# Patient Record
Sex: Male | Born: 1935 | Race: White | Hispanic: No | Smoking: Former smoker
Health system: Southern US, Community
[De-identification: ages and names within clinical notes are randomized; demographics above are authoritative.]

## PROBLEM LIST (undated history)

## (undated) DIAGNOSIS — I4891 Unspecified atrial fibrillation: Secondary | ICD-10-CM

## (undated) DIAGNOSIS — K219 Gastro-esophageal reflux disease without esophagitis: Secondary | ICD-10-CM

## (undated) DIAGNOSIS — D099 Carcinoma in situ, unspecified: Secondary | ICD-10-CM

## (undated) DIAGNOSIS — E669 Obesity, unspecified: Secondary | ICD-10-CM

## (undated) DIAGNOSIS — I10 Essential (primary) hypertension: Secondary | ICD-10-CM

## (undated) DIAGNOSIS — N189 Chronic kidney disease, unspecified: Secondary | ICD-10-CM

## (undated) DIAGNOSIS — M1990 Unspecified osteoarthritis, unspecified site: Secondary | ICD-10-CM

## (undated) DIAGNOSIS — K5790 Diverticulosis of intestine, part unspecified, without perforation or abscess without bleeding: Secondary | ICD-10-CM

## (undated) DIAGNOSIS — I519 Heart disease, unspecified: Secondary | ICD-10-CM

## (undated) DIAGNOSIS — N529 Male erectile dysfunction, unspecified: Secondary | ICD-10-CM

## (undated) DIAGNOSIS — I2510 Atherosclerotic heart disease of native coronary artery without angina pectoris: Secondary | ICD-10-CM

## (undated) HISTORY — DX: Gastro-esophageal reflux disease without esophagitis: K21.9

## (undated) HISTORY — DX: Obesity, unspecified: E66.9

## (undated) HISTORY — DX: Carcinoma in situ, unspecified: D09.9

## (undated) HISTORY — DX: Chronic kidney disease, unspecified: N18.9

## (undated) HISTORY — DX: Male erectile dysfunction, unspecified: N52.9

## (undated) HISTORY — DX: Unspecified atrial fibrillation: I48.91

## (undated) HISTORY — PX: TONSILLECTOMY: SUR1361

## (undated) HISTORY — DX: Atherosclerotic heart disease of native coronary artery without angina pectoris: I25.10

## (undated) HISTORY — DX: Diverticulosis of intestine, part unspecified, without perforation or abscess without bleeding: K57.90

## (undated) HISTORY — DX: Heart disease, unspecified: I51.9

## (undated) HISTORY — DX: Essential (primary) hypertension: I10

---

## 1998-02-19 ENCOUNTER — Ambulatory Visit (HOSPITAL_COMMUNITY): Admission: RE | Admit: 1998-02-19 | Discharge: 1998-02-19 | Payer: Self-pay | Admitting: Gastroenterology

## 2000-07-11 ENCOUNTER — Inpatient Hospital Stay (HOSPITAL_COMMUNITY): Admission: EM | Admit: 2000-07-11 | Discharge: 2000-07-15 | Payer: Self-pay | Admitting: Emergency Medicine

## 2000-07-11 ENCOUNTER — Encounter: Payer: Self-pay | Admitting: Emergency Medicine

## 2000-07-18 ENCOUNTER — Ambulatory Visit (HOSPITAL_COMMUNITY): Admission: RE | Admit: 2000-07-18 | Discharge: 2000-07-18 | Payer: Self-pay | Admitting: Cardiology

## 2000-07-18 ENCOUNTER — Encounter: Payer: Self-pay | Admitting: Cardiology

## 2000-08-07 ENCOUNTER — Ambulatory Visit (HOSPITAL_COMMUNITY): Admission: RE | Admit: 2000-08-07 | Discharge: 2000-08-07 | Payer: Self-pay | Admitting: Cardiology

## 2000-08-07 ENCOUNTER — Encounter: Payer: Self-pay | Admitting: Cardiology

## 2000-10-26 ENCOUNTER — Ambulatory Visit (HOSPITAL_COMMUNITY): Admission: RE | Admit: 2000-10-26 | Discharge: 2000-10-26 | Payer: Self-pay | Admitting: Internal Medicine

## 2002-01-17 ENCOUNTER — Encounter: Admission: RE | Admit: 2002-01-17 | Discharge: 2002-04-17 | Payer: Self-pay | Admitting: Family Medicine

## 2002-12-17 ENCOUNTER — Ambulatory Visit (HOSPITAL_COMMUNITY): Admission: RE | Admit: 2002-12-17 | Discharge: 2002-12-17 | Payer: Self-pay | Admitting: Internal Medicine

## 2003-01-05 ENCOUNTER — Inpatient Hospital Stay (HOSPITAL_COMMUNITY): Admission: EM | Admit: 2003-01-05 | Discharge: 2003-01-06 | Payer: Self-pay | Admitting: Emergency Medicine

## 2003-01-05 ENCOUNTER — Encounter: Payer: Self-pay | Admitting: Emergency Medicine

## 2003-01-09 ENCOUNTER — Ambulatory Visit (HOSPITAL_COMMUNITY): Admission: RE | Admit: 2003-01-09 | Discharge: 2003-01-10 | Payer: Self-pay | Admitting: Internal Medicine

## 2003-01-09 HISTORY — PX: PACEMAKER INSERTION: SHX728

## 2003-01-10 ENCOUNTER — Encounter: Payer: Self-pay | Admitting: Internal Medicine

## 2004-08-06 ENCOUNTER — Ambulatory Visit: Payer: Self-pay

## 2004-11-03 ENCOUNTER — Ambulatory Visit: Payer: Self-pay | Admitting: Internal Medicine

## 2005-01-18 ENCOUNTER — Ambulatory Visit: Payer: Self-pay | Admitting: Internal Medicine

## 2005-04-25 ENCOUNTER — Ambulatory Visit: Payer: Self-pay | Admitting: Internal Medicine

## 2005-07-22 ENCOUNTER — Ambulatory Visit: Payer: Self-pay | Admitting: Internal Medicine

## 2005-11-09 ENCOUNTER — Ambulatory Visit: Payer: Self-pay | Admitting: Internal Medicine

## 2006-01-24 ENCOUNTER — Ambulatory Visit: Payer: Self-pay | Admitting: Internal Medicine

## 2006-02-22 ENCOUNTER — Ambulatory Visit: Payer: Self-pay | Admitting: Internal Medicine

## 2006-03-31 ENCOUNTER — Ambulatory Visit: Payer: Self-pay | Admitting: Internal Medicine

## 2006-05-19 ENCOUNTER — Ambulatory Visit: Payer: Self-pay | Admitting: Internal Medicine

## 2006-05-25 ENCOUNTER — Ambulatory Visit: Payer: Self-pay | Admitting: Family Medicine

## 2006-06-19 ENCOUNTER — Ambulatory Visit: Payer: Self-pay | Admitting: Internal Medicine

## 2006-06-29 ENCOUNTER — Ambulatory Visit: Payer: Self-pay | Admitting: Family Medicine

## 2006-08-14 ENCOUNTER — Ambulatory Visit: Payer: Self-pay | Admitting: Internal Medicine

## 2006-08-29 ENCOUNTER — Ambulatory Visit: Payer: Self-pay | Admitting: Family Medicine

## 2006-09-11 ENCOUNTER — Ambulatory Visit: Payer: Self-pay | Admitting: Internal Medicine

## 2006-10-09 ENCOUNTER — Ambulatory Visit: Payer: Self-pay | Admitting: Internal Medicine

## 2006-11-06 ENCOUNTER — Ambulatory Visit: Payer: Self-pay | Admitting: Internal Medicine

## 2006-12-05 ENCOUNTER — Ambulatory Visit: Payer: Self-pay | Admitting: Internal Medicine

## 2007-01-01 ENCOUNTER — Ambulatory Visit: Payer: Self-pay | Admitting: Internal Medicine

## 2007-01-29 ENCOUNTER — Ambulatory Visit: Payer: Self-pay | Admitting: Internal Medicine

## 2007-02-26 ENCOUNTER — Ambulatory Visit: Payer: Self-pay | Admitting: Internal Medicine

## 2007-03-26 ENCOUNTER — Ambulatory Visit: Payer: Self-pay | Admitting: Internal Medicine

## 2007-04-26 ENCOUNTER — Ambulatory Visit: Payer: Self-pay | Admitting: Internal Medicine

## 2007-05-21 ENCOUNTER — Ambulatory Visit: Payer: Self-pay | Admitting: Internal Medicine

## 2007-06-18 ENCOUNTER — Ambulatory Visit: Payer: Self-pay | Admitting: Internal Medicine

## 2007-07-18 ENCOUNTER — Ambulatory Visit: Payer: Self-pay | Admitting: Internal Medicine

## 2007-08-08 ENCOUNTER — Ambulatory Visit: Payer: Self-pay | Admitting: Family Medicine

## 2007-08-13 ENCOUNTER — Ambulatory Visit: Payer: Self-pay | Admitting: Internal Medicine

## 2007-09-09 ENCOUNTER — Ambulatory Visit: Payer: Self-pay | Admitting: Internal Medicine

## 2007-10-08 ENCOUNTER — Ambulatory Visit: Payer: Self-pay | Admitting: Internal Medicine

## 2007-11-06 ENCOUNTER — Ambulatory Visit: Payer: Self-pay | Admitting: Internal Medicine

## 2008-01-07 ENCOUNTER — Ambulatory Visit: Payer: Self-pay | Admitting: Internal Medicine

## 2008-04-07 ENCOUNTER — Ambulatory Visit: Payer: Self-pay | Admitting: Internal Medicine

## 2008-06-11 ENCOUNTER — Ambulatory Visit: Payer: Self-pay | Admitting: Family Medicine

## 2008-07-08 ENCOUNTER — Ambulatory Visit: Payer: Self-pay | Admitting: Internal Medicine

## 2008-09-10 ENCOUNTER — Ambulatory Visit: Payer: Self-pay | Admitting: Family Medicine

## 2008-09-22 ENCOUNTER — Ambulatory Visit: Payer: Self-pay | Admitting: Family Medicine

## 2008-09-30 ENCOUNTER — Ambulatory Visit: Payer: Self-pay | Admitting: Family Medicine

## 2008-10-21 ENCOUNTER — Encounter: Payer: Self-pay | Admitting: Internal Medicine

## 2008-11-04 ENCOUNTER — Ambulatory Visit: Payer: Self-pay | Admitting: Internal Medicine

## 2008-12-22 ENCOUNTER — Ambulatory Visit: Payer: Self-pay | Admitting: Family Medicine

## 2008-12-24 ENCOUNTER — Ambulatory Visit: Payer: Self-pay | Admitting: Family Medicine

## 2008-12-26 DIAGNOSIS — R55 Syncope and collapse: Secondary | ICD-10-CM

## 2008-12-26 DIAGNOSIS — I498 Other specified cardiac arrhythmias: Secondary | ICD-10-CM | POA: Insufficient documentation

## 2008-12-26 DIAGNOSIS — Z950 Presence of cardiac pacemaker: Secondary | ICD-10-CM

## 2009-02-02 ENCOUNTER — Ambulatory Visit: Payer: Self-pay | Admitting: Family Medicine

## 2009-02-03 ENCOUNTER — Ambulatory Visit: Payer: Self-pay | Admitting: Internal Medicine

## 2009-04-07 ENCOUNTER — Ambulatory Visit: Payer: Self-pay | Admitting: Family Medicine

## 2009-04-13 ENCOUNTER — Encounter: Payer: Self-pay | Admitting: Internal Medicine

## 2009-04-13 ENCOUNTER — Ambulatory Visit: Payer: Self-pay

## 2009-06-02 ENCOUNTER — Ambulatory Visit: Payer: Self-pay | Admitting: Internal Medicine

## 2009-06-30 ENCOUNTER — Ambulatory Visit: Payer: Self-pay | Admitting: Internal Medicine

## 2009-07-28 ENCOUNTER — Ambulatory Visit: Payer: Self-pay | Admitting: Internal Medicine

## 2009-09-01 ENCOUNTER — Ambulatory Visit: Payer: Self-pay | Admitting: Internal Medicine

## 2009-09-29 ENCOUNTER — Ambulatory Visit: Payer: Self-pay | Admitting: Internal Medicine

## 2009-10-19 ENCOUNTER — Ambulatory Visit: Payer: Self-pay | Admitting: Internal Medicine

## 2009-11-02 ENCOUNTER — Encounter: Payer: Self-pay | Admitting: Internal Medicine

## 2009-11-05 ENCOUNTER — Telehealth (INDEPENDENT_AMBULATORY_CARE_PROVIDER_SITE_OTHER): Payer: Self-pay | Admitting: *Deleted

## 2009-11-09 ENCOUNTER — Telehealth: Payer: Self-pay | Admitting: Internal Medicine

## 2009-11-27 ENCOUNTER — Ambulatory Visit: Payer: Self-pay | Admitting: Internal Medicine

## 2009-11-30 LAB — CONVERTED CEMR LAB
BUN: 19 mg/dL (ref 6–23)
Basophils Absolute: 0 10*3/uL (ref 0.0–0.1)
Basophils Relative: 0.8 % (ref 0.0–3.0)
CO2: 30 meq/L (ref 19–32)
Calcium: 8.9 mg/dL (ref 8.4–10.5)
Chloride: 107 meq/L (ref 96–112)
Creatinine, Ser: 1.6 mg/dL — ABNORMAL HIGH (ref 0.4–1.5)
Eosinophils Absolute: 0.6 10*3/uL (ref 0.0–0.7)
Eosinophils Relative: 11.8 % — ABNORMAL HIGH (ref 0.0–5.0)
GFR calc non Af Amer: 44.79 mL/min (ref >60)
Glucose, Bld: 90 mg/dL (ref 70–99)
HCT: 43.2 % (ref 39.0–52.0)
Hemoglobin: 14.2 g/dL (ref 13.0–17.0)
INR: 2.7 — ABNORMAL HIGH (ref 0.8–1.0)
Lymphocytes Relative: 25.5 % (ref 12.0–46.0)
Lymphs Abs: 1.3 10*3/uL (ref 0.7–4.0)
MCHC: 32.8 g/dL (ref 30.0–36.0)
MCV: 97 fL (ref 78.0–100.0)
Monocytes Absolute: 0.5 10*3/uL (ref 0.1–1.0)
Monocytes Relative: 9.8 % (ref 3.0–12.0)
Neutro Abs: 2.7 10*3/uL (ref 1.4–7.7)
Neutrophils Relative %: 52.1 % (ref 43.0–77.0)
Platelets: 134 10*3/uL — ABNORMAL LOW (ref 150.0–400.0)
Potassium: 4 meq/L (ref 3.5–5.1)
Prothrombin Time: 27.3 s — ABNORMAL HIGH (ref 9.1–11.7)
RBC: 4.45 M/uL (ref 4.22–5.81)
RDW: 13.3 % (ref 11.5–14.6)
Sodium: 143 meq/L (ref 135–145)
WBC: 5.1 10*3/uL (ref 4.5–10.5)
aPTT: 36.8 s — ABNORMAL HIGH (ref 21.7–28.8)

## 2009-12-04 ENCOUNTER — Ambulatory Visit: Payer: Self-pay | Admitting: Internal Medicine

## 2009-12-04 ENCOUNTER — Ambulatory Visit (HOSPITAL_COMMUNITY): Admission: RE | Admit: 2009-12-04 | Discharge: 2009-12-04 | Payer: Self-pay | Admitting: Internal Medicine

## 2009-12-07 ENCOUNTER — Encounter: Payer: Self-pay | Admitting: Internal Medicine

## 2009-12-23 ENCOUNTER — Encounter: Payer: Self-pay | Admitting: Internal Medicine

## 2009-12-23 ENCOUNTER — Ambulatory Visit: Payer: Self-pay

## 2010-03-16 ENCOUNTER — Ambulatory Visit: Payer: Self-pay | Admitting: Internal Medicine

## 2010-05-21 ENCOUNTER — Ambulatory Visit: Payer: Self-pay | Admitting: Family Medicine

## 2010-06-01 ENCOUNTER — Ambulatory Visit: Payer: Self-pay | Admitting: Family Medicine

## 2010-07-09 ENCOUNTER — Encounter (INDEPENDENT_AMBULATORY_CARE_PROVIDER_SITE_OTHER): Payer: Self-pay | Admitting: *Deleted

## 2010-10-12 NOTE — Miscellaneous (Signed)
Summary: dx code correction  Clinical Lists Changes  Problems: Changed problem from PACEMAKER (ICD-V45.Marland Kitchen01) to PACEMAKER, PERMANENT (ICD-V45.01)  changed the incorrect dx code to correct dx code Genella Mech  July 09, 2010 10:57 AM

## 2010-10-12 NOTE — Procedures (Signed)
Summary: eph./ gd   Current Medications (verified): 1)  Diltiazem Hcl Er Beads 120 Mg Xr24h-Cap (Diltiazem Hcl Er Beads) .... Take One Capsule By Mouth Twice A Day 2)  Benazepril-Hydrochlorothiazide 20-12.5 Mg Tabs (Benazepril-Hydrochlorothiazide) .... Take 1 Tablet By Mouth Once A Day 3)  Warfarin Sodium 5 Mg Tabs (Warfarin Sodium) .... Use As Directed By Anticoagulation Clinic 4)  Allopurinol 300 Mg Tabs (Allopurinol) .... Take 1 Tablet By Mouth Once A Day As Needed Gout 5)  Doxazosin Mesylate 4 Mg Tabs (Doxazosin Mesylate) .... Take 1 Tablet By Mouth Once A Day 6)  Travatan 0.004 % Soln (Travoprost) .Marland Kitchen.. 1 Drop in Each Eye At Night 7)  Alphagan P 0.15 % Soln (Brimonidine Tartrate) .Marland Kitchen.. 1 Drop in Left Eye Twice Daily 8)  Simvastatin 20 Mg Tabs (Simvastatin) .... Take One Tablet By Mouth Daily At Bedtime 9)  Multivitamins   Tabs (Multiple Vitamin) .... Take 1 Tablet By Mouth Once A Day 10)  Vitamin D 2000 Unit Tabs (Cholecalciferol) .Marland Kitchen.. 1 Tab Once Daily  Allergies (verified): No Known Drug Allergies   PPM Specifications Following MD:  Lewayne Bunting, MD     Referring MD:  LITTLE PPM Vendor:  St Jude     PPM Model Number:  [id number]     PPM Serial Number:  [id number] PPM DOI:  12/04/2009     PPM Implanting MD:  Lewayne Bunting, MD  Lead 1    Location: RV     DOI: 01/09/2003     Model #: 1488TC     Serial #: [id number]     Status: active  Magnet Response Rate:  BOL98.6 ERI  86.3  Indications:  Syncope: A-fib  Explantation Comments:  12/04/09 St. Jude [id number] explanted.  PPM Follow Up Remote Check?  No Battery Voltage:  3.04 V     Battery Est. Longevity:  13 years     Pacer Dependent:  No     Right Ventricle  Amplitude: 12 mV, Impedance: 540 ohms, Threshold: 1.0 V at 0.4 msec  Episodes Coumadin:  Yes Ventricular Pacing:  51%  Parameters Mode:  VVI     Lower Rate Limit:  50     Next Cardiology Appt Due:  03/12/2010 Tech Comments:  Auto capture programmed on.  Steri strips removed.   No  redness or edema noted.  ROV 3months with Dr. Ladona Ridgel. Altha Harm, LPN  December 23, 2009 10:19 AM  MD Comments:  Agree with above.

## 2010-10-12 NOTE — Progress Notes (Signed)
Summary: req to speak to nurse  Phone Note Call from Patient Call back at [phone]   Caller: Patient Reason for Call: Talk to Nurse Summary of Call: request to speak to nurse Initial call taken by: Migdalia Dk,  November 09, 2009 9:52 AM  Follow-up for Phone Call        requested a copy of EKG to be faxed to Dr Clarene Duke.  Will have it faxed today for his apt 11/23/09 Dennis Bast, RN, BSN  November 09, 2009 10:05 AM

## 2010-10-12 NOTE — Assessment & Plan Note (Signed)
Summary: pc2   Visit Type:  Follow-up Primary Provider:  Sharlot Gowda, md   History of Present Illness: [name] returns today for followup.  He is very pleasant male with chronic atrial fibrillation, chronic Coumadin therapy, and syncope who was found to have bradycardia and with permanent pacemaker insertion back in 2004.  He has had no recurrent syncope since his pacemaker was placed.  The patient denies chest pain or shortness of breath and overall has been stable.  He is s/p PPM generator change.   Current Medications (verified): 1)  Diltiazem Hcl Er Beads 120 Mg Xr24h-Cap (Diltiazem Hcl Er Beads) .... Take One Capsule By Mouth Twice A Day 2)  Benazepril-Hydrochlorothiazide 20-12.5 Mg Tabs (Benazepril-Hydrochlorothiazide) .... Take 1 Tablet By Mouth Once A Day 3)  Warfarin Sodium 5 Mg Tabs (Warfarin Sodium) .... Use As Directed By Anticoagulation Clinic 4)  Allopurinol 300 Mg Tabs (Allopurinol) .... Take 1 Tablet By Mouth Once A Day As Needed Gout 5)  Doxazosin Mesylate 4 Mg Tabs (Doxazosin Mesylate) .... Take 1 Tablet By Mouth Once A Day 6)  Travatan 0.004 % Soln (Travoprost) .Marland Kitchen.. 1 Drop in Each Eye At Night 7)  Alphagan P 0.15 % Soln (Brimonidine Tartrate) .Marland Kitchen.. 1 Drop in Left Eye Twice Daily 8)  Simvastatin 20 Mg Tabs (Simvastatin) .... Take One Tablet By Mouth Daily At Bedtime 9)  Multivitamins   Tabs (Multiple Vitamin) .... Take 1 Tablet By Mouth Once A Day 10)  Vitamin D 2000 Unit Tabs (Cholecalciferol) .Marland Kitchen.. 1 Tab Once Daily  Allergies (verified): No Known Drug Allergies  Past History:  Past Medical History: Last updated: 12/26/2008 SYNCOPE (ICD-780.2) BRADYCARDIA (ICD-427.89) ATRIAL FIBRILLATION (ICD-427.31) PACEMAKER (ICD-V45.Marland Kitchen01)    Past Surgical History: Last updated: 12/26/2008 pacemaker -- 01/09/03 -- Doylene Canning. Ladona Ridgel, M.D  Review of Systems  The patient denies chest pain, syncope, dyspnea on exertion, and peripheral edema.    Vital Signs:  Patient  profile:   77 year old male Height:      65 inches Weight:      211 pounds BMI:     35.24 Pulse rate:   60 / minute BP sitting:   130 / 82  Vitals Entered By: Laurance Flatten CMA (March 16, 2010 9:00 AM)  Physical Exam  General:  Well developed, well nourished, in no acute distress.  HEENT: normal Neck: supple. No JVD. Carotids 2+ bilaterally no bruits Cor: IRIRR no rubs, gallops or murmur Lungs: CTA. Well healed PPM incision. Ab: soft, nontender. nondistended. No HSM. Good bowel sounds Ext: warm. no cyanosis, clubbing or edema Neuro: alert and oriented. Grossly nonfocal. affect pleasant    PPM Specifications Following MD:  Lewayne Bunting, MD     Referring MD:  LITTLE PPM Vendor:  St Jude     PPM Model Number:  [id number]     PPM Serial Number:  [id number] PPM DOI:  12/04/2009     PPM Implanting MD:  Lewayne Bunting, MD  Lead 1    Location: RV     DOI: 01/09/2003     Model #: 1488TC     Serial #: [id number]     Status: active  Magnet Response Rate:  BOL98.6 ERI  86.3  Indications:  Syncope: A-fib  Explantation Comments:  12/04/09 St. Jude [id number]/[id number] explanted.  PPM Follow Up Remote Check?  No Battery Voltage:  3.01 V     Battery Est. Longevity:  12.4 years     Pacer Dependent:  No     Right  Ventricle  Amplitude: 12 mV, Impedance: 510 ohms, Threshold: 1.0 V at 0.4 msec  Episodes Coumadin:  Yes Ventricular Pacing:  48%  Parameters Mode:  VVI     Lower Rate Limit:  50     Tech Comments:  Auto-capture off..  RV reprogrammed 2.0@0 .4.  Checked by industry.  ROV 3/12 with Dr. Ladona Ridgel. Altha Harm, LPN  March 16, 346 9:22 AM  MD Comments:  Normal device function.  Impression & Recommendations:  Problem # 1:  ATRIAL FIBRILLATION (ICD-427.31) His ventricular rate is well controlled.  Continue coumadin. His updated medication list for this problem includes:    Warfarin Sodium 5 Mg Tabs (Warfarin sodium) ..... Use as directed by anticoagulation clinic  Problem # 2:  PACEMAKER  (ICD-V45.Marland Kitchen01) Normal device function.  Will recheck in several months.

## 2010-10-12 NOTE — Letter (Signed)
Summary: Implantable Device Instructions  [facility], Main Office  [address]   [place], [place] [address]   Phone: [phone]  Fax: [phone]      Implantable Device Instructions  You are scheduled for:   __ Generator Change  on 12/04/09 with Dr.Nalla Purdy.  1.  Please arrive at the Short Stay Center at [facility] at 5:30am on the day of your procedure.  2.  Do not eat or drink after midnight the night before your procedure.  3.  Complete lab work on 11/27/09.  The lab at [facility] is open from 8:30 AM to 1:30 PM and from 2:30 PM to 5:00 PM.  You do not have to be fasting.  4.    Take your last dose of Coumadin on-will call if you need to stop.  5.  Plan for an overnight stay.  Bring your insurance cards and a list of your medications.  6.  Wash your chest and neck with antibacterial soap (any brand) the evening before and the morning of your procedure.  Rinse well.   *If you have ANY questions after you get home, please call the office [phone].  Anselm Pancoast  *Every attempt is made to prevent procedures from being rescheduled.  Due to the nauture of Electrophysiology, rescheduling can happen.  The physician is always aware and directs the staff when this occurs.

## 2010-10-12 NOTE — Cardiovascular Report (Signed)
Summary: Office Visit   Office Visit   Imported By: Roderic Ovens 12/28/2009 13:04:26  _____________________________________________________________________  External Attachment:    Type:   Image     Comment:   External Document

## 2010-10-12 NOTE — Cardiovascular Report (Signed)
Summary: TTM   TTM   Imported By: Roderic Ovens 11/09/2009 13:34:56  _____________________________________________________________________  External Attachment:    Type:   Image     Comment:   External Document

## 2010-10-12 NOTE — Miscellaneous (Signed)
Summary: Device change out  Clinical Lists Changes  Observations: Added new observation of PPM DOI: 12/04/2009 (12/07/2009 12:17) Added new observation of PPM SERL#: [id number]  (12/07/2009 12:17) Added new observation of PPM MODL#: [id number]  (12/07/2009 78:29) Added new observation of PPMEXPLCOMM: 12/04/09 St. Jude 5160/[id number] explanted.  (12/07/2009 12:17)      PPM Specifications Following MD:  Lewayne Bunting, MD     Referring MD:  LITTLE PPM Vendor:  St Jude     PPM Model Number:  [id number]     PPM Serial Number:  [id number] PPM DOI:  12/04/2009     PPM Implanting MD:  Lewayne Bunting, MD  Lead 1    Location: RV     DOI: 01/09/2003     Model #: 1488TC     Serial #: [id number]     Status: active  Magnet Response Rate:  BOL98.6 ERI  86.3  Indications:  Syncope: A-fib  Explantation Comments:  12/04/09 St. Jude [id number]/[id number] explanted.  PPM Follow Up Pacer Dependent:  No      Episodes Coumadin:  Yes  Parameters Mode:  VVI     Lower Rate Limit:  50

## 2010-10-12 NOTE — Cardiovascular Report (Signed)
Summary: Pre Op Orders   Pre Op Orders   Imported By: Roderic Ovens 11/04/2009 15:35:47  _____________________________________________________________________  External Attachment:    Type:   Image     Comment:   External Document

## 2010-10-12 NOTE — Cardiovascular Report (Signed)
Summary: TTM   TTM   Imported By: Roderic Ovens 10/07/2009 10:40:56  _____________________________________________________________________  External Attachment:    Type:   Image     Comment:   External Document

## 2010-10-12 NOTE — Progress Notes (Signed)
  Faxed LOV,12 lead over to [facility] w/ Dr.Little's office @ [phone] [facility]  November 05, 2009 1:36 PM

## 2010-10-12 NOTE — Assessment & Plan Note (Signed)
Summary: 1 YR F/U   Visit Type:  1 yr f/u   History of Present Illness: [name] returns today for followup.  He is very pleasant male with chronic atrial fibrillation, chronic Coumadin therapy, and syncope who was found to have bradycardia and with permanent pacemaker insertion back in 2004.  He has had no recurrent syncope since his pacemaker was placed.  The patient denies chest pain or shortness of breath and overall has been stable.    Current Medications (verified): 1)  Diltiazem Hcl Er Beads 120 Mg Xr24h-Cap (Diltiazem Hcl Er Beads) .... Take One Capsule By Mouth Twice A Day 2)  Benazepril-Hydrochlorothiazide 20-12.5 Mg Tabs (Benazepril-Hydrochlorothiazide) .... Take 1 Tablet By Mouth Once A Day 3)  Warfarin Sodium 5 Mg Tabs (Warfarin Sodium) .... Use As Directed By Anticoagulation Clinic 4)  Allopurinol 300 Mg Tabs (Allopurinol) .... Take 1 Tablet By Mouth Once A Day As Needed Gout 5)  Doxazosin Mesylate 4 Mg Tabs (Doxazosin Mesylate) .... Take 1 Tablet By Mouth Once A Day 6)  Travatan 0.004 % Soln (Travoprost) .Marland Kitchen.. 1 Drop in Each Eye At Night 7)  Alphagan P 0.15 % Soln (Brimonidine Tartrate) .Marland Kitchen.. 1 Drop in Left Eye Twice Daily 8)  Simvastatin 20 Mg Tabs (Simvastatin) .... Take One Tablet By Mouth Daily At Bedtime 9)  Multivitamins   Tabs (Multiple Vitamin) .... Take 1 Tablet By Mouth Once A Day 10)  Vitamin D 2000 Unit Tabs (Cholecalciferol) .Marland Kitchen.. 1 Tab Once Daily  Allergies (verified): No Known Drug Allergies  Past History:  Past Medical History: Last updated: 12/26/2008 SYNCOPE (ICD-780.2) BRADYCARDIA (ICD-427.89) ATRIAL FIBRILLATION (ICD-427.31) PACEMAKER (ICD-V45.Marland Kitchen01)    Past Surgical History: Last updated: 12/26/2008 pacemaker -- 01/09/03 -- Doylene Canning. Ladona Ridgel, M.D  Review of Systems  The patient denies chest pain, syncope, dyspnea on exertion, and peripheral edema.    Vital Signs:  Patient profile:   77 year old male Height:      65 inches Weight:      214  pounds BMI:     35.74 Pulse rate:   57 / minute Pulse rhythm:   regular BP sitting:   141 / 81  (left arm) Cuff size:   large  Vitals Entered By: Danielle Rankin, CMA (October 19, 2009 3:12 PM)  Physical Exam  General:  Well developed, well nourished, in no acute distress.  HEENT: normal Neck: supple. No JVD. Carotids 2+ bilaterally no bruits Cor: IRIRR no rubs, gallops or murmur Lungs: CTA. Well healed PPM incision. Ab: soft, nontender. nondistended. No HSM. Good bowel sounds Ext: warm. no cyanosis, clubbing or edema Neuro: alert and oriented. Grossly nonfocal. affect pleasant    EKG  Procedure date:  10/19/2009  Findings:      Atrial fibrillation with a controlled ventricular response rate of: 51.Ventricle is paced.    PPM Specifications Following MD:  Mark Bunting, MD     Referring MD:  LITTLE PPM Vendor:  St Jude     PPM Model Number:  [id number]     PPM Serial Number:  [id number] PPM DOI:  01/09/2003     PPM Implanting MD:  Mark Bunting, MD  Lead 1    Location: RV     DOI: 01/09/2003     Model #: 1488TC     Serial #: [id number]     Status: active  Magnet Response Rate:  BOL98.6 ERI  86.3  Indications:  Syncope: A-fib  Explantation Comments:  TTM's with [employer]  PPM Follow Up Remote  Check?  No Battery Voltage:  2.5 V     Battery Est. Longevity:  ERI     Pacer Dependent:  No     Right Ventricle  Amplitude: 14.8 mV, Impedance: 331 ohms, Threshold: 1.0 V at 0.4 msec  Episodes Coumadin:  Yes Ventricular Pacing:  18%  Parameters Mode:  VVI     Lower Rate Limit:  50     Tech Comments:  Device @ ERI  10/19/2009.  He will be set up for change out. Altha Harm, LPN  October 19, 2009 3:36 PM  MD Comments:  Agree with above.  Impression & Recommendations:  Problem # 1:  PACEMAKER (ICD-V45.Marland Kitchen01) His device has reached ERI.  Will schedule for PPM gen change.  Problem # 2:  ATRIAL FIBRILLATION (ICD-427.31) His rate has been well controlled.  Continue current meds. His updated  medication list for this problem includes:    Warfarin Sodium 5 Mg Tabs (Warfarin sodium) ..... Use as directed by anticoagulation clinic

## 2010-11-09 ENCOUNTER — Encounter (INDEPENDENT_AMBULATORY_CARE_PROVIDER_SITE_OTHER): Payer: Self-pay | Admitting: *Deleted

## 2010-11-18 NOTE — Letter (Signed)
Summary: Appointment - Reschedule  [employer], Main Office  [address]   [place], [place] [address]   Phone: [phone]  Fax: [phone]     November 09, 2010 MRN: [id number]   [name] [address] [place], [place]  [address]   Dear Mr. Eklund,   Due to a change in our office schedule, your appointment on  11-17-10  at  3:00p             must be changed.  It is very important that we reach you to reschedule this appointment. We look forward to participating in your health care needs. Please contact us at the number listed above at your earliest convenience to reschedule this appointment.     Sincerely,  Glass blower/designer

## 2010-11-24 ENCOUNTER — Encounter: Payer: Self-pay | Admitting: Internal Medicine

## 2010-12-01 ENCOUNTER — Ambulatory Visit (INDEPENDENT_AMBULATORY_CARE_PROVIDER_SITE_OTHER): Payer: Self-pay | Admitting: *Deleted

## 2010-12-01 DIAGNOSIS — I4891 Unspecified atrial fibrillation: Secondary | ICD-10-CM

## 2010-12-03 LAB — PROTIME-INR: Prothrombin Time: 26.9 seconds — ABNORMAL HIGH (ref 11.6–15.2)

## 2011-01-25 NOTE — Assessment & Plan Note (Signed)
[facility]                         ELECTROPHYSIOLOGY OFFICE NOTE   HALIL, RENTZ                       MRN:          [id number]  DATE:11/04/2008                            DOB:          Sep 25, 1930    HISTORY OF PRESENT ILLNESS:  Mr. Choquette returns today for followup.  He  is very pleasant male with chronic atrial fibrillation, chronic Coumadin  therapy, and syncope who was found to have bradycardia and with  permanent pacemaker insertion back in 2004.  He has had no recurrent  syncope since his pacemaker was placed.  The patient denies chest pain  or shortness of breath and overall has been stable.   CURRENT MEDICATIONS:  Include  1. Coumadin as directed.  2. Potassium supplements as directed.  3. Travatan eye drops.  4. Diltiazem 120 twice a day.  5. Doxazosin 4 mg.  6. Vytorin 10/20 daily.  7. Multivitamin.   PHYSICAL EXAMINATION:  GENERAL:  He is a pleasant, well-appearing man in  no distress.  VITAL SIGNS:  Blood pressure today was 127/81, the pulse was 58 and  regular, and respirations were 18.  NECK:  Revealed no jugular venous distention.  LUNGS:  Clear bilaterally to auscultation.  No wheezes, rales, or  rhonchi are present.  CARDIOVASCULAR:  Irregular rhythm with normal S1 and S2.  ABDOMEN:  Soft and nontender.  EXTREMITIES:  Demonstrated no edema.   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  R-  waves were 13.  The impedance 348 and the threshold 0.625 at 0.4.  Battery voltage was 2.7 volts.  His estimated longevity on his device  was between 1 and 2 years.  He was 18% V-pacing.   IMPRESSION:  1. Symptomatic bradycardia.  2. Chronic atrial fibrillation.  3. Status post pacemaker insertion.   DISCUSSION:  Mr. Jupin is stable.  Pacemaker is working normally.  He  does have some dyspnea, but I think this is appropriate for his age and  underlying medical problems, as it  occurs typically with  exertion.  He is scheduled to see Dr. Clarene Duke who  is his primary cardiologist.  I will see him back in 1-year unless his  symptoms change.     Doylene Canning. Ladona Ridgel, MD  Electronically Signed    GWT/MedQ  DD: 11/04/2008  DT: 11/05/2008  Job #: [id number]   cc:   Thereasa Solo. Little, M.D.

## 2011-01-25 NOTE — Assessment & Plan Note (Signed)
[facility]                         ELECTROPHYSIOLOGY OFFICE NOTE   [name], [name]                       MRN:          [id number]  DATE:11/06/2007                            DOB:          1931/05/06    The patient returns today for follow-up.  He is a very pleasant 77-year-  old man with chronic atrial fibrillation and syncope who is status post  pacemaker insertion secondary to bradycardia.  He returns today for  follow-up.  He has been well.  He notes that his blood pressure has been  under pretty good control.  He can walk on level ground without  difficulty.  He does get a little short of breath when he walks up a  grade or gets into a big hurry.  Otherwise no specific complaints.   MEDICATIONS:  1. Coumadin as directed.  2. Cardizem 120 mg a day.  3. Potassium.  4. Benazepril.  5. Vytorin.   PHYSICAL EXAMINATION:  GENERAL:  He is a pleasant, well-appearing man in  no distress.  VITAL SIGNS:  Blood pressure 150/90, pulse 86 and irregular,  respirations 18, and weight was 212 pounds.  NECK:  No jugular venous distention.  LUNGS:  Clear bilaterally to auscultation.  No wheezes, rales, or  rhonchi were present.  CARDIOVASCULAR:  Irregularly irregular rhythm with normal S1 and S2.  EXTREMITIES:  No edema.   Interrogation of his pacemaker demonstrates an Integrity [phone] with R  waves of 12, impedance of 436, threshold 0.625 at 0.5.  The battery  voltage was 2.73 volts.  Estimated longevity was 2-1/2 years.  He was  17% V-paced.   IMPRESSION:  1. Symptomatic bradycardia.  2. Atrial fibrillation.  3. Status post pacemaker insertion.   DISCUSSION:  Overall the patient is stable.  His pacemaker is working  normally and we will see him back in one year for pacemaker follow-up.     Doylene Canning. Ladona Ridgel, MD  Electronically Signed   GWT/MedQ  DD: 11/06/2007  DT: 11/06/2007  Job #: [id number]   cc:   Thereasa Solo. Little, M.D.

## 2011-01-28 NOTE — Discharge Summary (Signed)
[name]. [facility]  Patient:    [name], [name]                       MRN: [id number] Adm. Date:  [id number] Disc. Date: [id number] Attending:  Loreli Dollar CC:         Reuben Likes, M.D.   Discharge Summary  ADMITTING DIAGNOSIS:  Syncope.  DISCHARGE DIAGNOSES 1. Syncope. 2. Atrial fibrillation. 3. Nonsustained ventricular tachycardia. 4. Hypertension. 5. Abrasion, face and hip. 6. Hypokalemia.  COMPLICATIONS:  None.  DISCHARGE STATUS:  Stable.  HISTORY OF PRESENT ILLNESS:  [name] is a 77 year old male who presented to the emergency room after having had a syncopal episode at home.  At that time, he had lacerated his hip and face and had rather significant discoloration of his chin and anterior neck.  He states he stood up, took a few steps and had loss of consciousness.  Apparently, this had happened once before about six months ago, after the setting of having had dental work done.  He denied any chest pain, shortness of breath, PND or orthopnea.  PAST HISTORY:  His past history included hypertension for 20 years, no prior cardiac problems, glaucoma bilaterally, tonsillectomy.  OUTPATIENT MEDICATIONS 1. Lotrel 5/20 mg. 2. Cardura 4 mg. 3. Hydrochlorothiazide 25 mg. 4. Enteric-coated aspirin. 5. Eye drops.  FAMILY HISTORY:  Positive for stroke and hypertension in his mother.  Father died at age 58 of an MI.  SOCIAL HISTORY:  Patient is married.  Occasional alcohol.  No tobacco. Appropriate low-salt, slight-increase-in-fat, minimal-cholesterol diet. Exercises infrequently but is adequate.  PHYSICAL EXAMINATION:  Pulse 85 and irregular, respirations 18, temperature 97, 163/97, 97% on room air.  Soft tissue swelling of his lower lip with discoloration.  All teeth were intact.  LUNGS:  Clear.  CARDIAC:  Systolic murmur 1/6.  Irregular rhythm.  No orthostatic changes.  ABDOMEN:  Soft, nontender.  EXTREMITIES:  No peripheral  edema.  NECK:  Negative carotid bruits.  HOSPITAL COURSE:  Patient was initially admitted by Dr. Aram Candela. Tysinger to my service.  He was started on Cardizem, Coumadin and IV heparin.  His hypertension was well-controlled.  In the emergency room, he had been given a total of Cardizem 45 mg IV, but was not started on a Cardizem drip; the heparin was also held secondary to the trauma of his lip, hip and knee.  His cardiac enzymes were negative for myocardial infarction.  The initial concern is that his atrial fibrillation was associated with his syncope; however, he developed an episode of nonsustained ventricular tachycardia of about ______ beats in duration, completely asymptomatic with this.  At this same setting, his potassium was slightly low at 3.1.  His hypokalemia was replaced and he had no further ventricular tachycardia.  A 2-D echo showed normal left ventricular systolic function and a normal left atrial size.  He was ambulating in the hall, monitored without any recurrent significant arrhythmias other than atrial fibrillation with controlled ventricular response.  He was started on Coumadin, with plans for a therapeutic INR to result in his ability to be discharged.  By July 15, 2000, his INR was 2.5, his renal functions were normal, his potassium was in the normal range and he had had no more episodes of nonsustained ventricular tachycardia.  At this point, the working assumption was still that his syncope was related to atrial fibrillation.  During in his hospitalization, he was placed on Cardizem for  control of his ventricular response and on low-dose beta blockers.  With the addition of the beta blockers, he developed a slow ventricular response and the beta blockers were discontinued.  Because he had normal left ventricular systolic function, I did not feel that he needed an EP study.  SPECIAL INSTRUCTIONS:  The plans were for him to have an outpatient  Cardiolite study at [facility] two days after admission.  He is instructed not to drive a vehicle for six months.  He is not to take any aspirin while taking Coumadin.  DIET:  He is to stay on a low-salt, low-fat diet.  DISCHARGE MEDICATIONS 1. Coumadin 5 mg once a day. 2. Cardizem CD 120 mg one b.i.d. 3. Hydrochlorothiazide 25 mg a day. 4. K-Dur 20 mEq once a day. 5. Cardura 4 mg at bedtime. 6. Lotensin 20 mg a day.  PLAN:  If he had a negative cardiac evaluation for ischemia, the plans were for the patient to undergo elective cardioversion, once he had been anticoagulated for at least three to four weeks.  We had no sure record as to how long he had been in atrial fibrillation and he was unaware of the arrhythmias, even while he was in atrial fibrillation in the hospital. DD:  09/11/00 TD:  09/11/00 Job: [id number]

## 2011-01-28 NOTE — H&P (Signed)
[name], [name]                          ACCOUNT NO.:  [id number]   MEDICAL RECORD NO.:  [id number]                   PATIENT TYPE:  INP   LOCATION:  2020                                 FACILITY:  [facility]   PHYSICIAN:  Nanetta Batty, M.D.                DATE OF BIRTH:  November 02, 1930   DATE OF ADMISSION:  DATE OF DISCHARGE:                                HISTORY & PHYSICAL   CHIEF COMPLAINT:  Syncope at church.   HISTORY OF PRESENT ILLNESS:  The patient is a 77 year old male with a  history of syncope in the past. He has chronic atrial fibrillation and  hypertension. He has no history of coronary disease. He had a negative  Cardiolite study and essentially normal left ventricular function by echo  after he was evaluated for syncope in October 2001.   The patient just had a loop recorder removed this month by Dr. Lewayne Bunting.  He unfortunately had no syncope while the recorder was in place. It is  reported that he had periods of decreased heart rate into the 20s and 30s,  but he was asymptomatic and these were transient. Apparently these happened  in the early morning hours when the patient was  asleep.   Today at church he suddenly became diaphoretic. He got  up to go outside and  then sat down on the steps and the laid down. He had no loss of  consciousness. When EMS arrived his heart rate was 72, blood pressure 70/40.  He had no chest pain, no palpitations prior to  this event.   PAST MEDICAL HISTORY:  1. Hypertension.  2. Chronic atrial fibrillation.  3. Status post tonsillectomy in the past.   CURRENT MEDICATIONS:  1. Coumadin 5 mg a day except none on Sundays.  2. Potassium 10 mEq a day.  3. Cardizem 120 b.i.d.  4. Cardura 5 mg q.h.s.  5. Lotensin 20 mg a day.   ALLERGIES:  No known drug allergies.   SOCIAL HISTORY:  He is married. He is a nonsmoker. He has 2 children and 2  grandchildren.   FAMILY HISTORY:  Remarkable for coronary disease. His father died at  72 of  an MI. His mother has a history of hypertension and a prior stroke.   REVIEW OF SYSTEMS:  There is no  history of GI bleeding or peptic ulcer  disease. He denies any melena. He has not prostate trouble or renal disease.  He has not had chest pain or unusual dyspnea. He has had no recent fever or  chills or illness.   PHYSICAL EXAMINATION:  VITAL SIGNS:  Blood pressure 104/73, pulse 72,  respirations 12.  GENERAL:  He is a well developed, well nourished male in no acute distress.  HEENT:  Normocephalic. He does wear glasses.  NECK:  Without bruits or JVD.  CHEST:  Clear to auscultation and percussion. His recorder site  is without  drainage or hematoma. Steri-Strips are  in place.  CARDIAC:  Regular rate and rhythm, no murmurs, rubs, gallops, normal S1, S2.  ABDOMEN:  Nontender, no hepatosplenomegaly.  EXTREMITIES:  No edema. Pulses are 2+/4 without tremor or bruits.  NEUROLOGIC:  Grossly intact. He is awake, alert, oriented. He moves all  moves all extremities without obvious deficit.   LABORATORY DATA:  His EKG shows atrial fibrillation with controlled  ventricular response. He does have a Q-wave in V2. Labs are pending.   IMPRESSION:  1. Syncope and collapse.  2. Chronic atrial fibrillation with documented slow rates in the past.  3. Status post loop recorder explant December 17, 2002.  4. Treated hypertension.  5. Coumadin therapy.  6. Past history of negative Cardiolite study and normal left ventricular     function in October 2001.   PLAN:  Will admit to telemetry. Will hold his Coumadin and check labs. Will  discuss with Dr. Clarene Duke tomorrow. The patient may require permanent  pacemaker.     [name], P.A.                      Nanetta Batty, M.D.    [name]  D:  01/05/2003  T:  01/06/2003  Job:  [id number]   cc:   Thereasa Solo. Little, M.D.  [address]  [place] [address]  Fax: [phone]

## 2011-01-28 NOTE — Op Note (Signed)
[name], [name]                          ACCOUNT NO.:  [id number]   MEDICAL RECORD NO.:  [id number]                   PATIENT TYPE:  OIB   LOCATION:  4727                                 FACILITY:  [facility]   PHYSICIAN:  Doylene Canning. Ladona Ridgel, M.D.               DATE OF BIRTH:  29-Oct-1930   DATE OF PROCEDURE:  01/09/2003  DATE OF DISCHARGE:                                 OPERATIVE REPORT   PROCEDURE:  Insertion of a single chamber pacemaker.   INDICATIONS FOR PROCEDURE:  Recurrent syncope, chronic atrial fibrillation,  with documented heart rates in the 20's and low 30's.   INTRODUCTION:  The patient is a 77 year old man with a history of chronic  atrial fibrillation.  He has a history of recurrent syncopal episodes and  for this reason, underwent insertion of an implantable loop recorder  approximately two years ago.  While he had his loop recorder for nearly two  years, the patient had no recurrent syncopal episodes.  He did have  documented heart rates in the 20's and low 30's at times, but he denied  symptoms associated with this.  Approximately one month ago, the patient was  admitted for implantable loop recorder removal without difficulty and  approximately one week ago while in church, he had a recurrent syncopal  spell.  Because of his prior documented bradycardia (severe) and his history  of recurrent syncope in a setting of chronic atrial fibrillation, normal LV  function, and nonobstructive coronary artery disease, he is now referred for  permanent pacemaker insertion.   DESCRIPTION OF PROCEDURE:  After informed consent was obtained, the patient  was taken to the diagnostic EP lab in the fasted state.  After the usual  preparation and draping, intravenous fentanyl and midazolam were given for  sedation. A total of 30 mL of lidocaine was infiltrated into the left  infraclavicular region. A 5 cm incision was carried out over this region and  electrocautery utilized to  dissect down to the subpectoralis fascia.  10 mL  of contrast demonstrated a patent left subclavian vein when injected through  the left upper extremity.  The vein was subsequently punctured and the St.  Jude model 1488 52 cm active fixation pacing lead, serial number [id number] was  placed in the right ventricle.  Mapping was carried out in the right  ventricle and the R waves there measured 18 millivolts.  When the lead was  actively fixed, the patient's threshold was 0.5 volts at 0.5 milliseconds  with a pacing impedence of 770 ohms.  10 volt pacing demonstrated no  evidence of diaphragmatic stimulation.  With the lead in satisfactory  condition, it was secured to the subpectoralis fascia with a figure-of-eight  silk suture.  The sew-in sleeve was also secured with silk suture.  Electrocautery was utilized to make a subcutaneous pocket.  Kanamycin  irrigation was utilized to irrigate the pocket.  The St. Jude Integrity  AVXSR single chamber pacemaker, serial number [id number] model 5160 was  connected to the pacing lead and placed in the subcutaneous pocket.  The  generator was secured with a silk suture.  Kanamycin irrigation was utilized  to irrigate the incision and the incision was then closed with a layer of 2-  0 Vicryl followed by a layer of 3-0 Vicryl followed by a layer of 4-0  Vicryl. Benzoin was painted on the skin, Steri-Strips were applied, and a  pressure dressing was placed, and the patient returned to his room in  satisfactory condition.   COMPLICATIONS:  There were no immediate procedure complications.    RESULTS:  This demonstrates successful implantation of a St. Jude single  chamber pacemaker in a patient with chronic atrial fibrillation and  documented symptomatic bradycardia.                                               Doylene Canning. Ladona Ridgel, M.D.    GWT/MEDQ  D:  01/09/2003  T:  01/09/2003  Job:  [id number]   cc:   Thereasa Solo. Little, M.D.  [address]   [place] [address]  Fax: [phone]   Sharlot Gowda, M.D.  [address]  [place], [place] [address]  Fax: [phone]   Kathrine Cords, R.N. [facility]

## 2011-01-28 NOTE — Op Note (Signed)
   NAMECZAR, YSAGUIRRE                          ACCOUNT NO.:  [id number]   MEDICAL RECORD NO.:  [id number]                   PATIENT TYPE:  OIB   LOCATION:  2852                                 FACILITY:  [facility]   PHYSICIAN:  Doylene Canning. Ladona Ridgel, M.D. [facility]           DATE OF BIRTH:  [birth year]   DATE OF PROCEDURE:  12/17/2002  DATE OF DISCHARGE:                                 OPERATIVE REPORT   PROCEDURE PERFORMED:  Removal of an implantable loop recorder.   INDICATIONS FOR PROCEDURE:  The implantable loop recorder is at end of life.   INTRODUCTION:  The patient is a 77 year old man with a history of  unexplained syncope and chronic atrial fibrillation and chronic Coumadin  therapy.  He underwent insertion of an implantable loop recorder just over  two years ago secondary to unexplained syncope.  He has had documented  bradycardia with heart rates typically in the 40s and 50s although he has  had documented bradycardia with heart rates in the lower 30s and low 20s  transiently but with no sustained episodes and no symptoms most importantly.  Because his device is now at end of life, he is brought in for device  removal.   DESCRIPTION OF PROCEDURE:  After informed consent was obtained, the patient  was taken to the diagnostic cardiac catheterization lab in a fasted state.  After the usual preparation and draping, intravenous fentanyl and Midazolam  was given for sedation.  A total of 20 ml of lidocaine was infiltrated over  the old insertion site and a 3 cm incision was carried out over this region  and electrocautery utilized to dissect down to the previously inserted loop  recorder.  It was removed with gentle traction.  Kanamycin irrigation was  utilized to irrigate the incision and the incision was closed with a layer  of 2-0 Vicryl followed by a layer of 3-0 Vicryl followed by a layer of 4-0  Vicryl.  Benzoin was painted on the skin.  Steri-Strips were applied.  A  pressure dressing  was placed and the patient returned to his room in  satisfactory condition.   COMPLICATIONS:  There were no immediate procedure complications.   RESULTS:  This demonstrated successful removal of a Medtronic implantable  loop recorder which was found to be at end of life, without any immediate  procedural complications.                                               Doylene Canning. Ladona Ridgel, M.D. [facility]    GWT/MEDQ  D:  12/17/2002  T:  12/17/2002  Job:  [id number]   cc:   Sharlot Gowda, M.D.  [address]  [place], [place] [address]  Fax: [phone]   Kathrine Cords, R.N. [facility]

## 2011-01-28 NOTE — Discharge Summary (Signed)
[name], [name]                          ACCOUNT NO.:  [id number]   MEDICAL RECORD NO.:  [id number]                   PATIENT TYPE:  INP   LOCATION:  2020                                 FACILITY:  [facility]   PHYSICIAN:  Thereasa Solo. Little, M.D.              DATE OF BIRTH:  05/25/1931   DATE OF ADMISSION:  01/05/2003  DATE OF DISCHARGE:  01/06/2003                                 DISCHARGE SUMMARY   ADMISSION DIAGNOSES:  1. Syncope.  2. Hypertension.  3. Chronic atrial fibrillation.  4. Status post loop recorder.   DISCHARGE DIAGNOSES:  1. Near syncope.  2. Hypotension probable cause of #1.  3. Chronic atrial fibrillation.  4. History of hypertension.   PROCEDURE:  None.   CONDITION ON DISCHARGE:  Stable and improved.   HISTORY OF PRESENT ILLNESS:  This 77 year old male has history of syncope  back in 2001.  Also history of chronic atrial fibrillation and hypertension.  He had a normal Cardiolite study and normal LV function by 2-D echo after  that syncopal episode in 2001.  He had a loop recorder placed and actually  wore the loop recorder for 18 to 21 months.  This was placed again after his  episode of syncope.  It was just removed a week prior to this admission.  Only episodes noted were some periods of bradycardia where the heart rate  flopped into the 20s and 30s, however, the patient was asleep and was  completely asymptomatic.  No other episodes.   On the morning of admission he was at church.  He suddenly became  diaphoretic.  He denied any discomfort or shortness of breath.  He thought  he would go outside to get some air.  Upon getting up, he became very  lightheaded, sat down and then lay down.  He had no loss of consciousness.  EMS was contacted.  Upon their arrival, his blood pressure was 70/40 with a  heart rate of 72.  The patient had no other symptomatology prior to the  event.  He was transported to the emergency room for further evaluation.   PHYSICAL EXAMINATION ON ADMISSION:  GENERAL APPEARANCE:  He was conscious  and alert.  He is in no acute distress with no complaints.  VITAL SIGNS:  Blood pressure 104/73, heart rate 72, respiratory rate 12, he  was afebrile.  O2 saturations were 100% on room air.  SKIN:  Warm and dry.  LUNGS:  Clear.  CARDIOVASCULAR:  Irregular rhythm.  There was no murmur or rub.  ABDOMEN:  Benign with normal bowel sounds.  EXTREMITIES:  No edema.  Pulses were +2 bilaterally.  He had no vascular  bruit.  NEUROLOGY:  He had no neurological deficits.   EKG showed atrial fibrillation with a heart rate 73.   Chest x-ray showed no active disease.   ADMISSION LABORATORY DATA:  Normal CBC, PT/INR, CMP, and urinalysis.  Cardiac enzymes were negative.   HOSPITAL COURSE:  The patient was admitted to be monitored on telemetry for  arrhythmias and vital signs as well as cycle cardiac enzymes to rule out  ischemia.   The next day, he had no complaints.  Repeat labs were all normal.  Cardiac  enzymes were negative x3.  He had no further complaints.  Dr. Ladona Ridgel was  asked to consult secondary to removal of loop recorder 10 days previously.  There was a question as to whether he may need a pacemaker with episodes of  bradycardia, however, again he was asleep and completely asymptomatic.   Dr. Ladona Ridgel saw the patient on January 06, 2003.  He had no further  recommendations other than hold his Cardura which could possibly have caused  him to be orthostatic.  He will, however, be having a VVIR pacemaker  placement as an outpatient with Dr. Ladona Ridgel.   The patient was discharged home on January 06, 2003, without further incident.   DISCHARGE MEDICATIONS:  1. Cardizem 120 mg b.i.d.  2. Lotensin 20 mg daily.  3. The patient was instructed not to take his Coumadin or his Cardura.   DISCHARGE INSTRUCTIONS:  He is not to drive until he sees Dr. Ladona Ridgel.  He is  not to undergo any heavy lifting or vigorous activity.    DIET:  He is to maintain a low sodium diet.   FOLLOW UP:  He is to return to short stay section C on January 09, 2003, at 9  a.m. for permanent placement with Dr. Ladona Ridgel.  He will follow up with Dr.  Clarene Duke a few weeks following the procedure.     Adrian Saran, N.P.                        Thereasa Solo. Little, M.D.    HB/MEDQ  D:  01/29/2003  T:  01/29/2003  Job:  [id number]   cc:   Doylene Canning. Ladona Ridgel, M.D.

## 2011-01-28 NOTE — Procedures (Signed)
[place]. [facility]  Patient:    [name], [name]                       MRN: [id number] Proc. Date: 10/26/00 Adm. Date:  [id number] Attending:  Lewayne Bunting CC:         Thereasa Solo. Little, M.D.   Procedure Report  PROCEDURE:  Implantation of a Medtronic implantable looper monitor.  INDICATION:  Recurrent syncope.  INTRODUCTION:  The patient is a very pleasant 77 year old man with a history of atrial fibrillation and syncope, who was referred for evaluation.  In addition, the patient has normal left ventricular systolic function and a history of nonsustained VT.  He is now referred for implantable monitoring secondary to unexplained syncope.  DESCRIPTION OF PROCEDURE:  After informed consent was obtained, the patient was taken to the diagnostic EP lab in a fasting state.  After the usual preparation and draping, approximately 25 cc of lidocaine was infiltrated into the left subpectoral region approximately 2.5-3 cm below the clavicle and left of his midsternal line.  A 3 cm incision was carried out over this region. Electrocautery utilized to dissect down to the subpectoralis fascia.  Kelly clamps were then used to bluntly dissect soft tissue away from the fascia. The wound was irrigated with kanamycin.  The Medtronic implantable looper monitor was placed into the created pocket and secured with silk suture. Irrigation was again delivered to the pocket, and the pocket was closed with a layer of 2-0 Vicryl and 4-0 Vicryl.  It should be noted that the device was secured to the fascia with silk suture.  At this point, benzoin was painted on the skin and Steri-Strips were applied and a pressure dressing placed, and the patient returned to his room in good condition.  COMPLICATIONS:  None.  RESULTS:  This demonstrates successful implantation of a Medtronic implantable looper monitor in a patient with normal left ventricular function and coronary artery  disease, status post coronary artery bypass graft, with atrial fibrillation. DD:  10/26/00 TD:  10/26/00 Job: [id number]

## 2011-01-28 NOTE — Discharge Summary (Signed)
   [name], [name]                          ACCOUNT NO.:  [id number]   MEDICAL RECORD NO.:  [id number]                   PATIENT TYPE:  OIB   LOCATION:  4727                                 FACILITY:  [facility]   PHYSICIAN:  Carolanne Grumbling, M.D.                 DATE OF BIRTH:  1931/08/14   DATE OF ADMISSION:  01/09/2003  DATE OF DISCHARGE:  01/10/2003                                 DISCHARGE SUMMARY   PRIMARY DIAGNOSES:  1. Syncope.  2. Bradycardia.   HISTORY OF PRESENT ILLNESS:  This is a 77 year old gentleman with a past  medical history of unexplained syncope, status post internal loop recorder  with no recurrent syncope documented.  The patient developed bradycardia  with heart rate in the 40s and was now admitted for placement of permanent  pacemaker after a repeat syncopal episode.   HOSPITAL COURSE:  The patient was admitted, underwent placement of a St.  Jude pacemaker, single chamber.  He tolerated the procedure well.  He had no  immediate postoperative complications.  He remained in A-fib with occasional  ventricular pacing.  He tolerated the procedure well, had no immediate  postoperative complications and was discharged to home the following day in  stable condition.   DISCHARGE PLAN:  1. The patient was to resume his home medications, which include Cardizem CD     120 b.i.d., Coumadin 5 daily except Sunday none, Lotensin 20 daily, K-Dur     10  daily and Tylenol 1-2 tablets every 4-6 hours as needed.  2. He was given activity and wound care pacemaker discharge sheets.  3. Low-fat, low-salt, low-cholesterol diet.  4. The patient was to have a PT/INR done on Monday.  5.     He was to be seen at [facility] on Jan 27, 2003 at 9:30 a.m. and     Dr. Ladona Ridgel on April 17, 2003 at 11:45.  6. He was instructed he was not able to drive for the next 3 months until     after seen by Dr. Ladona Ridgel.     Chinita Pester, C.R.N.P. [facility]                 Carolanne Grumbling, M.D.    DS/MEDQ  D:  01/10/2003  T:  01/10/2003  Job:  [id number]   cc:   Thereasa Solo. Little, M.D.  [address]  [place] [address]  Fax: [phone]   Carolanne Grumbling, M.D.  Fax: [phone]   [facility]

## 2011-04-15 ENCOUNTER — Encounter: Payer: Self-pay | Admitting: Family Medicine

## 2011-05-31 ENCOUNTER — Encounter: Payer: Medicare Other | Admitting: Internal Medicine

## 2011-07-12 ENCOUNTER — Encounter: Payer: Self-pay | Admitting: Internal Medicine

## 2011-07-12 ENCOUNTER — Ambulatory Visit (INDEPENDENT_AMBULATORY_CARE_PROVIDER_SITE_OTHER): Payer: Medicare Other | Admitting: Internal Medicine

## 2011-07-12 DIAGNOSIS — R55 Syncope and collapse: Secondary | ICD-10-CM

## 2011-07-12 DIAGNOSIS — I4891 Unspecified atrial fibrillation: Secondary | ICD-10-CM

## 2011-07-12 DIAGNOSIS — Z950 Presence of cardiac pacemaker: Secondary | ICD-10-CM

## 2011-07-12 DIAGNOSIS — I498 Other specified cardiac arrhythmias: Secondary | ICD-10-CM

## 2011-07-12 LAB — PACEMAKER DEVICE OBSERVATION
BATTERY VOLTAGE: 2.993 V
DEVICE MODEL PM: 2313421
RV LEAD IMPEDENCE PM: 487.5 Ohm

## 2011-07-12 NOTE — Assessment & Plan Note (Signed)
He has been asymptomatic. We'll continue a period of watchful waiting.

## 2011-07-12 NOTE — Progress Notes (Signed)
HPI [name] returns today for followup. He is a 77 year old man with a history of symptomatic atrial fibrillation, unexplained syncope, status post permanent pacemaker insertion. The patient has done well since his pacemaker was placed many years ago. He has had no recurrent syncope. He denies chest pain, shortness of breath, palpitations, or peripheral edema. No Known Allergies   Current Outpatient Prescriptions  Medication Sig Dispense Refill  . benazepril-hydrochlorthiazide (LOTENSIN HCT) 20-12.5 MG per tablet Take 1 tablet by mouth daily.        Marland Kitchen diltiazem (CARDIZEM CD) 120 MG 24 hr capsule Take 120 mg by mouth 2 (two) times daily.        Marland Kitchen doxazosin (CARDURA) 4 MG tablet Take 4 mg by mouth at bedtime.        Marland Kitchen latanoprost (XALATAN) 0.005 % ophthalmic solution Place 1 drop into both eyes at bedtime.        . Multiple Vitamin (MULTI VITAMIN MENS PO) Take by mouth.        . potassium chloride SA (K-DUR,KLOR-CON) 20 MEQ tablet Take 20 mEq by mouth daily. 1/2 po daily       . simvastatin (ZOCOR) 20 MG tablet Take 20 mg by mouth at bedtime.        . Vitamins A & D (VITAMIN A & D) 5000-400 UNITS CAPS Take by mouth.        . warfarin (COUMADIN) 5 MG tablet Take 5 mg by mouth daily. As directed          Past Medical History  Diagnosis Date  . Hypertension   . GERD (gastroesophageal reflux disease)   . Diverticulosis   . Gout   . Obesity   . ED (erectile dysfunction)   . Chronic kidney disease     RENAL INSUFFICIENCY  . Glaucoma   . Heart disorder     ROS:   All systems reviewed and negative except as noted in the HPI.   Past Surgical History  Procedure Date  . Pacemaker insertion 01/09/03     Family History  Problem Relation Age of Onset  . Heart disease Mother   . Hypertension Mother   . Kidney disease Mother   . Stroke Mother   . Heart disease Father      History   Social History  . Marital Status: Married    Spouse Name: N/A    Number of Children: 2  .  Years of Education: N/A   Occupational History  . Not on file.   Social History Main Topics  . Smoking status: Never Smoker   . Smokeless tobacco: Not on file  . Alcohol Use: Not on file  . Drug Use: Not on file  . Sexually Active: Not on file   Other Topics Concern  . Not on file   Social History Narrative  . No narrative on file     BP 120/70  Pulse 66  Ht 5\' 6"  (1.676 m)  Wt 204 lb 1.9 oz (92.588 kg)  BMI 32.95 kg/m2  Physical Exam:  Well appearing NAD HEENT: Unremarkable Neck:  No JVD, no thyromegally Lymphatics:  No adenopathy Back:  No CVA tenderness Lungs:  Clear no wheezes, rales, or rhonchi. Well-healed pacemaker incision. HEART:  Iregular rate rhythm, no murmurs, no rubs, no clicks Abd:  soft, positive bowel sounds, no organomegally, no rebound, no guarding Ext:  2 plus pulses, no edema, no cyanosis, no clubbing Skin:  No rashes no nodules Neuro:  CN II through XII  intact, motor grossly intact  DEVICE  Normal device function.  See PaceArt for details.   Assess/Plan:

## 2011-07-12 NOTE — Assessment & Plan Note (Signed)
He is asymptomatic. He'll continue his current medical therapy.

## 2011-07-12 NOTE — Patient Instructions (Signed)
Your physician wants you to follow-up in: 6 months with device clinic and 12 months with Dr Taylor You will receive a reminder letter in the mail two months in advance. If you don't receive a letter, please call our office to schedule the follow-up appointment.  

## 2011-07-12 NOTE — Assessment & Plan Note (Signed)
His device is working normally. We'll plan to recheck in several months. 

## 2011-07-26 ENCOUNTER — Encounter: Payer: Self-pay | Admitting: Family Medicine

## 2011-07-26 ENCOUNTER — Ambulatory Visit (INDEPENDENT_AMBULATORY_CARE_PROVIDER_SITE_OTHER): Payer: Medicare Other | Admitting: Family Medicine

## 2011-07-26 VITALS — BP 142/90 | HR 62 | Ht 64.25 in | Wt 205.0 lb

## 2011-07-26 DIAGNOSIS — E669 Obesity, unspecified: Secondary | ICD-10-CM

## 2011-07-26 DIAGNOSIS — Z23 Encounter for immunization: Secondary | ICD-10-CM

## 2011-07-26 DIAGNOSIS — M109 Gout, unspecified: Secondary | ICD-10-CM

## 2011-07-26 DIAGNOSIS — Z950 Presence of cardiac pacemaker: Secondary | ICD-10-CM

## 2011-07-26 DIAGNOSIS — I4891 Unspecified atrial fibrillation: Secondary | ICD-10-CM

## 2011-07-26 DIAGNOSIS — H409 Unspecified glaucoma: Secondary | ICD-10-CM

## 2011-07-26 DIAGNOSIS — M25562 Pain in left knee: Secondary | ICD-10-CM

## 2011-07-26 DIAGNOSIS — M25569 Pain in unspecified knee: Secondary | ICD-10-CM

## 2011-07-26 DIAGNOSIS — L989 Disorder of the skin and subcutaneous tissue, unspecified: Secondary | ICD-10-CM

## 2011-07-26 DIAGNOSIS — E785 Hyperlipidemia, unspecified: Secondary | ICD-10-CM

## 2011-07-26 DIAGNOSIS — I10 Essential (primary) hypertension: Secondary | ICD-10-CM

## 2011-07-26 DIAGNOSIS — H269 Unspecified cataract: Secondary | ICD-10-CM | POA: Insufficient documentation

## 2011-07-26 DIAGNOSIS — N289 Disorder of kidney and ureter, unspecified: Secondary | ICD-10-CM

## 2011-07-26 LAB — CBC WITH DIFFERENTIAL/PLATELET
Basophils Absolute: 0.1 10*3/uL (ref 0.0–0.1)
Basophils Relative: 1 % (ref 0–1)
Hemoglobin: 14.9 g/dL (ref 13.0–17.0)
MCHC: 32.6 g/dL (ref 30.0–36.0)
Neutro Abs: 4.5 10*3/uL (ref 1.7–7.7)
Neutrophils Relative %: 63 % (ref 43–77)
Platelets: 158 10*3/uL (ref 150–400)
RDW: 14.5 % (ref 11.5–15.5)

## 2011-07-26 LAB — LIPID PANEL
Cholesterol: 120 mg/dL (ref 0–200)
LDL Cholesterol: 66 mg/dL (ref 0–99)
Total CHOL/HDL Ratio: 3.3 Ratio
Triglycerides: 88 mg/dL (ref <150)
VLDL: 18 mg/dL (ref 0–40)

## 2011-07-26 LAB — COMPREHENSIVE METABOLIC PANEL
ALT: 17 U/L (ref 0–53)
Alkaline Phosphatase: 78 U/L (ref 39–117)
CO2: 30 mEq/L (ref 19–32)
Creat: 1.63 mg/dL — ABNORMAL HIGH (ref 0.50–1.35)
Glucose, Bld: 82 mg/dL (ref 70–99)
Total Bilirubin: 0.8 mg/dL (ref 0.3–1.2)

## 2011-07-26 NOTE — Progress Notes (Signed)
Subjective:    Patient ID: [name], male    DOB: 1930-12-24, 77 y.o.   MRN: [id number]  HPI He is here for an interval evaluation. He does complain of a several month history of left knee pain to the medial aspect. This is bothering him when he goes upstairs. He complains of limited flexion. He also says that the knee will pop. It is giving way but has never locked. He also has a lesion present on his left hand on the dorsal surface of the would like evaluated. He continues to be followed by cardiology and is on chronic Coumadin. He has a history of gout but is not on allopurinol and has had no gout attacks. He does see his ophthalmologist and has a history of cataracts as well as glaucoma and was told to take a multivitamin to help with his site. He is retired and does work part-time at [employer]. His marriage of several decades is still quite strong. He does not smoke and rarely drinks. He does stay active but is not involved in an exercise program .   Review of Systems He did except as above    Objective:   Physical Exam BP 142/90  Pulse 62  Ht 5' 4.25" (1.632 m)  Wt 205 lb (92.987 kg)  BMI 34.91 kg/m2  General Appearance:    Alert, cooperative, no distress, appears stated age  Head:    Normocephalic, without obvious abnormality, atraumatic  Eyes:    PERRL, conjunctiva/corneas clear, EOM's intact, fundi    benign  Ears:    Normal TM's and external ear canals  Nose:   Nares normal, mucosa normal, no drainage or sinus   tenderness  Throat:   Lips, mucosa, and tongue normal; teeth and gums normal  Neck:   Supple, no lymphadenopathy;  thyroid:  no   enlargement/tenderness/nodules; no carotid   bruit or JVD  Back:    Spine nontender, no curvature, ROM normal, no CVA     tenderness  Lungs:     Clear to auscultation bilaterally without wheezes, rales or     ronchi; respirations unlabored  Chest Wall:    No tenderness or deformity   Heart:    Regular rate and rhythm, S1 and S2  normal, no murmur, rub   or gallop  Breast Exam:    No chest wall tenderness, masses or gynecomastia  Abdomen:     Soft, non-tender, nondistended, normoactive bowel sounds,    no masses, no hepatosplenomegaly  Genitalia:   deferred   Rectal:   deferred   Extremities:   No clubbing, cyanosis or edema  Pulses:   2+ and symmetric all extremities  Skin:   Skin color, texture, turgor normal, no rashes or lesions.an erythematous slightly dry lesion of approximately 1/2 cm is noted on the dorsum of the hand mid position.   Lymph nodes:   Cervical, supraclavicular, and axillary nodes normal  Neurologic:   CNII-XII intact, normal strength, sensation and gait; reflexes 2+ and symmetric throughout          Psych:   Normal mood, affect, hygiene and grooming.           Assessment & Plan:   1. Left knee pain    2. Hand lesion    3. Atrial fibrillation  CBC with Differential, Comprehensive metabolic panel, Lipid panel  4. PACEMAKER, PERMANENT    5. Hypertension  CBC with Differential, Comprehensive metabolic panel, Lipid panel  6. Hyperlipidemia LDL goal <70  Lipid panel  7. Gout    8. Glaucoma    9. Cataracts, bilateral    10. Obesity (BMI 30-39.9)    11. Renal insufficiency     I discussed evaluation and treatment of his knee discussing MRI versus CT and possible referral. An MRI would be difficult if not impossible due to his pacer. He would like to hold off any further evaluation of this. He will return for biopsy of the hand lesion. He is to continue on all his other medications and call if any troubles. Flu shot given.

## 2011-08-15 ENCOUNTER — Telehealth: Payer: Self-pay | Admitting: Internal Medicine

## 2011-08-15 NOTE — Telephone Encounter (Signed)
Sent in referral to [facility] to Hydrographic surveyor per [employer]

## 2011-08-15 NOTE — Telephone Encounter (Signed)
Send him to a Hydrographic surveyor

## 2011-09-16 ENCOUNTER — Telehealth: Payer: Self-pay

## 2011-09-16 NOTE — Telephone Encounter (Signed)
Did you see this? 

## 2011-09-16 NOTE — Telephone Encounter (Signed)
Pt called and wanted to know why he was sent to [facility] 40.00 and 2 hrs later they are sending him to [facility] told him that I would ask you and call him back please advise

## 2011-12-14 ENCOUNTER — Ambulatory Visit (INDEPENDENT_AMBULATORY_CARE_PROVIDER_SITE_OTHER): Payer: Medicare Other | Admitting: *Deleted

## 2011-12-14 ENCOUNTER — Encounter: Payer: Self-pay | Admitting: Internal Medicine

## 2011-12-14 DIAGNOSIS — R55 Syncope and collapse: Secondary | ICD-10-CM

## 2011-12-14 DIAGNOSIS — Z950 Presence of cardiac pacemaker: Secondary | ICD-10-CM

## 2011-12-14 DIAGNOSIS — I4891 Unspecified atrial fibrillation: Secondary | ICD-10-CM

## 2011-12-14 LAB — PACEMAKER DEVICE OBSERVATION
BRDY-0002RV: 60 {beats}/min
RV LEAD AMPLITUDE: 6 mv
RV LEAD IMPEDENCE PM: 525 Ohm

## 2011-12-14 NOTE — Progress Notes (Signed)
Pacer check in clinic  

## 2012-02-28 ENCOUNTER — Encounter: Payer: Self-pay | Admitting: Family Medicine

## 2012-02-28 ENCOUNTER — Ambulatory Visit (INDEPENDENT_AMBULATORY_CARE_PROVIDER_SITE_OTHER): Payer: Medicare Other | Admitting: Family Medicine

## 2012-02-28 ENCOUNTER — Encounter: Payer: Medicare Other | Admitting: Family Medicine

## 2012-02-28 VITALS — BP 124/76 | HR 64 | Wt 213.0 lb

## 2012-02-28 DIAGNOSIS — M109 Gout, unspecified: Secondary | ICD-10-CM

## 2012-02-28 DIAGNOSIS — I4891 Unspecified atrial fibrillation: Secondary | ICD-10-CM

## 2012-02-28 MED ORDER — INDOMETHACIN 50 MG PO CAPS: 50.0000 mg | ORAL_CAPSULE | Freq: Two times a day (BID) | ORAL | Status: AC

## 2012-02-28 NOTE — Progress Notes (Signed)
  Subjective:    Patient ID: [name], male    DOB: [birth year], 78 y.o.   MRN: [id number]  HPI He is here for evaluation of difficulty with pain and swelling in the left great toe. He has a previous history of gout and at one point was on allopurinol however he stopped that several years ago. He also has an underlying history of atrial fibrillation and presently is on Coumadin.   Review of Systems     Objective:   Physical Exam Alert and in no distress. The left great toe is swollen and tender to palpation as well as with motion. X-ray does show a lesion but it does not go into the joint space.      Assessment & Plan:   1. Gouty arthritis of toe  DG Foot 2 Views Left, indomethacin (INDOCIN) 50 MG capsule  2. Atrial fibrillation     I discussed treatment of the gouty arthritis with allopurinol versus episodic care. He is not interested in going on a chronic medication would like to treat with Indocin. Also discussed use of XARELTO for his atrial fibrillation. She will discuss this with his cardiologist at the next visit.

## 2012-06-25 ENCOUNTER — Other Ambulatory Visit: Payer: Medicare Other

## 2012-06-25 DIAGNOSIS — Z23 Encounter for immunization: Secondary | ICD-10-CM

## 2012-06-25 MED ORDER — INFLUENZA VIRUS VACC SPLIT PF IM SUSP: 0.5000 mL | Freq: Once | INTRAMUSCULAR | Status: AC

## 2012-07-11 ENCOUNTER — Encounter: Payer: Self-pay | Admitting: *Deleted

## 2012-07-18 ENCOUNTER — Encounter: Payer: Self-pay | Admitting: Internal Medicine

## 2012-07-18 ENCOUNTER — Ambulatory Visit (INDEPENDENT_AMBULATORY_CARE_PROVIDER_SITE_OTHER): Payer: Medicare Other | Admitting: Internal Medicine

## 2012-07-18 VITALS — BP 118/76 | HR 65 | Ht 65.0 in | Wt 211.0 lb

## 2012-07-18 DIAGNOSIS — I10 Essential (primary) hypertension: Secondary | ICD-10-CM

## 2012-07-18 DIAGNOSIS — Z950 Presence of cardiac pacemaker: Secondary | ICD-10-CM

## 2012-07-18 DIAGNOSIS — I4891 Unspecified atrial fibrillation: Secondary | ICD-10-CM

## 2012-07-18 LAB — PACEMAKER DEVICE OBSERVATION
BRDY-0004RV: 110 {beats}/min
BRDY-0005RV: 50 {beats}/min
DEVICE MODEL PM: 2313421
VENTRICULAR PACING PM: 52

## 2012-07-18 MED ORDER — CARVEDILOL 3.125 MG PO TABS: 3.1250 mg | ORAL_TABLET | Freq: Two times a day (BID) | ORAL | Status: DC

## 2012-07-18 NOTE — Assessment & Plan Note (Signed)
His blood pressure is well controlled. He will continue his current medical therapy, try and lose weight, and maintain a low-sodium diet.

## 2012-07-18 NOTE — Assessment & Plan Note (Signed)
His ventricular rate is well controlled. He will continue his current medical therapy for now although we may have to discontinue diltiazem and exchanged for something different.

## 2012-07-18 NOTE — Addendum Note (Signed)
Addended by: Dennis Bast F on: 07/18/2012 02:25 PM   Modules accepted: Orders

## 2012-07-18 NOTE — Progress Notes (Signed)
HPI Mr. Arca returns today for followup. He is a 78 year old man with hypertension, atrial fibrillation, syncope, and bradycardia, status post permanent pacemaker insertion. In the interim, he has done well. He denies chest pain or shortness of breath. No peripheral edema. All complaint today is regarding the cost of one of his medications which has changed tiers. No Known Allergies   Current Outpatient Prescriptions  Medication Sig Dispense Refill  . benazepril-hydrochlorthiazide (LOTENSIN HCT) 20-12.5 MG per tablet Take 1 tablet by mouth daily.        Marland Kitchen diltiazem (CARDIZEM CD) 120 MG 24 hr capsule Take 120 mg by mouth 2 (two) times daily.        Marland Kitchen doxazosin (CARDURA) 4 MG tablet Take 4 mg by mouth at bedtime.        Marland Kitchen latanoprost (XALATAN) 0.005 % ophthalmic solution Place 1 drop into both eyes at bedtime.        . Multiple Vitamin (MULTI VITAMIN MENS PO) Take by mouth.        . potassium chloride SA (K-DUR,KLOR-CON) 20 MEQ tablet Take 20 mEq by mouth daily. 1/2 po daily       . simvastatin (ZOCOR) 20 MG tablet Take 20 mg by mouth at bedtime.        . Vitamins A & D (VITAMIN A & D) 5000-400 UNITS CAPS Take by mouth.        . warfarin (COUMADIN) 5 MG tablet Take 5 mg by mouth daily. As directed        No current facility-administered medications for this visit.   Facility-Administered Medications Ordered in Other Visits  Medication Dose Route Frequency Provider Last Rate Last Dose  . influenza  inactive virus vaccine (FLUZONE/FLUARIX) injection 0.5 mL  0.5 mL Intramuscular Once Joselyn Arrow, MD         Past Medical History  Diagnosis Date  . Hypertension   . GERD (gastroesophageal reflux disease)   . Diverticulosis   . Gout   . Obesity   . ED (erectile dysfunction)   . Chronic kidney disease     RENAL INSUFFICIENCY  . Glaucoma   . Heart disorder     ROS:   All systems reviewed and negative except as noted in the HPI.   Past Surgical History  Procedure Date  . Pacemaker  insertion 01/09/03     Family History  Problem Relation Age of Onset  . Heart disease Mother   . Hypertension Mother   . Kidney disease Mother   . Stroke Mother   . Heart disease Father      History   Social History  . Marital Status: Married    Spouse Name: N/A    Number of Children: 2  . Years of Education: N/A   Occupational History  . Not on file.   Social History Main Topics  . Smoking status: Never Smoker   . Smokeless tobacco: Not on file  . Alcohol Use: Not on file  . Drug Use: Not on file  . Sexually Active: Not on file   Other Topics Concern  . Not on file   Social History Narrative  . No narrative on file     BP 118/76  Pulse 65  Ht 5\' 5"  (1.651 m)  Wt 211 lb (95.709 kg)  BMI 35.11 kg/m2  Physical Exam:  Well appearing 78 year old man, NAD HEENT: Unremarkable Neck:  No JVD, no thyromegally Lungs:  Clear with no wheezes, rales, or rhonchi. HEART:  Regular rate  rhythm, no murmurs, no rubs, no clicks Abd:  soft, positive bowel sounds, no organomegally, no rebound, no guarding Ext:  2 plus pulses, no edema, no cyanosis, no clubbing Skin:  No rashes no nodules Neuro:  CN II through XII intact, motor grossly intact  DEVICE  Normal device function.  See PaceArt for details.   Assess/Plan:

## 2012-07-18 NOTE — Assessment & Plan Note (Signed)
His pacemaker is working normally. We'll plan to recheck in several months. 

## 2012-07-18 NOTE — Patient Instructions (Addendum)
Your physician wants you to follow-up in: 12 months with Dr Court Joy will receive a reminder letter in the mail two months in advance. If you don't receive a letter, please call our office to schedule the follow-up appointment.  Your physician has recommended you make the following change in your medication:  1) Stop Diltiazem 2) Start Carvedilol 3.125mg  bid

## 2012-12-29 ENCOUNTER — Encounter: Payer: Self-pay | Admitting: Pharmacist Clinician (PhC)/ Clinical Pharmacy Specialist

## 2012-12-29 DIAGNOSIS — I4891 Unspecified atrial fibrillation: Secondary | ICD-10-CM

## 2012-12-29 DIAGNOSIS — Z7901 Long term (current) use of anticoagulants: Secondary | ICD-10-CM | POA: Insufficient documentation

## 2013-01-10 ENCOUNTER — Encounter: Payer: Self-pay | Admitting: Family Medicine

## 2013-01-10 ENCOUNTER — Ambulatory Visit (INDEPENDENT_AMBULATORY_CARE_PROVIDER_SITE_OTHER): Payer: Medicare Other | Admitting: Family Medicine

## 2013-01-10 VITALS — BP 118/80 | HR 70 | Wt 207.0 lb

## 2013-01-10 DIAGNOSIS — R269 Unspecified abnormalities of gait and mobility: Secondary | ICD-10-CM

## 2013-01-10 DIAGNOSIS — Z950 Presence of cardiac pacemaker: Secondary | ICD-10-CM

## 2013-01-10 DIAGNOSIS — I4891 Unspecified atrial fibrillation: Secondary | ICD-10-CM

## 2013-01-10 DIAGNOSIS — Z79899 Other long term (current) drug therapy: Secondary | ICD-10-CM

## 2013-01-10 LAB — CBC WITH DIFFERENTIAL/PLATELET
Basophils Absolute: 0 10*3/uL (ref 0.0–0.1)
Basophils Relative: 1 % (ref 0–1)
Eosinophils Absolute: 0.5 10*3/uL (ref 0.0–0.7)
Hemoglobin: 15.2 g/dL (ref 13.0–17.0)
MCH: 30.6 pg (ref 26.0–34.0)
MCHC: 33.6 g/dL (ref 30.0–36.0)
Monocytes Relative: 10 % (ref 3–12)
Neutro Abs: 3 10*3/uL (ref 1.7–7.7)
Neutrophils Relative %: 56 % (ref 43–77)
RDW: 14.3 % (ref 11.5–15.5)

## 2013-01-10 LAB — COMPREHENSIVE METABOLIC PANEL
AST: 26 U/L (ref 0–37)
Alkaline Phosphatase: 59 U/L (ref 39–117)
BUN: 17 mg/dL (ref 6–23)
Glucose, Bld: 71 mg/dL (ref 70–99)
Sodium: 139 mEq/L (ref 135–145)
Total Bilirubin: 0.7 mg/dL (ref 0.3–1.2)
Total Protein: 6.8 g/dL (ref 6.0–8.3)

## 2013-01-10 NOTE — Progress Notes (Signed)
  Subjective:    Patient ID: [name], male    DOB: [birth year], 79 y.o.   MRN: [id number]  HPI He has had 3 episodes of tripping and falling mainly relating to inability to use his right leg. He also notes that he cannot flex and externally rotate his foot to put his socks on. No history of back pain or injury to his back. He notes it when he walks he sometimes feels unsteady. No headache, blurred vision, double vision, weakness, numbness or tingling. He does have a cardiac pacemaker and a history of atrial fibrillation. He gets routine followup with his cardiologist.   Review of Systems     Objective:   Physical Exam Alert and in no distress. Cardiac exam shows a slightly irregular rhythm.EOMI. Other cranial nerves grossly intact. Cerebellar testing showed slight unsteadiness but no lateralization. Normal finger to nose. Motor, sensory and DTRs of his lower extremities is normal. Good hip motion. No clonus       Assessment & Plan:  Gait disturbance - Plan: CBC with Differential, Comprehensive metabolic panel, CT Head Wo Contrast, CANCELED: CT Head W Wo Contrast  Atrial fibrillation - Plan: CT Head Wo Contrast, CANCELED: CT Head W Wo Contrast  PACEMAKER-[employer]  Encounter for long-term (current) use of other medications - Plan: CBC with Differential, Comprehensive metabolic panel at this time he has no true lateralizing symptoms causing all this but the fact that it is his right leg that he seems to have the most trouble with coarse further evaluation.

## 2013-01-14 ENCOUNTER — Ambulatory Visit
Admission: RE | Admit: 2013-01-14 | Discharge: 2013-01-14 | Disposition: A | Discharge status: Home / Self Care | Payer: Medicare Other | Source: Ambulatory Visit | Attending: Family Medicine | Admitting: Family Medicine

## 2013-01-14 DIAGNOSIS — R269 Unspecified abnormalities of gait and mobility: Secondary | ICD-10-CM

## 2013-01-14 DIAGNOSIS — I4891 Unspecified atrial fibrillation: Secondary | ICD-10-CM

## 2013-01-15 ENCOUNTER — Other Ambulatory Visit: Payer: Self-pay

## 2013-01-15 DIAGNOSIS — R930 Abnormal findings on diagnostic imaging of skull and head, not elsewhere classified: Secondary | ICD-10-CM

## 2013-01-23 ENCOUNTER — Ambulatory Visit (INDEPENDENT_AMBULATORY_CARE_PROVIDER_SITE_OTHER): Payer: Medicare Other | Admitting: *Deleted

## 2013-01-23 DIAGNOSIS — I4891 Unspecified atrial fibrillation: Secondary | ICD-10-CM

## 2013-01-23 LAB — PACEMAKER DEVICE OBSERVATION
BATTERY VOLTAGE: 2.993 V
DEVICE MODEL PM: 2313421
RV LEAD AMPLITUDE: 12 mv
RV LEAD IMPEDENCE PM: 512.5 Ohm

## 2013-01-23 NOTE — Progress Notes (Signed)
PPM check in clinic 

## 2013-01-24 ENCOUNTER — Telehealth: Payer: Self-pay | Admitting: Cardiovascular Disease

## 2013-01-24 MED ORDER — SIMVASTATIN 20 MG PO TABS: 20.0000 mg | ORAL_TABLET | Freq: Every day | ORAL | Status: DC

## 2013-01-24 NOTE — Telephone Encounter (Signed)
Need refill on his Simvastatin 20mg #30

## 2013-01-24 NOTE — Addendum Note (Signed)
Addended byGaynelle Cage. on: 01/24/2013 12:48 PM   Modules accepted: Orders

## 2013-01-24 NOTE — Telephone Encounter (Signed)
Medication sent over via e-scribe.

## 2013-02-05 ENCOUNTER — Encounter: Payer: Self-pay | Admitting: Neurology

## 2013-02-05 ENCOUNTER — Ambulatory Visit (INDEPENDENT_AMBULATORY_CARE_PROVIDER_SITE_OTHER): Payer: Medicare Other | Admitting: Neurology

## 2013-02-05 VITALS — BP 146/86 | HR 73 | Ht 65.5 in | Wt 210.0 lb

## 2013-02-05 DIAGNOSIS — R269 Unspecified abnormalities of gait and mobility: Secondary | ICD-10-CM

## 2013-02-05 NOTE — Progress Notes (Signed)
History of present illness:   [name] is 79 years old right-handed male, referred by his primary care physician Dr. Sharlot Gowda for evaluation of mild gait difficulty  He had past medical history of obesity, hypertension, hyperlipidemia, atrial fibrillation, on chronic Coumadin treatment, he had 2 fall episodes since March 2014,  The first episode was in March 2014, he was going up steps, without warning signs, he fell to his left side, it seems to him that he did not pick up his left foot high enough, he denied chest pain, heart palpitations  Second episode was in in early May 2014, he got up from seated position, again without warning signs, he fell to his left side,  He complains of mild left hip pain, shooting pain to his left lateral thigh especially when he first got up taking the first few steps, getting better of he walks for a while, he denies low back pain, no bilateral lower extremity numbness, or weakness, he denies incontinence.  He denies language difficulty, bilateral arm motor or sensory deficit, no visual difficulty    CAT scan of the brain showed diffuse cortical atrophy. Chronic ischemic white matter disease. Old lacunar infarction in the anterior limb of left  internal capsule. No acute intracranial abnormality seen  He still works part-time job for [employer], sometimes walk 2 miles each night, but does not exercise regularly  Review of Systems  Out of a complete 14 system review, the patient complains of only the following symptoms, and all other reviewed systems are negative.   Constitutional:   N/A Cardiovascular:  N/A Ear/Nose/Throat:  N/A Skin:moles and Eyes: N/A Respiratory: N/A Gastroitestinal: N/A    Hematology/Lymphatic:  N/A Endocrine:  N/A Musculoskeletal:N/A Allergy/Immunology: N/A Neurological: N/A Psychiatric:    Decreased energy  PHYSICAL EXAMINATOINS:  Generalized: In no acute distress  Neck: Supple, no carotid bruits   Cardiac: mild  irregular rate rhythm  Pulmonary: Clear to auscultation bilaterally  Musculoskeletal: No deformity  Neurological examination  Mentation: Alert oriented to time, place, history taking, and causual conversation, tired looking, obese  Cranial nerve II-XII: Pupils were equal round reactive to light extraocular movements were full, visual field were full on confrontational test. facial sensation and strength were normal. hearing was intact to finger rubbing bilaterally. Uvula tongue midline.  head turning and shoulder shrug and were normal and symmetric.Tongue protrusion into cheek strength was normal.  Motor: mild decreased left arm strength, fixation on rapid rotating movmenet Sensory: Intact to fine touch, pinprick, preserved vibratory sensation, and proprioception at toes.  Coordination: Normal finger to nose, heel-to-shin bilaterally there was no truncal ataxia  Gait: Rising up from seated position without assistance, normal stance, without trunk ataxia, moderate stride, dragging left leg occasionally, good arm swing, smooth turning, able to perform tiptoe, and heel walking without difficulty.   Romberg signs: Negative  Deep tendon reflexes: Brachioradialis 2/2, biceps 2/2, triceps 2/2, patellar 2/2, Achilles 2/2, plantar responses were flexor bilaterally.  Assessment and plan: 79 years old right-handed male, with past medical history of hypertension, hyperlipidemia, atrial fibrillation, on chronic Coumadin treatment, presenting with 2 falling episodes since March 2014, on examination, he has atrial fibrillation, slight left arm weakness, dragging his left leg occasionally due to left hip pain while ambulating,CT head without contrast showed small vessel disease, atrophy, no acute lesions  1. differentiation diagnosis including age-related, deconditioning, obesity, left hip pathology,  2 continue current medications, I have suggested morrhuate exercise,  3, return to clinic  in 2 months,  if his symptoms getting worse, may consider further evaluation

## 2013-02-13 ENCOUNTER — Ambulatory Visit (INDEPENDENT_AMBULATORY_CARE_PROVIDER_SITE_OTHER): Payer: Medicare Other | Admitting: Pharmacist Clinician (PhC)/ Clinical Pharmacy Specialist

## 2013-02-13 DIAGNOSIS — Z7901 Long term (current) use of anticoagulants: Secondary | ICD-10-CM

## 2013-02-13 DIAGNOSIS — I4891 Unspecified atrial fibrillation: Secondary | ICD-10-CM

## 2013-02-26 ENCOUNTER — Telehealth: Payer: Self-pay

## 2013-02-26 ENCOUNTER — Ambulatory Visit
Admission: RE | Admit: 2013-02-26 | Discharge: 2013-02-26 | Disposition: A | Discharge status: Home / Self Care | Payer: Medicare Other | Source: Ambulatory Visit | Attending: Family Medicine | Admitting: Family Medicine

## 2013-02-26 ENCOUNTER — Encounter: Payer: Self-pay | Admitting: Internal Medicine

## 2013-02-26 ENCOUNTER — Other Ambulatory Visit: Payer: Self-pay

## 2013-02-26 ENCOUNTER — Ambulatory Visit (INDEPENDENT_AMBULATORY_CARE_PROVIDER_SITE_OTHER): Payer: Medicare Other | Admitting: Family Medicine

## 2013-02-26 ENCOUNTER — Encounter: Payer: Self-pay | Admitting: Family Medicine

## 2013-02-26 VITALS — BP 124/86 | HR 78 | Wt 206.0 lb

## 2013-02-26 DIAGNOSIS — I10 Essential (primary) hypertension: Secondary | ICD-10-CM

## 2013-02-26 DIAGNOSIS — M25561 Pain in right knee: Secondary | ICD-10-CM

## 2013-02-26 DIAGNOSIS — M25569 Pain in unspecified knee: Secondary | ICD-10-CM

## 2013-02-26 MED ORDER — BENAZEPRIL-HYDROCHLOROTHIAZIDE 20-12.5 MG PO TABS: 1.0000 | ORAL_TABLET | Freq: Every day | ORAL | Status: DC

## 2013-02-26 MED ORDER — POTASSIUM CHLORIDE CRYS ER 20 MEQ PO TBCR: 20.0000 meq | EXTENDED_RELEASE_TABLET | Freq: Every day | ORAL | Status: DC

## 2013-02-26 NOTE — Progress Notes (Signed)
  Subjective:    Patient ID: [name], male    DOB: 29-Nov-1930, 79 y.o.   MRN: [id number]  HPI He is here for recheck. He now is complaining of bilateral hip pain especially when he gets up. This pain does tend to go away but he also is had difficulty with right knee pain and points to the medial aspect of the knee. He will have pain and feels that his knee will give way which makes him fall. No popping, locking or grinding. In the last several weeks he's also noted medial knee pain with walking. He also needs several of his medications renewed.   Review of Systems     Objective:   Physical Exam Good motion of the hip without pain. Exam of the right knee shows no effusion. He does have tenderness over the medial joint line with McMurray's testing causing discomfort. Anterior drawer negative. Medial collateral ligament intact       Assessment & Plan:  Hypertension  Right knee pain - Plan: DG Knee 1-2 Views Right result came back showing seen degenerative changes. I will set him up for a CT arthrogram since he does have a pacemaker. I will need to get aggressive with this since the pain is causing him to fall and on trying to avoid hip fracture from falling.

## 2013-02-26 NOTE — Telephone Encounter (Signed)
error 

## 2013-03-01 ENCOUNTER — Telehealth: Payer: Self-pay | Admitting: Family Medicine

## 2013-03-01 NOTE — Telephone Encounter (Signed)
Called [facility] for CT Arthrogram.  She states can pt be off Coumadin for 4 days and he will need a PTT & PTINR morning of CT.  Please advise.

## 2013-03-02 NOTE — Telephone Encounter (Signed)
Go ahead and arrange this 

## 2013-03-04 ENCOUNTER — Telehealth: Payer: Self-pay

## 2013-03-04 NOTE — Telephone Encounter (Signed)
Waiting on dr. Ladona Ridgel to advise

## 2013-03-04 NOTE — Telephone Encounter (Signed)
I HAVE GOTTEN PRIOR AUTH FOR THE CT ARTHROGRAM [phone] I HAVE ENTERED IT IN TO THE WORKQUEUE CALLED AND TRIED TO GET AN APPOINTMENT BUT WAS TOLD I NEED TO GET CARDIOLOGY WHO WRITES PT RX FOR COUMADIN TO SEND A LETTER TO [facility] TO SAY IT IS OK FOR PATIENT TO STOP COUMADIN 4 DAYS PRIOR TO THE CT ARTHROGRAM ALSO TO HAVE PT/INR DRAWN THE MORNING OF  I FAXED A REQUEST TO DR.TAYLOR PER PATIENT REQUEST

## 2013-03-05 ENCOUNTER — Telehealth: Payer: Self-pay | Admitting: Internal Medicine

## 2013-03-07 ENCOUNTER — Telehealth: Payer: Self-pay

## 2013-03-07 ENCOUNTER — Other Ambulatory Visit: Payer: Self-pay

## 2013-03-07 DIAGNOSIS — Z79899 Other long term (current) drug therapy: Secondary | ICD-10-CM

## 2013-03-07 NOTE — Telephone Encounter (Signed)
PT INFORMED OF PTT PT/INR July 2 STOP COUM. 4 DAYS PRIOR June 28

## 2013-03-11 ENCOUNTER — Other Ambulatory Visit: Payer: Self-pay

## 2013-03-11 ENCOUNTER — Telehealth: Payer: Self-pay | Admitting: Internal Medicine

## 2013-03-11 DIAGNOSIS — R944 Abnormal results of kidney function studies: Secondary | ICD-10-CM

## 2013-03-11 NOTE — Telephone Encounter (Signed)
Pt was here asking a question? Pt states he is going for his cat scan on Wednesday for fluid in his knee and they are suppose to put dye in him. Pt states the last time he went for a cat scan to look at the nerve that runs from the brain down the body he was not able to get dye in him due to kidney problems and he wants to know if he will still be able to get the dye in him

## 2013-03-11 NOTE — Telephone Encounter (Signed)
Have him come back for a bmet

## 2013-03-12 ENCOUNTER — Other Ambulatory Visit: Payer: Medicare Other

## 2013-03-12 DIAGNOSIS — R944 Abnormal results of kidney function studies: Secondary | ICD-10-CM

## 2013-03-12 LAB — BASIC METABOLIC PANEL
BUN: 20 mg/dL (ref 6–23)
Calcium: 9.3 mg/dL (ref 8.4–10.5)
Chloride: 106 mEq/L (ref 96–112)
Creat: 1.53 mg/dL — ABNORMAL HIGH (ref 0.50–1.35)

## 2013-03-13 ENCOUNTER — Other Ambulatory Visit: Payer: Self-pay

## 2013-03-13 ENCOUNTER — Ambulatory Visit: Payer: Medicare Other | Admitting: Pharmacist Clinician (PhC)/ Clinical Pharmacy Specialist

## 2013-03-13 ENCOUNTER — Ambulatory Visit
Admission: RE | Admit: 2013-03-13 | Discharge: 2013-03-13 | Disposition: A | Discharge status: Home / Self Care | Payer: Medicare Other | Source: Ambulatory Visit | Attending: Family Medicine | Admitting: Family Medicine

## 2013-03-13 ENCOUNTER — Telehealth: Payer: Self-pay | Admitting: Family Medicine

## 2013-03-13 DIAGNOSIS — Z79899 Other long term (current) drug therapy: Secondary | ICD-10-CM

## 2013-03-13 LAB — APTT: aPTT: 36 seconds (ref 24–37)

## 2013-03-13 LAB — PROTIME-INR
INR: 1.5 — ABNORMAL HIGH (ref <1.50)
Prothrombin Time: 17.8 seconds — ABNORMAL HIGH (ref 11.6–15.2)

## 2013-03-13 MED ORDER — IOHEXOL 180 MG/ML  SOLN
40.0000 mL | Freq: Once | INTRAMUSCULAR | Status: AC | PRN
  Administered 2013-03-13: 40 mL via INTRA_ARTICULAR

## 2013-03-13 NOTE — Progress Notes (Signed)
Quick Note:  Sent referral to [facility] ______

## 2013-03-13 NOTE — Telephone Encounter (Signed)
HE WANTS YOU TO KNOW ABOUT THIS AND POSSIBLY REFER HIM TO HEARING SPECIALIST BUT AGAIN DOESN'T WANT DAD TO KNOW HE SAID SOMETHING

## 2013-03-13 NOTE — Progress Notes (Signed)
Quick Note:  I let him know about the results. He would like to see Dr.Olin ______

## 2013-03-18 ENCOUNTER — Telehealth: Payer: Self-pay | Admitting: Cardiovascular Disease

## 2013-03-18 NOTE — Telephone Encounter (Signed)
Pt restarted warfarin last [facility] July 2, needs to reschedule next INR.    Set for Friday July 11.

## 2013-03-18 NOTE — Telephone Encounter (Signed)
[name] is wanting to know when will he need to come in for his next coumadin visit

## 2013-03-22 ENCOUNTER — Ambulatory Visit (INDEPENDENT_AMBULATORY_CARE_PROVIDER_SITE_OTHER): Payer: Medicare Other | Admitting: Pharmacist Clinician (PhC)/ Clinical Pharmacy Specialist

## 2013-03-22 VITALS — BP 122/80 | HR 72

## 2013-03-22 DIAGNOSIS — I4891 Unspecified atrial fibrillation: Secondary | ICD-10-CM

## 2013-03-22 DIAGNOSIS — Z7901 Long term (current) use of anticoagulants: Secondary | ICD-10-CM

## 2013-03-22 LAB — POCT INR: INR: 1.9

## 2013-04-10 ENCOUNTER — Ambulatory Visit: Payer: Medicare Other | Admitting: Neurology

## 2013-04-18 ENCOUNTER — Ambulatory Visit: Payer: Medicare Other | Admitting: Pharmacist Clinician (PhC)/ Clinical Pharmacy Specialist

## 2013-04-29 ENCOUNTER — Ambulatory Visit: Payer: Medicare Other | Admitting: Pharmacist Clinician (PhC)/ Clinical Pharmacy Specialist

## 2013-04-29 ENCOUNTER — Ambulatory Visit (INDEPENDENT_AMBULATORY_CARE_PROVIDER_SITE_OTHER): Payer: Medicare Other | Admitting: Pharmacist Clinician (PhC)/ Clinical Pharmacy Specialist

## 2013-04-29 VITALS — BP 146/80 | HR 68

## 2013-04-29 DIAGNOSIS — I4891 Unspecified atrial fibrillation: Secondary | ICD-10-CM

## 2013-04-29 DIAGNOSIS — Z7901 Long term (current) use of anticoagulants: Secondary | ICD-10-CM

## 2013-05-20 ENCOUNTER — Telehealth: Payer: Self-pay | Admitting: Internal Medicine

## 2013-05-20 MED ORDER — SIMVASTATIN 20 MG PO TABS: 20.0000 mg | ORAL_TABLET | Freq: Every day | ORAL | Status: DC

## 2013-05-20 NOTE — Telephone Encounter (Signed)
SENT MED IN 

## 2013-05-20 NOTE — Telephone Encounter (Signed)
Pt is switching cardiologist and doesn't go to new cardiologist until mid November so he needs a refill from [place] for simvastatin 20mg  to [facility]

## 2013-05-27 ENCOUNTER — Ambulatory Visit (INDEPENDENT_AMBULATORY_CARE_PROVIDER_SITE_OTHER): Payer: Medicare Other | Admitting: Pharmacist Clinician (PhC)/ Clinical Pharmacy Specialist

## 2013-05-27 VITALS — BP 132/76 | HR 56

## 2013-05-27 DIAGNOSIS — I4891 Unspecified atrial fibrillation: Secondary | ICD-10-CM

## 2013-05-27 DIAGNOSIS — Z7901 Long term (current) use of anticoagulants: Secondary | ICD-10-CM

## 2013-06-10 ENCOUNTER — Ambulatory Visit (INDEPENDENT_AMBULATORY_CARE_PROVIDER_SITE_OTHER): Payer: Medicare Other | Admitting: Pharmacist Clinician (PhC)/ Clinical Pharmacy Specialist

## 2013-06-10 VITALS — BP 150/84 | HR 68

## 2013-06-10 DIAGNOSIS — Z7901 Long term (current) use of anticoagulants: Secondary | ICD-10-CM

## 2013-06-10 DIAGNOSIS — I4891 Unspecified atrial fibrillation: Secondary | ICD-10-CM

## 2013-06-18 ENCOUNTER — Encounter: Payer: Self-pay | Admitting: Family Medicine

## 2013-06-18 ENCOUNTER — Ambulatory Visit (INDEPENDENT_AMBULATORY_CARE_PROVIDER_SITE_OTHER): Payer: Medicare Other | Admitting: Family Medicine

## 2013-06-18 VITALS — BP 120/80 | HR 75 | Wt 205.0 lb

## 2013-06-18 DIAGNOSIS — L570 Actinic keratosis: Secondary | ICD-10-CM

## 2013-06-18 DIAGNOSIS — M79609 Pain in unspecified limb: Secondary | ICD-10-CM

## 2013-06-18 DIAGNOSIS — Z23 Encounter for immunization: Secondary | ICD-10-CM

## 2013-06-18 DIAGNOSIS — M79671 Pain in right foot: Secondary | ICD-10-CM

## 2013-06-18 NOTE — Progress Notes (Signed)
  Subjective:    Patient ID: [name], male    DOB: 03/28/1931, 79 y.o.   MRN: [id number]  HPI He is here for evaluation of his scalp. He occasionally has difficulty with lesions there and would like this evaluated. He also is had some difficulty with foot pain and swelling but not specifically in the joint. He has not had any redness tenderness or swelling in any particular joint. He is concerned about gout.   Review of Systems     Objective:   Physical Exam Alert and in no distress. Exam of the scalp does show pigmentary changes with several slightly scaly pinkish lesions. Exam of his right foot shows no swelling, tenderness to palpation or erythema.       Assessment & Plan:  Need for prophylactic vaccination and inoculation against influenza - Plan: Flu vaccine HIGH DOSE PF (Fluzone Tri High dose)  Actinic keratosis of scalp  Foot pain, right  discussed the treatment of actinic keratoses with him and since he is really having no difficulty, watchful waiting is appropriate recommend conservative care for foot pain. I indicated that it did not appear to be gout. Also discussed wearing proper fitting shoes. He was also given a flu shot with risks and benefits discussed

## 2013-07-08 ENCOUNTER — Ambulatory Visit (INDEPENDENT_AMBULATORY_CARE_PROVIDER_SITE_OTHER): Payer: Medicare Other | Admitting: Pharmacist Clinician (PhC)/ Clinical Pharmacy Specialist

## 2013-07-08 VITALS — BP 140/80 | HR 68

## 2013-07-08 DIAGNOSIS — I4891 Unspecified atrial fibrillation: Secondary | ICD-10-CM

## 2013-07-08 DIAGNOSIS — Z7901 Long term (current) use of anticoagulants: Secondary | ICD-10-CM

## 2013-07-16 ENCOUNTER — Encounter: Payer: Self-pay | Admitting: Internal Medicine

## 2013-07-16 ENCOUNTER — Ambulatory Visit (INDEPENDENT_AMBULATORY_CARE_PROVIDER_SITE_OTHER): Payer: Medicare Other | Admitting: Internal Medicine

## 2013-07-16 VITALS — BP 117/70 | HR 72 | Ht 65.0 in | Wt 205.4 lb

## 2013-07-16 DIAGNOSIS — E785 Hyperlipidemia, unspecified: Secondary | ICD-10-CM

## 2013-07-16 DIAGNOSIS — Z950 Presence of cardiac pacemaker: Secondary | ICD-10-CM

## 2013-07-16 DIAGNOSIS — I10 Essential (primary) hypertension: Secondary | ICD-10-CM

## 2013-07-16 DIAGNOSIS — I4891 Unspecified atrial fibrillation: Secondary | ICD-10-CM

## 2013-07-16 LAB — PACEMAKER DEVICE OBSERVATION
RV LEAD AMPLITUDE: 8.7 mv
RV LEAD IMPEDENCE PM: 440 Ohm
VENTRICULAR PACING PM: 21

## 2013-07-16 NOTE — Progress Notes (Signed)
HPI [name] returns today for followup. He is a 79 year old man with hypertension, atrial fibrillation, syncope, and bradycardia, status post permanent pacemaker insertion. In the interim, he has done well. He denies chest pain or shortness of breath. No peripheral edema. He has done well in the interim except for problems with arthritis. He denies syncope. He is interested in reducing some of his medications.  No Known Allergies   Current Outpatient Prescriptions  Medication Sig Dispense Refill  . benazepril-hydrochlorthiazide (LOTENSIN HCT) 20-12.5 MG per tablet Take 1 tablet by mouth daily.  30 tablet  5  . carvedilol (COREG) 3.125 MG tablet Take 1 tablet (3.125 mg total) by mouth 2 (two) times daily.  180 tablet  3  . doxazosin (CARDURA) 4 MG tablet Take 4 mg by mouth at bedtime.        Marland Kitchen latanoprost (XALATAN) 0.005 % ophthalmic solution Place 1 drop into both eyes at bedtime.        . Multiple Vitamin (MULTI VITAMIN MENS PO) Take by mouth.        . potassium chloride SA (K-DUR,KLOR-CON) 20 MEQ tablet Take 10 mEq by mouth daily.      . simvastatin (ZOCOR) 20 MG tablet Take 1 tablet (20 mg total) by mouth at bedtime.  30 tablet  3  . Vitamins A & D (VITAMIN A & D) 5000-400 UNITS CAPS Take 1 capsule by mouth daily.       Marland Kitchen warfarin (COUMADIN) 5 MG tablet Take 5 mg by mouth daily. As directed       No current facility-administered medications for this visit.   Facility-Administered Medications Ordered in Other Visits  Medication Dose Route Frequency Provider Last Rate Last Dose  . influenza  inactive virus vaccine (FLUZONE/FLUARIX) injection 0.5 mL  0.5 mL Intramuscular Once Joselyn Arrow, MD         Past Medical History  Diagnosis Date  . Hypertension   . GERD (gastroesophageal reflux disease)   . Diverticulosis   . Gout   . Obesity   . ED (erectile dysfunction)   . Chronic kidney disease     RENAL INSUFFICIENCY  . Glaucoma   . Heart disorder     ROS:   All systems reviewed  and negative except as noted in the HPI.   Past Surgical History  Procedure Laterality Date  . Pacemaker insertion  01/09/03     Family History  Problem Relation Age of Onset  . Heart disease Mother   . Hypertension Mother   . Kidney disease Mother   . Stroke Mother   . Heart disease Father      History   Social History  . Marital Status: Married    Spouse Name: N/A    Number of Children: 2  . Years of Education: S-College   Occupational History  .      Works part time   Social History Main Topics  . Smoking status: Former Smoker    Quit date: 09/13/1979  . Smokeless tobacco: Not on file  . Alcohol Use: Yes     Comment: Consumes 2-3 glasses of wine per week  . Drug Use: No  . Sexual Activity: Not on file   Other Topics Concern  . Not on file   Social History Narrative  . No narrative on file     BP 117/70  Pulse 72  Ht 5\' 5"  (1.651 m)  Wt 205 lb 6.4 oz (93.169 kg)  BMI 34.18 kg/m2  Physical  Exam:  Well appearing 79 year old man, NAD HEENT: Unremarkable Neck:  No JVD, no thyromegally Lungs:  Clear with no wheezes, rales, or rhonchi. HEART:  Regular rate rhythm, no murmurs, no rubs, no clicks Abd:  soft, positive bowel sounds, no organomegally, no rebound, no guarding Ext:  2 plus pulses, no edema, no cyanosis, no clubbing Skin:  No rashes no nodules Neuro:  CN II through XII intact, motor grossly intact  DEVICE  Normal device function.  See PaceArt for details.   Assess/Plan:

## 2013-07-16 NOTE — Assessment & Plan Note (Signed)
He is interested in stopping his simvastatin. We will have him stop it for a month, and recheck his fasting lipids. He is instructed to maintain a low-fat diet.

## 2013-07-16 NOTE — Assessment & Plan Note (Signed)
His blood pressure is well controlled. He'll continue his current antihypertensive medications and maintain a low-sodium diet.

## 2013-07-16 NOTE — Assessment & Plan Note (Signed)
His St. Jude dual-chamber pacemaker is stable. His pacing threshold is up a bit and his device is been reprogrammed today.

## 2013-07-16 NOTE — Patient Instructions (Addendum)
Remote monitoring is used to monitor your pacemaker from home. This monitoring reduces the number of office visits required to check your device to one time per year. It allows [place] to keep an eye on the functioning of your device to ensure it is working properly. You are scheduled for a device check from home on 10-17-2013. You may send your transmission at any time that day. If you have a wireless device, the transmission will be sent automatically. After your physician reviews your transmission, you will receive a postcard with your next transmission date.  Your physician recommends that you schedule a follow-up appointment in: 1 year   Your physician has recommended you make the following change in your medication:  1) Stop Simvastatin   Your physician recommends that you return for lab work in: 1 month

## 2013-07-22 ENCOUNTER — Encounter: Payer: Self-pay | Admitting: Internal Medicine

## 2013-07-23 ENCOUNTER — Other Ambulatory Visit: Payer: Self-pay | Admitting: *Deleted

## 2013-07-23 ENCOUNTER — Other Ambulatory Visit: Payer: Self-pay | Admitting: Internal Medicine

## 2013-07-23 ENCOUNTER — Ambulatory Visit (INDEPENDENT_AMBULATORY_CARE_PROVIDER_SITE_OTHER): Payer: Medicare Other | Admitting: Pharmacist Clinician (PhC)/ Clinical Pharmacy Specialist

## 2013-07-23 VITALS — BP 120/66 | HR 64

## 2013-07-23 DIAGNOSIS — Z7901 Long term (current) use of anticoagulants: Secondary | ICD-10-CM

## 2013-07-23 DIAGNOSIS — I4891 Unspecified atrial fibrillation: Secondary | ICD-10-CM

## 2013-07-23 LAB — POCT INR: INR: 2.5

## 2013-07-23 MED ORDER — DOXAZOSIN MESYLATE 4 MG PO TABS: 4.0000 mg | ORAL_TABLET | Freq: Every day | ORAL | Status: DC

## 2013-08-14 ENCOUNTER — Other Ambulatory Visit: Payer: Medicare Other

## 2013-08-14 ENCOUNTER — Other Ambulatory Visit (INDEPENDENT_AMBULATORY_CARE_PROVIDER_SITE_OTHER): Payer: Medicare Other

## 2013-08-14 DIAGNOSIS — I4891 Unspecified atrial fibrillation: Secondary | ICD-10-CM

## 2013-08-14 DIAGNOSIS — E785 Hyperlipidemia, unspecified: Secondary | ICD-10-CM

## 2013-08-14 LAB — LIPID PANEL
Cholesterol: 184 mg/dL (ref 0–200)
HDL: 30 mg/dL — ABNORMAL LOW (ref >39.00)
Total CHOL/HDL Ratio: 6
VLDL: 19.4 mg/dL (ref 0.0–40.0)

## 2013-08-15 ENCOUNTER — Other Ambulatory Visit: Payer: Medicare Other

## 2013-08-20 ENCOUNTER — Ambulatory Visit (INDEPENDENT_AMBULATORY_CARE_PROVIDER_SITE_OTHER): Payer: Medicare Other | Admitting: Pharmacist Clinician (PhC)/ Clinical Pharmacy Specialist

## 2013-08-20 VITALS — BP 130/72 | HR 80

## 2013-08-20 DIAGNOSIS — Z7901 Long term (current) use of anticoagulants: Secondary | ICD-10-CM

## 2013-08-20 DIAGNOSIS — I4891 Unspecified atrial fibrillation: Secondary | ICD-10-CM

## 2013-08-20 LAB — POCT INR: INR: 2.7

## 2013-08-26 ENCOUNTER — Other Ambulatory Visit: Payer: Self-pay | Admitting: *Deleted

## 2013-08-26 ENCOUNTER — Telehealth: Payer: Self-pay | Admitting: Pharmacist Clinician (PhC)/ Clinical Pharmacy Specialist

## 2013-08-26 ENCOUNTER — Other Ambulatory Visit: Payer: Self-pay

## 2013-08-26 MED ORDER — WARFARIN SODIUM 5 MG PO TABS: ORAL_TABLET | ORAL | Status: DC

## 2013-08-26 MED ORDER — BENAZEPRIL-HYDROCHLOROTHIAZIDE 20-12.5 MG PO TABS: 1.0000 | ORAL_TABLET | Freq: Every day | ORAL | Status: DC

## 2013-08-26 MED ORDER — POTASSIUM CHLORIDE CRYS ER 20 MEQ PO TBCR: 10.0000 meq | EXTENDED_RELEASE_TABLET | Freq: Every day | ORAL | Status: DC

## 2013-08-26 NOTE — Telephone Encounter (Signed)
Sent in klor-con and b/p med the simvastatin looks like it was discontinued

## 2013-08-26 NOTE — Telephone Encounter (Signed)
rx refill on warfarin

## 2013-08-27 ENCOUNTER — Other Ambulatory Visit: Payer: Self-pay | Admitting: *Deleted

## 2013-08-27 ENCOUNTER — Other Ambulatory Visit: Payer: Self-pay

## 2013-08-27 ENCOUNTER — Telehealth: Payer: Self-pay | Admitting: Internal Medicine

## 2013-08-27 NOTE — Telephone Encounter (Signed)
PT INFORMED HEART DR. DISCONTINUED THE MED AND HE SAID I WAS RIGHT SO NOT TO FILL IT

## 2013-08-27 NOTE — Telephone Encounter (Signed)
Refill request for simvastatin 20mg  #90 to [facility]

## 2013-08-28 ENCOUNTER — Other Ambulatory Visit: Payer: Self-pay | Admitting: *Deleted

## 2013-08-28 MED ORDER — DOXAZOSIN MESYLATE 4 MG PO TABS: 4.0000 mg | ORAL_TABLET | Freq: Every day | ORAL | Status: DC

## 2013-09-17 ENCOUNTER — Ambulatory Visit (INDEPENDENT_AMBULATORY_CARE_PROVIDER_SITE_OTHER): Payer: Medicare HMO | Admitting: Pharmacist Clinician (PhC)/ Clinical Pharmacy Specialist

## 2013-09-17 VITALS — BP 128/66 | HR 68

## 2013-09-17 DIAGNOSIS — I4891 Unspecified atrial fibrillation: Secondary | ICD-10-CM

## 2013-09-17 DIAGNOSIS — Z7901 Long term (current) use of anticoagulants: Secondary | ICD-10-CM

## 2013-09-17 LAB — POCT INR: INR: 2.3

## 2013-10-15 ENCOUNTER — Ambulatory Visit (INDEPENDENT_AMBULATORY_CARE_PROVIDER_SITE_OTHER): Payer: Medicare HMO | Admitting: Pharmacist Clinician (PhC)/ Clinical Pharmacy Specialist

## 2013-10-15 VITALS — BP 148/94 | HR 84

## 2013-10-15 DIAGNOSIS — I4891 Unspecified atrial fibrillation: Secondary | ICD-10-CM

## 2013-10-15 DIAGNOSIS — Z7901 Long term (current) use of anticoagulants: Secondary | ICD-10-CM

## 2013-10-15 LAB — POCT INR: INR: 2.1

## 2013-10-17 ENCOUNTER — Ambulatory Visit (INDEPENDENT_AMBULATORY_CARE_PROVIDER_SITE_OTHER): Payer: Medicare HMO | Admitting: *Deleted

## 2013-10-17 DIAGNOSIS — I4891 Unspecified atrial fibrillation: Secondary | ICD-10-CM

## 2013-10-22 LAB — MDC_IDC_ENUM_SESS_TYPE_REMOTE
Implantable Pulse Generator Model: 1110
Implantable Pulse Generator Serial Number: 2313421
MDC IDC MSMT LEADCHNL RV IMPEDANCE VALUE: 430 Ohm
MDC IDC MSMT LEADCHNL RV SENSING INTR AMPL: 4.1 mV
MDC IDC STAT BRADY RV PERCENT PACED: 17 %

## 2013-11-05 ENCOUNTER — Encounter: Payer: Self-pay | Admitting: *Deleted

## 2013-11-13 ENCOUNTER — Encounter: Payer: Self-pay | Admitting: Internal Medicine

## 2013-11-18 ENCOUNTER — Other Ambulatory Visit: Payer: Self-pay | Admitting: Cardiovascular Disease

## 2013-12-06 ENCOUNTER — Ambulatory Visit (INDEPENDENT_AMBULATORY_CARE_PROVIDER_SITE_OTHER): Payer: Medicare HMO | Admitting: Pharmacist Clinician (PhC)/ Clinical Pharmacy Specialist

## 2013-12-06 VITALS — BP 126/76 | HR 64

## 2013-12-06 DIAGNOSIS — Z7901 Long term (current) use of anticoagulants: Secondary | ICD-10-CM

## 2013-12-06 DIAGNOSIS — I4891 Unspecified atrial fibrillation: Secondary | ICD-10-CM

## 2013-12-06 LAB — POCT INR: INR: 2.9

## 2014-01-02 NOTE — Telephone Encounter (Signed)
Closed encounter °

## 2014-01-16 ENCOUNTER — Ambulatory Visit (INDEPENDENT_AMBULATORY_CARE_PROVIDER_SITE_OTHER): Payer: Medicare HMO | Admitting: *Deleted

## 2014-01-16 ENCOUNTER — Telehealth: Payer: Self-pay | Admitting: Cardiology

## 2014-01-16 DIAGNOSIS — I4891 Unspecified atrial fibrillation: Secondary | ICD-10-CM

## 2014-01-16 DIAGNOSIS — R55 Syncope and collapse: Secondary | ICD-10-CM

## 2014-01-16 NOTE — Telephone Encounter (Signed)
I called pt to remind pt to complete home monitoring check of his device. Pt states that he had already sent it three times but he believes that something is wrong with his monitor. I informed pt that someone would call him back to help him with this issue. Pt verbalized that he would be gone for the rest of the evening that he had to work in the morning to call him back tomorrow afternoon.

## 2014-01-17 ENCOUNTER — Encounter: Payer: Self-pay | Admitting: Internal Medicine

## 2014-01-17 ENCOUNTER — Ambulatory Visit: Payer: Medicare HMO | Admitting: Pharmacist Clinician (PhC)/ Clinical Pharmacy Specialist

## 2014-01-17 ENCOUNTER — Telehealth: Payer: Self-pay | Admitting: Internal Medicine

## 2014-01-17 NOTE — Telephone Encounter (Signed)
I attempted to troubleshoot w/ pt while communicating on his cell phone, transmission unsuccessful. Gave pt tech svcs #.

## 2014-01-17 NOTE — Telephone Encounter (Signed)
Notes on other 01/16/14 entry

## 2014-01-17 NOTE — Telephone Encounter (Signed)
New message ° ° ° ° ° °Returning a nurses call °

## 2014-01-20 ENCOUNTER — Ambulatory Visit (INDEPENDENT_AMBULATORY_CARE_PROVIDER_SITE_OTHER): Payer: Medicare HMO | Admitting: Pharmacist Clinician (PhC)/ Clinical Pharmacy Specialist

## 2014-01-20 VITALS — BP 148/90 | HR 64

## 2014-01-20 DIAGNOSIS — Z7901 Long term (current) use of anticoagulants: Secondary | ICD-10-CM

## 2014-01-20 DIAGNOSIS — I4891 Unspecified atrial fibrillation: Secondary | ICD-10-CM

## 2014-01-20 LAB — POCT INR: INR: 2.6

## 2014-01-30 LAB — MDC_IDC_ENUM_SESS_TYPE_REMOTE
Battery Remaining Longevity: 76 mo
Date Time Interrogation Session: 20150508193858
Implantable Pulse Generator Model: 1110
Lead Channel Impedance Value: 440 Ohm
Lead Channel Pacing Threshold Amplitude: 2.25 V
Lead Channel Pacing Threshold Pulse Width: 0.8 ms
Lead Channel Sensing Intrinsic Amplitude: 4.6 mV
MDC IDC MSMT BATTERY VOLTAGE: 2.96 V
MDC IDC PG SERIAL: 2313421
MDC IDC SET LEADCHNL RV PACING AMPLITUDE: 4 V
MDC IDC SET LEADCHNL RV PACING PULSEWIDTH: 0.8 ms
MDC IDC SET LEADCHNL RV SENSING SENSITIVITY: 2 mV
MDC IDC STAT BRADY RV PERCENT PACED: 18 %

## 2014-01-31 NOTE — Progress Notes (Signed)
Remote pacemaker transmission.   

## 2014-02-07 ENCOUNTER — Encounter: Payer: Self-pay | Admitting: Cardiology

## 2014-02-23 ENCOUNTER — Other Ambulatory Visit: Payer: Self-pay | Admitting: Family Medicine

## 2014-02-24 NOTE — Telephone Encounter (Signed)
Is this ok to refill? Last of 03/13/13

## 2014-03-02 ENCOUNTER — Other Ambulatory Visit: Payer: Self-pay | Admitting: Family Medicine

## 2014-03-03 ENCOUNTER — Ambulatory Visit (INDEPENDENT_AMBULATORY_CARE_PROVIDER_SITE_OTHER): Payer: Medicare HMO | Admitting: Pharmacist Clinician (PhC)/ Clinical Pharmacy Specialist

## 2014-03-03 VITALS — BP 152/90 | HR 68

## 2014-03-03 DIAGNOSIS — I4891 Unspecified atrial fibrillation: Secondary | ICD-10-CM

## 2014-03-03 DIAGNOSIS — Z7901 Long term (current) use of anticoagulants: Secondary | ICD-10-CM

## 2014-03-03 LAB — POCT INR: INR: 2.2

## 2014-04-14 ENCOUNTER — Ambulatory Visit (INDEPENDENT_AMBULATORY_CARE_PROVIDER_SITE_OTHER): Payer: Medicare HMO | Admitting: Pharmacist Clinician (PhC)/ Clinical Pharmacy Specialist

## 2014-04-14 ENCOUNTER — Other Ambulatory Visit: Payer: Self-pay | Admitting: Internal Medicine

## 2014-04-14 VITALS — BP 150/82 | HR 60

## 2014-04-14 DIAGNOSIS — Z7901 Long term (current) use of anticoagulants: Secondary | ICD-10-CM

## 2014-04-14 DIAGNOSIS — I4891 Unspecified atrial fibrillation: Secondary | ICD-10-CM

## 2014-04-14 LAB — POCT INR: INR: 2

## 2014-04-21 ENCOUNTER — Encounter: Payer: Self-pay | Admitting: Family Medicine

## 2014-04-21 ENCOUNTER — Ambulatory Visit
Admission: RE | Admit: 2014-04-21 | Discharge: 2014-04-21 | Disposition: A | Discharge status: Home / Self Care | Payer: Medicare HMO | Source: Ambulatory Visit | Attending: Family Medicine | Admitting: Family Medicine

## 2014-04-21 ENCOUNTER — Ambulatory Visit (INDEPENDENT_AMBULATORY_CARE_PROVIDER_SITE_OTHER): Payer: Medicare HMO | Admitting: *Deleted

## 2014-04-21 ENCOUNTER — Telehealth: Payer: Self-pay | Admitting: Cardiology

## 2014-04-21 ENCOUNTER — Ambulatory Visit (INDEPENDENT_AMBULATORY_CARE_PROVIDER_SITE_OTHER): Payer: Medicare HMO | Admitting: Family Medicine

## 2014-04-21 VITALS — BP 138/80 | HR 60 | Wt 205.0 lb

## 2014-04-21 DIAGNOSIS — I4891 Unspecified atrial fibrillation: Secondary | ICD-10-CM

## 2014-04-21 DIAGNOSIS — M25579 Pain in unspecified ankle and joints of unspecified foot: Secondary | ICD-10-CM

## 2014-04-21 DIAGNOSIS — I10 Essential (primary) hypertension: Secondary | ICD-10-CM

## 2014-04-21 DIAGNOSIS — Z7901 Long term (current) use of anticoagulants: Secondary | ICD-10-CM

## 2014-04-21 DIAGNOSIS — M25572 Pain in left ankle and joints of left foot: Secondary | ICD-10-CM

## 2014-04-21 DIAGNOSIS — R55 Syncope and collapse: Secondary | ICD-10-CM

## 2014-04-21 DIAGNOSIS — E785 Hyperlipidemia, unspecified: Secondary | ICD-10-CM

## 2014-04-21 LAB — CBC WITH DIFFERENTIAL/PLATELET
Basophils Absolute: 0.1 10*3/uL (ref 0.0–0.1)
Basophils Relative: 1 % (ref 0–1)
EOS PCT: 6 % — AB (ref 0–5)
Eosinophils Absolute: 0.4 10*3/uL (ref 0.0–0.7)
HEMATOCRIT: 41.7 % (ref 39.0–52.0)
HEMOGLOBIN: 13.8 g/dL (ref 13.0–17.0)
LYMPHS ABS: 1 10*3/uL (ref 0.7–4.0)
Lymphocytes Relative: 16 % (ref 12–46)
MCH: 30.6 pg (ref 26.0–34.0)
MCHC: 33.1 g/dL (ref 30.0–36.0)
MCV: 92.5 fL (ref 78.0–100.0)
MONO ABS: 0.9 10*3/uL (ref 0.1–1.0)
MONOS PCT: 14 % — AB (ref 3–12)
Neutro Abs: 3.9 10*3/uL (ref 1.7–7.7)
Neutrophils Relative %: 63 % (ref 43–77)
Platelets: 162 10*3/uL (ref 150–400)
RBC: 4.51 MIL/uL (ref 4.22–5.81)
RDW: 14.4 % (ref 11.5–15.5)
WBC: 6.2 10*3/uL (ref 4.0–10.5)

## 2014-04-21 MED ORDER — SIMVASTATIN 20 MG PO TABS: 20.0000 mg | ORAL_TABLET | Freq: Every day | ORAL | Status: DC

## 2014-04-21 NOTE — Patient Instructions (Signed)
Take 2 Aleve twice a day for the next several week. After several days if you really don't see any improvement then switch to 4 Advil 3 times per day Start back on the simvastatin

## 2014-04-21 NOTE — Progress Notes (Signed)
Remote pacemaker transmission.   

## 2014-04-21 NOTE — Telephone Encounter (Signed)
Spoke with pt and reminded pt of remote transmission that is due today. Pt verbalized understanding.   

## 2014-04-21 NOTE — Progress Notes (Signed)
   Subjective:    Patient ID: [name], male    DOB: March 05, 1931, 80 y.o.   MRN: [id number]  HPI He is here for evaluation of left ankle pain and swelling. The symptoms started approximately one week ago and he noted redness, warmth and tenderness to palpation. He did try 2 Aleve with minimal relief. He states that he has had a previous history of gout in the left great toe. He also is had difficulty recently with mid epigastric distress especially when he drinks coffee. He states that he had stopped his simvastatin and did have blood work done but did not hear back from his cardiologist's concerning this. He continues on Coumadin for treatment of his A. fib. He is doing quite well on this and is comfortable staying on that particular regimen.  Review of Systems     Objective:   Physical Exam Alert and in no distress. Left ankle does show swelling however it is not tender, red or warm. Good motion of the ankle. No swelling or pain on palpation of the first MTP. X-ray shows no major changes.       Assessment & Plan:  Hyperlipidemia LDL goal <70 - Plan: Lipid panel, simvastatin (ZOCOR) 20 MG tablet  Left ankle pain - Plan: Uric Acid, DG Ankle 2 Views Left  Long term (current) use of anticoagulants  Essential hypertension - Plan: CBC with Differential, Comprehensive metabolic panel  routine blood screening ordered. Will also start him back on his simvastatin. recommend 2 Aleve twice per day and if no benefit then switch to ibuprofen. If he has further difficulty, further intervention might be needed.

## 2014-04-22 LAB — LIPID PANEL
Cholesterol: 143 mg/dL (ref 0–200)
HDL: 33 mg/dL — AB (ref >39)
LDL Cholesterol: 95 mg/dL (ref 0–99)
Total CHOL/HDL Ratio: 4.3 Ratio
Triglycerides: 75 mg/dL (ref <150)
VLDL: 15 mg/dL (ref 0–40)

## 2014-04-22 LAB — COMPREHENSIVE METABOLIC PANEL
ALBUMIN: 3.8 g/dL (ref 3.5–5.2)
ALT: 12 U/L (ref 0–53)
AST: 17 U/L (ref 0–37)
Alkaline Phosphatase: 62 U/L (ref 39–117)
BUN: 19 mg/dL (ref 6–23)
CALCIUM: 8.8 mg/dL (ref 8.4–10.5)
CHLORIDE: 104 meq/L (ref 96–112)
CO2: 28 meq/L (ref 19–32)
CREATININE: 1.32 mg/dL (ref 0.50–1.35)
Glucose, Bld: 89 mg/dL (ref 70–99)
Potassium: 4.4 mEq/L (ref 3.5–5.3)
Sodium: 140 mEq/L (ref 135–145)
Total Bilirubin: 0.8 mg/dL (ref 0.2–1.2)
Total Protein: 6.2 g/dL (ref 6.0–8.3)

## 2014-04-22 LAB — URIC ACID: Uric Acid, Serum: 7.5 mg/dL (ref 4.0–7.8)

## 2014-05-02 LAB — MDC_IDC_ENUM_SESS_TYPE_REMOTE
Battery Remaining Longevity: 92 mo
Battery Remaining Percentage: 95.5 %
Battery Voltage: 2.99 V
Date Time Interrogation Session: 20150810181622
Implantable Pulse Generator Serial Number: 2313421
Lead Channel Pacing Threshold Amplitude: 2.25 V
Lead Channel Setting Pacing Amplitude: 4 V
Lead Channel Setting Pacing Pulse Width: 0.8 ms
Lead Channel Setting Sensing Sensitivity: 2 mV
MDC IDC MSMT LEADCHNL RV IMPEDANCE VALUE: 440 Ohm
MDC IDC MSMT LEADCHNL RV PACING THRESHOLD PULSEWIDTH: 0.8 ms
MDC IDC MSMT LEADCHNL RV SENSING INTR AMPL: 3.7 mV
MDC IDC STAT BRADY RV PERCENT PACED: 19 %

## 2014-05-13 ENCOUNTER — Encounter: Payer: Self-pay | Admitting: Cardiology

## 2014-05-19 ENCOUNTER — Encounter: Payer: Self-pay | Admitting: Internal Medicine

## 2014-05-26 ENCOUNTER — Ambulatory Visit (INDEPENDENT_AMBULATORY_CARE_PROVIDER_SITE_OTHER): Payer: Medicare HMO | Admitting: Pharmacist Clinician (PhC)/ Clinical Pharmacy Specialist

## 2014-05-26 VITALS — BP 144/84 | HR 60

## 2014-05-26 DIAGNOSIS — I4891 Unspecified atrial fibrillation: Secondary | ICD-10-CM

## 2014-05-26 DIAGNOSIS — Z7901 Long term (current) use of anticoagulants: Secondary | ICD-10-CM

## 2014-05-26 LAB — POCT INR: INR: 3.4

## 2014-06-01 ENCOUNTER — Other Ambulatory Visit: Payer: Self-pay | Admitting: Family Medicine

## 2014-06-06 ENCOUNTER — Other Ambulatory Visit (INDEPENDENT_AMBULATORY_CARE_PROVIDER_SITE_OTHER): Payer: Medicare HMO

## 2014-06-06 DIAGNOSIS — Z23 Encounter for immunization: Secondary | ICD-10-CM

## 2014-06-11 ENCOUNTER — Telehealth: Payer: Self-pay | Admitting: Internal Medicine

## 2014-06-11 ENCOUNTER — Other Ambulatory Visit: Payer: Self-pay

## 2014-06-11 MED ORDER — POTASSIUM CHLORIDE CRYS ER 20 MEQ PO TBCR: EXTENDED_RELEASE_TABLET | ORAL | Status: DC

## 2014-06-11 NOTE — Telephone Encounter (Signed)
Refill request for Klor-Con M20 #90 to [facility]

## 2014-06-11 NOTE — Telephone Encounter (Signed)
DONE

## 2014-06-16 ENCOUNTER — Other Ambulatory Visit: Payer: Self-pay | Admitting: Pharmacist Clinician (PhC)/ Clinical Pharmacy Specialist

## 2014-06-16 MED ORDER — WARFARIN SODIUM 5 MG PO TABS: ORAL_TABLET | ORAL | Status: DC

## 2014-06-23 ENCOUNTER — Ambulatory Visit (INDEPENDENT_AMBULATORY_CARE_PROVIDER_SITE_OTHER): Payer: Medicare HMO | Admitting: Pharmacist Clinician (PhC)/ Clinical Pharmacy Specialist

## 2014-06-23 DIAGNOSIS — Z7901 Long term (current) use of anticoagulants: Secondary | ICD-10-CM

## 2014-06-23 DIAGNOSIS — I4891 Unspecified atrial fibrillation: Secondary | ICD-10-CM

## 2014-06-23 LAB — POCT INR: INR: 2.1

## 2014-07-16 ENCOUNTER — Other Ambulatory Visit: Payer: Self-pay | Admitting: Internal Medicine

## 2014-07-29 ENCOUNTER — Encounter: Payer: Self-pay | Admitting: Internal Medicine

## 2014-07-29 ENCOUNTER — Ambulatory Visit (INDEPENDENT_AMBULATORY_CARE_PROVIDER_SITE_OTHER): Payer: Medicare HMO | Admitting: Internal Medicine

## 2014-07-29 VITALS — BP 146/80 | HR 66 | Ht 65.0 in | Wt 207.2 lb

## 2014-07-29 DIAGNOSIS — I10 Essential (primary) hypertension: Secondary | ICD-10-CM

## 2014-07-29 DIAGNOSIS — Z950 Presence of cardiac pacemaker: Secondary | ICD-10-CM

## 2014-07-29 DIAGNOSIS — I4891 Unspecified atrial fibrillation: Secondary | ICD-10-CM

## 2014-07-29 DIAGNOSIS — R55 Syncope and collapse: Secondary | ICD-10-CM

## 2014-07-29 LAB — MDC_IDC_ENUM_SESS_TYPE_INCLINIC
Battery Remaining Longevity: 123.6 mo
Battery Voltage: 2.98 V
Date Time Interrogation Session: 20151117132330
Lead Channel Pacing Threshold Amplitude: 2.25 V
Lead Channel Pacing Threshold Pulse Width: 0.8 ms
Lead Channel Sensing Intrinsic Amplitude: 5.2 mV
Lead Channel Setting Pacing Pulse Width: 0.8 ms
MDC IDC MSMT LEADCHNL RV IMPEDANCE VALUE: 462.5 Ohm
MDC IDC PG SERIAL: 2313421
MDC IDC SET LEADCHNL RV PACING AMPLITUDE: 4 V
MDC IDC SET LEADCHNL RV SENSING SENSITIVITY: 2 mV
MDC IDC STAT BRADY RV PERCENT PACED: 19 %

## 2014-07-29 MED ORDER — DOXAZOSIN MESYLATE 4 MG PO TABS: 4.0000 mg | ORAL_TABLET | Freq: Every day | ORAL | Status: DC

## 2014-07-29 NOTE — Progress Notes (Signed)
HPI Mr. Slatten returns today for followup. He is a 80 year old man with hypertension, atrial fibrillation, syncope, and bradycardia, status post permanent pacemaker insertion. In the interim, he has done well except for some arthritis. He denies chest pain or shortness of breath. No peripheral edema.  He denies syncope.   No Known Allergies   Current Outpatient Prescriptions  Medication Sig Dispense Refill  . benazepril-hydrochlorthiazide (LOTENSIN HCT) 20-12.5 MG per tablet TAKE 1 TABLET BY MOUTH DAILY. 90 tablet 0  . carvedilol (COREG) 3.125 MG tablet TAKE 1 TABLET (3.125 MG TOTAL) BY MOUTH 2 (TWO) TIMES DAILY. 180 tablet 0  . doxazosin (CARDURA) 4 MG tablet Take 1 tablet (4 mg total) by mouth at bedtime. 30 tablet 5  . latanoprost (XALATAN) 0.005 % ophthalmic solution Place 1 drop into both eyes at bedtime.      . Multiple Vitamin (MULTI VITAMIN MENS PO) Take by mouth.      . Multiple Vitamins-Minerals (PRESERVISION AREDS 2 PO) Take 2 capsules by mouth daily. I-CAPS    . potassium chloride SA (K-DUR,KLOR-CON) 20 MEQ tablet Take 0.5 tablets (10 mEq total) by mouth daily. 90 tablet 1  . potassium chloride SA (KLOR-CON M20) 20 MEQ tablet TAKE 1 TABLET EVERY DAY 30 tablet 2  . simvastatin (ZOCOR) 20 MG tablet Take 1 tablet (20 mg total) by mouth at bedtime. 90 tablet 3  . Vitamins A & D (VITAMIN A & D) 5000-400 UNITS CAPS Take 1 capsule by mouth daily.     Marland Kitchen warfarin (COUMADIN) 5 MG tablet TAKE 1 TABLET EVERY DAY OR USE AS DIRECTED 90 tablet 1   No current facility-administered medications for this visit.   Facility-Administered Medications Ordered in Other Visits  Medication Dose Route Frequency Provider Last Rate Last Dose  . influenza  inactive virus vaccine (FLUZONE/FLUARIX) injection 0.5 mL  0.5 mL Intramuscular Once Rita Ohara, MD         Past Medical History  Diagnosis Date  . Hypertension   . GERD (gastroesophageal reflux disease)   . Diverticulosis   . Gout   . Obesity   . ED  (erectile dysfunction)   . Chronic kidney disease     RENAL INSUFFICIENCY  . Glaucoma   . Heart disorder     ROS:   All systems reviewed and negative except as noted in the HPI.   Past Surgical History  Procedure Laterality Date  . Pacemaker insertion  01/09/03     Family History  Problem Relation Age of Onset  . Heart disease Mother   . Hypertension Mother   . Kidney disease Mother   . Stroke Mother   . Heart disease Father      History   Social History  . Marital Status: Married    Spouse Name: N/A    Number of Children: 2  . Years of Education: S-College   Occupational History  .      Works part time   Social History Main Topics  . Smoking status: Former Smoker    Quit date: 09/13/1979  . Smokeless tobacco: Not on file  . Alcohol Use: Yes     Comment: Consumes 2-3 glasses of wine per week  . Drug Use: No  . Sexual Activity: Not on file   Other Topics Concern  . Not on file   Social History Narrative     BP 146/80 mmHg  Pulse 66  Ht 5\' 5"  (1.651 m)  Wt 207 lb 3.2 oz (93.985 kg)  BMI 34.48 kg/m2  SpO2 98%  Physical Exam:  Well appearing 80 year old man, NAD HEENT: Unremarkable Neck:  No JVD, no thyromegally Lungs:  Clear with no wheezes, rales, or rhonchi. HEART:  IRegular rate rhythm, no murmurs, no rubs, no clicks Abd:  soft, positive bowel sounds, no organomegally, no rebound, no guarding Ext:  2 plus pulses, no edema, no cyanosis, no clubbing Skin:  No rashes no nodules Neuro:  CN II through XII intact, motor grossly intact  DEVICE  Normal device function.  See PaceArt for details.   Assess/Plan:

## 2014-07-29 NOTE — Assessment & Plan Note (Signed)
His blood pressure today is slightly elevated. He is on multiple medications. If his pressure remains high, we would consider up titration of his beta blocker.

## 2014-07-29 NOTE — Patient Instructions (Signed)
Your physician wants you to follow-up in: 12 months with Dr. Knox Saliva will receive a reminder letter in the mail two months in advance. If you don't receive a letter, please call our office to schedule the follow-up appointment.  Remote monitoring is used to monitor your Pacemaker or ICD from home. This monitoring reduces the number of office visits required to check your device to one time per year. It allows [place] to keep an eye on the functioning of your device to ensure it is working properly. You are scheduled for a device check from home on 10/29/14. You may send your transmission at any time that day. If you have a wireless device, the transmission will be sent automatically. After your physician reviews your transmission, you will receive a postcard with your next transmission date.

## 2014-07-29 NOTE — Assessment & Plan Note (Signed)
His St. Jude single chamber pacemaker is working normally. We'll plan to recheck in several months.

## 2014-07-29 NOTE — Assessment & Plan Note (Signed)
His ventricular rate is well controlled. He will continue his current medical therapy.

## 2014-08-04 ENCOUNTER — Ambulatory Visit (INDEPENDENT_AMBULATORY_CARE_PROVIDER_SITE_OTHER): Payer: Medicare HMO | Admitting: Pharmacist Clinician (PhC)/ Clinical Pharmacy Specialist

## 2014-08-04 DIAGNOSIS — Z7901 Long term (current) use of anticoagulants: Secondary | ICD-10-CM

## 2014-08-04 DIAGNOSIS — I4891 Unspecified atrial fibrillation: Secondary | ICD-10-CM

## 2014-08-04 LAB — POCT INR: INR: 2.2

## 2014-09-04 ENCOUNTER — Other Ambulatory Visit: Payer: Self-pay | Admitting: Family Medicine

## 2014-09-15 ENCOUNTER — Ambulatory Visit (INDEPENDENT_AMBULATORY_CARE_PROVIDER_SITE_OTHER): Payer: Medicare HMO | Admitting: Pharmacist Clinician (PhC)/ Clinical Pharmacy Specialist

## 2014-09-15 DIAGNOSIS — Z7901 Long term (current) use of anticoagulants: Secondary | ICD-10-CM

## 2014-09-15 DIAGNOSIS — I4891 Unspecified atrial fibrillation: Secondary | ICD-10-CM

## 2014-09-15 LAB — POCT INR: INR: 2.9

## 2014-09-16 ENCOUNTER — Encounter: Payer: Self-pay | Admitting: Family Medicine

## 2014-09-16 ENCOUNTER — Ambulatory Visit (INDEPENDENT_AMBULATORY_CARE_PROVIDER_SITE_OTHER): Payer: Medicare Other | Admitting: Family Medicine

## 2014-09-16 VITALS — BP 116/74 | HR 70 | Temp 98.4°F | Wt 201.0 lb

## 2014-09-16 DIAGNOSIS — J040 Acute laryngitis: Secondary | ICD-10-CM

## 2014-09-16 DIAGNOSIS — J209 Acute bronchitis, unspecified: Secondary | ICD-10-CM

## 2014-09-16 DIAGNOSIS — I4891 Unspecified atrial fibrillation: Secondary | ICD-10-CM

## 2014-09-16 MED ORDER — AMOXICILLIN 875 MG PO TABS: 875.0000 mg | ORAL_TABLET | Freq: Two times a day (BID) | ORAL | Status: DC

## 2014-09-16 NOTE — Progress Notes (Signed)
   Subjective:    Patient ID: [name], male    DOB: [birth year], 81 y.o.   MRN: [id number]  HPI He complains of a ten-day history this started with PND followed by cough that has become productive, hoarse voice, malaise and fatigue. No fever, chills, earache or sore throat.   Review of Systems     Objective:   Physical Exam alert and in no distress. Tympanic membranes and canals are normal. Throat is clear. Tonsils are normal. Neck is supple without adenopathy or thyromegaly. Cardiac exam shows an irregular  rhythm without murmurs or gallops. Lungs are clear to auscultation.        Assessment & Plan:  Acute laryngitis - Plan: amoxicillin (AMOXIL) 875 MG tablet  Acute bronchitis, unspecified organism - Plan: amoxicillin (AMOXIL) 875 MG tablet  Atrial fibrillation, unspecified

## 2014-09-22 ENCOUNTER — Other Ambulatory Visit: Payer: Self-pay

## 2014-09-22 MED ORDER — DOXAZOSIN MESYLATE 4 MG PO TABS: 4.0000 mg | ORAL_TABLET | Freq: Every day | ORAL | Status: DC

## 2014-10-08 ENCOUNTER — Other Ambulatory Visit: Payer: Self-pay | Admitting: *Deleted

## 2014-10-08 ENCOUNTER — Other Ambulatory Visit: Payer: Self-pay

## 2014-10-08 ENCOUNTER — Telehealth: Payer: Self-pay | Admitting: Family Medicine

## 2014-10-08 DIAGNOSIS — E785 Hyperlipidemia, unspecified: Secondary | ICD-10-CM

## 2014-10-08 MED ORDER — POTASSIUM CHLORIDE CRYS ER 20 MEQ PO TBCR: 10.0000 meq | EXTENDED_RELEASE_TABLET | Freq: Every day | ORAL | Status: DC

## 2014-10-08 MED ORDER — SIMVASTATIN 20 MG PO TABS: 20.0000 mg | ORAL_TABLET | Freq: Every day | ORAL | Status: DC

## 2014-10-08 MED ORDER — CARVEDILOL 3.125 MG PO TABS: ORAL_TABLET | ORAL | Status: DC

## 2014-10-08 NOTE — Telephone Encounter (Signed)
Pt called and stated he has a NEW mail order pharmacy. He needs mediations refilled for 90 days. NEW PHARMACY IS [facility]. Please send 90 days of Simvastatin and Klor-com. Pt can be reached at [phone].

## 2014-10-08 NOTE — Telephone Encounter (Signed)
done

## 2014-10-27 ENCOUNTER — Ambulatory Visit: Payer: PRIVATE HEALTH INSURANCE | Admitting: Pharmacist Clinician (PhC)/ Clinical Pharmacy Specialist

## 2014-10-28 ENCOUNTER — Telehealth: Payer: Self-pay | Admitting: Family Medicine

## 2014-10-28 NOTE — Telephone Encounter (Signed)
Rosalee called and stated that [name] is still having issues. She is requesting a recommendation for something OTC for allergies. She also stated that he can't take anything with a decongestant in it. Please call Rosalee at [phone].

## 2014-10-28 NOTE — Telephone Encounter (Signed)
Have him try the same as her. Claritin, Allegra, Zyrtec

## 2014-10-29 ENCOUNTER — Ambulatory Visit (INDEPENDENT_AMBULATORY_CARE_PROVIDER_SITE_OTHER): Payer: Medicare Other | Admitting: *Deleted

## 2014-10-29 DIAGNOSIS — R55 Syncope and collapse: Secondary | ICD-10-CM

## 2014-10-29 DIAGNOSIS — I4891 Unspecified atrial fibrillation: Secondary | ICD-10-CM

## 2014-10-29 LAB — MDC_IDC_ENUM_SESS_TYPE_REMOTE
Battery Remaining Longevity: 85 mo
Battery Voltage: 2.98 V
Date Time Interrogation Session: 20160217103441
Lead Channel Impedance Value: 440 Ohm
Lead Channel Pacing Threshold Amplitude: 2.25 V
Lead Channel Pacing Threshold Pulse Width: 0.8 ms
Lead Channel Setting Pacing Pulse Width: 0.8 ms
MDC IDC MSMT BATTERY REMAINING PERCENTAGE: 91 %
MDC IDC MSMT LEADCHNL RV SENSING INTR AMPL: 6 mV
MDC IDC PG SERIAL: 2313421
MDC IDC SET LEADCHNL RV PACING AMPLITUDE: 4 V
MDC IDC SET LEADCHNL RV SENSING SENSITIVITY: 2 mV
MDC IDC STAT BRADY RV PERCENT PACED: 20 %

## 2014-10-29 NOTE — Progress Notes (Signed)
Remote pacemaker transmission.   

## 2014-10-31 ENCOUNTER — Ambulatory Visit: Payer: PRIVATE HEALTH INSURANCE | Admitting: Pharmacist Clinician (PhC)/ Clinical Pharmacy Specialist

## 2014-11-03 ENCOUNTER — Ambulatory Visit (INDEPENDENT_AMBULATORY_CARE_PROVIDER_SITE_OTHER): Payer: Medicare Other | Admitting: Pharmacist Clinician (PhC)/ Clinical Pharmacy Specialist

## 2014-11-03 DIAGNOSIS — I4891 Unspecified atrial fibrillation: Secondary | ICD-10-CM

## 2014-11-03 DIAGNOSIS — Z7901 Long term (current) use of anticoagulants: Secondary | ICD-10-CM

## 2014-11-03 LAB — POCT INR: INR: 2.8

## 2014-11-07 ENCOUNTER — Encounter: Payer: Self-pay | Admitting: *Deleted

## 2014-11-18 ENCOUNTER — Encounter: Payer: Self-pay | Admitting: Internal Medicine

## 2014-11-18 DIAGNOSIS — H2513 Age-related nuclear cataract, bilateral: Secondary | ICD-10-CM | POA: Diagnosis not present

## 2014-11-18 DIAGNOSIS — H4011X1 Primary open-angle glaucoma, mild stage: Secondary | ICD-10-CM | POA: Diagnosis not present

## 2014-11-18 DIAGNOSIS — H5203 Hypermetropia, bilateral: Secondary | ICD-10-CM | POA: Diagnosis not present

## 2014-11-24 ENCOUNTER — Telehealth: Payer: Self-pay

## 2014-11-24 ENCOUNTER — Other Ambulatory Visit: Payer: Self-pay

## 2014-11-24 DIAGNOSIS — J040 Acute laryngitis: Secondary | ICD-10-CM

## 2014-11-24 DIAGNOSIS — J209 Acute bronchitis, unspecified: Secondary | ICD-10-CM

## 2014-11-24 MED ORDER — BENAZEPRIL-HYDROCHLOROTHIAZIDE 20-12.5 MG PO TABS: 1.0000 | ORAL_TABLET | Freq: Every day | ORAL | Status: DC

## 2014-11-24 MED ORDER — WARFARIN SODIUM 5 MG PO TABS: ORAL_TABLET | ORAL | Status: DC

## 2014-11-24 NOTE — Telephone Encounter (Signed)
Patient called and asked for refill on benazepril 20/12.5 is this okay he hasnt been in awhile to you for med check but has been seeing cardiology regular please advise

## 2014-11-24 NOTE — Telephone Encounter (Signed)
rx 90 d supply sent to [facility]

## 2014-12-01 ENCOUNTER — Ambulatory Visit
Admission: RE | Admit: 2014-12-01 | Discharge: 2014-12-01 | Disposition: A | Discharge status: Home / Self Care | Payer: Medicare Other | Source: Ambulatory Visit | Attending: Family Medicine | Admitting: Family Medicine

## 2014-12-01 ENCOUNTER — Encounter: Payer: Self-pay | Admitting: Family Medicine

## 2014-12-01 ENCOUNTER — Ambulatory Visit (INDEPENDENT_AMBULATORY_CARE_PROVIDER_SITE_OTHER): Payer: Medicare Other | Admitting: Family Medicine

## 2014-12-01 VITALS — BP 118/80 | HR 69 | Wt 211.0 lb

## 2014-12-01 DIAGNOSIS — M1990 Unspecified osteoarthritis, unspecified site: Secondary | ICD-10-CM | POA: Diagnosis not present

## 2014-12-01 DIAGNOSIS — I739 Peripheral vascular disease, unspecified: Secondary | ICD-10-CM | POA: Diagnosis not present

## 2014-12-01 DIAGNOSIS — M1712 Unilateral primary osteoarthritis, left knee: Secondary | ICD-10-CM | POA: Diagnosis not present

## 2014-12-01 DIAGNOSIS — M25562 Pain in left knee: Secondary | ICD-10-CM | POA: Diagnosis not present

## 2014-12-01 NOTE — Progress Notes (Signed)
   Subjective:    Patient ID: [name], male    DOB: [birth year], 81 y.o.   MRN: [id number]  HPI He is here for consult concerning left knee pain. He admits to having less strength in both of his knees. When he stands for long periods of time he does have pain. He has had no swelling, locking, popping or grinding. He has a previous history of right knee pain with arthritis and steroid injection which did help. He is interested in having this done for the left knee.  Review of Systems     Objective:   Physical Exam Exam of the left knee shows no effusion or point tenderness over the joints. Negative anterior drawer and McMurray's testing. X-ray does show degenerative changes.      Assessment & Plan:  Left knee pain - Plan: DG Knee Complete 4 Views Left  Arthritis The knee was prepped with Betadine laterally and 40 mg of Kenalog and 2 mL of Xylocaine was injected into the articular space without difficulty. He did get relief of his symptoms.

## 2014-12-04 ENCOUNTER — Other Ambulatory Visit: Payer: Self-pay | Admitting: Family Medicine

## 2014-12-08 ENCOUNTER — Encounter: Payer: Self-pay | Admitting: Family Medicine

## 2014-12-08 ENCOUNTER — Ambulatory Visit (INDEPENDENT_AMBULATORY_CARE_PROVIDER_SITE_OTHER): Payer: Medicare Other | Admitting: Family Medicine

## 2014-12-08 VITALS — BP 128/70 | HR 70 | Wt 211.0 lb

## 2014-12-08 DIAGNOSIS — M129 Arthropathy, unspecified: Secondary | ICD-10-CM

## 2014-12-08 DIAGNOSIS — M1711 Unilateral primary osteoarthritis, right knee: Secondary | ICD-10-CM

## 2014-12-08 MED ORDER — TRIAMCINOLONE ACETONIDE 40 MG/ML IJ SUSP
40.0000 mg | Freq: Once | INTRAMUSCULAR | Status: AC
  Administered 2014-12-08: 40 mg via INTRAMUSCULAR

## 2014-12-08 MED ORDER — LIDOCAINE HCL 2 % IJ SOLN
3.0000 mL | Freq: Once | INTRAMUSCULAR | Status: AC
  Administered 2014-12-08: 60 mg via INTRADERMAL

## 2014-12-08 NOTE — Progress Notes (Signed)
   Subjective:    Patient ID: [name], male    DOB: April 19, 1931, 81 y.o.   MRN: [id number]  HPI He is here for consult concerning continued difficulty with right knee pain. He has a history of arthritis and has had previous injections into the knee for treatment of arthritis. Apparently the injections did help with his pain. He would like another injection. He does stand a lot in his part-time job.   Review of Systems     Objective:   Physical Exam Alert and in no distress. Review his record indicates previous x-rays showing arthritis. No swelling noted. Minimal lipping is noted medially. No crepitus, negative anterior drawer and McMurray's testing.       Assessment & Plan:  Arthritis of right knee - Plan: triamcinolone acetonide (KENALOG-40) injection 40 mg, lidocaine (XYLOCAINE) 2 % (with pres) injection 60 mg the lateral aspect of the left knee was prepped with Betadine. The joint line was marked with the next. The joint was injected with 40 mg of Kenalog and 3 mL of Xylocaine without difficulty. He did obtain relatively quick relief of his symptoms.

## 2014-12-15 ENCOUNTER — Ambulatory Visit (INDEPENDENT_AMBULATORY_CARE_PROVIDER_SITE_OTHER): Payer: Medicare Other | Admitting: Pharmacist Clinician (PhC)/ Clinical Pharmacy Specialist

## 2014-12-15 DIAGNOSIS — I4891 Unspecified atrial fibrillation: Secondary | ICD-10-CM

## 2014-12-15 DIAGNOSIS — Z7901 Long term (current) use of anticoagulants: Secondary | ICD-10-CM

## 2014-12-15 LAB — POCT INR: INR: 3.1

## 2015-01-26 ENCOUNTER — Ambulatory Visit: Payer: Medicare Other | Admitting: Pharmacist Clinician (PhC)/ Clinical Pharmacy Specialist

## 2015-01-28 ENCOUNTER — Ambulatory Visit (INDEPENDENT_AMBULATORY_CARE_PROVIDER_SITE_OTHER): Payer: Medicare Other | Admitting: *Deleted

## 2015-01-28 ENCOUNTER — Ambulatory Visit (INDEPENDENT_AMBULATORY_CARE_PROVIDER_SITE_OTHER): Payer: Medicare Other | Admitting: Pharmacist Clinician (PhC)/ Clinical Pharmacy Specialist

## 2015-01-28 DIAGNOSIS — Z7901 Long term (current) use of anticoagulants: Secondary | ICD-10-CM

## 2015-01-28 DIAGNOSIS — I4891 Unspecified atrial fibrillation: Secondary | ICD-10-CM | POA: Diagnosis not present

## 2015-01-28 LAB — POCT INR: INR: 4.7

## 2015-01-28 NOTE — Progress Notes (Signed)
Remote pacemaker transmission.   

## 2015-02-09 LAB — CUP PACEART REMOTE DEVICE CHECK
Battery Remaining Longevity: 85 mo
Battery Remaining Percentage: 91 %
Brady Statistic RV Percent Paced: 17 %
Date Time Interrogation Session: 20160518122949
Lead Channel Impedance Value: 430 Ohm
Lead Channel Pacing Threshold Pulse Width: 0.8 ms
Lead Channel Setting Pacing Pulse Width: 0.8 ms
Lead Channel Setting Sensing Sensitivity: 2 mV
MDC IDC MSMT BATTERY VOLTAGE: 2.98 V
MDC IDC MSMT LEADCHNL RV PACING THRESHOLD AMPLITUDE: 2.25 V
MDC IDC MSMT LEADCHNL RV SENSING INTR AMPL: 4.1 mV
MDC IDC SET LEADCHNL RV PACING AMPLITUDE: 4 V
Pulse Gen Model: 1110
Pulse Gen Serial Number: 2313421

## 2015-02-11 ENCOUNTER — Ambulatory Visit (INDEPENDENT_AMBULATORY_CARE_PROVIDER_SITE_OTHER): Payer: Medicare Other | Admitting: Pharmacist Clinician (PhC)/ Clinical Pharmacy Specialist

## 2015-02-11 DIAGNOSIS — I4891 Unspecified atrial fibrillation: Secondary | ICD-10-CM

## 2015-02-11 DIAGNOSIS — Z7901 Long term (current) use of anticoagulants: Secondary | ICD-10-CM

## 2015-02-11 LAB — POCT INR: INR: 2.4

## 2015-02-20 ENCOUNTER — Encounter: Payer: Self-pay | Admitting: Cardiology

## 2015-03-03 ENCOUNTER — Encounter: Payer: Self-pay | Admitting: Internal Medicine

## 2015-03-04 ENCOUNTER — Ambulatory Visit (INDEPENDENT_AMBULATORY_CARE_PROVIDER_SITE_OTHER): Payer: Medicare Other | Admitting: Pharmacist

## 2015-03-04 DIAGNOSIS — I4891 Unspecified atrial fibrillation: Secondary | ICD-10-CM | POA: Diagnosis not present

## 2015-03-04 DIAGNOSIS — Z7901 Long term (current) use of anticoagulants: Secondary | ICD-10-CM | POA: Diagnosis not present

## 2015-03-04 LAB — POCT INR: INR: 3.1

## 2015-03-23 ENCOUNTER — Ambulatory Visit (INDEPENDENT_AMBULATORY_CARE_PROVIDER_SITE_OTHER): Payer: Medicare Other | Admitting: Pharmacist Clinician (PhC)/ Clinical Pharmacy Specialist

## 2015-03-23 DIAGNOSIS — Z7901 Long term (current) use of anticoagulants: Secondary | ICD-10-CM

## 2015-03-23 DIAGNOSIS — I4891 Unspecified atrial fibrillation: Secondary | ICD-10-CM

## 2015-03-23 LAB — POCT INR: INR: 2.3

## 2015-03-26 ENCOUNTER — Encounter: Payer: Self-pay | Admitting: *Deleted

## 2015-04-24 ENCOUNTER — Encounter: Payer: Self-pay | Admitting: Cardiovascular Disease

## 2015-05-04 ENCOUNTER — Telehealth: Payer: Self-pay | Admitting: Cardiology

## 2015-05-04 ENCOUNTER — Ambulatory Visit (INDEPENDENT_AMBULATORY_CARE_PROVIDER_SITE_OTHER): Payer: Medicare Other | Admitting: Pharmacist Clinician (PhC)/ Clinical Pharmacy Specialist

## 2015-05-04 ENCOUNTER — Telehealth: Payer: Self-pay | Admitting: Internal Medicine

## 2015-05-04 ENCOUNTER — Encounter: Payer: Medicare Other | Admitting: *Deleted

## 2015-05-04 DIAGNOSIS — I4891 Unspecified atrial fibrillation: Secondary | ICD-10-CM | POA: Diagnosis not present

## 2015-05-04 DIAGNOSIS — Z7901 Long term (current) use of anticoagulants: Secondary | ICD-10-CM | POA: Diagnosis not present

## 2015-05-04 LAB — POCT INR: INR: 2.5

## 2015-05-04 NOTE — Telephone Encounter (Signed)
Attempted to help pt trouble shoot monitor but pt had already reset monitor x3. Instructed pt to call tech services. Pt agreed w/ this plan.

## 2015-05-04 NOTE — Telephone Encounter (Signed)
Spoke with pt and reminded pt of remote transmission that is due today. Pt verbalized understanding.   

## 2015-05-04 NOTE — Telephone Encounter (Signed)
New message     Need help sending a remote transmission

## 2015-05-04 NOTE — Telephone Encounter (Signed)
LMOVM for pt to return call 

## 2015-05-05 ENCOUNTER — Telehealth: Payer: Self-pay | Admitting: Cardiology

## 2015-05-05 ENCOUNTER — Encounter: Payer: Self-pay | Admitting: Cardiology

## 2015-05-05 NOTE — Telephone Encounter (Signed)
Pt called and stated that he will be getting a new home monitor from [employer] b/c his current monitor is not functioning. Pt will send remote transmission when he receives his new monitor. Pt ok with this plan.

## 2015-05-11 ENCOUNTER — Other Ambulatory Visit: Payer: Self-pay | Admitting: *Deleted

## 2015-05-11 ENCOUNTER — Telehealth: Payer: Self-pay | Admitting: *Deleted

## 2015-05-11 MED ORDER — DOXAZOSIN MESYLATE 4 MG PO TABS: 4.0000 mg | ORAL_TABLET | Freq: Every day | ORAL | Status: DC

## 2015-05-11 MED ORDER — CARVEDILOL 3.125 MG PO TABS: ORAL_TABLET | ORAL | Status: DC

## 2015-05-11 NOTE — Telephone Encounter (Signed)
Pt calling to verify remote received. Pt states he did get "stars." At time of phone call, remote had not been received. Pt aware we will contact him if we did not receive transmission.

## 2015-05-19 ENCOUNTER — Other Ambulatory Visit (INDEPENDENT_AMBULATORY_CARE_PROVIDER_SITE_OTHER): Payer: Medicare Other

## 2015-05-19 DIAGNOSIS — Z23 Encounter for immunization: Secondary | ICD-10-CM

## 2015-05-20 DIAGNOSIS — H4011X1 Primary open-angle glaucoma, mild stage: Secondary | ICD-10-CM | POA: Diagnosis not present

## 2015-05-20 DIAGNOSIS — H25013 Cortical age-related cataract, bilateral: Secondary | ICD-10-CM | POA: Diagnosis not present

## 2015-05-25 ENCOUNTER — Other Ambulatory Visit: Payer: Self-pay | Admitting: Internal Medicine

## 2015-05-25 ENCOUNTER — Other Ambulatory Visit: Payer: Self-pay | Admitting: Cardiovascular Disease

## 2015-06-15 ENCOUNTER — Ambulatory Visit (INDEPENDENT_AMBULATORY_CARE_PROVIDER_SITE_OTHER): Payer: Medicare Other | Admitting: Pharmacist Clinician (PhC)/ Clinical Pharmacy Specialist

## 2015-06-15 DIAGNOSIS — Z7901 Long term (current) use of anticoagulants: Secondary | ICD-10-CM

## 2015-06-15 DIAGNOSIS — I4891 Unspecified atrial fibrillation: Secondary | ICD-10-CM | POA: Diagnosis not present

## 2015-06-15 LAB — POCT INR: INR: 2.4

## 2015-06-16 ENCOUNTER — Encounter: Payer: Self-pay | Admitting: Family Medicine

## 2015-06-16 ENCOUNTER — Ambulatory Visit (INDEPENDENT_AMBULATORY_CARE_PROVIDER_SITE_OTHER): Payer: Medicare Other | Admitting: Family Medicine

## 2015-06-16 VITALS — BP 120/90 | HR 68 | Ht 65.5 in | Wt 203.0 lb

## 2015-06-16 DIAGNOSIS — Z950 Presence of cardiac pacemaker: Secondary | ICD-10-CM

## 2015-06-16 DIAGNOSIS — R269 Unspecified abnormalities of gait and mobility: Secondary | ICD-10-CM | POA: Diagnosis not present

## 2015-06-16 DIAGNOSIS — Z0000 Encounter for general adult medical examination without abnormal findings: Secondary | ICD-10-CM | POA: Diagnosis not present

## 2015-06-16 DIAGNOSIS — H409 Unspecified glaucoma: Secondary | ICD-10-CM | POA: Diagnosis not present

## 2015-06-16 DIAGNOSIS — Z7901 Long term (current) use of anticoagulants: Secondary | ICD-10-CM | POA: Diagnosis not present

## 2015-06-16 DIAGNOSIS — H269 Unspecified cataract: Secondary | ICD-10-CM | POA: Diagnosis not present

## 2015-06-16 DIAGNOSIS — E785 Hyperlipidemia, unspecified: Secondary | ICD-10-CM

## 2015-06-16 DIAGNOSIS — M129 Arthropathy, unspecified: Secondary | ICD-10-CM | POA: Diagnosis not present

## 2015-06-16 DIAGNOSIS — M1990 Unspecified osteoarthritis, unspecified site: Secondary | ICD-10-CM | POA: Diagnosis not present

## 2015-06-16 DIAGNOSIS — R5383 Other fatigue: Secondary | ICD-10-CM

## 2015-06-16 DIAGNOSIS — I10 Essential (primary) hypertension: Secondary | ICD-10-CM | POA: Diagnosis not present

## 2015-06-16 DIAGNOSIS — Z23 Encounter for immunization: Secondary | ICD-10-CM

## 2015-06-16 LAB — CBC WITH DIFFERENTIAL/PLATELET
BASOS PCT: 1 % (ref 0–1)
Basophils Absolute: 0.1 10*3/uL (ref 0.0–0.1)
EOS PCT: 8 % — AB (ref 0–5)
Eosinophils Absolute: 0.4 10*3/uL (ref 0.0–0.7)
HEMATOCRIT: 45.1 % (ref 39.0–52.0)
HEMOGLOBIN: 14.8 g/dL (ref 13.0–17.0)
Lymphocytes Relative: 24 % (ref 12–46)
Lymphs Abs: 1.3 10*3/uL (ref 0.7–4.0)
MCH: 30.3 pg (ref 26.0–34.0)
MCHC: 32.8 g/dL (ref 30.0–36.0)
MCV: 92.2 fL (ref 78.0–100.0)
MONO ABS: 0.5 10*3/uL (ref 0.1–1.0)
MONOS PCT: 10 % (ref 3–12)
MPV: 9.6 fL (ref 8.6–12.4)
NEUTROS ABS: 3.1 10*3/uL (ref 1.7–7.7)
Neutrophils Relative %: 57 % (ref 43–77)
Platelets: 129 10*3/uL — ABNORMAL LOW (ref 150–400)
RBC: 4.89 MIL/uL (ref 4.22–5.81)
RDW: 14.4 % (ref 11.5–15.5)
WBC: 5.4 10*3/uL (ref 4.0–10.5)

## 2015-06-16 LAB — LIPID PANEL
CHOL/HDL RATIO: 3.7 ratio (ref <=5.0)
Cholesterol: 114 mg/dL — ABNORMAL LOW (ref 125–200)
HDL: 31 mg/dL — AB (ref >=40)
LDL CALC: 64 mg/dL (ref <130)
TRIGLYCERIDES: 97 mg/dL (ref <150)
VLDL: 19 mg/dL (ref <30)

## 2015-06-16 LAB — COMPREHENSIVE METABOLIC PANEL
ALBUMIN: 3.9 g/dL (ref 3.6–5.1)
ALT: 14 U/L (ref 9–46)
AST: 20 U/L (ref 10–35)
Alkaline Phosphatase: 60 U/L (ref 40–115)
BUN: 22 mg/dL (ref 7–25)
CALCIUM: 9.6 mg/dL (ref 8.6–10.3)
CHLORIDE: 104 mmol/L (ref 98–110)
CO2: 28 mmol/L (ref 20–31)
Creat: 1.51 mg/dL — ABNORMAL HIGH (ref 0.70–1.11)
GLUCOSE: 79 mg/dL (ref 65–99)
Potassium: 4.3 mmol/L (ref 3.5–5.3)
Sodium: 139 mmol/L (ref 135–146)
Total Bilirubin: 1.1 mg/dL (ref 0.2–1.2)
Total Protein: 6.5 g/dL (ref 6.1–8.1)

## 2015-06-16 NOTE — Progress Notes (Signed)
   Subjective:    Patient ID: [name], male    DOB: 07-05-1931, 81 y.o.   MRN: [id number]  HPI He is here for complete examination. His main complaint today is feeling fatigued. He does have difficulty with arthritis which interferes with his physical activity. He does complain of some instability while walking He also has underlying atrial fibrillation and does have an pacer in. He is followed regularly by cardiology. He does have bilateral cataracts but at this time is not interested in surgery. He also has a history of glaucoma. He continues on medications listed in the chart. These were reviewed. Also has a previous history of shingles for 5 years ago. He continues on chronic anticoagulation and is doing well on his Coumadin. He is retired but does keep quite busy. He has not had any difficulty with chest pain, shortness of breath, abdominal pain.   Review of Systems  All other systems reviewed and are negative.      Objective:   Physical Exam Alert and in no distress. Tympanic membranes and canals are normal. Pharyngeal area is normal. Neck is supple without adenopathy or thyromegaly. Cardiac exam shows a regular sinus rhythm without murmurs or gallops. Lungs are clear to auscultation. Abdominal exam shows no masses or tenderness.        Assessment & Plan:  Routine general medical examination at a health care facility - Plan: CBC with Differential/Platelet, Comprehensive metabolic panel  Need for prophylactic vaccination against Streptococcus pneumoniae (pneumococcus) - Plan: CANCELED: Pneumococcal conjugate vaccine 13-valent  PACEMAKER-St.Jude  Cataracts, bilateral  Glaucoma  Hyperlipidemia LDL goal <70 - Plan: Lipid panel  Essential hypertension - Plan: CBC with Differential/Platelet, Comprehensive metabolic panel  Long term (current) use of anticoagulants  Abnormality of gait  Arthritis - Plan: CBC with Differential/Platelet, Comprehensive metabolic  panel  Other fatigue - Plan: CBC with Differential/Platelet, Comprehensive metabolic panel Strongly encouraged him to get more physically active. Also discussed weight loss as a way to help with his arthritis symptoms which are mainly in his knees. He'll continue on his present medication regimen. He will continue to be followed by cardiology.

## 2015-06-18 ENCOUNTER — Other Ambulatory Visit: Payer: Self-pay

## 2015-06-25 ENCOUNTER — Other Ambulatory Visit (INDEPENDENT_AMBULATORY_CARE_PROVIDER_SITE_OTHER): Payer: Medicare Other

## 2015-06-25 DIAGNOSIS — Z23 Encounter for immunization: Secondary | ICD-10-CM

## 2015-07-06 ENCOUNTER — Ambulatory Visit: Payer: Medicare Other | Admitting: Family Medicine

## 2015-07-06 ENCOUNTER — Other Ambulatory Visit: Payer: Self-pay | Admitting: Internal Medicine

## 2015-07-08 ENCOUNTER — Encounter: Payer: Self-pay | Admitting: Family Medicine

## 2015-07-08 ENCOUNTER — Ambulatory Visit (INDEPENDENT_AMBULATORY_CARE_PROVIDER_SITE_OTHER): Payer: Medicare Other | Admitting: Family Medicine

## 2015-07-08 VITALS — BP 150/90 | HR 61 | Wt 206.0 lb

## 2015-07-08 DIAGNOSIS — M1990 Unspecified osteoarthritis, unspecified site: Secondary | ICD-10-CM

## 2015-07-08 MED ORDER — TRIAMCINOLONE ACETONIDE 40 MG/ML IJ SUSP
40.0000 mg | Freq: Once | INTRAMUSCULAR | Status: AC
  Administered 2015-07-08: 40 mg via INTRAMUSCULAR

## 2015-07-08 MED ORDER — LIDOCAINE HCL (PF) 2 % IJ SOLN
3.0000 mL | Freq: Once | INTRAMUSCULAR | Status: AC
  Administered 2015-07-08: 3 mL

## 2015-07-08 NOTE — Progress Notes (Signed)
   Subjective:    Patient ID: [name], male    DOB: 01-03-1931, 81 y.o.   MRN: [id number]  HPI He is here for evaluation of bilateral knee pain. He does have evidence of degenerative arthritic changes. He did have a right knee injection in March of this year which did help tremendously with the pain. He now is having right greater than left knee pain. He would like injections.   Review of Systems     Objective:   Physical Exam Alert and in no distress otherwise not examined       Assessment & Plan:  Arthritis  knees were prepped laterally with Betadine. The joint line was identified and 40 mg of Kenalog and 3 mL of Xylocaine was injected into the joint spaces. It was slightly difficult to inject but he did get relief bilaterally. On injection into the left knee there was some local bleeding which was adequately handled with only minimal hematoma present on the skin. Return here if further difficulty. Discussed continued use of steroids however if it gives him 7+ months of relief this is pretty good.

## 2015-07-08 NOTE — Addendum Note (Signed)
Addended by: Minette Headland A on: 07/08/2015 02:42 PM   Modules accepted: Orders

## 2015-07-14 ENCOUNTER — Encounter: Payer: Medicare Other | Admitting: Family Medicine

## 2015-07-21 ENCOUNTER — Other Ambulatory Visit: Payer: Self-pay | Admitting: Family Medicine

## 2015-07-27 ENCOUNTER — Ambulatory Visit (INDEPENDENT_AMBULATORY_CARE_PROVIDER_SITE_OTHER): Payer: Medicare Other | Admitting: Pharmacist Clinician (PhC)/ Clinical Pharmacy Specialist

## 2015-07-27 DIAGNOSIS — Z7901 Long term (current) use of anticoagulants: Secondary | ICD-10-CM

## 2015-07-27 DIAGNOSIS — I4891 Unspecified atrial fibrillation: Secondary | ICD-10-CM

## 2015-07-27 LAB — POCT INR: INR: 2.6

## 2015-07-28 ENCOUNTER — Encounter: Payer: Self-pay | Admitting: *Deleted

## 2015-07-31 ENCOUNTER — Ambulatory Visit (INDEPENDENT_AMBULATORY_CARE_PROVIDER_SITE_OTHER): Payer: Medicare Other | Admitting: Internal Medicine

## 2015-07-31 ENCOUNTER — Encounter: Payer: Self-pay | Admitting: Internal Medicine

## 2015-07-31 VITALS — BP 138/88 | HR 57 | Ht 66.0 in | Wt 202.2 lb

## 2015-07-31 DIAGNOSIS — I482 Chronic atrial fibrillation, unspecified: Secondary | ICD-10-CM

## 2015-07-31 DIAGNOSIS — Z950 Presence of cardiac pacemaker: Secondary | ICD-10-CM | POA: Diagnosis not present

## 2015-07-31 DIAGNOSIS — I10 Essential (primary) hypertension: Secondary | ICD-10-CM | POA: Diagnosis not present

## 2015-07-31 LAB — CUP PACEART INCLINIC DEVICE CHECK
Battery Remaining Longevity: 121.2
Battery Voltage: 2.98 V
Brady Statistic RV Percent Paced: 21 %
Implantable Lead Implant Date: 20040429
Implantable Lead Location: 753860
Lead Channel Setting Pacing Amplitude: 4 V
Lead Channel Setting Pacing Pulse Width: 0.8 ms
MDC IDC MSMT LEADCHNL RV IMPEDANCE VALUE: 475 Ohm
MDC IDC MSMT LEADCHNL RV PACING THRESHOLD AMPLITUDE: 2 V
MDC IDC MSMT LEADCHNL RV PACING THRESHOLD PULSEWIDTH: 0.8 ms
MDC IDC MSMT LEADCHNL RV SENSING INTR AMPL: 7.9 mV
MDC IDC PG SERIAL: 2313421
MDC IDC SESS DTM: 20161118123503
MDC IDC SET LEADCHNL RV SENSING SENSITIVITY: 2 mV
Pulse Gen Model: 1110

## 2015-07-31 NOTE — Progress Notes (Signed)
HPI [name] returns today for followup. He is a 81 year old man with hypertension, atrial fibrillation, syncope, and bradycardia, status post permanent pacemaker insertion. In the interim, he has done well except for some arthritis. He denies chest pain. He has dyspnea with exertion.  No peripheral edema.  He denies syncope.   No Known Allergies   Current Outpatient Prescriptions  Medication Sig Dispense Refill  . benazepril-hydrochlorthiazide (LOTENSIN HCT) 20-12.5 MG per tablet Take 1 tablet by mouth daily. 90 tablet 3  . carvedilol (COREG) 3.125 MG tablet Take 1 tablet by mouth two  times daily 180 tablet 0  . doxazosin (CARDURA) 4 MG tablet Take 1 tablet by mouth at  bedtime 90 tablet 0  . latanoprost (XALATAN) 0.005 % ophthalmic solution Place 1 drop into both eyes at bedtime.      . Multiple Vitamin (MULTI VITAMIN MENS PO) Take 1 capsule by mouth daily.     . Multiple Vitamins-Minerals (PRESERVISION AREDS 2 PO) Take 2 capsules by mouth daily. I-CAPS    . potassium chloride SA (K-DUR,KLOR-CON) 20 MEQ tablet Take 0.5 tablets (10 mEq total) by mouth daily. 90 tablet 3  . simvastatin (ZOCOR) 20 MG tablet Take 1 tablet by mouth at  bedtime 90 tablet 0  . Vitamins A & D (VITAMIN A & D) 5000-400 UNITS CAPS Take 1 capsule by mouth daily.     Marland Kitchen warfarin (COUMADIN) 5 MG tablet TAKE 1 TABLET BY MOUTH  EVERY DAY OR AS DIRECTED 90 tablet 1   No current facility-administered medications for this visit.   Facility-Administered Medications Ordered in Other Visits  Medication Dose Route Frequency Provider Last Rate Last Dose  . influenza  inactive virus vaccine (FLUZONE/FLUARIX) injection 0.5 mL  0.5 mL Intramuscular Once Rita Ohara, MD         Past Medical History  Diagnosis Date  . Hypertension   . GERD (gastroesophageal reflux disease)   . Diverticulosis   . Gout   . Obesity   . ED (erectile dysfunction)   . Chronic kidney disease     RENAL INSUFFICIENCY  . Glaucoma   . Heart disorder    . CAD (coronary artery disease)     GXT, neg bruce protocol GXT  . A-fib ([place])     ROS:   All systems reviewed and negative except as noted in the HPI.   Past Surgical History  Procedure Laterality Date  . Pacemaker insertion  01/09/03     Family History  Problem Relation Age of Onset  . Heart disease Mother   . Hypertension Mother   . Kidney disease Mother   . Stroke Mother   . Heart disease Father      Social History   Social History  . Marital Status: Married    Spouse Name: N/A  . Number of Children: 2  . Years of Education: S-College   Occupational History  .      Works part time   Social History Main Topics  . Smoking status: Former Smoker    Quit date: 09/13/1979  . Smokeless tobacco: Not on file  . Alcohol Use: Yes     Comment: Consumes 2-3 glasses of wine per week  . Drug Use: No  . Sexual Activity: Not on file   Other Topics Concern  . Not on file   Social History Narrative     BP 138/88 mmHg  Pulse 57  Ht 5\' 6"  (1.676 m)  Wt 202 lb 3.2 oz (91.717 kg)  BMI 32.65 kg/m2  Physical Exam:  Well appearing 81 year old man, NAD HEENT: Unremarkable Neck:  7 cm JVD, no thyromegally Lungs:  Clear with no wheezes, rales, or rhonchi. HEART:  IRegular rate rhythm, no murmurs, no rubs, no clicks Abd:  soft, positive bowel sounds, no organomegally, no rebound, no guarding Ext:  2 plus pulses, no edema, no cyanosis, no clubbing Skin:  No rashes no nodules Neuro:  CN II through XII intact, motor grossly intact  DEVICE  Normal device function.  See PaceArt for details.   Assess/Plan:

## 2015-07-31 NOTE — Patient Instructions (Signed)
Medication Instructions:  Your physician recommends that you continue on your current medications as directed. Please refer to the Current Medication list given to you today.   Labwork: None ordered   Testing/Procedures: None ordered   Follow-Up: Your physician wants you to follow-up in: 12 months with Dr Knox Saliva will receive a reminder letter in the mail two months in advance. If you don't receive a letter, please call our office to schedule the follow-up appointment.  Remote monitoring is used to monitor your Pacemaker  from home. This monitoring reduces the number of office visits required to check your device to one time per year. It allows [place] to keep an eye on the functioning of your device to ensure it is working properly. You are scheduled for a device check from home on 11/02/15. You may send your transmission at any time that day. If you have a wireless device, the transmission will be sent automatically. After your physician reviews your transmission, you will receive a postcard with your next transmission date.     Any Other Special Instructions Will Be Listed Below (If Applicable).     If you need a refill on your cardiac medications before your next appointment, please call your pharmacy.

## 2015-07-31 NOTE — Assessment & Plan Note (Signed)
His blood pressure remains slightly elevated. He is strongly encouraged to lose weight.

## 2015-07-31 NOTE — Assessment & Plan Note (Signed)
His ventricular rate is well controlled. Will follow.  

## 2015-07-31 NOTE — Assessment & Plan Note (Signed)
His St.Jude VVI PM is working normally. Will recheck in several months. 

## 2015-08-26 ENCOUNTER — Other Ambulatory Visit: Payer: Self-pay | Admitting: Family Medicine

## 2015-08-26 ENCOUNTER — Other Ambulatory Visit: Payer: Self-pay | Admitting: *Deleted

## 2015-08-26 MED ORDER — DOXAZOSIN MESYLATE 4 MG PO TABS: ORAL_TABLET | ORAL | Status: DC

## 2015-09-09 ENCOUNTER — Ambulatory Visit: Payer: Medicare Other | Admitting: Pharmacist Clinician (PhC)/ Clinical Pharmacy Specialist

## 2015-09-10 ENCOUNTER — Ambulatory Visit (INDEPENDENT_AMBULATORY_CARE_PROVIDER_SITE_OTHER): Payer: Medicare Other | Admitting: Pharmacist Clinician (PhC)/ Clinical Pharmacy Specialist

## 2015-09-10 DIAGNOSIS — Z7901 Long term (current) use of anticoagulants: Secondary | ICD-10-CM

## 2015-09-10 DIAGNOSIS — I482 Chronic atrial fibrillation, unspecified: Secondary | ICD-10-CM

## 2015-09-10 DIAGNOSIS — I4891 Unspecified atrial fibrillation: Secondary | ICD-10-CM

## 2015-09-10 LAB — POCT INR: INR: 1.9

## 2015-09-16 ENCOUNTER — Ambulatory Visit (INDEPENDENT_AMBULATORY_CARE_PROVIDER_SITE_OTHER): Payer: Medicare Other | Admitting: Family Medicine

## 2015-09-16 VITALS — BP 130/86 | Wt 204.0 lb

## 2015-09-16 DIAGNOSIS — M1990 Unspecified osteoarthritis, unspecified site: Secondary | ICD-10-CM | POA: Diagnosis not present

## 2015-09-16 NOTE — Progress Notes (Signed)
   Subjective:    Patient ID: [name]., male    DOB: 1931/06/16, 82 y.o.   MRN: [id number]  HPI He is here for consult concerning continued difficulty with bilateral knee pain. He has had injections into his knee the most recent one was late October. The knee is now giving him trouble again. He has had x-rays on both knees which does show medial compartment damage.   Review of Systems     Objective:   Physical Exam Alert and in no distress otherwise not examined       Assessment & Plan:  Arthritis  the x-rays were reviewed with him. Since he is having medial joint symptoms, I have recommended that he see his orthopedic surgeon talk to him about having partial knee replacements done and to discuss the possibility of giving them both done at the same time.

## 2015-09-24 DIAGNOSIS — M1711 Unilateral primary osteoarthritis, right knee: Secondary | ICD-10-CM | POA: Diagnosis not present

## 2015-09-24 DIAGNOSIS — M1712 Unilateral primary osteoarthritis, left knee: Secondary | ICD-10-CM | POA: Diagnosis not present

## 2015-09-24 DIAGNOSIS — M170 Bilateral primary osteoarthritis of knee: Secondary | ICD-10-CM | POA: Diagnosis not present

## 2015-09-28 ENCOUNTER — Telehealth: Payer: Self-pay

## 2015-09-28 ENCOUNTER — Other Ambulatory Visit: Payer: Self-pay

## 2015-09-28 MED ORDER — SIMVASTATIN 20 MG PO TABS: ORAL_TABLET | ORAL | Status: DC

## 2015-09-28 MED ORDER — CARVEDILOL 3.125 MG PO TABS: ORAL_TABLET | ORAL | Status: DC

## 2015-09-28 NOTE — Telephone Encounter (Signed)
Evans Lance, MD at 07/31/2015 10:09 AM  carvedilol (COREG) 3.125 MG tabletTake 1 tablet by mouth two times daily Patient Instructions     Medication Instructions:  Your physician recommends that you continue on your current medications as directed. Please refer to the Current Medication list given to you today.

## 2015-09-28 NOTE — Telephone Encounter (Signed)
[facility] sent a request for Simvastatin 20mg  #90

## 2015-10-21 ENCOUNTER — Ambulatory Visit (INDEPENDENT_AMBULATORY_CARE_PROVIDER_SITE_OTHER): Payer: Medicare Other | Admitting: Pharmacist Clinician (PhC)/ Clinical Pharmacy Specialist

## 2015-10-21 DIAGNOSIS — I482 Chronic atrial fibrillation, unspecified: Secondary | ICD-10-CM

## 2015-10-21 DIAGNOSIS — Z7901 Long term (current) use of anticoagulants: Secondary | ICD-10-CM | POA: Diagnosis not present

## 2015-10-21 DIAGNOSIS — I4891 Unspecified atrial fibrillation: Secondary | ICD-10-CM | POA: Diagnosis not present

## 2015-10-21 LAB — POCT INR: INR: 2.4

## 2015-10-26 DIAGNOSIS — M1712 Unilateral primary osteoarthritis, left knee: Secondary | ICD-10-CM | POA: Diagnosis not present

## 2015-10-26 DIAGNOSIS — M1711 Unilateral primary osteoarthritis, right knee: Secondary | ICD-10-CM | POA: Diagnosis not present

## 2015-10-26 DIAGNOSIS — M170 Bilateral primary osteoarthritis of knee: Secondary | ICD-10-CM | POA: Diagnosis not present

## 2015-11-02 ENCOUNTER — Telehealth: Payer: Self-pay | Admitting: Cardiology

## 2015-11-02 ENCOUNTER — Encounter: Payer: Medicare Other | Admitting: *Deleted

## 2015-11-02 DIAGNOSIS — M1712 Unilateral primary osteoarthritis, left knee: Secondary | ICD-10-CM | POA: Diagnosis not present

## 2015-11-02 DIAGNOSIS — M1711 Unilateral primary osteoarthritis, right knee: Secondary | ICD-10-CM | POA: Diagnosis not present

## 2015-11-02 DIAGNOSIS — M170 Bilateral primary osteoarthritis of knee: Secondary | ICD-10-CM | POA: Diagnosis not present

## 2015-11-02 NOTE — Telephone Encounter (Signed)
Spoke with pt and reminded pt of remote transmission that is due today. Pt verbalized understanding.   

## 2015-11-04 ENCOUNTER — Encounter: Payer: Self-pay | Admitting: Cardiology

## 2015-11-09 DIAGNOSIS — M1712 Unilateral primary osteoarthritis, left knee: Secondary | ICD-10-CM | POA: Diagnosis not present

## 2015-11-09 DIAGNOSIS — M170 Bilateral primary osteoarthritis of knee: Secondary | ICD-10-CM | POA: Diagnosis not present

## 2015-11-09 DIAGNOSIS — M1711 Unilateral primary osteoarthritis, right knee: Secondary | ICD-10-CM | POA: Diagnosis not present

## 2015-11-12 ENCOUNTER — Telehealth: Payer: Self-pay | Admitting: Internal Medicine

## 2015-11-12 ENCOUNTER — Ambulatory Visit (INDEPENDENT_AMBULATORY_CARE_PROVIDER_SITE_OTHER): Payer: Medicare Other | Admitting: *Deleted

## 2015-11-12 DIAGNOSIS — I4891 Unspecified atrial fibrillation: Secondary | ICD-10-CM | POA: Diagnosis not present

## 2015-11-12 DIAGNOSIS — Z950 Presence of cardiac pacemaker: Secondary | ICD-10-CM | POA: Diagnosis not present

## 2015-11-12 NOTE — Telephone Encounter (Signed)
Pt said he need to talk to somebody in the device clinic.

## 2015-11-12 NOTE — Telephone Encounter (Signed)
Spoke w/ pt and instructed him how to send manual transmission. Pt verbalized understanding.  

## 2015-11-13 ENCOUNTER — Other Ambulatory Visit: Payer: Self-pay | Admitting: Family Medicine

## 2015-11-13 NOTE — Progress Notes (Signed)
Remote pacemaker transmission.   

## 2015-11-17 DIAGNOSIS — H401131 Primary open-angle glaucoma, bilateral, mild stage: Secondary | ICD-10-CM | POA: Diagnosis not present

## 2015-11-21 LAB — CUP PACEART REMOTE DEVICE CHECK
Battery Voltage: 2.98 V
Implantable Lead Implant Date: 20040429
Implantable Lead Location: 753860
Lead Channel Pacing Threshold Pulse Width: 0.8 ms
Lead Channel Sensing Intrinsic Amplitude: 4.5 mV
Lead Channel Setting Pacing Pulse Width: 0.8 ms
MDC IDC MSMT BATTERY REMAINING LONGEVITY: 99 mo
MDC IDC MSMT BATTERY REMAINING PERCENTAGE: 95.5 %
MDC IDC MSMT LEADCHNL RV IMPEDANCE VALUE: 440 Ohm
MDC IDC MSMT LEADCHNL RV PACING THRESHOLD AMPLITUDE: 2 V
MDC IDC SESS DTM: 20170302152056
MDC IDC SET LEADCHNL RV PACING AMPLITUDE: 4 V
MDC IDC SET LEADCHNL RV SENSING SENSITIVITY: 2 mV
MDC IDC STAT BRADY RV PERCENT PACED: 14 %
Pulse Gen Serial Number: 2313421

## 2015-11-21 NOTE — Progress Notes (Signed)
Normal remote reviewed.  Next Merlin 02/11/16 

## 2015-11-23 ENCOUNTER — Other Ambulatory Visit: Payer: Self-pay | Admitting: Cardiovascular Disease

## 2015-11-25 ENCOUNTER — Encounter: Payer: Self-pay | Admitting: Cardiology

## 2015-12-01 ENCOUNTER — Ambulatory Visit (INDEPENDENT_AMBULATORY_CARE_PROVIDER_SITE_OTHER): Payer: Medicare Other | Admitting: Pharmacist Clinician (PhC)/ Clinical Pharmacy Specialist

## 2015-12-01 DIAGNOSIS — I4891 Unspecified atrial fibrillation: Secondary | ICD-10-CM | POA: Diagnosis not present

## 2015-12-01 DIAGNOSIS — I482 Chronic atrial fibrillation, unspecified: Secondary | ICD-10-CM

## 2015-12-01 DIAGNOSIS — Z7901 Long term (current) use of anticoagulants: Secondary | ICD-10-CM | POA: Diagnosis not present

## 2015-12-01 LAB — POCT INR: INR: 1.9

## 2015-12-23 DIAGNOSIS — M1711 Unilateral primary osteoarthritis, right knee: Secondary | ICD-10-CM | POA: Diagnosis not present

## 2015-12-23 DIAGNOSIS — M25561 Pain in right knee: Secondary | ICD-10-CM | POA: Diagnosis not present

## 2015-12-23 DIAGNOSIS — M1712 Unilateral primary osteoarthritis, left knee: Secondary | ICD-10-CM | POA: Diagnosis not present

## 2015-12-23 DIAGNOSIS — M170 Bilateral primary osteoarthritis of knee: Secondary | ICD-10-CM | POA: Diagnosis not present

## 2016-01-13 ENCOUNTER — Ambulatory Visit (INDEPENDENT_AMBULATORY_CARE_PROVIDER_SITE_OTHER): Payer: Medicare Other | Admitting: Pharmacist

## 2016-01-13 DIAGNOSIS — Z7901 Long term (current) use of anticoagulants: Secondary | ICD-10-CM | POA: Diagnosis not present

## 2016-01-13 DIAGNOSIS — I482 Chronic atrial fibrillation, unspecified: Secondary | ICD-10-CM

## 2016-01-13 DIAGNOSIS — I4891 Unspecified atrial fibrillation: Secondary | ICD-10-CM | POA: Diagnosis not present

## 2016-01-13 LAB — POCT INR: INR: 2.8

## 2016-02-11 ENCOUNTER — Ambulatory Visit (INDEPENDENT_AMBULATORY_CARE_PROVIDER_SITE_OTHER): Payer: Medicare Other | Admitting: *Deleted

## 2016-02-11 DIAGNOSIS — I4891 Unspecified atrial fibrillation: Secondary | ICD-10-CM | POA: Diagnosis not present

## 2016-02-11 DIAGNOSIS — Z950 Presence of cardiac pacemaker: Secondary | ICD-10-CM

## 2016-02-11 NOTE — Progress Notes (Signed)
Remote pacemaker transmission.   

## 2016-02-24 ENCOUNTER — Ambulatory Visit (INDEPENDENT_AMBULATORY_CARE_PROVIDER_SITE_OTHER): Payer: Medicare Other | Admitting: Pharmacist

## 2016-02-24 DIAGNOSIS — Z7901 Long term (current) use of anticoagulants: Secondary | ICD-10-CM | POA: Diagnosis not present

## 2016-02-24 DIAGNOSIS — I4891 Unspecified atrial fibrillation: Secondary | ICD-10-CM | POA: Diagnosis not present

## 2016-02-24 LAB — POCT INR: INR: 2.9

## 2016-02-26 LAB — CUP PACEART REMOTE DEVICE CHECK
Battery Voltage: 2.96 V
Brady Statistic RV Percent Paced: 14 %
Implantable Lead Implant Date: 20040429
Implantable Lead Location: 753860
Lead Channel Pacing Threshold Amplitude: 2 V
Lead Channel Setting Pacing Pulse Width: 0.8 ms
MDC IDC MSMT BATTERY REMAINING LONGEVITY: 94 mo
MDC IDC MSMT BATTERY REMAINING PERCENTAGE: 95.5 %
MDC IDC MSMT LEADCHNL RV IMPEDANCE VALUE: 460 Ohm
MDC IDC MSMT LEADCHNL RV PACING THRESHOLD PULSEWIDTH: 0.8 ms
MDC IDC MSMT LEADCHNL RV SENSING INTR AMPL: 5.4 mV
MDC IDC SESS DTM: 20170601145825
MDC IDC SET LEADCHNL RV PACING AMPLITUDE: 4 V
MDC IDC SET LEADCHNL RV SENSING SENSITIVITY: 2 mV
Pulse Gen Model: 1110
Pulse Gen Serial Number: 2313421

## 2016-03-02 ENCOUNTER — Encounter: Payer: Self-pay | Admitting: Cardiology

## 2016-04-06 ENCOUNTER — Ambulatory Visit (INDEPENDENT_AMBULATORY_CARE_PROVIDER_SITE_OTHER): Payer: Medicare Other | Admitting: Pharmacist Clinician (PhC)/ Clinical Pharmacy Specialist

## 2016-04-06 DIAGNOSIS — Z7901 Long term (current) use of anticoagulants: Secondary | ICD-10-CM | POA: Diagnosis not present

## 2016-04-06 DIAGNOSIS — I4891 Unspecified atrial fibrillation: Secondary | ICD-10-CM | POA: Diagnosis not present

## 2016-04-06 LAB — POCT INR: INR: 4.4

## 2016-04-19 ENCOUNTER — Ambulatory Visit (INDEPENDENT_AMBULATORY_CARE_PROVIDER_SITE_OTHER): Payer: Medicare Other | Admitting: Pharmacist

## 2016-04-19 DIAGNOSIS — I4891 Unspecified atrial fibrillation: Secondary | ICD-10-CM

## 2016-04-19 DIAGNOSIS — Z7901 Long term (current) use of anticoagulants: Secondary | ICD-10-CM | POA: Diagnosis not present

## 2016-04-19 LAB — POCT INR: INR: 2.7

## 2016-04-29 ENCOUNTER — Other Ambulatory Visit: Payer: Self-pay | Admitting: Internal Medicine

## 2016-04-29 ENCOUNTER — Other Ambulatory Visit: Payer: Self-pay | Admitting: Family Medicine

## 2016-04-29 NOTE — Telephone Encounter (Signed)
Is this okay to refill? 

## 2016-05-12 ENCOUNTER — Ambulatory Visit (INDEPENDENT_AMBULATORY_CARE_PROVIDER_SITE_OTHER): Payer: Medicare Other | Admitting: *Deleted

## 2016-05-12 DIAGNOSIS — Z950 Presence of cardiac pacemaker: Secondary | ICD-10-CM | POA: Diagnosis not present

## 2016-05-12 DIAGNOSIS — I482 Chronic atrial fibrillation, unspecified: Secondary | ICD-10-CM

## 2016-05-12 NOTE — Progress Notes (Signed)
Remote pacemaker transmission.   

## 2016-05-13 ENCOUNTER — Encounter: Payer: Self-pay | Admitting: Cardiology

## 2016-05-17 LAB — CUP PACEART REMOTE DEVICE CHECK
Implantable Lead Implant Date: 20040429
Implantable Lead Location: 753860
MDC IDC SESS DTM: 20170905103302
Pulse Gen Model: 1110
Pulse Gen Serial Number: 2313421

## 2016-05-31 ENCOUNTER — Ambulatory Visit
Admission: RE | Admit: 2016-05-31 | Discharge: 2016-05-31 | Disposition: A | Discharge status: Home / Self Care | Payer: Medicare Other | Source: Ambulatory Visit | Attending: Family Medicine | Admitting: Family Medicine

## 2016-05-31 ENCOUNTER — Ambulatory Visit (INDEPENDENT_AMBULATORY_CARE_PROVIDER_SITE_OTHER): Payer: Medicare Other | Admitting: Family Medicine

## 2016-05-31 ENCOUNTER — Encounter: Payer: Self-pay | Admitting: Family Medicine

## 2016-05-31 VITALS — BP 156/90 | HR 80 | Ht 66.0 in

## 2016-05-31 DIAGNOSIS — R0789 Other chest pain: Secondary | ICD-10-CM | POA: Diagnosis not present

## 2016-05-31 DIAGNOSIS — Z7901 Long term (current) use of anticoagulants: Secondary | ICD-10-CM | POA: Diagnosis not present

## 2016-05-31 DIAGNOSIS — R918 Other nonspecific abnormal finding of lung field: Secondary | ICD-10-CM | POA: Diagnosis not present

## 2016-05-31 DIAGNOSIS — Z23 Encounter for immunization: Secondary | ICD-10-CM | POA: Diagnosis not present

## 2016-05-31 NOTE — Patient Instructions (Signed)
  Go to [place] for chest x-ray today. Get more tylenol to use as needed. Do NOT take any more aleve. Reschedule your protime check with the coumadin clinic where you normally get it checked.  We discussed ice x 24 hours then switching to heat for the painful areas of your chest and ribs. Seek immediate care if you develop shortness of breath, worsening pain or other new symptoms.

## 2016-05-31 NOTE — Progress Notes (Signed)
Chief Complaint  Patient presents with  . Fall    fell last night at [facility] checked out by EMS there. Is in a lot of pain right shoulder pain.    Tripped going down the stairs after working at the [employer] last night.  He fell down two stairs, landed on concrete floor, hit his right flank/shoulder blade.  No head injury or loss of consciousness.  Denies any lower extremity injuries. He scraped the right elbow, which bled just a little initially.  Denies any other bleeding, bruising, swelling.  He is on coumadin for atrial fibrillation.  Paramedics evaluated him at the scene--BP and "everything checked out".    He denies any dizziness, chest pain, palpitations, or other contributing factor to the fall, just that he tripped.  Slept in his chair last night, as he seems to feel okay sitting.  He was up and down fine earlier this morning, but had trouble getting out of the chair this afternoon. At first he stated that it may have hurt too much to push himself up (related to chest wall/shoulder pain), but then admitted that it is really his knee arthritis that has been bothering him. He is able to reposition himself and use his right arm without any pain during his visit.  He usually takes tylenol for pain, but ran out. Took one of his wife's Aleve today. (they are aware he shouldn't be taking this due to his coumadin use).  He gets his protime checked through the coumadin clinic, was due to today but had to cancel.  PMH, PSH, SH reviewed  Outpatient Encounter Prescriptions as of 05/31/2016  Medication Sig Note  . benazepril-hydrochlorthiazide (LOTENSIN HCT) 20-12.5 MG tablet Take 1 tablet by mouth  daily   . carvedilol (COREG) 3.125 MG tablet Take 1 tablet by mouth two  times daily   . doxazosin (CARDURA) 4 MG tablet Take 1 tablet by mouth at  bedtime   . latanoprost (XALATAN) 0.005 % ophthalmic solution Place 1 drop into both eyes at bedtime.     . Multiple Vitamin (MULTI VITAMIN MENS PO)  Take 1 capsule by mouth daily.    . Multiple Vitamins-Minerals (PRESERVISION AREDS 2 PO) Take 2 capsules by mouth daily. I-CAPS   . potassium chloride SA (K-DUR,KLOR-CON) 20 MEQ tablet Take one-half tablet by  mouth daily   . simvastatin (ZOCOR) 20 MG tablet Take 1 tablet by mouth at  bedtime   . Vitamins A & D (VITAMIN A & D) 5000-400 UNITS CAPS Take 1 capsule by mouth daily.    Marland Kitchen warfarin (COUMADIN) 5 MG tablet Take 1 tablet by mouth  every day or as directed 05/31/2016: Monday 5mg , all other days 2.5mg    Facility-Administered Encounter Medications as of 05/31/2016  Medication  . influenza  inactive virus vaccine (FLUZONE/FLUARIX) injection 0.5 mL   No Known Allergies  ROS: no fever, chills, URI symptoms, headaches, dizziness, chest pain, palpitations, cough, shortness of breath, leg swelling, nausea, vomiting, rash, bleeding.  +knee pain, +right chest wall pain since fall  PHYSICAL EXAM:  BP (!) 156/90 (BP Location: Left Arm, Patient Position: Sitting, Cuff Size: Normal)   Pulse 80   Ht 5\' 6"  (1.676 m) Comment: per patient  Nurse had to help him out of the car in the parking lot. He appears comfortable and in no distress at all sitting on the exam table. Head--atraumatic, normocephalic. Conjunctiva and sclera are clear Neck: no spinal tenderness or muscle spasm Back: no spinal tenderness or CVA tenderness  Chest wall: Mildly tender over the right lateral inferior ribs, and over the right anterior chest.  His area of discomfort extends posteriorly near the shoulder blade. There is a very small area of erythema on the right flank area, nontender to palpation.  There is no bony tenderness, stepoff, ecchymosis, swelling. He has normal ROM of the right shoulder and elbow.  There is a small skin tear at the right lateral elbow, with no active bleeding  Heart: fairly regular, no murmur Lungs: clear bilaterally Extremities: no edema. Knees without significant swelling/effusion, no  bruising. Psych: normal mood, affect, hygiene and grooming   Lab Results  Component Value Date   INR 2.7 04/19/2016   INR 4.4 04/06/2016   INR 2.9 02/24/2016   ASSESSMENT/PLAN:  Chest wall pain - suspect contusion related to fall. r/o fracture - Plan: DG Chest 2 View  Long term (current) use of anticoagulants - no evidence of any bleeding complication per exam. r/s f/u with coumadin clinic. Avoid NSAIDs. Use tylenol prn knee pain  Need for prophylactic vaccination and inoculation against influenza - Plan: Flu vaccine HIGH DOSE PF (Fluzone High dose)    Go to [place] for chest x-ray today. Get more tylenol to use as needed. Do NOT take any more aleve. Reschedule your protime check with the coumadin clinic where you normally get it checked.  We discussed ice x 24 hours then switching to heat for the painful areas of your chest and ribs. Seek immediate care if you develop shortness of breath, worsening pain or other new symptoms.

## 2016-06-02 ENCOUNTER — Telehealth: Payer: Self-pay | Admitting: Family Medicine

## 2016-06-02 NOTE — Telephone Encounter (Signed)
See Dr. Johnsie Kindred response, call in Ultram

## 2016-06-02 NOTE — Telephone Encounter (Signed)
Wife called back to f/u on pain stating that pt will need the med today. He is in pain that keep him up at night

## 2016-06-02 NOTE — Telephone Encounter (Signed)
Pt's wife called and stated that pt is in a lot of pain. She states that the tylenol is not working and they have been up for two nights. She is requesting something for him for pain. [facility] and can be reached at [phone].

## 2016-06-02 NOTE — Telephone Encounter (Signed)
Please call in tramadol 50mg , to be taking q8hr prn severe pain, #10, no refill--I recommend that he take this pain medication at bedtime, since his sleep is interrupted due to pain.  I would try and avoid using it during the day, unless pain is severe. Continue Tylenol Arthritis or Extra Strength arthritis.  Have him f/u with Dr. Redmond School next week if still in pain.  Please advise pt/wife

## 2016-06-02 NOTE — Telephone Encounter (Signed)
Called into [facility], had to leave on voice mail as pharmacist on break.  Called pt t/w wife [name] rx called in, she will pick up

## 2016-06-06 ENCOUNTER — Other Ambulatory Visit: Payer: Self-pay

## 2016-06-06 ENCOUNTER — Ambulatory Visit (INDEPENDENT_AMBULATORY_CARE_PROVIDER_SITE_OTHER): Payer: Medicare Other | Admitting: Family Medicine

## 2016-06-06 ENCOUNTER — Encounter: Payer: Self-pay | Admitting: Family Medicine

## 2016-06-06 VITALS — BP 110/60 | HR 86 | Wt 197.0 lb

## 2016-06-06 DIAGNOSIS — R0789 Other chest pain: Secondary | ICD-10-CM

## 2016-06-06 DIAGNOSIS — Z7901 Long term (current) use of anticoagulants: Secondary | ICD-10-CM | POA: Diagnosis not present

## 2016-06-06 MED ORDER — TRAMADOL HCL 50 MG PO TABS: ORAL_TABLET | ORAL | 0 refills | Status: DC

## 2016-06-06 NOTE — Telephone Encounter (Signed)
Called tramadol in per jcl 

## 2016-06-06 NOTE — Progress Notes (Signed)
   Subjective:    Patient ID: [name]., male    DOB: 1930-11-18, 82 y.o.   MRN: [id number]  HPI He is here for a recheck. He has been using tramadol and Tylenol for his pain. He does continue on his anticoagulation. He states that he is slowly getting better. His had no cough, congestion, fever or chills. No nausea or vomiting.   Review of Systems     Objective:   Physical Exam Alert and complaining of right flank pain. Exam of that area does show 3 linear diagonal abrasions. It is tender to palpation in that area. Lungs are clear to auscultation. Abdominal exam shows no masses or tenderness. X-rays were reviewed.       Assessment & Plan:  Chest wall pain - Plan: traMADol (ULTRAM) 50 MG tablet  Long term (current) use of anticoagulants He will continue on Tylenol and tramadol. He is slowly getting better. I explained that whether he was bruised or had fractured ribs, the therapy at this time would be exactly the same. He is comfortable with this.

## 2016-06-22 ENCOUNTER — Other Ambulatory Visit: Payer: Self-pay | Admitting: *Deleted

## 2016-06-22 ENCOUNTER — Other Ambulatory Visit: Payer: Self-pay | Admitting: Family Medicine

## 2016-06-22 ENCOUNTER — Telehealth: Payer: Self-pay | Admitting: Family Medicine

## 2016-06-22 ENCOUNTER — Other Ambulatory Visit: Payer: Self-pay

## 2016-06-22 ENCOUNTER — Ambulatory Visit (INDEPENDENT_AMBULATORY_CARE_PROVIDER_SITE_OTHER): Payer: Medicare Other | Admitting: Pharmacist Clinician (PhC)/ Clinical Pharmacy Specialist

## 2016-06-22 DIAGNOSIS — I4891 Unspecified atrial fibrillation: Secondary | ICD-10-CM | POA: Diagnosis not present

## 2016-06-22 DIAGNOSIS — Z7901 Long term (current) use of anticoagulants: Secondary | ICD-10-CM | POA: Diagnosis not present

## 2016-06-22 DIAGNOSIS — R0789 Other chest pain: Secondary | ICD-10-CM

## 2016-06-22 LAB — POCT INR: INR: 3.2

## 2016-06-22 MED ORDER — TRAMADOL HCL 50 MG PO TABS: ORAL_TABLET | ORAL | 0 refills | Status: DC

## 2016-06-22 MED ORDER — CARVEDILOL 3.125 MG PO TABS: 3.1250 mg | ORAL_TABLET | Freq: Two times a day (BID) | ORAL | 0 refills | Status: DC

## 2016-06-22 NOTE — Telephone Encounter (Signed)
Pt still having pain from fall, having trouble going to sleep & would like refill on tramadol to [facility]

## 2016-06-22 NOTE — Telephone Encounter (Signed)
ok 

## 2016-06-22 NOTE — Telephone Encounter (Signed)
done

## 2016-06-22 NOTE — Telephone Encounter (Signed)
Called in Tramadol per JCL  

## 2016-06-27 ENCOUNTER — Other Ambulatory Visit: Payer: Self-pay | Admitting: Internal Medicine

## 2016-07-05 DIAGNOSIS — H401111 Primary open-angle glaucoma, right eye, mild stage: Secondary | ICD-10-CM | POA: Diagnosis not present

## 2016-07-05 DIAGNOSIS — H2513 Age-related nuclear cataract, bilateral: Secondary | ICD-10-CM | POA: Diagnosis not present

## 2016-07-05 DIAGNOSIS — H5203 Hypermetropia, bilateral: Secondary | ICD-10-CM | POA: Diagnosis not present

## 2016-07-05 DIAGNOSIS — H401122 Primary open-angle glaucoma, left eye, moderate stage: Secondary | ICD-10-CM | POA: Diagnosis not present

## 2016-07-18 ENCOUNTER — Ambulatory Visit (INDEPENDENT_AMBULATORY_CARE_PROVIDER_SITE_OTHER): Payer: Medicare Other | Admitting: Pharmacist Clinician (PhC)/ Clinical Pharmacy Specialist

## 2016-07-18 DIAGNOSIS — Z7901 Long term (current) use of anticoagulants: Secondary | ICD-10-CM | POA: Diagnosis not present

## 2016-07-18 DIAGNOSIS — I4891 Unspecified atrial fibrillation: Secondary | ICD-10-CM | POA: Diagnosis not present

## 2016-07-18 LAB — POCT INR: INR: 3.2

## 2016-07-28 DIAGNOSIS — H25811 Combined forms of age-related cataract, right eye: Secondary | ICD-10-CM | POA: Diagnosis not present

## 2016-07-28 DIAGNOSIS — H2511 Age-related nuclear cataract, right eye: Secondary | ICD-10-CM | POA: Diagnosis not present

## 2016-08-02 ENCOUNTER — Ambulatory Visit (INDEPENDENT_AMBULATORY_CARE_PROVIDER_SITE_OTHER): Payer: Medicare Other | Admitting: Internal Medicine

## 2016-08-02 ENCOUNTER — Encounter (INDEPENDENT_AMBULATORY_CARE_PROVIDER_SITE_OTHER): Payer: Self-pay

## 2016-08-02 ENCOUNTER — Encounter: Payer: Self-pay | Admitting: Internal Medicine

## 2016-08-02 ENCOUNTER — Ambulatory Visit (INDEPENDENT_AMBULATORY_CARE_PROVIDER_SITE_OTHER): Payer: Medicare Other | Admitting: *Deleted

## 2016-08-02 VITALS — BP 138/88 | HR 81 | Ht 66.0 in | Wt 195.4 lb

## 2016-08-02 DIAGNOSIS — I4891 Unspecified atrial fibrillation: Secondary | ICD-10-CM

## 2016-08-02 DIAGNOSIS — Z950 Presence of cardiac pacemaker: Secondary | ICD-10-CM

## 2016-08-02 DIAGNOSIS — Z7901 Long term (current) use of anticoagulants: Secondary | ICD-10-CM | POA: Diagnosis not present

## 2016-08-02 LAB — POCT INR: INR: 2.6

## 2016-08-02 NOTE — Progress Notes (Signed)
HPI [name] returns today for followup. He is a 82 year old man with hypertension, atrial fibrillation, syncope, and bradycardia, status post permanent pacemaker insertion. In the interim, he has done well except for some arthritis. He notes he fell but did not break anything. He denies chest pain. He has dyspnea with exertion.  No peripheral edema.  He denies syncope.   Allergies  Allergen Reactions  . Tramadol Itching     Current Outpatient Prescriptions  Medication Sig Dispense Refill  . benazepril-hydrochlorthiazide (LOTENSIN HCT) 20-12.5 MG tablet TAKE 1 TABLET BY MOUTH  DAILY 90 tablet 1  . carvedilol (COREG) 3.125 MG tablet Take 1 tablet (3.125 mg total) by mouth 2 (two) times daily. 60 tablet 0  . Difluprednate (DUREZOL OP) Apply to eye as directed.    . doxazosin (CARDURA) 4 MG tablet TAKE 1 TABLET BY MOUTH AT  BEDTIME 90 tablet 0  . latanoprost (XALATAN) 0.005 % ophthalmic solution Place 1 drop into both eyes at bedtime.      . moxifloxacin (VIGAMOX) 0.5 % ophthalmic solution Apply to eye as directed.    . Multiple Vitamin (MULTI VITAMIN MENS PO) Take 1 capsule by mouth daily.     . Multiple Vitamins-Minerals (PRESERVISION AREDS 2 PO) Take 2 capsules by mouth daily. I-CAPS    . potassium chloride SA (K-DUR,KLOR-CON) 20 MEQ tablet Take one-half tablet by  mouth daily 45 tablet 5  . simvastatin (ZOCOR) 20 MG tablet Take 1 tablet by mouth at  bedtime 90 tablet 3  . Vitamins A & D (VITAMIN A & D) 5000-400 UNITS CAPS Take 1 capsule by mouth daily.     Marland Kitchen warfarin (COUMADIN) 5 MG tablet Take 1 tablet by mouth  every day or as directed 90 tablet 1   No current facility-administered medications for this visit.    Facility-Administered Medications Ordered in Other Visits  Medication Dose Route Frequency Provider Last Rate Last Dose  . influenza  inactive virus vaccine (FLUZONE/FLUARIX) injection 0.5 mL  0.5 mL Intramuscular Once Mark Ohara, MD         Past Medical History:   Diagnosis Date  . A-fib ([place])   . CAD (coronary artery disease)    GXT, neg bruce protocol GXT  . Chronic kidney disease    RENAL INSUFFICIENCY  . Diverticulosis   . ED (erectile dysfunction)   . GERD (gastroesophageal reflux disease)   . Glaucoma   . Gout   . Heart disorder   . Hypertension   . Obesity     ROS:   All systems reviewed and negative except as noted in the HPI.   Past Surgical History:  Procedure Laterality Date  . PACEMAKER INSERTION  01/09/03     Family History  Problem Relation Age of Onset  . Heart disease Mother   . Hypertension Mother   . Kidney disease Mother   . Stroke Mother   . Heart disease Father      Social History   Social History  . Marital status: Married    Spouse name: N/A  . Number of children: 2  . Years of education: S-College   Occupational History  .      Works part time   Social History Main Topics  . Smoking status: Former Smoker    Quit date: 09/13/1979  . Smokeless tobacco: Never Used  . Alcohol use Yes     Comment: Consumes 2-3 glasses of wine per week  . Drug use: No  . Sexual activity:  Not on file   Other Topics Concern  . Not on file   Social History Narrative  . No narrative on file     BP 138/88   Pulse 81   Ht 5\' 6"  (1.676 m)   Wt 195 lb 6.4 oz (88.6 kg)   BMI 31.54 kg/m   Physical Exam:  Well appearing 82 year old man, NAD HEENT: Unremarkable Neck:  7 cm JVD, no thyromegally Lungs:  Clear with no wheezes, rales, or rhonchi. HEART:  IRegular rate rhythm, no murmurs, no rubs, no clicks Abd:  soft, positive bowel sounds, no organomegally, no rebound, no guarding Ext:  2 plus pulses, no edema, no cyanosis, no clubbing Skin:  No rashes no nodules Neuro:  CN II through XII intact, motor grossly intact  DEVICE  Normal device function.  See PaceArt for details.   Assess/Plan:  1. Atrial fib - his rate is well controlled. He will continue his current meds. 2. HTN - his blood pressure has  improved. Will follow. 3. PPM - his St. Jude single chamber device is working normally. Will recheck in several months.  [name].D.

## 2016-08-02 NOTE — Patient Instructions (Signed)
Medication Instructions:  Your physician recommends that you continue on your current medications as directed. Please refer to the Current Medication list given to you today.   Labwork: None Ordered   Testing/Procedures: None Ordered    Follow-Up: Your physician wants you to follow-up in: 1 year with Dr. Taylor. You will receive a reminder letter in the mail two months in advance. If you don't receive a letter, please call our office to schedule the follow-up appointment.  Remote monitoring is used to monitor your Pacemaker from home. This monitoring reduces the number of office visits required to check your device to one time per year. It allows us to keep an eye on the functioning of your device to ensure it is working properly. You are scheduled for a device check from home on 11/01/16. You may send your transmission at any time that day. If you have a wireless device, the transmission will be sent automatically. After your physician reviews your transmission, you will receive a postcard with your next transmission date.     Any Other Special Instructions Will Be Listed Below (If Applicable).     If you need a refill on your cardiac medications before your next appointment, please call your pharmacy.   

## 2016-08-23 ENCOUNTER — Ambulatory Visit (INDEPENDENT_AMBULATORY_CARE_PROVIDER_SITE_OTHER): Payer: Medicare Other | Admitting: Pharmacist Clinician (PhC)/ Clinical Pharmacy Specialist

## 2016-08-23 DIAGNOSIS — Z7901 Long term (current) use of anticoagulants: Secondary | ICD-10-CM | POA: Diagnosis not present

## 2016-08-23 DIAGNOSIS — I4891 Unspecified atrial fibrillation: Secondary | ICD-10-CM | POA: Diagnosis not present

## 2016-08-23 LAB — POCT INR: INR: 2.3

## 2016-08-29 ENCOUNTER — Other Ambulatory Visit: Payer: Self-pay | Admitting: *Deleted

## 2016-08-29 MED ORDER — CARVEDILOL 3.125 MG PO TABS: 3.1250 mg | ORAL_TABLET | Freq: Two times a day (BID) | ORAL | 3 refills | Status: DC

## 2016-09-12 HISTORY — PX: CATARACT EXTRACTION, BILATERAL: SHX1313

## 2016-09-15 DIAGNOSIS — H2511 Age-related nuclear cataract, right eye: Secondary | ICD-10-CM | POA: Diagnosis not present

## 2016-09-15 DIAGNOSIS — H25812 Combined forms of age-related cataract, left eye: Secondary | ICD-10-CM | POA: Diagnosis not present

## 2016-09-15 DIAGNOSIS — H2512 Age-related nuclear cataract, left eye: Secondary | ICD-10-CM | POA: Diagnosis not present

## 2016-09-15 LAB — CUP PACEART INCLINIC DEVICE CHECK
Battery Voltage: 2.96 V
Brady Statistic RV Percent Paced: 13 %
Implantable Lead Location: 753860
Lead Channel Setting Pacing Pulse Width: 0.8 ms
Lead Channel Setting Sensing Sensitivity: 2 mV
MDC IDC LEAD IMPLANT DT: 20040429
MDC IDC MSMT LEADCHNL RV IMPEDANCE VALUE: 437.5 Ohm
MDC IDC MSMT LEADCHNL RV PACING THRESHOLD AMPLITUDE: 1.75 V
MDC IDC MSMT LEADCHNL RV PACING THRESHOLD PULSEWIDTH: 0.8 ms
MDC IDC MSMT LEADCHNL RV SENSING INTR AMPL: 4.9 mV
MDC IDC PG IMPLANT DT: 20110325
MDC IDC PG SERIAL: 2313421
MDC IDC SESS DTM: 20171121183429
MDC IDC SET LEADCHNL RV PACING AMPLITUDE: 4 V
Pulse Gen Model: 1110

## 2016-09-19 ENCOUNTER — Ambulatory Visit (INDEPENDENT_AMBULATORY_CARE_PROVIDER_SITE_OTHER): Payer: Medicare Other | Admitting: Pharmacist Clinician (PhC)/ Clinical Pharmacy Specialist

## 2016-09-19 DIAGNOSIS — I4891 Unspecified atrial fibrillation: Secondary | ICD-10-CM

## 2016-09-19 DIAGNOSIS — Z7901 Long term (current) use of anticoagulants: Secondary | ICD-10-CM

## 2016-09-19 LAB — POCT INR: INR: 1.9

## 2016-10-15 ENCOUNTER — Other Ambulatory Visit: Payer: Self-pay | Admitting: Internal Medicine

## 2016-10-15 ENCOUNTER — Other Ambulatory Visit: Payer: Self-pay | Admitting: Family Medicine

## 2016-10-17 ENCOUNTER — Ambulatory Visit (INDEPENDENT_AMBULATORY_CARE_PROVIDER_SITE_OTHER): Payer: Medicare Other | Admitting: Pharmacist

## 2016-10-17 DIAGNOSIS — I4891 Unspecified atrial fibrillation: Secondary | ICD-10-CM | POA: Diagnosis not present

## 2016-10-17 DIAGNOSIS — Z7901 Long term (current) use of anticoagulants: Secondary | ICD-10-CM

## 2016-10-17 LAB — POCT INR: INR: 2.4

## 2016-11-01 ENCOUNTER — Encounter: Payer: Medicare Other | Admitting: *Deleted

## 2016-11-01 ENCOUNTER — Telehealth: Payer: Self-pay | Admitting: Cardiology

## 2016-11-01 NOTE — Telephone Encounter (Signed)
LMOVM reminding pt to send remote transmission.   

## 2016-11-04 ENCOUNTER — Encounter: Payer: Self-pay | Admitting: Cardiology

## 2016-11-09 ENCOUNTER — Ambulatory Visit (INDEPENDENT_AMBULATORY_CARE_PROVIDER_SITE_OTHER): Payer: Medicare Other | Admitting: *Deleted

## 2016-11-09 DIAGNOSIS — I4891 Unspecified atrial fibrillation: Secondary | ICD-10-CM | POA: Diagnosis not present

## 2016-11-14 NOTE — Progress Notes (Signed)
Remote pacemaker transmission.   

## 2016-11-15 ENCOUNTER — Encounter: Payer: Self-pay | Admitting: Cardiology

## 2016-11-15 LAB — CUP PACEART REMOTE DEVICE CHECK
Battery Remaining Percentage: 95.5 %
Battery Voltage: 2.96 V
Date Time Interrogation Session: 20180301021242
Lead Channel Setting Pacing Pulse Width: 0.8 ms
MDC IDC LEAD IMPLANT DT: 20040429
MDC IDC LEAD LOCATION: 753860
MDC IDC MSMT BATTERY REMAINING LONGEVITY: 95 mo
MDC IDC MSMT LEADCHNL RV IMPEDANCE VALUE: 440 Ohm
MDC IDC MSMT LEADCHNL RV PACING THRESHOLD AMPLITUDE: 1.75 V
MDC IDC MSMT LEADCHNL RV PACING THRESHOLD PULSEWIDTH: 0.8 ms
MDC IDC MSMT LEADCHNL RV SENSING INTR AMPL: 3.9 mV
MDC IDC PG IMPLANT DT: 20110325
MDC IDC PG SERIAL: 2313421
MDC IDC SET LEADCHNL RV PACING AMPLITUDE: 4 V
MDC IDC SET LEADCHNL RV SENSING SENSITIVITY: 2 mV
MDC IDC STAT BRADY RV PERCENT PACED: 12 %
Pulse Gen Model: 1110

## 2016-11-21 ENCOUNTER — Ambulatory Visit (INDEPENDENT_AMBULATORY_CARE_PROVIDER_SITE_OTHER): Payer: Medicare Other | Admitting: Pharmacist Clinician (PhC)/ Clinical Pharmacy Specialist

## 2016-11-21 DIAGNOSIS — Z7901 Long term (current) use of anticoagulants: Secondary | ICD-10-CM | POA: Diagnosis not present

## 2016-11-21 DIAGNOSIS — I4891 Unspecified atrial fibrillation: Secondary | ICD-10-CM | POA: Diagnosis not present

## 2016-11-21 LAB — POCT INR: INR: 2.5

## 2016-12-12 ENCOUNTER — Encounter: Payer: Self-pay | Admitting: Family Medicine

## 2016-12-12 ENCOUNTER — Ambulatory Visit
Admission: RE | Admit: 2016-12-12 | Discharge: 2016-12-12 | Disposition: A | Discharge status: Home / Self Care | Payer: Medicare Other | Source: Ambulatory Visit | Attending: Family Medicine | Admitting: Family Medicine

## 2016-12-12 ENCOUNTER — Ambulatory Visit (INDEPENDENT_AMBULATORY_CARE_PROVIDER_SITE_OTHER): Payer: Medicare Other | Admitting: Family Medicine

## 2016-12-12 VITALS — BP 124/84 | HR 75 | Wt 198.6 lb

## 2016-12-12 DIAGNOSIS — I7090 Unspecified atherosclerosis: Secondary | ICD-10-CM | POA: Diagnosis not present

## 2016-12-12 DIAGNOSIS — M79671 Pain in right foot: Secondary | ICD-10-CM

## 2016-12-12 DIAGNOSIS — M7989 Other specified soft tissue disorders: Secondary | ICD-10-CM | POA: Diagnosis not present

## 2016-12-12 DIAGNOSIS — M1990 Unspecified osteoarthritis, unspecified site: Secondary | ICD-10-CM

## 2016-12-12 DIAGNOSIS — M79672 Pain in left foot: Principal | ICD-10-CM

## 2016-12-12 DIAGNOSIS — R209 Unspecified disturbances of skin sensation: Secondary | ICD-10-CM

## 2016-12-12 DIAGNOSIS — M25571 Pain in right ankle and joints of right foot: Secondary | ICD-10-CM | POA: Diagnosis not present

## 2016-12-12 NOTE — Progress Notes (Signed)
   Subjective:    Patient ID: Mark Hooker., male    DOB: 08-01-1931, 83 y.o.   MRN: [id number]  HPI He complains of a two-week history of pain in both ankles with walking. Pain usually goes away relatively quickly after he stops. He has not noted any calf or thigh discomfort. As have an underlying history of arthritis and apparently does need a knee replacement but is not quite ready for that.   Review of Systems     Objective:   Physical Exam Alert and in no distress. The right foot was slightly cyanotic and slightly cool. Pulses were difficult to feel in both feet. Pain on motion of the ankle. No palpable tenderness noted. Normal sensation.       Assessment & Plan:  Foot pain, bilateral - Plan: DG Ankle Complete Left, DG Ankle Complete Right  Cold right foot - Plan: VAS [place] LE ART SEG MULTI (Segm&LE Reynauds)  Arthritis - Plan: DG Ankle Complete Left, DG Ankle Complete Right The x-rays show some arterial calcification of the joints appear fairly normal. Will wait for the vascular studies.

## 2016-12-15 ENCOUNTER — Ambulatory Visit (HOSPITAL_COMMUNITY)
Admission: RE | Admit: 2016-12-15 | Discharge: 2016-12-15 | Disposition: A | Discharge status: Home / Self Care | Payer: Medicare Other | Source: Ambulatory Visit | Attending: Cardiology | Admitting: Cardiology

## 2016-12-15 DIAGNOSIS — M79604 Pain in right leg: Secondary | ICD-10-CM | POA: Diagnosis not present

## 2016-12-15 DIAGNOSIS — R230 Cyanosis: Secondary | ICD-10-CM | POA: Diagnosis not present

## 2016-12-15 DIAGNOSIS — I2510 Atherosclerotic heart disease of native coronary artery without angina pectoris: Secondary | ICD-10-CM | POA: Insufficient documentation

## 2016-12-15 DIAGNOSIS — R0989 Other specified symptoms and signs involving the circulatory and respiratory systems: Secondary | ICD-10-CM | POA: Diagnosis not present

## 2016-12-15 DIAGNOSIS — I10 Essential (primary) hypertension: Secondary | ICD-10-CM | POA: Diagnosis not present

## 2016-12-15 DIAGNOSIS — Z87891 Personal history of nicotine dependence: Secondary | ICD-10-CM | POA: Diagnosis not present

## 2016-12-15 DIAGNOSIS — M79605 Pain in left leg: Secondary | ICD-10-CM | POA: Insufficient documentation

## 2016-12-15 DIAGNOSIS — R209 Unspecified disturbances of skin sensation: Secondary | ICD-10-CM | POA: Diagnosis not present

## 2016-12-19 ENCOUNTER — Ambulatory Visit (INDEPENDENT_AMBULATORY_CARE_PROVIDER_SITE_OTHER): Payer: Medicare Other | Admitting: Family Medicine

## 2016-12-19 DIAGNOSIS — M79672 Pain in left foot: Secondary | ICD-10-CM

## 2016-12-19 DIAGNOSIS — M79671 Pain in right foot: Secondary | ICD-10-CM | POA: Diagnosis not present

## 2016-12-19 NOTE — Progress Notes (Signed)
   Subjective:    Patient ID: [name]., male    DOB: 10-Jun-1931, 83 y.o.   MRN: [id number]  HPI He is here for a follow-up visit. Since last being seen he did have x-rays as well as vascular studies. The vascular studies were essentially negative. The x-rays did show some soft tissue swelling. He is presently on Coumadin. He does state that he takes Tylenol very sparingly and does get some relief of his pain from that. He does have a previous history of gout but says this pain is nowhere near the discomfort he had with gout.   Review of Systems     Objective:   Physical Exam Alert and in no distress. Slight edema is noted in the talar area bilaterally. Full motion of the ankle without pain. Is not warm or tender.      Assessment & Plan:  Foot pain, bilateral  I discussed treatment of this and since he is on Coumadin, does limit the medications we can use. Since he is tolerating Tylenol at a relatively low dose, we will continue with this. If he has further difficulty may consider referring him to Dr. Doran Durand possible joint injections.

## 2016-12-19 NOTE — Patient Instructions (Signed)
You can take up to 2 Tylenol 4 times per day as needed for your pain

## 2017-01-02 ENCOUNTER — Ambulatory Visit (INDEPENDENT_AMBULATORY_CARE_PROVIDER_SITE_OTHER): Payer: Medicare Other | Admitting: Pharmacist Clinician (PhC)/ Clinical Pharmacy Specialist

## 2017-01-02 DIAGNOSIS — I4891 Unspecified atrial fibrillation: Secondary | ICD-10-CM | POA: Diagnosis not present

## 2017-01-02 DIAGNOSIS — Z7901 Long term (current) use of anticoagulants: Secondary | ICD-10-CM

## 2017-01-02 LAB — POCT INR: INR: 2.3

## 2017-01-10 ENCOUNTER — Other Ambulatory Visit: Payer: Self-pay | Admitting: Family Medicine

## 2017-01-12 ENCOUNTER — Ambulatory Visit (INDEPENDENT_AMBULATORY_CARE_PROVIDER_SITE_OTHER): Payer: Medicare Other | Admitting: Family Medicine

## 2017-01-12 ENCOUNTER — Encounter: Payer: Self-pay | Admitting: Family Medicine

## 2017-01-12 VITALS — BP 124/86 | HR 76 | Ht 66.0 in | Wt 197.0 lb

## 2017-01-12 DIAGNOSIS — R269 Unspecified abnormalities of gait and mobility: Secondary | ICD-10-CM

## 2017-01-12 DIAGNOSIS — N183 Chronic kidney disease, stage 3 unspecified: Secondary | ICD-10-CM

## 2017-01-12 DIAGNOSIS — Z950 Presence of cardiac pacemaker: Secondary | ICD-10-CM

## 2017-01-12 DIAGNOSIS — I482 Chronic atrial fibrillation, unspecified: Secondary | ICD-10-CM

## 2017-01-12 DIAGNOSIS — I10 Essential (primary) hypertension: Secondary | ICD-10-CM

## 2017-01-12 DIAGNOSIS — E785 Hyperlipidemia, unspecified: Secondary | ICD-10-CM

## 2017-01-12 DIAGNOSIS — Z7901 Long term (current) use of anticoagulants: Secondary | ICD-10-CM

## 2017-01-12 DIAGNOSIS — I7090 Unspecified atherosclerosis: Secondary | ICD-10-CM

## 2017-01-12 DIAGNOSIS — H409 Unspecified glaucoma: Secondary | ICD-10-CM

## 2017-01-12 DIAGNOSIS — Z0000 Encounter for general adult medical examination without abnormal findings: Secondary | ICD-10-CM

## 2017-01-12 DIAGNOSIS — M1990 Unspecified osteoarthritis, unspecified site: Secondary | ICD-10-CM

## 2017-01-12 LAB — LIPID PANEL
CHOL/HDL RATIO: 2.7 ratio (ref <5.0)
CHOLESTEROL: 95 mg/dL (ref <200)
HDL: 35 mg/dL — ABNORMAL LOW (ref >40)
LDL CALC: 48 mg/dL (ref <100)
Triglycerides: 62 mg/dL (ref <150)
VLDL: 12 mg/dL (ref <30)

## 2017-01-12 LAB — COMPREHENSIVE METABOLIC PANEL
ALT: 11 U/L (ref 9–46)
AST: 18 U/L (ref 10–35)
Albumin: 3.8 g/dL (ref 3.6–5.1)
Alkaline Phosphatase: 66 U/L (ref 40–115)
BUN: 24 mg/dL (ref 7–25)
CHLORIDE: 109 mmol/L (ref 98–110)
CO2: 25 mmol/L (ref 20–31)
Calcium: 9.3 mg/dL (ref 8.6–10.3)
Creat: 1.45 mg/dL — ABNORMAL HIGH (ref 0.70–1.11)
GLUCOSE: 89 mg/dL (ref 65–99)
POTASSIUM: 4.7 mmol/L (ref 3.5–5.3)
Sodium: 142 mmol/L (ref 135–146)
Total Bilirubin: 0.8 mg/dL (ref 0.2–1.2)
Total Protein: 6.5 g/dL (ref 6.1–8.1)

## 2017-01-12 LAB — CBC WITH DIFFERENTIAL/PLATELET
BASOS ABS: 51 {cells}/uL (ref 0–200)
Basophils Relative: 1 %
EOS ABS: 510 {cells}/uL — AB (ref 15–500)
Eosinophils Relative: 10 %
HCT: 41.2 % (ref 38.5–50.0)
Hemoglobin: 13.2 g/dL (ref 13.2–17.1)
LYMPHS PCT: 20 %
Lymphs Abs: 1020 cells/uL (ref 850–3900)
MCH: 29.7 pg (ref 27.0–33.0)
MCHC: 32 g/dL (ref 32.0–36.0)
MCV: 92.6 fL (ref 80.0–100.0)
MONOS PCT: 9 %
MPV: 9.7 fL (ref 7.5–12.5)
Monocytes Absolute: 459 cells/uL (ref 200–950)
Neutro Abs: 3060 cells/uL (ref 1500–7800)
Neutrophils Relative %: 60 %
PLATELETS: 137 10*3/uL — AB (ref 140–400)
RBC: 4.45 MIL/uL (ref 4.20–5.80)
RDW: 15.3 % — ABNORMAL HIGH (ref 11.0–15.0)
WBC: 5.1 10*3/uL (ref 4.0–10.5)

## 2017-01-12 MED ORDER — SIMVASTATIN 20 MG PO TABS: 20.0000 mg | ORAL_TABLET | Freq: Every day | ORAL | 3 refills | Status: DC

## 2017-01-12 NOTE — Progress Notes (Addendum)
Subjective:   HPI  [name]. is a 83 y.o. male who presents for Chief Complaint  Patient presents with  . Medicare Wellness  . Annual Exam    Medical care team includes: Wyatt Haste, MD here for primary care Dr.Taylor  Dr Alvan Dame Preventative care:[place] Last ophthalmology visit:10/2016 Last dental visit:been over a year Last colonoscopy:11/18/08 Last prostate exam: here Last EKG:07/31/15 Last labs:06/16/15  Prior vaccinations:  TD or Tdap:09/10/08 Influenza: 05/31/16 Pneumococcal: 23:01/03/98 06/11/08 13: 06/25/15 Shingles/Zostavax:Prescription written to get a drugstore  Advanced directive: Has information at home  Concerns: He continues to be followed by cardiology for his underlying atrial fifth as well as having a pacemaker. He has had no difficulty with chest pain, shortness of breath, PND or DOE. He continues on simvastatin and is having no muscle aches or pains. He also taking Coumadin and current plans to continue this. Apparently his PT/INRs have been quite adequate. He has had difficulty with knee pain and is at least considering having a replacement done sometime after his granddaughter graduates from high school. The knee pain definitely makes him walk with an abnormal gait. He has a history of glaucoma and is on medication for that. He has had cataracts removed from both eyes and is seeing much better now. He continues on simvastatin with no difficulties. Review of record indicates history of atherosclerosis Reviewed their medical, surgical, family, social, medication, and allergy history and updated chart as appropriate.  Past Medical History:  Diagnosis Date  . A-fib ([place])   . CAD (coronary artery disease)    GXT, neg bruce protocol GXT  . Chronic kidney disease    RENAL INSUFFICIENCY  . Diverticulosis   . ED (erectile dysfunction)   . GERD (gastroesophageal reflux disease)   . Glaucoma   . Gout   . Heart disorder   . Hypertension   . Obesity      Past Surgical History:  Procedure Laterality Date  . PACEMAKER INSERTION  01/09/03    Social History   Social History  . Marital status: Married    Spouse name: N/A  . Number of children: 2  . Years of education: S-College   Occupational History  .      Works part time   Social History Main Topics  . Smoking status: Former Smoker    Quit date: 09/13/1979  . Smokeless tobacco: Never Used  . Alcohol use Yes     Comment: Consumes 2-3 glasses of wine per week  . Drug use: No  . Sexual activity: Not on file   Other Topics Concern  . Not on file   Social History Narrative  . No narrative on file    Family History  Problem Relation Age of Onset  . Heart disease Mother   . Hypertension Mother   . Kidney disease Mother   . Stroke Mother   . Heart disease Father      Current Outpatient Prescriptions:  .  benazepril-hydrochlorthiazide (LOTENSIN HCT) 20-12.5 MG tablet, TAKE 1 TABLET BY MOUTH  DAILY, Disp: 90 tablet, Rfl: 3 .  carvedilol (COREG) 3.125 MG tablet, Take 1 tablet (3.125 mg total) by mouth 2 (two) times daily., Disp: 180 tablet, Rfl: 3 .  doxazosin (CARDURA) 4 MG tablet, TAKE 1 TABLET BY MOUTH AT  BEDTIME, Disp: 90 tablet, Rfl: 2 .  Multiple Vitamin (MULTI VITAMIN MENS PO), Take 1 capsule by mouth daily. , Disp: , Rfl:  .  Multiple Vitamins-Minerals (PRESERVISION AREDS 2 PO), Take  2 capsules by mouth daily. I-CAPS, Disp: , Rfl:  .  potassium chloride SA (K-DUR,KLOR-CON) 20 MEQ tablet, Take one-half tablet by  mouth daily, Disp: 45 tablet, Rfl: 5 .  simvastatin (ZOCOR) 20 MG tablet, Take 1 tablet (20 mg total) by mouth at bedtime., Disp: 90 tablet, Rfl: 3 .  Vitamins A & D (VITAMIN A & D) 5000-400 UNITS CAPS, Take 1 capsule by mouth daily. , Disp: , Rfl:  .  warfarin (COUMADIN) 5 MG tablet, Take 1 tablet by mouth  every day or as directed, Disp: 90 tablet, Rfl: 1 No current facility-administered medications for this visit.   Facility-Administered Medications  Ordered in Other Visits:  .  influenza  inactive virus vaccine (FLUZONE/FLUARIX) injection 0.5 mL, 0.5 mL, Intramuscular, Once, Rita Ohara, MD  Allergies  Allergen Reactions  . Tramadol Itching    Review of Systems  negative except as above    Objective:   General appearance: alert, no distress, WD/WN, Caucasian male Skin:  Benign moles noted. HEENT: normocephalic, conjunctiva/corneas normal, sclerae anicteric, PERRLA, EOMi, nares patent, no discharge or erythema, pharynx normal Oral cavity: MMM, tongue normal, teeth normal Neck: supple, no lymphadenopathy, no thyromegaly, no masses, normal ROM, no bruits Chest: non tender, normal shape and expansion Heart: RRR, normal S1, S2, no murmurs Lungs: CTA bilaterally, no wheezes, rhonchi, or rales Abdomen: +bs, soft, non tender, non distended, no masses, no hepatomegaly, no splenomegaly, no bruits Back: non tender, normal ROM, no scoliosis Musculoskeletal: upper extremities non tender, no obvious deformity, normal ROM throughout, lower extremities non tender, no obvious deformity, normal ROM throughout Extremities: no edema, no cyanosis, no clubbing Pulses: 2+ symmetric, upper and lower extremities, normal cap refill Neurological: alert, oriented x 3, CN2-12 intact, strength normal upper extremities and lower extremities, sensation normal throughout, DTRs 2+ throughout, no cerebellar signs, gait normal Psychiatric: normal affect, behavior normal, pleasant    Assessment and Plan :    Chronic atrial fibrillation (HCC) - Plan: CBC with Differential/Platelet, Comprehensive metabolic panel  PACEMAKER-[facility]  Hyperlipidemia LDL goal <70 - Plan: Lipid panel, simvastatin (ZOCOR) 20 MG tablet  Essential hypertension - Plan: CBC with Differential/Platelet, Comprehensive metabolic panel  Long term (current) use of anticoagulants  Glaucoma of both eyes, unspecified glaucoma type  Arthritis  Atherosclerosis - Plan: CBC with  Differential/Platelet, Comprehensive metabolic panel, Lipid panel  Abnormality of gait  overall he is doing quite well. Encouraged him to definitely go ahead and get the knee surgery explained that he will probably not realize how much better he would feel especially with physical activity. Encouraged him to bring his advanced directive and so we can put it in the record.  Physical exam - discussed and counseled on healthy lifestyle, diet, exercise, preventative care, vaccinations, sick and well care, Follow-up pending labs, yearly for physical 01/13/17 blood work reported showing evidence of CK D stage III.

## 2017-01-13 DIAGNOSIS — N183 Chronic kidney disease, stage 3 unspecified: Secondary | ICD-10-CM | POA: Insufficient documentation

## 2017-01-13 DIAGNOSIS — N1831 Chronic kidney disease, stage 3a: Secondary | ICD-10-CM | POA: Insufficient documentation

## 2017-01-30 ENCOUNTER — Ambulatory Visit (INDEPENDENT_AMBULATORY_CARE_PROVIDER_SITE_OTHER): Payer: Medicare Other | Admitting: Pharmacist

## 2017-01-30 DIAGNOSIS — Z7901 Long term (current) use of anticoagulants: Secondary | ICD-10-CM

## 2017-01-30 DIAGNOSIS — I4891 Unspecified atrial fibrillation: Secondary | ICD-10-CM

## 2017-01-30 LAB — POCT INR: INR: 2.4

## 2017-02-13 ENCOUNTER — Ambulatory Visit (INDEPENDENT_AMBULATORY_CARE_PROVIDER_SITE_OTHER): Payer: Medicare Other | Admitting: *Deleted

## 2017-02-13 ENCOUNTER — Telehealth: Payer: Self-pay | Admitting: Cardiology

## 2017-02-13 DIAGNOSIS — I4891 Unspecified atrial fibrillation: Secondary | ICD-10-CM

## 2017-02-13 NOTE — Telephone Encounter (Signed)
Spoke with pt and reminded pt of remote transmission that is due today. Pt verbalized understanding.   

## 2017-02-13 NOTE — Progress Notes (Signed)
Remote pacemaker transmission.   

## 2017-02-15 LAB — CUP PACEART REMOTE DEVICE CHECK
Battery Remaining Longevity: 89 mo
Battery Remaining Percentage: 91 %
Battery Voltage: 2.95 V
Date Time Interrogation Session: 20180604154054
Implantable Lead Location: 753860
Implantable Pulse Generator Implant Date: 20110325
Lead Channel Pacing Threshold Amplitude: 1.75 V
Lead Channel Pacing Threshold Pulse Width: 0.8 ms
Lead Channel Setting Pacing Pulse Width: 0.8 ms
Lead Channel Setting Sensing Sensitivity: 2 mV
MDC IDC LEAD IMPLANT DT: 20040429
MDC IDC MSMT LEADCHNL RV IMPEDANCE VALUE: 440 Ohm
MDC IDC MSMT LEADCHNL RV SENSING INTR AMPL: 3.4 mV
MDC IDC PG SERIAL: 2313421
MDC IDC SET LEADCHNL RV PACING AMPLITUDE: 4 V
MDC IDC STAT BRADY RV PERCENT PACED: 12 %
Pulse Gen Model: 1110

## 2017-02-21 ENCOUNTER — Encounter: Payer: Self-pay | Admitting: Cardiology

## 2017-03-13 ENCOUNTER — Ambulatory Visit (INDEPENDENT_AMBULATORY_CARE_PROVIDER_SITE_OTHER): Payer: Medicare Other | Admitting: Pharmacist

## 2017-03-13 DIAGNOSIS — Z7901 Long term (current) use of anticoagulants: Secondary | ICD-10-CM | POA: Diagnosis not present

## 2017-03-13 DIAGNOSIS — I4891 Unspecified atrial fibrillation: Secondary | ICD-10-CM

## 2017-03-13 LAB — POCT INR: INR: 2

## 2017-03-14 DIAGNOSIS — H26493 Other secondary cataract, bilateral: Secondary | ICD-10-CM | POA: Diagnosis not present

## 2017-03-14 DIAGNOSIS — H401131 Primary open-angle glaucoma, bilateral, mild stage: Secondary | ICD-10-CM | POA: Diagnosis not present

## 2017-03-31 ENCOUNTER — Other Ambulatory Visit: Payer: Self-pay | Admitting: Family Medicine

## 2017-04-24 ENCOUNTER — Ambulatory Visit (INDEPENDENT_AMBULATORY_CARE_PROVIDER_SITE_OTHER): Payer: Medicare Other | Admitting: Pharmacist Clinician (PhC)/ Clinical Pharmacy Specialist

## 2017-04-24 DIAGNOSIS — Z7901 Long term (current) use of anticoagulants: Secondary | ICD-10-CM

## 2017-04-24 DIAGNOSIS — I4891 Unspecified atrial fibrillation: Secondary | ICD-10-CM

## 2017-04-24 LAB — POCT INR: INR: 2

## 2017-05-06 ENCOUNTER — Other Ambulatory Visit: Payer: Self-pay | Admitting: Internal Medicine

## 2017-05-16 ENCOUNTER — Encounter: Payer: Medicare Other | Admitting: *Deleted

## 2017-05-16 ENCOUNTER — Ambulatory Visit (INDEPENDENT_AMBULATORY_CARE_PROVIDER_SITE_OTHER): Payer: Medicare Other | Admitting: Family Medicine

## 2017-05-16 VITALS — BP 130/90 | HR 75 | Temp 98.1°F | Resp 16 | Wt 193.2 lb

## 2017-05-16 DIAGNOSIS — W540XXA Bitten by dog, initial encounter: Secondary | ICD-10-CM | POA: Diagnosis not present

## 2017-05-16 DIAGNOSIS — S61452A Open bite of left hand, initial encounter: Secondary | ICD-10-CM | POA: Diagnosis not present

## 2017-05-16 DIAGNOSIS — Z23 Encounter for immunization: Secondary | ICD-10-CM | POA: Diagnosis not present

## 2017-05-16 NOTE — Progress Notes (Signed)
   Subjective:    Patient ID: Mark Hooker., male    DOB: [birth year], 83 y.o.   MRN: [id number]  HPI he was trying to break up to dogs fighting and  sustained a small abrasion to the dorsal surface of the thumb over the MCP joint and also the dorsolateral area of the second MCP joint. He does not complain of tenderness warmth or difficulty moving the fingers. Apparently the dog is up-to-date on shots. His last tetanus was in 2009.  Review of Systems     Objective:   Physical Exam Swelling is noted over the dorsum of the hand but it is not hot red or tender. Full motion of the fingers. Full motion of the thumb and fingers without difficulty. Slight tenderness to palpation over the carpal metacarpal joint.      Assessment & Plan:  Dog bite of left hand, initial encounter  Need for influenza vaccination - Plan: Flu vaccine HIGH DOSE PF (Fluzone High dose) Discussed treatment of this with him and if the swelling, heat or tenderness gets worse, he will call. Also recommend he go to the drugstore to get a TDaP

## 2017-06-05 ENCOUNTER — Ambulatory Visit (INDEPENDENT_AMBULATORY_CARE_PROVIDER_SITE_OTHER): Payer: Medicare Other | Admitting: Pharmacist Clinician (PhC)/ Clinical Pharmacy Specialist

## 2017-06-05 DIAGNOSIS — Z7901 Long term (current) use of anticoagulants: Secondary | ICD-10-CM | POA: Diagnosis not present

## 2017-06-05 DIAGNOSIS — I4891 Unspecified atrial fibrillation: Secondary | ICD-10-CM | POA: Diagnosis not present

## 2017-06-05 LAB — POCT INR: INR: 1.9

## 2017-06-12 ENCOUNTER — Other Ambulatory Visit: Payer: Self-pay | Admitting: Family Medicine

## 2017-07-19 ENCOUNTER — Other Ambulatory Visit: Payer: Self-pay | Admitting: Cardiovascular Disease

## 2017-07-19 ENCOUNTER — Ambulatory Visit (INDEPENDENT_AMBULATORY_CARE_PROVIDER_SITE_OTHER): Payer: Medicare Other | Admitting: Pharmacist Clinician (PhC)/ Clinical Pharmacy Specialist

## 2017-07-19 DIAGNOSIS — Z7901 Long term (current) use of anticoagulants: Secondary | ICD-10-CM

## 2017-07-19 DIAGNOSIS — I4891 Unspecified atrial fibrillation: Secondary | ICD-10-CM | POA: Diagnosis not present

## 2017-07-19 LAB — POCT INR: INR: 3.1

## 2017-07-31 DIAGNOSIS — I2510 Atherosclerotic heart disease of native coronary artery without angina pectoris: Secondary | ICD-10-CM | POA: Insufficient documentation

## 2017-07-31 DIAGNOSIS — I4891 Unspecified atrial fibrillation: Secondary | ICD-10-CM | POA: Insufficient documentation

## 2017-07-31 DIAGNOSIS — I4821 Permanent atrial fibrillation: Secondary | ICD-10-CM | POA: Insufficient documentation

## 2017-07-31 DIAGNOSIS — K5790 Diverticulosis of intestine, part unspecified, without perforation or abscess without bleeding: Secondary | ICD-10-CM | POA: Insufficient documentation

## 2017-07-31 DIAGNOSIS — E669 Obesity, unspecified: Secondary | ICD-10-CM | POA: Insufficient documentation

## 2017-07-31 DIAGNOSIS — K219 Gastro-esophageal reflux disease without esophagitis: Secondary | ICD-10-CM | POA: Insufficient documentation

## 2017-07-31 DIAGNOSIS — N529 Male erectile dysfunction, unspecified: Secondary | ICD-10-CM | POA: Insufficient documentation

## 2017-08-09 DIAGNOSIS — M25562 Pain in left knee: Secondary | ICD-10-CM | POA: Diagnosis not present

## 2017-08-09 DIAGNOSIS — G8929 Other chronic pain: Secondary | ICD-10-CM | POA: Diagnosis not present

## 2017-08-09 DIAGNOSIS — M25561 Pain in right knee: Secondary | ICD-10-CM | POA: Diagnosis not present

## 2017-08-09 DIAGNOSIS — M170 Bilateral primary osteoarthritis of knee: Secondary | ICD-10-CM | POA: Diagnosis not present

## 2017-08-11 ENCOUNTER — Encounter: Payer: Self-pay | Admitting: Internal Medicine

## 2017-08-11 ENCOUNTER — Ambulatory Visit: Payer: Medicare Other | Admitting: Internal Medicine

## 2017-08-11 VITALS — BP 132/86 | HR 77 | Ht 66.0 in | Wt 191.2 lb

## 2017-08-11 DIAGNOSIS — R55 Syncope and collapse: Secondary | ICD-10-CM

## 2017-08-11 DIAGNOSIS — Z950 Presence of cardiac pacemaker: Secondary | ICD-10-CM | POA: Diagnosis not present

## 2017-08-11 DIAGNOSIS — I4891 Unspecified atrial fibrillation: Secondary | ICD-10-CM | POA: Diagnosis not present

## 2017-08-11 LAB — CUP PACEART INCLINIC DEVICE CHECK
Brady Statistic RV Percent Paced: 12 %
Implantable Lead Implant Date: 20040429
Implantable Lead Location: 753860
Implantable Pulse Generator Implant Date: 20110325
Lead Channel Sensing Intrinsic Amplitude: 4.3 mV
MDC IDC MSMT LEADCHNL RV PACING THRESHOLD AMPLITUDE: 1.25 V
MDC IDC MSMT LEADCHNL RV PACING THRESHOLD PULSEWIDTH: 0.8 ms
MDC IDC SESS DTM: 20181130165642
Pulse Gen Model: 1110
Pulse Gen Serial Number: 2313421

## 2017-08-11 NOTE — Patient Instructions (Signed)

## 2017-08-11 NOTE — Progress Notes (Signed)
HPI Mr. Skoda returns today for ongoing evaluation and management of atrial fibrillation, hypertension, and intermittent complete heart block status post permanent pacemaker insertion. In the interim, he has been stable. He denies chest pain or shortness of breath. He has very minimal dyspnea but notes that he has become more sedentary and has of arthritis in his knees. He is considering knee replacement surgery. Allergies  Allergen Reactions  . Tramadol Itching     Current Outpatient Medications  Medication Sig Dispense Refill  . benazepril-hydrochlorthiazide (LOTENSIN HCT) 20-12.5 MG tablet TAKE 1 TABLET BY MOUTH  DAILY 90 tablet 3  . carvedilol (COREG) 3.125 MG tablet TAKE 1 TABLET BY MOUTH TWO  TIMES DAILY 180 tablet 0  . doxazosin (CARDURA) 4 MG tablet TAKE 1 TABLET BY MOUTH AT  BEDTIME 90 tablet 0  . latanoprost (XALATAN) 0.005 % ophthalmic solution Place 1 drop into both eyes daily.  3  . Multiple Vitamin (MULTI VITAMIN MENS PO) Take 1 capsule by mouth daily.     . Multiple Vitamins-Minerals (PRESERVISION AREDS 2 PO) Take 2 capsules by mouth daily. I-CAPS    . potassium chloride SA (K-DUR,KLOR-CON) 20 MEQ tablet TAKE ONE-HALF TABLET BY  MOUTH DAILY 45 tablet 1  . simvastatin (ZOCOR) 20 MG tablet Take 1 tablet (20 mg total) by mouth at bedtime. 90 tablet 3  . Vitamins A & D (VITAMIN A & D) 5000-400 UNITS CAPS Take 1 capsule by mouth daily.     Marland Kitchen warfarin (COUMADIN) 5 MG tablet Take 1/2 tablet daily or as directed by coumadin clinic 45 tablet 1   No current facility-administered medications for this visit.    Facility-Administered Medications Ordered in Other Visits  Medication Dose Route Frequency Provider Last Rate Last Dose  . influenza  inactive virus vaccine (FLUZONE/FLUARIX) injection 0.5 mL  0.5 mL Intramuscular Once Rita Ohara, MD         Past Medical History:  Diagnosis Date  . A-fib ([place])   . CAD (coronary artery disease)    GXT, neg bruce protocol GXT  .  Chronic kidney disease    RENAL INSUFFICIENCY  . Diverticulosis   . ED (erectile dysfunction)   . GERD (gastroesophageal reflux disease)   . Glaucoma   . Gout   . Heart disorder   . Hypertension   . Obesity     ROS:   All systems reviewed and negative except as noted in the HPI.   Past Surgical History:  Procedure Laterality Date  . PACEMAKER INSERTION  01/09/03     Family History  Problem Relation Age of Onset  . Heart disease Mother   . Hypertension Mother   . Kidney disease Mother   . Stroke Mother   . Heart disease Father      Social History   Socioeconomic History  . Marital status: Married    Spouse name: Not on file  . Number of children: 2  . Years of education: S-College  . Highest education level: Not on file  Social Needs  . Financial resource strain: Not on file  . Food insecurity - worry: Not on file  . Food insecurity - inability: Not on file  . Transportation needs - medical: Not on file  . Transportation needs - non-medical: Not on file  Occupational History    Comment: Works part time  Tobacco Use  . Smoking status: Former Smoker    Last attempt to quit: 09/13/1979    Years since quitting:  37.9  . Smokeless tobacco: Never Used  Substance and Sexual Activity  . Alcohol use: Yes    Comment: Consumes 2-3 glasses of wine per week  . Drug use: No  . Sexual activity: Not on file  Other Topics Concern  . Not on file  Social History Narrative  . Not on file     BP 132/86   Pulse 77   Ht 5\' 6"  (1.676 m)   Wt 191 lb 3.2 oz (86.7 kg)   SpO2 98%   BMI 30.86 kg/m   Physical Exam:  Well appearing 83 year old man, NAD HEENT: Unremarkable Neck:  7 cm JVD, no thyromegally Lymphatics:  No adenopathy Back:  No CVA tenderness Lungs:  Clear, with no wheezes, rales, or rhonchi HEART:  IRegular rate rhythm, no murmurs, no rubs, no clicks Abd:  soft, positive bowel sounds, no organomegally, no rebound, no guarding Ext:  2 plus pulses, trace  peripheral edema, no cyanosis, no clubbing Skin:  No rashes no nodules Neuro:  CN II through XII intact, motor grossly intact  EKG - atrial fibrillation with a controlled ventricular response  DEVICE  Normal device function.  See PaceArt for details.   Assess/Plan: 1. Chronic atrial fibrillation - his ventricular rate is well controlled. He will continue his current medical therapy. 2. Hypertension - his blood pressure is stable and well controlled. No change in medical therapy. 3. Pacemaker - his Willow single-chamber pacemaker is working normally. He has over 5 years of battery longevity. We'll recheck in several months.  [name], M.D.

## 2017-08-30 ENCOUNTER — Ambulatory Visit (INDEPENDENT_AMBULATORY_CARE_PROVIDER_SITE_OTHER): Payer: Medicare Other | Admitting: Pharmacist

## 2017-08-30 DIAGNOSIS — Z7901 Long term (current) use of anticoagulants: Secondary | ICD-10-CM | POA: Diagnosis not present

## 2017-08-30 DIAGNOSIS — I4891 Unspecified atrial fibrillation: Secondary | ICD-10-CM | POA: Diagnosis not present

## 2017-08-30 LAB — POCT INR: INR: 4.1

## 2017-09-06 ENCOUNTER — Other Ambulatory Visit: Payer: Self-pay | Admitting: Internal Medicine

## 2017-09-13 ENCOUNTER — Ambulatory Visit (INDEPENDENT_AMBULATORY_CARE_PROVIDER_SITE_OTHER): Payer: Medicare Other | Admitting: Pharmacist

## 2017-09-13 DIAGNOSIS — Z7901 Long term (current) use of anticoagulants: Secondary | ICD-10-CM

## 2017-09-13 DIAGNOSIS — I4891 Unspecified atrial fibrillation: Secondary | ICD-10-CM | POA: Diagnosis not present

## 2017-09-13 LAB — POCT INR: INR: 3.2

## 2017-10-04 ENCOUNTER — Ambulatory Visit (INDEPENDENT_AMBULATORY_CARE_PROVIDER_SITE_OTHER): Payer: Medicare Other | Admitting: Pharmacist

## 2017-10-04 DIAGNOSIS — Z7901 Long term (current) use of anticoagulants: Secondary | ICD-10-CM | POA: Diagnosis not present

## 2017-10-04 DIAGNOSIS — I4891 Unspecified atrial fibrillation: Secondary | ICD-10-CM

## 2017-10-04 LAB — POCT INR: INR: 2.7

## 2017-11-02 ENCOUNTER — Telehealth: Payer: Self-pay | Admitting: Internal Medicine

## 2017-11-02 NOTE — Telephone Encounter (Signed)
°  1. Has your device fired? no  2. Is you device beeping? no  3. Are you experiencing draining or swelling at device site? no  4. Are you calling to see if we received your device transmission? no  5. Have you passed out? No  Patient moved and does not have the same landline anymore    Please route to [place]

## 2017-11-02 NOTE — Telephone Encounter (Signed)
Spoke with patient who reports that he does not have a wireless adapter for his home monitor. I confirmed that I would mail him a new adapter. He states that he has moved. His new address has been updated in demographics.

## 2017-11-03 ENCOUNTER — Telehealth: Payer: Self-pay

## 2017-11-05 ENCOUNTER — Other Ambulatory Visit: Payer: Self-pay | Admitting: Family Medicine

## 2017-11-06 NOTE — Telephone Encounter (Signed)
Erroneous encounter

## 2017-11-08 ENCOUNTER — Ambulatory Visit (INDEPENDENT_AMBULATORY_CARE_PROVIDER_SITE_OTHER): Payer: Medicare Other | Admitting: Pharmacist

## 2017-11-08 DIAGNOSIS — I4891 Unspecified atrial fibrillation: Secondary | ICD-10-CM

## 2017-11-08 DIAGNOSIS — Z7901 Long term (current) use of anticoagulants: Secondary | ICD-10-CM

## 2017-11-08 LAB — POCT INR: INR: 1.8

## 2017-11-13 ENCOUNTER — Ambulatory Visit (INDEPENDENT_AMBULATORY_CARE_PROVIDER_SITE_OTHER): Payer: Medicare Other | Admitting: *Deleted

## 2017-11-13 DIAGNOSIS — I4891 Unspecified atrial fibrillation: Secondary | ICD-10-CM

## 2017-11-14 LAB — CUP PACEART REMOTE DEVICE CHECK
Battery Remaining Percentage: 95.5 %
Brady Statistic RV Percent Paced: 11 %
Date Time Interrogation Session: 20190304193255
Implantable Lead Implant Date: 20040429
Lead Channel Pacing Threshold Amplitude: 1.25 V
Lead Channel Setting Pacing Amplitude: 2.5 V
Lead Channel Setting Sensing Sensitivity: 2 mV
MDC IDC LEAD LOCATION: 753860
MDC IDC MSMT BATTERY REMAINING LONGEVITY: 128 mo
MDC IDC MSMT BATTERY VOLTAGE: 2.96 V
MDC IDC MSMT LEADCHNL RV IMPEDANCE VALUE: 440 Ohm
MDC IDC MSMT LEADCHNL RV PACING THRESHOLD PULSEWIDTH: 0.8 ms
MDC IDC MSMT LEADCHNL RV SENSING INTR AMPL: 5.8 mV
MDC IDC PG IMPLANT DT: 20110325
MDC IDC PG SERIAL: 2313421
MDC IDC SET LEADCHNL RV PACING PULSEWIDTH: 0.8 ms
Pulse Gen Model: 1110

## 2017-11-14 NOTE — Progress Notes (Signed)
Remote pacemaker transmission.   

## 2017-11-15 ENCOUNTER — Encounter: Payer: Self-pay | Admitting: Cardiology

## 2017-12-19 ENCOUNTER — Ambulatory Visit (INDEPENDENT_AMBULATORY_CARE_PROVIDER_SITE_OTHER): Payer: Medicare Other | Admitting: Pharmacist Clinician (PhC)/ Clinical Pharmacy Specialist

## 2017-12-19 DIAGNOSIS — Z7901 Long term (current) use of anticoagulants: Secondary | ICD-10-CM

## 2017-12-19 DIAGNOSIS — I4891 Unspecified atrial fibrillation: Secondary | ICD-10-CM | POA: Diagnosis not present

## 2017-12-19 LAB — POCT INR: INR: 2.5

## 2017-12-21 ENCOUNTER — Other Ambulatory Visit: Payer: Self-pay | Admitting: Family Medicine

## 2018-01-09 ENCOUNTER — Telehealth: Payer: Self-pay | Admitting: Internal Medicine

## 2018-01-09 NOTE — Telephone Encounter (Signed)
   [facility] Pre-operative Risk Assessment    Request for surgical clearance:  1. What type of surgery is being performed?  Right knee TKA medial and lateral w/wo patella resurfacing   2. When is this surgery scheduled?  02/27/18   3. What type of clearance is required (medical clearance vs. Pharmacy clearance to hold med vs. Both)?  Both  4. Are there any medications that need to be held prior to surgery and how long? Warfarin   5. Practice name and name of physician performing surgery?  Dr. Alvan Dame with [facility]   6. What is your office phone number [phone]    7.   What is your office fax number? [employer]  8.   Anesthesia type (None, local, MAC, general) ? Spinal   [name] L 01/09/2018, 3:18 PM  _________________________________________________________________   (provider comments below)

## 2018-01-09 NOTE — Telephone Encounter (Signed)
Message routed to pharmacy for coumadin guidance.

## 2018-01-09 NOTE — Telephone Encounter (Signed)
Pt takes warfarin for afib with CHADS2VASc score of 4 (age x2, HTN, CAD). Ok to hold warfarin for 5 days prior to procedure. 

## 2018-01-10 NOTE — Telephone Encounter (Signed)
   Primary Cardiologist: [name], MD  Chart reviewed as part of pre-operative protocol coverage. Given past medical history and time since last visit, based on ACC/AHA guidelines, [name]. would be at acceptable risk for the planned procedure without further cardiovascular testing. He is easily getting > 4 Mets of activity despite Knee issue.   I will route this recommendation to the requesting party via Epic fax function and remove from pre-op pool.  Please call with questions.  [place], [place] 01/10/2018, 1:53 PM

## 2018-01-19 ENCOUNTER — Other Ambulatory Visit: Payer: Self-pay | Admitting: Family Medicine

## 2018-01-26 ENCOUNTER — Ambulatory Visit (INDEPENDENT_AMBULATORY_CARE_PROVIDER_SITE_OTHER): Payer: Medicare Other | Admitting: Family Medicine

## 2018-01-26 ENCOUNTER — Encounter: Payer: Self-pay | Admitting: Family Medicine

## 2018-01-26 VITALS — BP 108/68 | HR 73 | Temp 97.8°F | Ht 64.0 in | Wt 184.6 lb

## 2018-01-26 DIAGNOSIS — E785 Hyperlipidemia, unspecified: Secondary | ICD-10-CM | POA: Diagnosis not present

## 2018-01-26 DIAGNOSIS — Z7901 Long term (current) use of anticoagulants: Secondary | ICD-10-CM | POA: Diagnosis not present

## 2018-01-26 DIAGNOSIS — I10 Essential (primary) hypertension: Secondary | ICD-10-CM

## 2018-01-26 DIAGNOSIS — I482 Chronic atrial fibrillation, unspecified: Secondary | ICD-10-CM

## 2018-01-26 DIAGNOSIS — Z01818 Encounter for other preprocedural examination: Secondary | ICD-10-CM

## 2018-01-26 DIAGNOSIS — Z950 Presence of cardiac pacemaker: Secondary | ICD-10-CM | POA: Diagnosis not present

## 2018-01-26 DIAGNOSIS — N183 Chronic kidney disease, stage 3 unspecified: Secondary | ICD-10-CM

## 2018-01-26 DIAGNOSIS — M1990 Unspecified osteoarthritis, unspecified site: Secondary | ICD-10-CM

## 2018-01-26 NOTE — Progress Notes (Signed)
   Subjective:    Patient ID: Mark Hooker., male    DOB: 01/21/1931, 84 y.o.   MRN: [id number]  HPI He is here for preoperative evaluation.  Review of the record indicates that he was signed off on by cardiology.  He was told to hold his Coumadin for 5 days as well as multivitamins.  His medications were reviewed and he is taking them appropriately.  He has had some weight loss recently but states that he is eating well with no nausea, vomiting, abdominal pain, change in bowel habits.  He does not complain of early satiety.  He is now living with his daughter as his wife died in 07-Dec-2022.  He did sell his house.   Review of Systems     Objective:   Physical Exam Alert and in no distress. Tympanic membranes and canals are normal. Pharyngeal area is normal. Neck is supple without adenopathy or thyromegaly. Cardiac exam shows an irregular  rhythm without murmurs or gallops. Lungs are clear to auscultation.        Assessment & Plan:  Preoperative evaluation to rule out surgical contraindication  Essential hypertension - Plan: CBC with Differential/Platelet, Comprehensive metabolic panel  Long term (current) use of anticoagulants  Arthritis  Chronic atrial fibrillation (HCC) - Plan: CBC with Differential/Platelet, Comprehensive metabolic panel, Lipid panel  CKD (chronic kidney disease) stage 3, GFR 30-59 ml/min (HCC) - Plan: Comprehensive metabolic panel  Hyperlipidemia LDL goal <70 - Plan: Lipid panel  PACEMAKER-[facility] I will sign off on the paperwork when the blood work comes back

## 2018-01-27 LAB — CBC WITH DIFFERENTIAL/PLATELET
BASOS ABS: 0 10*3/uL (ref 0.0–0.2)
Basos: 1 %
EOS (ABSOLUTE): 0.5 10*3/uL — AB (ref 0.0–0.4)
Eos: 9 %
Hematocrit: 44.4 % (ref 37.5–51.0)
Hemoglobin: 15.1 g/dL (ref 13.0–17.7)
Immature Grans (Abs): 0 10*3/uL (ref 0.0–0.1)
Immature Granulocytes: 0 %
LYMPHS: 20 %
Lymphocytes Absolute: 1.1 10*3/uL (ref 0.7–3.1)
MCH: 31.1 pg (ref 26.6–33.0)
MCHC: 34 g/dL (ref 31.5–35.7)
MCV: 91 fL (ref 79–97)
MONOS ABS: 0.5 10*3/uL (ref 0.1–0.9)
Monocytes: 10 %
NEUTROS ABS: 3.3 10*3/uL (ref 1.4–7.0)
Neutrophils: 60 %
PLATELETS: 146 10*3/uL — AB (ref 150–379)
RBC: 4.86 x10E6/uL (ref 4.14–5.80)
RDW: 14.7 % (ref 12.3–15.4)
WBC: 5.5 10*3/uL (ref 3.4–10.8)

## 2018-01-27 LAB — LIPID PANEL
CHOLESTEROL TOTAL: 126 mg/dL (ref 100–199)
Chol/HDL Ratio: 3.3 ratio (ref 0.0–5.0)
HDL: 38 mg/dL — AB (ref >39)
LDL Calculated: 71 mg/dL (ref 0–99)
Triglycerides: 84 mg/dL (ref 0–149)
VLDL CHOLESTEROL CAL: 17 mg/dL (ref 5–40)

## 2018-01-27 LAB — COMPREHENSIVE METABOLIC PANEL
ALK PHOS: 65 IU/L (ref 39–117)
ALT: 15 IU/L (ref 0–44)
AST: 22 IU/L (ref 0–40)
Albumin/Globulin Ratio: 1.7 (ref 1.2–2.2)
Albumin: 4.1 g/dL (ref 3.5–4.7)
BILIRUBIN TOTAL: 0.7 mg/dL (ref 0.0–1.2)
BUN/Creatinine Ratio: 16 (ref 10–24)
BUN: 24 mg/dL (ref 8–27)
CHLORIDE: 102 mmol/L (ref 96–106)
CO2: 24 mmol/L (ref 20–29)
Calcium: 10.2 mg/dL (ref 8.6–10.2)
Creatinine, Ser: 1.52 mg/dL — ABNORMAL HIGH (ref 0.76–1.27)
GFR calc Af Amer: 48 mL/min/{1.73_m2} — ABNORMAL LOW (ref >59)
GFR calc non Af Amer: 41 mL/min/{1.73_m2} — ABNORMAL LOW (ref >59)
GLUCOSE: 70 mg/dL (ref 65–99)
Globulin, Total: 2.4 g/dL (ref 1.5–4.5)
POTASSIUM: 4.6 mmol/L (ref 3.5–5.2)
Sodium: 143 mmol/L (ref 134–144)
Total Protein: 6.5 g/dL (ref 6.0–8.5)

## 2018-01-27 LAB — MAGNESIUM: Magnesium: 1.8 mg/dL (ref 1.6–2.3)

## 2018-01-28 ENCOUNTER — Other Ambulatory Visit: Payer: Self-pay | Admitting: Cardiovascular Disease

## 2018-01-29 ENCOUNTER — Ambulatory Visit (INDEPENDENT_AMBULATORY_CARE_PROVIDER_SITE_OTHER): Payer: Medicare Other | Admitting: Pharmacist Clinician (PhC)/ Clinical Pharmacy Specialist

## 2018-01-29 DIAGNOSIS — Z7901 Long term (current) use of anticoagulants: Secondary | ICD-10-CM

## 2018-01-29 DIAGNOSIS — I482 Chronic atrial fibrillation, unspecified: Secondary | ICD-10-CM

## 2018-01-29 LAB — POCT INR: INR: 2.8

## 2018-02-07 NOTE — H&P (Signed)
TOTAL KNEE ADMISSION H&P  Patient is being admitted for right total knee arthroplasty.  Subjective:   Chief Complaint:    Right knee primary OA / pain  HPI: Mark Hooker., 84 y.o. male, has a history of pain and functional disability in the right knee due to arthritis and has failed non-surgical conservative treatments for greater than 12 weeks to include NSAID's and/or analgesics, corticosteriod injections, use of assistive devices and activity modification.  Onset of symptoms was gradual, starting 5-6 years ago with gradually worsening course since that time. The patient noted no past surgery on the right knee(s).  Patient currently rates pain in the right knee at 8 out of 10 with activity. Patient has worsening of pain with activity and weight bearing, pain that interferes with activities of daily living, pain with passive range of motion, crepitus and joint swelling.  Patient has evidence of periarticular osteophytes and joint space narrowing by imaging studies. There is no active infection.  Risks, benefits and expectations were discussed with the patient.  Risks including but not limited to the risk of anesthesia, blood clots, nerve damage, blood vessel damage, failure of the prosthesis, infection and up to and including death.  Patient understand the risks, benefits and expectations and wishes to proceed with surgery.   PCP: [name], MD  D/C Plans:       Home   Post-op Meds:       No Rx given  Tranexamic Acid:      To be given - IV   Decadron:      Is to be given  FYI:     Coumadin with Lovenox bridge  Norco  DME:   Pt already has equipment  PT:   OPPT Rx given   Patient Active Problem List   Diagnosis Date Noted  . Obesity   . GERD (gastroesophageal reflux disease)   . ED (erectile dysfunction)   . Diverticulosis   . CAD (coronary artery disease)   . A-fib ([place])   . CKD (chronic kidney disease) stage 3, GFR 30-59 ml/min (HCC) 01/13/2017  . Atherosclerosis  12/12/2016  . Arthritis 12/01/2014  . Abnormality of gait 02/05/2013  . Long term (current) use of anticoagulants 12/29/2012  . Glaucoma 07/26/2011  . Hyperlipidemia LDL goal <70 07/26/2011  . Hypertension 07/26/2011  . BRADYCARDIA 12/26/2008  . PACEMAKER-[facility]ude 12/26/2008   Past Medical History:  Diagnosis Date  . A-fib ([place])   . CAD (coronary artery disease)    GXT, neg bruce protocol GXT  . Chronic kidney disease    RENAL INSUFFICIENCY  . Diverticulosis   . ED (erectile dysfunction)   . GERD (gastroesophageal reflux disease)   . Glaucoma   . Gout   . Heart disorder   . Hypertension   . Obesity     Past Surgical History:  Procedure Laterality Date  . PACEMAKER INSERTION  01/09/03    No current facility-administered medications for this encounter.    Current Outpatient Medications  Medication Sig Dispense Refill Last Dose  . benazepril-hydrochlorthiazide (LOTENSIN HCT) 20-12.5 MG tablet TAKE 1 TABLET BY MOUTH  DAILY 90 tablet 3 Taking  . carvedilol (COREG) 3.125 MG tablet TAKE 1 TABLET BY MOUTH TWO  TIMES DAILY 180 tablet 3 Taking  . doxazosin (CARDURA) 4 MG tablet TAKE 1 TABLET BY MOUTH AT  BEDTIME 90 tablet 3 Taking  . latanoprost (XALATAN) 0.005 % ophthalmic solution Place 1 drop into both eyes daily.  3 Taking  . Multiple  Vitamin (MULTI VITAMIN MENS PO) Take 1 capsule by mouth daily.    Not Taking  . Multiple Vitamins-Minerals (PRESERVISION AREDS 2 PO) Take 2 capsules by mouth daily. I-CAPS   Taking  . potassium chloride SA (K-DUR,KLOR-CON) 20 MEQ tablet TAKE ONE-HALF TABLET BY  MOUTH DAILY 45 tablet 0 Taking  . simvastatin (ZOCOR) 20 MG tablet Take 1 tablet (20 mg total) by mouth at bedtime. 90 tablet 3 Taking  . Vitamins A & D (VITAMIN A & D) 5000-400 UNITS CAPS Take 1 capsule by mouth daily.    Taking  . warfarin (COUMADIN) 5 MG tablet TAKE 1/2 TABLET DAILY OR AS DIRECTED BY COUMADIN CLINIC 45 tablet 1    Facility-Administered Medications Ordered in Other  Encounters  Medication Dose Route Frequency Provider Last Rate Last Dose  . influenza  inactive virus vaccine (FLUZONE/FLUARIX) injection 0.5 mL  0.5 mL Intramuscular Once Rita Ohara, MD       Allergies  Allergen Reactions  . Tramadol Itching    Social History   Tobacco Use  . Smoking status: Former Smoker    Last attempt to quit: 09/13/1979    Years since quitting: 38.4  . Smokeless tobacco: Never Used  Substance Use Topics  . Alcohol use: Yes    Comment: Consumes 2-3 glasses of wine per week    Family History  Problem Relation Age of Onset  . Heart disease Mother   . Hypertension Mother   . Kidney disease Mother   . Stroke Mother   . Heart disease Father      Review of Systems  Constitutional: Negative.   HENT: Negative.   Eyes: Negative.   Respiratory: Negative.   Cardiovascular: Negative.   Gastrointestinal: Positive for heartburn.  Genitourinary: Negative.   Musculoskeletal: Positive for joint pain.  Skin: Negative.   Neurological: Negative.   Endo/Heme/Allergies: Negative.   Psychiatric/Behavioral: Negative.     Objective:  Physical Exam  Constitutional: He is oriented to person, place, and time. He appears well-developed.  HENT:  Head: Normocephalic.  Eyes: Pupils are equal, round, and reactive to light.  Neck: Neck supple. No JVD present. No tracheal deviation present. No thyromegaly present.  Cardiovascular: Normal rate, regular rhythm and intact distal pulses.  Respiratory: Effort normal and breath sounds normal. No respiratory distress. He has no wheezes.  GI: Soft. There is no tenderness. There is no guarding.  Musculoskeletal:       Right knee: He exhibits decreased range of motion, swelling and bony tenderness. He exhibits no ecchymosis, no deformity, no laceration and no erythema. Tenderness found.  Lymphadenopathy:    He has no cervical adenopathy.  Neurological: He is alert and oriented to person, place, and time.  Skin: Skin is warm and dry.   Psychiatric: He has a normal mood and affect.      Labs:  Estimated body mass index is 31.69 kg/m as calculated from the following:   Height as of 01/26/18: 5\' 4"  (1.626 m).   Weight as of 01/26/18: 83.7 kg (184 lb 9.6 oz).   Imaging Review Plain radiographs demonstrate severe degenerative joint disease of the right knee(s).  The bone quality appears to be good for age and reported activity level.   Preoperative templating of the joint replacement has been completed, documented, and submitted to the Operating Room personnel in order to optimize intra-operative equipment management.   Anticipated LOS equal to or greater than 2 midnights due to - Age 82 and older with one or more of  the following:  - Obesity  - Expected need for hospital services (PT, OT, Nursing) required for safe  discharge  - Anticipated need for postoperative skilled nursing care or inpatient rehab  - Active co-morbidities: Coronary Artery Disease and CKD and pacemaker     Assessment/Plan:  End stage arthritis, right knee   The patient history, physical examination, clinical judgment of the provider and imaging studies are consistent with end stage degenerative joint disease of the right knee and total knee arthroplasty is deemed medically necessary. The treatment options including medical management, injection therapy arthroscopy and arthroplasty were discussed at length. The risks and benefits of total knee arthroplasty were presented and reviewed. The risks due to aseptic loosening, infection, stiffness, patella tracking problems, thromboembolic complications and other imponderables were discussed. The patient acknowledged the explanation, agreed to proceed with the plan and consent was signed. Patient is being admitted for inpatient treatment for surgery, pain control, PT, OT, prophylactic antibiotics, VTE prophylaxis, progressive ambulation and ADL's and discharge planning. The patient is planning to be  discharged home.    [place] Jillianne Gamino   PA-C  02/07/2018, 5:05 PM

## 2018-02-12 ENCOUNTER — Telehealth: Payer: Self-pay | Admitting: Cardiology

## 2018-02-12 ENCOUNTER — Ambulatory Visit (INDEPENDENT_AMBULATORY_CARE_PROVIDER_SITE_OTHER): Payer: Medicare Other | Admitting: *Deleted

## 2018-02-12 DIAGNOSIS — R55 Syncope and collapse: Secondary | ICD-10-CM | POA: Diagnosis not present

## 2018-02-12 DIAGNOSIS — I482 Chronic atrial fibrillation, unspecified: Secondary | ICD-10-CM

## 2018-02-12 NOTE — Telephone Encounter (Signed)
Spoke with pt and reminded pt of remote transmission that is due today. Pt verbalized understanding.   

## 2018-02-13 NOTE — Progress Notes (Signed)
Remote pacemaker transmission.   

## 2018-02-14 ENCOUNTER — Encounter: Payer: Self-pay | Admitting: Cardiology

## 2018-02-20 NOTE — Progress Notes (Signed)
Cardiac clearance 01-09-18 epic tele note  Medical clearance Dr Redmond School on chart   lov cardiac Dr Crissie Sickles 08-11-17 epic   lov Dr Redmond School 01-26-18 epic   ekg 08-11-17 epic

## 2018-02-20 NOTE — Patient Instructions (Signed)
[name].  02/20/2018   Your procedure is scheduled on: 02-27-18   Report to [facility] Main  Entrance    Report to admitting at 10:00AM    Call this number if you have problems the morning of surgery [phone]     Remember: Do not eat food or drink liquids :After Midnight.     Take these medicines the morning of surgery with A SIP OF WATER: carvedilol(coreg), tylenol if needed                                You may not have any metal on your body including hair pins and              piercings  Do not wear jewelry, make-up, lotions, powders or perfumes, deodorant                      Men may shave face and neck.   Do not bring valuables to the hospital. [place].  Contacts, dentures or bridgework may not be worn into surgery.  Leave suitcase in the car. After surgery it may be brought to your room.                 Please read over the following fact sheets you were given: _____________________________________________________________________             [facility] - Preparing for Surgery Before surgery, you can play an important role.  Because skin is not sterile, your skin needs to be as free of germs as possible.  You can reduce the number of germs on your skin by washing with CHG (chlorahexidine gluconate) soap before surgery.  CHG is an antiseptic cleaner which kills germs and bonds with the skin to continue killing germs even after washing. Please DO NOT use if you have an allergy to CHG or antibacterial soaps.  If your skin becomes reddened/irritated stop using the CHG and inform your nurse when you arrive at Short Stay. Do not shave (including legs and underarms) for at least 48 hours prior to the first CHG shower.  You may shave your face/neck. Please follow these instructions carefully:  1.  Shower with CHG Soap the night before surgery and the  morning of Surgery.  2.  If you  choose to wash your hair, wash your hair first as usual with your  normal  shampoo.  3.  After you shampoo, rinse your hair and body thoroughly to remove the  shampoo.                           4.  Use CHG as you would any other liquid soap.  You can apply chg directly  to the skin and wash                       Gently with a scrungie or clean washcloth.  5.  Apply the CHG Soap to your body ONLY FROM THE NECK DOWN.   Do not use on face/ open  Wound or open sores. Avoid contact with eyes, ears mouth and genitals (private parts).                       Wash face,  Genitals (private parts) with your normal soap.             6.  Wash thoroughly, paying special attention to the area where your surgery  will be performed.  7.  Thoroughly rinse your body with warm water from the neck down.  8.  DO NOT shower/wash with your normal soap after using and rinsing off  the CHG Soap.                9.  Pat yourself dry with a clean towel.            10.  Wear clean pajamas.            11.  Place clean sheets on your bed the night of your first shower and do not  sleep with pets. Day of Surgery : Do not apply any lotions/deodorants the morning of surgery.  Please wear clean clothes to the hospital/surgery center.  FAILURE TO FOLLOW THESE INSTRUCTIONS MAY RESULT IN THE CANCELLATION OF YOUR SURGERY PATIENT SIGNATURE_________________________________  NURSE SIGNATURE__________________________________  ________________________________________________________________________   [name]  An incentive spirometer is a tool that can help keep your lungs clear and active. This tool measures how well you are filling your lungs with each breath. Taking long deep breaths may help reverse or decrease the chance of developing breathing (pulmonary) problems (especially infection) following:  A long period of time when you are unable to move or be active. BEFORE THE PROCEDURE   If  the spirometer includes an indicator to show your best effort, your nurse or respiratory therapist will set it to a desired goal.  If possible, sit up straight or lean slightly forward. Try not to slouch.  Hold the incentive spirometer in an upright position. INSTRUCTIONS FOR USE  1. Sit on the edge of your bed if possible, or sit up as far as you can in bed or on a chair. 2. Hold the incentive spirometer in an upright position. 3. Breathe out normally. 4. Place the mouthpiece in your mouth and seal your lips tightly around it. 5. Breathe in slowly and as deeply as possible, raising the piston or the ball toward the top of the column. 6. Hold your breath for 3-5 seconds or for as long as possible. Allow the piston or ball to fall to the bottom of the column. 7. Remove the mouthpiece from your mouth and breathe out normally. 8. Rest for a few seconds and repeat Steps 1 through 7 at least 10 times every 1-2 hours when you are awake. Take your time and take a few normal breaths between deep breaths. 9. The spirometer may include an indicator to show your best effort. Use the indicator as a goal to work toward during each repetition. 10. After each set of 10 deep breaths, practice coughing to be sure your lungs are clear. If you have an incision (the cut made at the time of surgery), support your incision when coughing by placing a pillow or rolled up towels firmly against it. Once you are able to get out of bed, walk around indoors and cough well. You may stop using the incentive spirometer when instructed by your caregiver.  RISKS AND COMPLICATIONS  Take your time so you do not get  dizzy or light-headed.  If you are in pain, you may need to take or ask for pain medication before doing incentive spirometry. It is harder to take a deep breath if you are having pain. AFTER USE  Rest and breathe slowly and easily.  It can be helpful to keep track of a log of your progress. Your caregiver can  provide you with a simple table to help with this. If you are using the spirometer at home, follow these instructions: [name] IF:   You are having difficultly using the spirometer.  You have trouble using the spirometer as often as instructed.  Your pain medication is not giving enough relief while using the spirometer.  You develop fever of 100.5 F (38.1 C) or higher. SEEK IMMEDIATE MEDICAL CARE IF:   You cough up bloody sputum that had not been present before.  You develop fever of 102 F (38.9 C) or greater.  You develop worsening pain at or near the incision site. MAKE SURE YOU:   Understand these instructions.  Will watch your condition.  Will get help right away if you are not doing well or get worse. Document Released: 01/09/2007 Document Revised: 11/21/2011 Document Reviewed: 03/12/2007 ExitCare Patient Information 2014 Exit[employer], [place].   ________________________________________________________________________  WHAT IS A BLOOD TRANSFUSION? Blood Transfusion Information  A transfusion is the replacement of blood or some of its parts. Blood is made up of multiple cells which provide different functions.  Red blood cells carry oxygen and are used for blood loss replacement.  Janis blood cells fight against infection.  Platelets control bleeding.  Plasma helps clot blood.  Other blood products are available for specialized needs, such as hemophilia or other clotting disorders. BEFORE THE TRANSFUSION  Who gives blood for transfusions?   Healthy volunteers who are fully evaluated to make sure their blood is safe. This is blood bank blood. Transfusion therapy is the safest it has ever been in the practice of medicine. Before blood is taken from a donor, a complete history is taken to make sure that person has no history of diseases nor engages in risky social behavior (examples are intravenous drug use or sexual activity with multiple partners). The  donor's travel history is screened to minimize risk of transmitting infections, such as malaria. The donated blood is tested for signs of infectious diseases, such as HIV and hepatitis. The blood is then tested to be sure it is compatible with you in order to minimize the chance of a transfusion reaction. If you or a relative donates blood, this is often done in anticipation of surgery and is not appropriate for emergency situations. It takes many days to process the donated blood. RISKS AND COMPLICATIONS Although transfusion therapy is very safe and saves many lives, the main dangers of transfusion include:   Getting an infectious disease.  Developing a transfusion reaction. This is an allergic reaction to something in the blood you were given. Every precaution is taken to prevent this. The decision to have a blood transfusion has been considered carefully by your caregiver before blood is given. Blood is not given unless the benefits outweigh the risks. AFTER THE TRANSFUSION  Right after receiving a blood transfusion, you will usually feel much better and more energetic. This is especially true if your red blood cells have gotten low (anemic). The transfusion raises the level of the red blood cells which carry oxygen, and this usually causes an energy increase.  The nurse administering the transfusion will  monitor you carefully for complications. HOME CARE INSTRUCTIONS  No special instructions are needed after a transfusion. You may find your energy is better. Speak with your caregiver about any limitations on activity for underlying diseases you may have. SEEK MEDICAL CARE IF:   Your condition is not improving after your transfusion.  You develop redness or irritation at the intravenous (IV) site. SEEK IMMEDIATE MEDICAL CARE IF:  Any of the following symptoms occur over the next 12 hours:  Shaking chills.  You have a temperature by mouth above 102 F (38.9 C), not controlled by  medicine.  Chest, back, or muscle pain.  People around you feel you are not acting correctly or are confused.  Shortness of breath or difficulty breathing.  Dizziness and fainting.  You get a rash or develop hives.  You have a decrease in urine output.  Your urine turns a dark color or changes to pink, red, or brown. Any of the following symptoms occur over the next 10 days:  You have a temperature by mouth above 102 F (38.9 C), not controlled by medicine.  Shortness of breath.  Weakness after normal activity.  The white part of the eye turns yellow (jaundice).  You have a decrease in the amount of urine or are urinating less often.  Your urine turns a dark color or changes to pink, red, or brown. Document Released: 08/26/2000 Document Revised: 11/21/2011 Document Reviewed: 04/14/2008 [facility] Patient Information 2014 [place], [place].  _______________________________________________________________________

## 2018-02-21 ENCOUNTER — Other Ambulatory Visit: Payer: Self-pay

## 2018-02-21 ENCOUNTER — Encounter (HOSPITAL_COMMUNITY)
Admission: RE | Admit: 2018-02-21 | Discharge: 2018-02-21 | Disposition: A | Discharge status: Home / Self Care | Payer: Medicare Other | Source: Ambulatory Visit | Attending: Orthopedic Surgery | Admitting: Orthopedic Surgery

## 2018-02-21 ENCOUNTER — Encounter (HOSPITAL_COMMUNITY): Payer: Self-pay

## 2018-02-21 DIAGNOSIS — M1711 Unilateral primary osteoarthritis, right knee: Secondary | ICD-10-CM | POA: Insufficient documentation

## 2018-02-21 DIAGNOSIS — Z01812 Encounter for preprocedural laboratory examination: Secondary | ICD-10-CM | POA: Diagnosis not present

## 2018-02-21 HISTORY — DX: Unspecified osteoarthritis, unspecified site: M19.90

## 2018-02-21 LAB — BASIC METABOLIC PANEL
Anion gap: 7 (ref 5–15)
BUN: 23 mg/dL — AB (ref 6–20)
CHLORIDE: 106 mmol/L (ref 101–111)
CO2: 30 mmol/L (ref 22–32)
CREATININE: 1.56 mg/dL — AB (ref 0.61–1.24)
Calcium: 9.7 mg/dL (ref 8.9–10.3)
GFR calc Af Amer: 45 mL/min — ABNORMAL LOW (ref >60)
GFR, EST NON AFRICAN AMERICAN: 39 mL/min — AB (ref >60)
GLUCOSE: 93 mg/dL (ref 65–99)
Potassium: 5.3 mmol/L — ABNORMAL HIGH (ref 3.5–5.1)
SODIUM: 143 mmol/L (ref 135–145)

## 2018-02-21 LAB — CBC
HCT: 45.3 % (ref 39.0–52.0)
Hemoglobin: 14.7 g/dL (ref 13.0–17.0)
MCH: 31.3 pg (ref 26.0–34.0)
MCHC: 32.5 g/dL (ref 30.0–36.0)
MCV: 96.6 fL (ref 78.0–100.0)
PLATELETS: 134 10*3/uL — AB (ref 150–400)
RBC: 4.69 MIL/uL (ref 4.22–5.81)
RDW: 14.3 % (ref 11.5–15.5)
WBC: 5.9 10*3/uL (ref 4.0–10.5)

## 2018-02-21 LAB — ABO/RH: ABO/RH(D): O POS

## 2018-02-21 LAB — SURGICAL PCR SCREEN
MRSA, PCR: NEGATIVE
Staphylococcus aureus: POSITIVE — AB

## 2018-02-21 NOTE — Progress Notes (Signed)
Bmp routed via epic to dr olin  

## 2018-02-26 MED ORDER — TRANEXAMIC ACID 1000 MG/10ML IV SOLN
1000.0000 mg | INTRAVENOUS | Status: AC
  Administered 2018-02-27: 1000 mg via INTRAVENOUS
  Filled 2018-02-26: qty 1100

## 2018-02-27 ENCOUNTER — Encounter (HOSPITAL_COMMUNITY): Payer: Self-pay | Admitting: Emergency Medicine

## 2018-02-27 ENCOUNTER — Inpatient Hospital Stay (HOSPITAL_COMMUNITY): Payer: Medicare Other | Admitting: Anesthesiology

## 2018-02-27 ENCOUNTER — Encounter (HOSPITAL_COMMUNITY)
Admission: RE | Disposition: A | Discharge status: Home / Self Care | Payer: Self-pay | Source: Ambulatory Visit | Attending: Orthopedic Surgery

## 2018-02-27 ENCOUNTER — Other Ambulatory Visit: Payer: Self-pay

## 2018-02-27 ENCOUNTER — Observation Stay (HOSPITAL_COMMUNITY)
Admission: RE | Admit: 2018-02-27 | Discharge: 2018-02-28 | Disposition: A | Discharge status: Home / Self Care | Payer: Medicare Other | Source: Ambulatory Visit | Attending: Orthopedic Surgery | Admitting: Orthopedic Surgery

## 2018-02-27 DIAGNOSIS — Z96659 Presence of unspecified artificial knee joint: Secondary | ICD-10-CM

## 2018-02-27 DIAGNOSIS — M659 Synovitis and tenosynovitis, unspecified: Secondary | ICD-10-CM | POA: Insufficient documentation

## 2018-02-27 DIAGNOSIS — I4891 Unspecified atrial fibrillation: Secondary | ICD-10-CM | POA: Diagnosis not present

## 2018-02-27 DIAGNOSIS — M1711 Unilateral primary osteoarthritis, right knee: Principal | ICD-10-CM | POA: Insufficient documentation

## 2018-02-27 DIAGNOSIS — Z87891 Personal history of nicotine dependence: Secondary | ICD-10-CM | POA: Diagnosis not present

## 2018-02-27 DIAGNOSIS — Z7901 Long term (current) use of anticoagulants: Secondary | ICD-10-CM | POA: Diagnosis not present

## 2018-02-27 DIAGNOSIS — I129 Hypertensive chronic kidney disease with stage 1 through stage 4 chronic kidney disease, or unspecified chronic kidney disease: Secondary | ICD-10-CM | POA: Diagnosis not present

## 2018-02-27 DIAGNOSIS — E669 Obesity, unspecified: Secondary | ICD-10-CM | POA: Diagnosis not present

## 2018-02-27 DIAGNOSIS — M25761 Osteophyte, right knee: Secondary | ICD-10-CM | POA: Diagnosis not present

## 2018-02-27 DIAGNOSIS — M25461 Effusion, right knee: Secondary | ICD-10-CM | POA: Diagnosis not present

## 2018-02-27 DIAGNOSIS — N183 Chronic kidney disease, stage 3 (moderate): Secondary | ICD-10-CM | POA: Insufficient documentation

## 2018-02-27 DIAGNOSIS — I130 Hypertensive heart and chronic kidney disease with heart failure and stage 1 through stage 4 chronic kidney disease, or unspecified chronic kidney disease: Secondary | ICD-10-CM | POA: Insufficient documentation

## 2018-02-27 DIAGNOSIS — I2510 Atherosclerotic heart disease of native coronary artery without angina pectoris: Secondary | ICD-10-CM | POA: Insufficient documentation

## 2018-02-27 DIAGNOSIS — Z6831 Body mass index (BMI) 31.0-31.9, adult: Secondary | ICD-10-CM | POA: Diagnosis not present

## 2018-02-27 DIAGNOSIS — I509 Heart failure, unspecified: Secondary | ICD-10-CM | POA: Insufficient documentation

## 2018-02-27 DIAGNOSIS — G8918 Other acute postprocedural pain: Secondary | ICD-10-CM | POA: Diagnosis not present

## 2018-02-27 DIAGNOSIS — Z950 Presence of cardiac pacemaker: Secondary | ICD-10-CM | POA: Diagnosis not present

## 2018-02-27 DIAGNOSIS — H409 Unspecified glaucoma: Secondary | ICD-10-CM | POA: Insufficient documentation

## 2018-02-27 DIAGNOSIS — E785 Hyperlipidemia, unspecified: Secondary | ICD-10-CM | POA: Insufficient documentation

## 2018-02-27 DIAGNOSIS — Z96651 Presence of right artificial knee joint: Secondary | ICD-10-CM

## 2018-02-27 HISTORY — PX: TOTAL KNEE ARTHROPLASTY: SHX125

## 2018-02-27 LAB — CBC
HCT: 43.5 % (ref 39.0–52.0)
Hemoglobin: 14 g/dL (ref 13.0–17.0)
MCH: 31.2 pg (ref 26.0–34.0)
MCHC: 32.2 g/dL (ref 30.0–36.0)
MCV: 96.9 fL (ref 78.0–100.0)
PLATELETS: 121 10*3/uL — AB (ref 150–400)
RBC: 4.49 MIL/uL (ref 4.22–5.81)
RDW: 14 % (ref 11.5–15.5)
WBC: 6.4 10*3/uL (ref 4.0–10.5)

## 2018-02-27 LAB — CREATININE, SERUM
Creatinine, Ser: 1.48 mg/dL — ABNORMAL HIGH (ref 0.61–1.24)
GFR calc non Af Amer: 41 mL/min — ABNORMAL LOW (ref >60)
GFR, EST AFRICAN AMERICAN: 48 mL/min — AB (ref >60)

## 2018-02-27 LAB — PROTIME-INR
INR: 1.27
PROTHROMBIN TIME: 15.8 s — AB (ref 11.4–15.2)

## 2018-02-27 LAB — TYPE AND SCREEN
ABO/RH(D): O POS
Antibody Screen: NEGATIVE

## 2018-02-27 LAB — APTT: aPTT: 30 seconds (ref 24–36)

## 2018-02-27 SURGERY — ARTHROPLASTY, KNEE, TOTAL
Anesthesia: Spinal | Site: Knee | Laterality: Right

## 2018-02-27 MED ORDER — METHOCARBAMOL 1000 MG/10ML IJ SOLN
500.0000 mg | Freq: Four times a day (QID) | INTRAVENOUS | Status: DC | PRN
  Filled 2018-02-27: qty 5

## 2018-02-27 MED ORDER — FENTANYL CITRATE (PF) 100 MCG/2ML IJ SOLN
INTRAMUSCULAR | Status: AC
  Administered 2018-02-27: 50 ug via INTRAVENOUS
  Filled 2018-02-27: qty 2

## 2018-02-27 MED ORDER — SODIUM CHLORIDE 0.9 % IR SOLN
Status: DC | PRN
  Administered 2018-02-27: 1000 mL

## 2018-02-27 MED ORDER — ACETAMINOPHEN 325 MG PO TABS: 325.0000 mg | ORAL_TABLET | Freq: Four times a day (QID) | ORAL | Status: DC | PRN

## 2018-02-27 MED ORDER — CEFAZOLIN SODIUM-DEXTROSE 2-4 GM/100ML-% IV SOLN
2.0000 g | Freq: Four times a day (QID) | INTRAVENOUS | Status: AC
  Administered 2018-02-27 (×2): 2 g via INTRAVENOUS
  Filled 2018-02-27 (×2): qty 100

## 2018-02-27 MED ORDER — BUPIVACAINE-EPINEPHRINE (PF) 0.25% -1:200000 IJ SOLN
INTRAMUSCULAR | Status: AC
  Filled 2018-02-27: qty 30

## 2018-02-27 MED ORDER — WARFARIN - PHARMACIST DOSING INPATIENT
Freq: Every day | Status: DC
  Administered 2018-02-27: 18:00:00

## 2018-02-27 MED ORDER — SIMVASTATIN 20 MG PO TABS
20.0000 mg | ORAL_TABLET | Freq: Every day | ORAL | Status: DC
  Administered 2018-02-27: 20 mg via ORAL
  Filled 2018-02-27: qty 1

## 2018-02-27 MED ORDER — PROPOFOL 500 MG/50ML IV EMUL
INTRAVENOUS | Status: DC | PRN
  Administered 2018-02-27: 50 ug/kg/min via INTRAVENOUS

## 2018-02-27 MED ORDER — CHLORHEXIDINE GLUCONATE 4 % EX LIQD: 60.0000 mL | Freq: Once | CUTANEOUS | Status: DC

## 2018-02-27 MED ORDER — SODIUM CHLORIDE 0.9 % IJ SOLN
INTRAMUSCULAR | Status: DC | PRN
  Administered 2018-02-27: 30 mL

## 2018-02-27 MED ORDER — METOCLOPRAMIDE HCL 5 MG/ML IJ SOLN: 5.0000 mg | Freq: Three times a day (TID) | INTRAMUSCULAR | Status: DC | PRN

## 2018-02-27 MED ORDER — METHOCARBAMOL 500 MG PO TABS: 500.0000 mg | ORAL_TABLET | Freq: Four times a day (QID) | ORAL | Status: DC | PRN

## 2018-02-27 MED ORDER — DEXAMETHASONE SODIUM PHOSPHATE 10 MG/ML IJ SOLN
10.0000 mg | Freq: Once | INTRAMUSCULAR | Status: AC
  Administered 2018-02-28: 10 mg via INTRAVENOUS
  Filled 2018-02-27: qty 1

## 2018-02-27 MED ORDER — ONDANSETRON HCL 4 MG PO TABS: 4.0000 mg | ORAL_TABLET | Freq: Four times a day (QID) | ORAL | Status: DC | PRN

## 2018-02-27 MED ORDER — PHENYLEPHRINE 40 MCG/ML (10ML) SYRINGE FOR IV PUSH (FOR BLOOD PRESSURE SUPPORT)
PREFILLED_SYRINGE | INTRAVENOUS | Status: AC
  Filled 2018-02-27: qty 10

## 2018-02-27 MED ORDER — DIPHENHYDRAMINE HCL 12.5 MG/5ML PO ELIX: 12.5000 mg | ORAL_SOLUTION | ORAL | Status: DC | PRN

## 2018-02-27 MED ORDER — STERILE WATER FOR IRRIGATION IR SOLN
Status: DC | PRN
  Administered 2018-02-27: 2000 mL

## 2018-02-27 MED ORDER — HYDROCODONE-ACETAMINOPHEN 7.5-325 MG PO TABS: 1.0000 | ORAL_TABLET | ORAL | 0 refills | Status: DC | PRN

## 2018-02-27 MED ORDER — ROPIVACAINE HCL 7.5 MG/ML IJ SOLN
INTRAMUSCULAR | Status: DC | PRN
  Administered 2018-02-27 (×4): 5 mL via PERINEURAL

## 2018-02-27 MED ORDER — ONDANSETRON HCL 4 MG/2ML IJ SOLN
INTRAMUSCULAR | Status: AC
  Filled 2018-02-27: qty 2

## 2018-02-27 MED ORDER — LATANOPROST 0.005 % OP SOLN
1.0000 [drp] | Freq: Every day | OPHTHALMIC | Status: DC
Start: 2018-02-27 — End: 2018-02-28
  Administered 2018-02-27: 1 [drp] via OPHTHALMIC
  Filled 2018-02-27: qty 2.5

## 2018-02-27 MED ORDER — WARFARIN SODIUM 3 MG PO TABS
3.5000 mg | ORAL_TABLET | Freq: Once | ORAL | Status: AC
  Administered 2018-02-27: 3.5 mg via ORAL
  Filled 2018-02-27 (×2): qty 1

## 2018-02-27 MED ORDER — KETOROLAC TROMETHAMINE 30 MG/ML IJ SOLN
INTRAMUSCULAR | Status: AC
  Filled 2018-02-27: qty 1

## 2018-02-27 MED ORDER — LACTATED RINGERS IV SOLN
INTRAVENOUS | Status: DC
Start: 2018-02-27 — End: 2018-02-27
  Administered 2018-02-27: 11:00:00 via INTRAVENOUS

## 2018-02-27 MED ORDER — ENOXAPARIN SODIUM 40 MG/0.4ML ~~LOC~~ SOLN: 40.0000 mg | SUBCUTANEOUS | 0 refills | Status: DC

## 2018-02-27 MED ORDER — MIDAZOLAM HCL 2 MG/2ML IJ SOLN: 1.0000 mg | INTRAMUSCULAR | Status: DC | PRN

## 2018-02-27 MED ORDER — MORPHINE SULFATE (PF) 2 MG/ML IV SOLN: 0.5000 mg | INTRAVENOUS | Status: DC | PRN

## 2018-02-27 MED ORDER — TRANEXAMIC ACID 1000 MG/10ML IV SOLN
1000.0000 mg | Freq: Once | INTRAVENOUS | Status: AC
  Administered 2018-02-27: 1000 mg via INTRAVENOUS
  Filled 2018-02-27: qty 1100

## 2018-02-27 MED ORDER — DOCUSATE SODIUM 100 MG PO CAPS: 100.0000 mg | ORAL_CAPSULE | Freq: Two times a day (BID) | ORAL | 0 refills | Status: DC

## 2018-02-27 MED ORDER — ENOXAPARIN SODIUM 40 MG/0.4ML ~~LOC~~ SOLN
40.0000 mg | SUBCUTANEOUS | Status: DC
  Administered 2018-02-28: 40 mg via SUBCUTANEOUS
  Filled 2018-02-27: qty 0.4

## 2018-02-27 MED ORDER — FENTANYL CITRATE (PF) 100 MCG/2ML IJ SOLN: 25.0000 ug | INTRAMUSCULAR | Status: DC | PRN

## 2018-02-27 MED ORDER — DOCUSATE SODIUM 100 MG PO CAPS
100.0000 mg | ORAL_CAPSULE | Freq: Two times a day (BID) | ORAL | Status: DC
  Administered 2018-02-27: 100 mg via ORAL
  Filled 2018-02-27 (×2): qty 1

## 2018-02-27 MED ORDER — CARVEDILOL 3.125 MG PO TABS
3.1250 mg | ORAL_TABLET | Freq: Two times a day (BID) | ORAL | Status: DC
  Administered 2018-02-27 – 2018-02-28 (×2): 3.125 mg via ORAL
  Filled 2018-02-27 (×2): qty 1

## 2018-02-27 MED ORDER — FERROUS SULFATE 325 (65 FE) MG PO TABS
325.0000 mg | ORAL_TABLET | Freq: Three times a day (TID) | ORAL | Status: DC
  Administered 2018-02-27 – 2018-02-28 (×3): 325 mg via ORAL
  Filled 2018-02-27 (×3): qty 1

## 2018-02-27 MED ORDER — PHENYLEPHRINE HCL 10 MG/ML IJ SOLN
INTRAMUSCULAR | Status: DC | PRN
  Administered 2018-02-27: 25 ug/min via INTRAVENOUS

## 2018-02-27 MED ORDER — HYDROCODONE-ACETAMINOPHEN 7.5-325 MG PO TABS
1.0000 | ORAL_TABLET | ORAL | Status: DC | PRN
  Administered 2018-02-27: 1 via ORAL
  Administered 2018-02-28: 2 via ORAL
  Filled 2018-02-27 (×2): qty 2
  Filled 2018-02-27: qty 1

## 2018-02-27 MED ORDER — MIDAZOLAM HCL 2 MG/2ML IJ SOLN
INTRAMUSCULAR | Status: AC
  Filled 2018-02-27: qty 2

## 2018-02-27 MED ORDER — DOXAZOSIN MESYLATE 4 MG PO TABS
4.0000 mg | ORAL_TABLET | Freq: Every day | ORAL | Status: DC
Start: 2018-02-27 — End: 2018-02-28
  Administered 2018-02-27: 4 mg via ORAL
  Filled 2018-02-27 (×2): qty 1

## 2018-02-27 MED ORDER — BUPIVACAINE-EPINEPHRINE (PF) 0.25% -1:200000 IJ SOLN
INTRAMUSCULAR | Status: DC | PRN
  Administered 2018-02-27: 30 mL

## 2018-02-27 MED ORDER — CEFAZOLIN SODIUM-DEXTROSE 2-4 GM/100ML-% IV SOLN
2.0000 g | INTRAVENOUS | Status: AC
  Administered 2018-02-27: 2 g via INTRAVENOUS

## 2018-02-27 MED ORDER — BISACODYL 10 MG RE SUPP: 10.0000 mg | Freq: Every day | RECTAL | Status: DC | PRN

## 2018-02-27 MED ORDER — ALUM & MAG HYDROXIDE-SIMETH 200-200-20 MG/5ML PO SUSP: 15.0000 mL | ORAL | Status: DC | PRN

## 2018-02-27 MED ORDER — BUPIVACAINE IN DEXTROSE 0.75-8.25 % IT SOLN
INTRATHECAL | Status: DC | PRN
  Administered 2018-02-27: 1.4 mL via INTRATHECAL

## 2018-02-27 MED ORDER — MEPERIDINE HCL 50 MG/ML IJ SOLN
6.2500 mg | INTRAMUSCULAR | Status: DC | PRN
Start: 2018-02-27 — End: 2018-02-27

## 2018-02-27 MED ORDER — PROMETHAZINE HCL 25 MG/ML IJ SOLN: 6.2500 mg | INTRAMUSCULAR | Status: DC | PRN

## 2018-02-27 MED ORDER — DEXAMETHASONE SODIUM PHOSPHATE 10 MG/ML IJ SOLN
INTRAMUSCULAR | Status: AC
  Filled 2018-02-27: qty 1

## 2018-02-27 MED ORDER — DEXAMETHASONE SODIUM PHOSPHATE 10 MG/ML IJ SOLN
10.0000 mg | Freq: Once | INTRAMUSCULAR | Status: AC
  Administered 2018-02-27: 10 mg via INTRAVENOUS

## 2018-02-27 MED ORDER — PROPOFOL 10 MG/ML IV BOLUS
INTRAVENOUS | Status: AC
  Filled 2018-02-27: qty 40

## 2018-02-27 MED ORDER — ONDANSETRON HCL 4 MG/2ML IJ SOLN
INTRAMUSCULAR | Status: DC | PRN
  Administered 2018-02-27: 4 mg via INTRAVENOUS

## 2018-02-27 MED ORDER — KETOROLAC TROMETHAMINE 30 MG/ML IJ SOLN
INTRAMUSCULAR | Status: DC | PRN
  Administered 2018-02-27: 30 mg

## 2018-02-27 MED ORDER — PHENOL 1.4 % MT LIQD
1.0000 | OROMUCOSAL | Status: DC | PRN
  Filled 2018-02-27: qty 177

## 2018-02-27 MED ORDER — FERROUS SULFATE 325 (65 FE) MG PO TABS: 325.0000 mg | ORAL_TABLET | Freq: Three times a day (TID) | ORAL | 3 refills | Status: DC

## 2018-02-27 MED ORDER — SODIUM CHLORIDE 0.9 % IV SOLN
INTRAVENOUS | Status: DC
  Administered 2018-02-27 – 2018-02-28 (×2): via INTRAVENOUS

## 2018-02-27 MED ORDER — CEFAZOLIN SODIUM-DEXTROSE 2-4 GM/100ML-% IV SOLN
INTRAVENOUS | Status: AC
  Filled 2018-02-27: qty 100

## 2018-02-27 MED ORDER — HYDROCODONE-ACETAMINOPHEN 5-325 MG PO TABS: 1.0000 | ORAL_TABLET | ORAL | Status: DC | PRN

## 2018-02-27 MED ORDER — SODIUM CHLORIDE 0.9 % IJ SOLN
INTRAMUSCULAR | Status: AC
  Filled 2018-02-27: qty 50

## 2018-02-27 MED ORDER — ROPIVACAINE HCL 5 MG/ML IJ SOLN
INTRAMUSCULAR | Status: DC | PRN
  Administered 2018-02-27 (×2): 5 mL via PERINEURAL

## 2018-02-27 MED ORDER — PROPOFOL 500 MG/50ML IV EMUL
INTRAVENOUS | Status: DC | PRN
  Administered 2018-02-27 (×2): 10 mg via INTRAVENOUS

## 2018-02-27 MED ORDER — POLYETHYLENE GLYCOL 3350 17 G PO PACK: 17.0000 g | PACK | Freq: Two times a day (BID) | ORAL | 0 refills | Status: DC

## 2018-02-27 MED ORDER — MENTHOL 3 MG MT LOZG: 1.0000 | LOZENGE | OROMUCOSAL | Status: DC | PRN

## 2018-02-27 MED ORDER — POLYETHYLENE GLYCOL 3350 17 G PO PACK
17.0000 g | PACK | Freq: Two times a day (BID) | ORAL | Status: DC
  Administered 2018-02-27: 17 g via ORAL
  Filled 2018-02-27 (×2): qty 1

## 2018-02-27 MED ORDER — PHENYLEPHRINE HCL 10 MG/ML IJ SOLN
INTRAMUSCULAR | Status: DC | PRN
  Administered 2018-02-27: 160 ug via INTRAVENOUS
  Administered 2018-02-27 (×2): 80 ug via INTRAVENOUS

## 2018-02-27 MED ORDER — MAGNESIUM CITRATE PO SOLN: 1.0000 | Freq: Once | ORAL | Status: DC | PRN

## 2018-02-27 MED ORDER — METOCLOPRAMIDE HCL 5 MG PO TABS: 5.0000 mg | ORAL_TABLET | Freq: Three times a day (TID) | ORAL | Status: DC | PRN

## 2018-02-27 MED ORDER — ONDANSETRON HCL 4 MG/2ML IJ SOLN: 4.0000 mg | Freq: Four times a day (QID) | INTRAMUSCULAR | Status: DC | PRN

## 2018-02-27 MED ORDER — FENTANYL CITRATE (PF) 100 MCG/2ML IJ SOLN
50.0000 ug | INTRAMUSCULAR | Status: DC | PRN
  Administered 2018-02-27: 50 ug via INTRAVENOUS

## 2018-02-27 MED ORDER — CELECOXIB 200 MG PO CAPS
200.0000 mg | ORAL_CAPSULE | Freq: Two times a day (BID) | ORAL | Status: DC
  Administered 2018-02-27: 200 mg via ORAL
  Filled 2018-02-27: qty 1

## 2018-02-27 MED ORDER — POTASSIUM CHLORIDE CRYS ER 10 MEQ PO TBCR
10.0000 meq | EXTENDED_RELEASE_TABLET | Freq: Every day | ORAL | Status: DC
  Administered 2018-02-27 – 2018-02-28 (×2): 10 meq via ORAL
  Filled 2018-02-27 (×2): qty 1

## 2018-02-27 MED ORDER — METHOCARBAMOL 500 MG PO TABS: 500.0000 mg | ORAL_TABLET | Freq: Four times a day (QID) | ORAL | 0 refills | Status: DC | PRN

## 2018-02-27 MED ORDER — PHENYLEPHRINE HCL 10 MG/ML IJ SOLN
INTRAMUSCULAR | Status: AC
  Filled 2018-02-27: qty 1

## 2018-02-27 SURGICAL SUPPLY — 48 items
ADH SKN CLS APL DERMABOND .7 (GAUZE/BANDAGES/DRESSINGS) ×1
BAG SPEC THK2 15X12 ZIP CLS (MISCELLANEOUS)
BAG ZIPLOCK 12X15 (MISCELLANEOUS) IMPLANT
BANDAGE ACE 6X5 VEL STRL LF (GAUZE/BANDAGES/DRESSINGS) ×3 IMPLANT
BLADE SAW SGTL 11.0X1.19X90.0M (BLADE) IMPLANT
BLADE SAW SGTL 13.0X1.19X90.0M (BLADE) ×3 IMPLANT
BOWL SMART MIX CTS (DISPOSABLE) ×3 IMPLANT
CAPT KNEE TOTAL 3 ATTUNE ×2 IMPLANT
CEMENT HV SMART SET (Cement) ×4 IMPLANT
COVER SURGICAL LIGHT HANDLE (MISCELLANEOUS) ×3 IMPLANT
CUFF TOURN SGL QUICK 34 (TOURNIQUET CUFF) ×3
CUFF TRNQT CYL 34X4X40X1 (TOURNIQUET CUFF) ×1 IMPLANT
DECANTER SPIKE VIAL GLASS SM (MISCELLANEOUS) ×6 IMPLANT
DERMABOND ADVANCED (GAUZE/BANDAGES/DRESSINGS) ×2
DERMABOND ADVANCED .7 DNX12 (GAUZE/BANDAGES/DRESSINGS) ×1 IMPLANT
DRAPE U-SHAPE 47X51 STRL (DRAPES) ×3 IMPLANT
DRESSING AQUACEL AG SP 3.5X10 (GAUZE/BANDAGES/DRESSINGS) ×1 IMPLANT
DRSG AQUACEL AG SP 3.5X10 (GAUZE/BANDAGES/DRESSINGS) ×3
DURAPREP 26ML APPLICATOR (WOUND CARE) ×6 IMPLANT
ELECT REM PT RETURN 15FT ADLT (MISCELLANEOUS) ×3 IMPLANT
GLOVE BIOGEL M 7.0 STRL (GLOVE) IMPLANT
GLOVE BIOGEL PI IND STRL 7.5 (GLOVE) ×1 IMPLANT
GLOVE BIOGEL PI IND STRL 8.5 (GLOVE) ×1 IMPLANT
GLOVE BIOGEL PI INDICATOR 7.5 (GLOVE) ×10
GLOVE BIOGEL PI INDICATOR 8.5 (GLOVE)
GLOVE ECLIPSE 8.0 STRL XLNG CF (GLOVE) ×5 IMPLANT
GLOVE ORTHO TXT STRL SZ7.5 (GLOVE) ×6 IMPLANT
GOWN STRL REUS W/TWL 2XL LVL3 (GOWN DISPOSABLE) ×3 IMPLANT
GOWN STRL REUS W/TWL LRG LVL3 (GOWN DISPOSABLE) ×3 IMPLANT
HANDPIECE INTERPULSE COAX TIP (DISPOSABLE) ×3
HOLDER FOLEY CATH W/STRAP (MISCELLANEOUS) IMPLANT
MANIFOLD NEPTUNE II (INSTRUMENTS) ×3 IMPLANT
NDL SAFETY ECLIPSE 18X1.5 (NEEDLE) IMPLANT
NEEDLE HYPO 18GX1.5 SHARP (NEEDLE)
PACK TOTAL KNEE CUSTOM (KITS) ×3 IMPLANT
POSITIONER SURGICAL ARM (MISCELLANEOUS) ×3 IMPLANT
SET HNDPC FAN SPRY TIP SCT (DISPOSABLE) ×1 IMPLANT
SET PAD KNEE POSITIONER (MISCELLANEOUS) ×3 IMPLANT
SUT MNCRL AB 4-0 PS2 18 (SUTURE) ×3 IMPLANT
SUT STRATAFIX PDS+ 0 24IN (SUTURE) ×3 IMPLANT
SUT VIC AB 1 CT1 36 (SUTURE) ×3 IMPLANT
SUT VIC AB 2-0 CT1 27 (SUTURE) ×9
SUT VIC AB 2-0 CT1 TAPERPNT 27 (SUTURE) ×3 IMPLANT
SYRINGE 3CC LL L/F (MISCELLANEOUS) ×3 IMPLANT
TRAY FOLEY MTR SLVR 16FR STAT (SET/KITS/TRAYS/PACK) ×3 IMPLANT
WATER STERILE IRR 1000ML POUR (IV SOLUTION) ×3 IMPLANT
WRAP KNEE MAXI GEL POST OP (GAUZE/BANDAGES/DRESSINGS) ×3 IMPLANT
YANKAUER SUCT BULB TIP 10FT TU (MISCELLANEOUS) ×3 IMPLANT

## 2018-02-27 NOTE — Anesthesia Procedure Notes (Signed)
Date/Time: 02/27/2018 12:06 PM Performed by: Glory Buff, CRNA Oxygen Delivery Method: Simple face mask

## 2018-02-27 NOTE — Anesthesia Procedure Notes (Signed)
Spinal  Patient location during procedure: OR Start time: 02/27/2018 12:09 PM End time: 02/27/2018 12:12 PM Staffing Anesthesiologist: Lyn Hollingshead, MD Performed: anesthesiologist  Preanesthetic Checklist Completed: patient identified, site marked, surgical consent, pre-op evaluation, timeout performed, IV checked, risks and benefits discussed and monitors and equipment checked Spinal Block Patient position: sitting Prep: ChloraPrep and site prepped and draped Patient monitoring: continuous pulse ox and blood pressure Approach: midline Location: L2-3 Injection technique: single-shot Needle Needle type: Pencan  Needle gauge: 24 G Needle length: 10 cm Needle insertion depth: 6 cm Assessment Sensory level: T8

## 2018-02-27 NOTE — Progress Notes (Signed)
ANTICOAGULATION CONSULT NOTE - Initial Consult  Pharmacy Consult for warfarin Indication:VTE prophylaxis  Allergies  Allergen Reactions  . Tramadol Itching    Patient Measurements: Height: 5\' 4"  (162.6 cm) Weight: 184 lb (83.5 kg) IBW/kg (Calculated) : 59.2  Vital Signs: Temp: 97.8 F (36.6 C) (06/18 1008) Temp Source: Oral (06/18 1008) BP: 178/95 (06/18 1145) Pulse Rate: 68 (06/18 1158)  Labs: Recent Labs    02/27/18 1137  APTT 30  LABPROT 15.8*  INR 1.27    Estimated Creatinine Clearance: 33.7 mL/min (A) (by C-G formula based on SCr of 1.56 mg/dL (H)).   Medical History: Past Medical History:  Diagnosis Date  . A-fib ([place])   . Arthritis   . CAD (coronary artery disease)    GXT, neg bruce protocol GXT  . Chronic kidney disease    RENAL INSUFFICIENCY  . Diverticulosis   . ED (erectile dysfunction)   . GERD (gastroesophageal reflux disease)   . Glaucoma   . Gout    no flare in years   . Heart disorder   . Hypertension   . Obesity     Assessment: 84 yo male with hx of pain and functional disability in the rt knee presents for TKA.  Pt takes warfarin 2.5mg  daily for hx of AFib, last dose prior to surgery was 6/12.  Today INR 1.27. Pharmacy consulted to dose warfarin.  Goal of Therapy:  INR 2-3   Plan:  Warfarin 3.5mg  po x 1 tonight Daily INR Enoxaparin bridge per MD, continue til INR therapeutic  Dolly Rias RPh 02/27/2018, 1:32 PM Pager [phone]

## 2018-02-27 NOTE — Interval H&P Note (Signed)
History and Physical Interval Note:  02/27/2018 10:48 AM  [name].  has presented today for surgery, with the diagnosis of Right knee osteoarthritis  The various methods of treatment have been discussed with the patient and family. After consideration of risks, benefits and other options for treatment, the patient has consented to  Procedure(s) with comments: RIGHT TOTAL KNEE ARTHROPLASTY (Right) - 70 mins as a surgical intervention .  The patient's history has been reviewed, patient examined, no change in status, stable for surgery.  I have reviewed the patient's chart and labs.  Questions were answered to the patient's satisfaction.     Mauri Pole

## 2018-02-27 NOTE — Progress Notes (Signed)
Assisted Dr. Hatchett with right, ultrasound guided, adductor canal block. Side rails up, monitors on throughout procedure. See vital signs in flow sheet. Tolerated Procedure well.  

## 2018-02-27 NOTE — Anesthesia Postprocedure Evaluation (Signed)
Anesthesia Post Note  Patient: [name].  Procedure(s) Performed: RIGHT TOTAL KNEE ARTHROPLASTY (Right Knee)     Patient location during evaluation: PACU Anesthesia Type: Spinal Level of consciousness: awake Pain management: pain level controlled Vital Signs Assessment: post-procedure vital signs reviewed and stable Respiratory status: spontaneous breathing Cardiovascular status: stable Postop Assessment: no headache, no backache, spinal receding, patient able to bend at knees and no apparent nausea or vomiting Anesthetic complications: no    Last Vitals:  Vitals:   02/27/18 1545 02/27/18 1555  BP: (!) 155/95 (!) 164/97  Pulse: 76 76  Resp: 12 16  Temp: 36.6 C 36.6 C  SpO2: 100% 100%    Last Pain:  Vitals:   02/27/18 1545  TempSrc:   PainSc: 0-No pain   Pain Goal: Patients Stated Pain Goal: 4 (02/27/18 1030)               [place]

## 2018-02-27 NOTE — Transfer of Care (Signed)
Immediate Anesthesia Transfer of Care Note  Patient: [name].  Procedure(s) Performed: RIGHT TOTAL KNEE ARTHROPLASTY (Right Knee)  Patient Location: PACU  Anesthesia Type:MAC and Spinal  Level of Consciousness: awake, alert  and oriented  Airway & Oxygen Therapy: Patient Spontanous Breathing and Patient connected to face mask oxygen  Post-op Assessment: Report given to RN and Post -op Vital signs reviewed and stable  Post vital signs: Reviewed and stable  Last Vitals:  Vitals Value Taken Time  BP 124/77 02/27/2018  1:51 PM  Temp    Pulse 71 02/27/2018  1:54 PM  Resp 25 02/27/2018  1:54 PM  SpO2 100 % 02/27/2018  1:54 PM  Vitals shown include unvalidated device data.  Last Pain:  Vitals:   02/27/18 1030  TempSrc:   PainSc: 0-No pain      Patients Stated Pain Goal: 4 (16/55/37 4827)  Complications: No apparent anesthesia complications

## 2018-02-27 NOTE — Discharge Instructions (Signed)

## 2018-02-27 NOTE — Anesthesia Preprocedure Evaluation (Addendum)
Anesthesia Evaluation  Patient identified by MRN, date of birth, ID band Patient awake    Reviewed: Allergy & Precautions, NPO status , Patient's Chart, lab work & pertinent test results  Airway Mallampati: I       Dental no notable dental hx. (+) Teeth Intact   Pulmonary former smoker,    breath sounds clear to auscultation       Cardiovascular hypertension, Pt. on medications Normal cardiovascular exam Rhythm:Regular Rate:Normal     Neuro/Psych    GI/Hepatic   Endo/Other  negative endocrine ROS  Renal/GU Renal InsufficiencyRenal disease  negative genitourinary   Musculoskeletal   Abdominal (+) + obese,   Peds  Hematology   Anesthesia Other Findings [name].  CUP PACEART REMOTE DEVICE CHECK  Order# [id number]  Ordering physician: Evans Lance, MD Study date: 11/14/2017 Result Notes for CUP PACEART REMOTE DEVICE CHECK   Notes recorded by Evans Lance, MD on 11/15/2017 at 9:54 PM EST Remote device check reviewed. Histograms appropriate. Leads and battery stable for patient. Follow up as outlined above. No recommended changes.   Conclusion   Normal Remote Reviewed Remote     Reproductive/Obstetrics                             Anesthesia Physical Anesthesia Plan  ASA: II  Anesthesia Plan: Spinal   Post-op Pain Management:  Regional for Post-op pain   Induction:   PONV Risk Score and Plan: 1 and Ondansetron  Airway Management Planned: Natural Airway and Simple Face Mask  Additional Equipment:   Intra-op Plan:   Post-operative Plan:   Informed Consent: I have reviewed the patients History and Physical, chart, labs and discussed the procedure including the risks, benefits and alternatives for the proposed anesthesia with the patient or authorized representative who has indicated his/her understanding and acceptance.     Plan Discussed with: CRNA and  Surgeon  Anesthesia Plan Comments:         Anesthesia Quick Evaluation

## 2018-02-27 NOTE — Anesthesia Procedure Notes (Signed)
Anesthesia Regional Block: Adductor canal block   Pre-Anesthetic Checklist: ,, timeout performed, Correct Patient, Correct Site, Correct Laterality, Correct Procedure, Correct Position, site marked, Risks and benefits discussed,  Surgical consent,  Pre-op evaluation,  At surgeon's request and post-op pain management  Laterality: Right and Lower  Prep: chloraprep       Needles:  Injection technique: Single-shot  Needle Type: Echogenic Stimulator Needle     Needle Length: 10cm  Needle Gauge: 21   Needle insertion depth: 2 cm   Additional Needles:   Procedures:,,,, ultrasound used (permanent image in chart),,,,  Narrative:  Start time: 02/27/2018 11:29 AM End time: 02/27/2018 11:39 AM Injection made incrementally with aspirations every 5 mL.  Performed by: Personally  Anesthesiologist: Lyn Hollingshead, MD

## 2018-02-27 NOTE — Op Note (Signed)
NAME:  [name].                      MEDICAL RECORD NO.:  [id number]                             FACILITY:  [facility]      PHYSICIAN:  Pietro Cassis. Alvan Dame, M.D.  DATE OF BIRTH:  Oct 28, 1930      DATE OF PROCEDURE:  02/27/2018                                     OPERATIVE REPORT         PREOPERATIVE DIAGNOSIS:  Right knee osteoarthritis.      POSTOPERATIVE DIAGNOSIS:  Right knee osteoarthritis.      FINDINGS:  The patient was noted to have complete loss of cartilage and   bone-on-bone arthritis with associated osteophytes in the medial and patellofemoral compartments of   the knee with a significant synovitis and associated effusion.  The patient had failed months of conservative treatment including medications, injection therapy, activity modification.     PROCEDURE:  Right total knee replacement.      COMPONENTS USED:  DePuy Attune rotating platform posterior stabilized knee   system, a size 5 femur, 6 tibia, size 7 mm PS AOX insert, and 41 anatomic patellar   button.      SURGEON:  Pietro Cassis. Alvan Dame, M.D.      ASSISTANT:  Molli Barrows, PA-C.      ANESTHESIA:  Regional and Spinal.      SPECIMENS:  None.      COMPLICATION:  None.      DRAINS:  None.  EBL: <100      TOURNIQUET TIME:   Total Tourniquet Time Documented: Thigh (Right) - 30 minutes Total: Thigh (Right) - 30 minutes  .      The patient was stable to the recovery room.      INDICATION FOR PROCEDURE:  [name]. is a 84 y.o. male patient of   mine.  The patient had been seen, evaluated, and treated for months conservatively in the   office with medication, activity modification, and injections.  The patient had   radiographic changes of bone-on-bone arthritis with endplate sclerosis and osteophytes noted.  Based on the radiographic changes and failed conservative measures, the patient   decided to proceed with definitive treatment, total knee replacement.  Risks of infection, DVT, component failure,  need for revision surgery, neurovascular injury were reviewed in the office setting.  The postop course was reviewed stressing the efforts to maximize post-operative satisfaction and function.  Consent was obtained for benefit of pain   relief.      PROCEDURE IN DETAIL:  The patient was brought to the operative theater.   Once adequate anesthesia, preoperative antibiotics, 2 gm of Ancef,1 gm of Tranexamic Acid, and 10 mg of Decadron administered, the patient was positioned supine with a right thigh tourniquet placed.  The  right lower extremity was prepped and draped in sterile fashion.  A time-   out was performed identifying the patient, planned procedure, and the appropriate extremity.      The right lower extremity was placed in the [facility] leg holder.  The leg was   exsanguinated, tourniquet elevated to 250 mmHg.  A midline incision was  made followed by median parapatellar arthrotomy.  Following initial   exposure, attention was first directed to the patella.  Precut   measurement was noted to be 26 mm.  I resected down to 15 mm and used a   41 anatomic patellar button to restore patellar height as well as cover the cut surface.      The lug holes were drilled and a metal shim was placed to protect the   patella from retractors and saw blade during the procedure.      At this point, attention was now directed to the femur.  The femoral   canal was opened with a drill, irrigated to try to prevent fat emboli.  An   intramedullary rod was passed at 3 degrees valgus, 9 mm of bone was   resected off the distal femur.  Following this resection, the tibia was   subluxated anteriorly.  Using the extramedullary guide, 2 mm of bone was resected off   the proximal medial tibia.  We confirmed the gap would be   stable medially and laterally with a size 6 spacer block as well as confirmed that the tibial cut was perpendicular in the coronal plane, checking with an alignment rod.      Once this was  done, I sized the femur to be a size 5 in the anterior-   posterior dimension, chose a standard component based on medial and   lateral dimension.  The size 5 rotation block was then pinned in   position anterior referenced using the C-clamp to set rotation.  The   anterior, posterior, and  chamfer cuts were made without difficulty nor   notching making certain that I was along the anterior cortex to help   with flexion gap stability.      The final box cut was made off the lateral aspect of distal femur.      At this point, the tibia was sized to be a size 6.  The size 6 tray was   then pinned in position through the medial third of the tubercle,   drilled, and keel punched.  Trial reduction was now carried with a 5 femur,  6 tibia, a size 6 then 7 mm PS insert, and the 41 anatomic patella botton.  The knee was brought to full extension with good flexion stability with the patella   tracking through the trochlea without application of pressure.  Given   all these findings the trial components removed.  Final components were   opened and cement was mixed.  The knee was irrigated with normal saline solution and pulse lavage.  The synovial lining was   then injected with 30 cc of 0.25% Marcaine with epinephrine, 1 cc of Toradol and 30 cc of NS for a total of 61 cc.     Final implants were then cemented onto cleaned and dried cut surfaces of bone with the knee brought to extension with a size 7 mm PS trial insert.      Once the cement had fully cured, excess cement was removed   throughout the knee.  I confirmed that I was satisfied with the range of   motion and stability, and the final size 7 mm PS AOX insert was chosen.  It was   placed into the knee.      The tourniquet had been let down at 30 minutes.  No significant   hemostasis was required.  The extensor mechanism was then reapproximated using #  1 Vicryl and #1 Stratafix sutures with the knee   in flexion.  The   remaining wound was  closed with 2-0 Vicryl and running 4-0 Monocryl.   The knee was cleaned, dried, dressed sterilely using Dermabond and   Aquacel dressing.  The patient was then   brought to recovery room in stable condition, tolerating the procedure   well.   Please note that Physician Assistant, Molli Barrows, PA-C was present for the entirety of the case, and was utilized for pre-operative positioning, peri-operative retractor management, general facilitation of the procedure and for primary wound closure at the end of the case.              Pietro Cassis Alvan Dame, M.D.    02/27/2018 1:25 PM

## 2018-02-28 ENCOUNTER — Encounter (HOSPITAL_COMMUNITY): Payer: Self-pay | Admitting: Orthopedic Surgery

## 2018-02-28 DIAGNOSIS — I130 Hypertensive heart and chronic kidney disease with heart failure and stage 1 through stage 4 chronic kidney disease, or unspecified chronic kidney disease: Secondary | ICD-10-CM | POA: Diagnosis not present

## 2018-02-28 DIAGNOSIS — Z7901 Long term (current) use of anticoagulants: Secondary | ICD-10-CM | POA: Diagnosis not present

## 2018-02-28 DIAGNOSIS — N183 Chronic kidney disease, stage 3 (moderate): Secondary | ICD-10-CM | POA: Diagnosis not present

## 2018-02-28 DIAGNOSIS — Z950 Presence of cardiac pacemaker: Secondary | ICD-10-CM | POA: Diagnosis not present

## 2018-02-28 DIAGNOSIS — M25461 Effusion, right knee: Secondary | ICD-10-CM | POA: Diagnosis not present

## 2018-02-28 DIAGNOSIS — E785 Hyperlipidemia, unspecified: Secondary | ICD-10-CM | POA: Diagnosis not present

## 2018-02-28 DIAGNOSIS — M659 Synovitis and tenosynovitis, unspecified: Secondary | ICD-10-CM | POA: Diagnosis not present

## 2018-02-28 DIAGNOSIS — Z87891 Personal history of nicotine dependence: Secondary | ICD-10-CM | POA: Diagnosis not present

## 2018-02-28 DIAGNOSIS — I4891 Unspecified atrial fibrillation: Secondary | ICD-10-CM | POA: Diagnosis not present

## 2018-02-28 DIAGNOSIS — I509 Heart failure, unspecified: Secondary | ICD-10-CM | POA: Diagnosis not present

## 2018-02-28 DIAGNOSIS — H409 Unspecified glaucoma: Secondary | ICD-10-CM | POA: Diagnosis not present

## 2018-02-28 DIAGNOSIS — M1711 Unilateral primary osteoarthritis, right knee: Secondary | ICD-10-CM | POA: Diagnosis not present

## 2018-02-28 DIAGNOSIS — M25761 Osteophyte, right knee: Secondary | ICD-10-CM | POA: Diagnosis not present

## 2018-02-28 DIAGNOSIS — I2510 Atherosclerotic heart disease of native coronary artery without angina pectoris: Secondary | ICD-10-CM | POA: Diagnosis not present

## 2018-02-28 LAB — BASIC METABOLIC PANEL
Anion gap: 8 (ref 5–15)
BUN: 30 mg/dL — AB (ref 6–20)
CALCIUM: 8.3 mg/dL — AB (ref 8.9–10.3)
CO2: 25 mmol/L (ref 22–32)
Chloride: 107 mmol/L (ref 101–111)
Creatinine, Ser: 1.66 mg/dL — ABNORMAL HIGH (ref 0.61–1.24)
GFR calc Af Amer: 42 mL/min — ABNORMAL LOW (ref >60)
GFR, EST NON AFRICAN AMERICAN: 36 mL/min — AB (ref >60)
GLUCOSE: 148 mg/dL — AB (ref 65–99)
Potassium: 5 mmol/L (ref 3.5–5.1)
Sodium: 140 mmol/L (ref 135–145)

## 2018-02-28 LAB — CBC
HEMATOCRIT: 38.1 % — AB (ref 39.0–52.0)
Hemoglobin: 12.3 g/dL — ABNORMAL LOW (ref 13.0–17.0)
MCH: 31.1 pg (ref 26.0–34.0)
MCHC: 32.3 g/dL (ref 30.0–36.0)
MCV: 96.2 fL (ref 78.0–100.0)
PLATELETS: 114 10*3/uL — AB (ref 150–400)
RBC: 3.96 MIL/uL — ABNORMAL LOW (ref 4.22–5.81)
RDW: 13.9 % (ref 11.5–15.5)
WBC: 10.7 10*3/uL — AB (ref 4.0–10.5)

## 2018-02-28 LAB — PROTIME-INR
INR: 1.34
Prothrombin Time: 16.5 seconds — ABNORMAL HIGH (ref 11.4–15.2)

## 2018-02-28 MED ORDER — WARFARIN 0.5 MG HALF TABLET
3.5000 mg | ORAL_TABLET | Freq: Once | ORAL | Status: AC
  Administered 2018-02-28: 3.5 mg via ORAL
  Filled 2018-02-28: qty 1

## 2018-02-28 NOTE — Evaluation (Signed)
Physical Therapy Evaluation Patient Details Name: [name]. MRN: [id number] DOB: [birth year] Today's Date: 02/28/2018   History of Present Illness  R TKA, to have L TKA in future  Clinical Impression  The patient is progressing very well. Plans DC today if  Meets PT goals/ steps. Pt admitted with above diagnosis. Pt currently with functional limitations due to the deficits listed below (see PT Problem List). Pt will benefit from skilled PT to increase their independence and safety with mobility to allow discharge to the venue listed below.       Follow Up Recommendations Outpatient PT;Follow surgeon's recommendation for DC plan and follow-up therapies    Equipment Recommendations  None recommended by PT    Recommendations for Other Services       Precautions / Restrictions Precautions Precautions: Knee Restrictions Weight Bearing Restrictions: No      Mobility  Bed Mobility Overal bed mobility: Needs Assistance Bed Mobility: Supine to Sit     Supine to sit: Supervision        Transfers Overall transfer level: Needs assistance Equipment used: Rolling walker (2 wheeled) Transfers: Sit to/from Stand Sit to Stand: Min assist         General transfer comment: cues for hand and  right leg placement  Ambulation/Gait Ambulation/Gait assistance: Min assist Gait Distance (Feet): 200 Feet Assistive device: Rolling walker (2 wheeled) Gait Pattern/deviations: Step-to pattern;Step-through pattern;Antalgic     General Gait Details: patient  states the  right leg feels a little  longer, left knoted varus knee  Stairs            Wheelchair Mobility    Modified Rankin (Stroke Patients Only)       Balance                                             Pertinent Vitals/Pain Pain Assessment: 0-10 Pain Location: right  thigh Pain Descriptors / Indicators: Sore Pain Intervention(s): Monitored during session;Premedicated before  session;Ice applied    Home Living Family/patient expects to be discharged to:: Private residence Living Arrangements: Children Available Help at Discharge: Family Type of Home: House Home Access: Stairs to enter Entrance Stairs-Rails: Psychiatric nurse of Steps: 3 Home Layout: One level Home Equipment: Environmental consultant - 2 wheels      Prior Function Level of Independence: Independent with assistive device(s)               Hand Dominance        Extremity/Trunk Assessment   Upper Extremity Assessment Upper Extremity Assessment: Overall WFL for tasks assessed    Lower Extremity Assessment Lower Extremity Assessment: RLE deficits/detail RLE Deficits / Details: knee flexion 10-80,  +SLR    Cervical / Trunk Assessment Cervical / Trunk Assessment: Normal  Communication   Communication: No difficulties  Cognition Arousal/Alertness: Awake/alert Behavior During Therapy: WFL for tasks assessed/performed Overall Cognitive Status: Within Functional Limits for tasks assessed                                        General Comments      Exercises Total Joint Exercises Ankle Circles/Pumps: AROM;Both;10 reps Quad Sets: AROM;Both;10 reps Towel Squeeze: AROM;Right;10 reps Short Arc Quad: AROM;Right;10 reps Heel Slides: AROM;Right;10 reps Hip ABduction/ADduction: AROM;Right;10 reps Straight Leg Raises:  AROM;Right;10 reps Long Arc Quad: AROM;Right;10 reps Knee Flexion: AROM;Right;10 reps   Assessment/Plan    PT Assessment Patient needs continued PT services  PT Problem List Decreased strength;Decreased range of motion;Decreased activity tolerance;Decreased mobility;Decreased knowledge of precautions;Decreased safety awareness;Pain       PT Treatment Interventions DME instruction;Therapeutic exercise;Gait training;Stair training;Functional mobility training;Therapeutic activities;Patient/family education    PT Goals (Current goals can be found  in the Care Plan section)  Acute Rehab PT Goals Patient Stated Goal: walk without pain PT Goal Formulation: With patient/family Time For Goal Achievement: 03/02/18 Potential to Achieve Goals: Good    Frequency 7X/week   Barriers to discharge        Co-evaluation               AM-PAC PT "6 Clicks" Daily Activity  Outcome Measure Difficulty turning over in bed (including adjusting bedclothes, sheets and blankets)?: A Little Difficulty moving from lying on back to sitting on the side of the bed? : A Little Difficulty sitting down on and standing up from a chair with arms (e.g., wheelchair, bedside commode, etc,.)?: A Little Help needed moving to and from a bed to chair (including a wheelchair)?: A Little Help needed walking in hospital room?: A Little Help needed climbing 3-5 steps with a railing? : A Lot 6 Click Score: 17    End of Session Equipment Utilized During Treatment: Gait belt Activity Tolerance: Patient tolerated treatment well Patient left: in chair;with call bell/phone within reach Nurse Communication: Mobility status PT Visit Diagnosis: Unsteadiness on feet (R26.81);Pain Pain - Right/Left: Right Pain - part of body: Knee    Time: 5176-1607 PT Time Calculation (min) (ACUTE ONLY): 32 min   Charges:   PT Evaluation $PT Eval Low Complexity: 1 Low PT Treatments $Gait Training: 8-22 mins   PT G Codes:        ][name] PT [phone]   [name] 02/28/2018, 1:04 PM

## 2018-02-28 NOTE — Progress Notes (Signed)
     Subjective: 1 Day Post-Op Procedure(s) (LRB): RIGHT TOTAL KNEE ARTHROPLASTY (Right)   Patient reports pain as mild, pain controlled.  No events throughout the night.  States that he feels very good and having minimal pain.  Looking forward to progressing with the new knee. Ready to be discharged home, if he does well with PT.    Patient's anticipated LOS is less than 2 midnights, meeting these requirements: - Lives within 1 hour of care - Has a competent adult at home to recover with post-op recover - NO history of  - Chronic pain requiring opiods  - Diabetes  - Coronary Artery Disease  - Heart failure  - Heart attack  - Stroke  - DVT/VTE  - Cardiac arrhythmia  - Respiratory Failure/COPD  - Anemia       Objective:   VITALS:   Vitals:   02/28/18 0156 02/28/18 0623  BP: 117/72 122/83  Pulse: 71 66  Resp: 16 16  Temp: 98.2 F (36.8 C) 98.1 F (36.7 C)  SpO2: 98% 98%    Dorsiflexion/Plantar flexion intact Incision: dressing C/D/I No cellulitis present Compartment soft  LABS Recent Labs    02/27/18 1619 02/28/18 0545  HGB 14.0 12.3*  HCT 43.5 38.1*  WBC 6.4 10.7*  PLT 121* 114*    Recent Labs    02/27/18 1619 02/28/18 0545  NA  --  140  K  --  5.0  BUN  --  30*  CREATININE 1.48* 1.66*  GLUCOSE  --  148*     Assessment/Plan: 1 Day Post-Op Procedure(s) (LRB): RIGHT TOTAL KNEE ARTHROPLASTY (Right) Foley cath d/c'ed Advance diet Up with therapy D/C IV fluids Discharge home Follow up in 2 weeks at [facility] [place] Follow up with OLIN,Deadra Diggins D in 2 weeks.  Contact information:  [name]) [address], [place] [phone]    Obese (BMI 30-39.9) Estimated body mass index is 31.58 kg/m as calculated from the following:   Height as of this encounter: 5\' 4"  (1.626 m).   Weight as of this encounter: 83.5 kg (184 lb). Patient also counseled  that weight may inhibit the healing process Patient counseled that losing weight will help with future health issues        West Pugh. Angelin Cutrone   PAC  02/28/2018, 8:42 AM

## 2018-02-28 NOTE — Progress Notes (Signed)
[place] for warfarin Indication:VTE prophylaxis  Allergies  Allergen Reactions  . Tramadol Itching    Patient Measurements: Height: 5\' 4"  (162.6 cm) Weight: 184 lb (83.5 kg) IBW/kg (Calculated) : 59.2  Vital Signs: Temp: 98.1 F (36.7 C) (06/19 0623) Temp Source: Oral (06/19 0623) BP: 122/83 (06/19 0623) Pulse Rate: 66 (06/19 0623)  Labs: Recent Labs    02/27/18 1137 02/27/18 1619 02/28/18 0545  HGB  --  14.0 12.3*  HCT  --  43.5 38.1*  PLT  --  121* 114*  APTT 30  --   --   LABPROT 15.8*  --  16.5*  INR 1.27  --  1.34  CREATININE  --  1.48* 1.66*    Estimated Creatinine Clearance: 31.7 mL/min (A) (by C-G formula based on SCr of 1.66 mg/dL (H)).   Medical History: Past Medical History:  Diagnosis Date  . A-fib ([place])   . Arthritis   . CAD (coronary artery disease)    GXT, neg bruce protocol GXT  . Chronic kidney disease    RENAL INSUFFICIENCY  . Diverticulosis   . ED (erectile dysfunction)   . GERD (gastroesophageal reflux disease)   . Glaucoma   . Gout    no flare in years   . Heart disorder   . Hypertension   . Obesity     Assessment: 84 yo male with hx of pain and functional disability in the rt knee presents for TKA.  Pt takes warfarin 2.5mg  daily for hx of AFib, last dose prior to surgery was 6/12.   Pharmacy consulted to dose warfarin.  02/28/2018 INR 1.34 H/H low Plts 114  Goal of Therapy:  INR 2-3   Plan:  Warfarin 3.5mg  po x 1 tonight Daily INR Enoxaparin bridge per MD, continue til INR therapeutic  Dolly Rias RPh 02/28/2018, 8:22 AM Pager [phone]

## 2018-02-28 NOTE — Progress Notes (Signed)
Physical Therapy Treatment Patient Details Name: [name]. MRN: [id number] DOB: 1931-06-12 Today's Date: 02/28/2018    History of Present Illness R TKA, to have L TKA in future    PT Comments    The patient  Ambulated x 200', practiced steps. Waiting to pass urine. Continue PT tomorrow  if not DC'd .  Follow Up Recommendations  Outpatient PT;Follow surgeon's recommendation for DC plan and follow-up therapies     Equipment Recommendations  None recommended by PT    Recommendations for Other Services       Precautions / Restrictions Precautions Precautions: Knee;Fall    Mobility  Bed Mobility Overal bed mobility: Needs Assistance Bed Mobility: Supine to Sit     Supine to sit: Supervision     General bed mobility comments: in recliner  Transfers Overall transfer level: Needs assistance Equipment used: Rolling walker (2 wheeled) Transfers: Sit to/from Stand Sit to Stand: Min assist         General transfer comment: cues for hand and  right leg placement  Ambulation/Gait Ambulation/Gait assistance: Min assist Gait Distance (Feet): 200 Feet Assistive device: Rolling walker (2 wheeled) Gait Pattern/deviations: Step-to pattern;Step-through pattern;Antalgic     General Gait Details: patient  states the  right leg feels a little  longer, left knoted varus knee   Stairs Stairs: Yes Stairs assistance: Min assist Stair Management: Step to pattern;Forwards;With cane;One rail Right Number of Stairs: 2 General stair comments: practiced x 2   Wheelchair Mobility    Modified Rankin (Stroke Patients Only)       Balance                                            Cognition Arousal/Alertness: Awake/alert Behavior During Therapy: WFL for tasks assessed/performed Overall Cognitive Status: Within Functional Limits for tasks assessed                                        Exercises Total Joint Exercises Ankle  Circles/Pumps: AROM;Both;10 reps Quad Sets: AROM;Both;10 reps     ong Arc Quad: AROM;Right;10 reps Knee Flexion: AROM;Right;10 reps    General Comments        Pertinent Vitals/Pain Pain Assessment: 0-10 Pain Score: 3  Pain Location: right  thigh Pain Descriptors / Indicators: Sore Pain Intervention(s): Monitored during session;Ice applied    Home Living Family/patient expects to be discharged to:: Private residence Living Arrangements: Children Available Help at Discharge: Family Type of Home: House Home Access: Stairs to enter Entrance Stairs-Rails: Right;Left Home Layout: One level Home Equipment: Environmental consultant - 2 wheels      Prior Function Level of Independence: Independent with assistive device(s)          PT Goals (current goals can now be found in the care plan section) Acute Rehab PT Goals Patient Stated Goal: walk without pain PT Goal Formulation: With patient/family Time For Goal Achievement: 03/02/18 Potential to Achieve Goals: Good Progress towards PT goals: Progressing toward goals    Frequency    7X/week      PT Plan Current plan remains appropriate    Co-evaluation              AM-PAC PT "6 Clicks" Daily Activity  Outcome Measure  Difficulty turning over in bed (including adjusting  bedclothes, sheets and blankets)?: A Little Difficulty moving from lying on back to sitting on the side of the bed? : A Little Difficulty sitting down on and standing up from a chair with arms (e.g., wheelchair, bedside commode, etc,.)?: A Little Help needed moving to and from a bed to chair (including a wheelchair)?: A Little Help needed walking in hospital room?: A Little Help needed climbing 3-5 steps with a railing? : A Little 6 Click Score: 18    End of Session Equipment Utilized During Treatment: Gait belt Activity Tolerance: Patient tolerated treatment well Patient left: in chair;with call bell/phone within reach;with family/visitor present Nurse  Communication: Mobility status PT Visit Diagnosis: Unsteadiness on feet (R26.81);Pain Pain - Right/Left: Right Pain - part of body: Knee     Time: 7681-1572 PT Time Calculation (min) (ACUTE ONLY): 34 min  Charges:  $Gait Training: 8-22 mins $Therapeutic Exercise: 8-22 mins                    G CodesTresa Endo PT [phone]  Claretha Cooper 02/28/2018, 3:00 PM

## 2018-03-01 LAB — CUP PACEART REMOTE DEVICE CHECK
Battery Remaining Longevity: 106 mo
Battery Remaining Percentage: 81 %
Battery Voltage: 2.93 V
Date Time Interrogation Session: 20190603183723
Implantable Lead Location: 753860
Implantable Pulse Generator Implant Date: 20110325
Lead Channel Pacing Threshold Amplitude: 1.25 V
Lead Channel Pacing Threshold Pulse Width: 0.8 ms
Lead Channel Setting Pacing Pulse Width: 0.8 ms
Lead Channel Setting Sensing Sensitivity: 2 mV
MDC IDC LEAD IMPLANT DT: 20040429
MDC IDC MSMT LEADCHNL RV IMPEDANCE VALUE: 440 Ohm
MDC IDC MSMT LEADCHNL RV SENSING INTR AMPL: 7.6 mV
MDC IDC PG SERIAL: 2313421
MDC IDC SET LEADCHNL RV PACING AMPLITUDE: 2.5 V
MDC IDC STAT BRADY RV PERCENT PACED: 11 %

## 2018-03-02 ENCOUNTER — Ambulatory Visit (INDEPENDENT_AMBULATORY_CARE_PROVIDER_SITE_OTHER): Payer: Medicare Other | Admitting: Pharmacist

## 2018-03-02 DIAGNOSIS — Z7901 Long term (current) use of anticoagulants: Secondary | ICD-10-CM

## 2018-03-02 DIAGNOSIS — M25661 Stiffness of right knee, not elsewhere classified: Secondary | ICD-10-CM | POA: Diagnosis not present

## 2018-03-02 DIAGNOSIS — M25561 Pain in right knee: Secondary | ICD-10-CM | POA: Diagnosis not present

## 2018-03-02 LAB — POCT INR: INR: 1.7 — AB (ref 2.0–3.0)

## 2018-03-02 NOTE — Patient Instructions (Signed)
Take 1 tablet today and last lovenox injection, then resume taking 1/2 tablet daily.  Repeat INR in 4 weeks.

## 2018-03-02 NOTE — Discharge Summary (Signed)
Physician Discharge Summary  Patient ID: [name]. MRN: [id number] DOB/AGE: Jul 01, 1931 84 y.o.  Admit date: 02/27/2018 Discharge date: 02/28/2018   Procedures:  Procedure(s) (LRB): RIGHT TOTAL KNEE ARTHROPLASTY (Right)  Attending Physician:  Dr. Paralee Cancel   Admission Diagnoses:   Right knee primary OA / pain  Discharge Diagnoses:  Principal Problem:   S/P right TKA Active Problems:   S/P total knee replacement  Past Medical History:  Diagnosis Date  . A-fib ([place])   . Arthritis   . CAD (coronary artery disease)    GXT, neg bruce protocol GXT  . Chronic kidney disease    RENAL INSUFFICIENCY  . Diverticulosis   . ED (erectile dysfunction)   . GERD (gastroesophageal reflux disease)   . Glaucoma   . Gout    no flare in years   . Heart disorder   . Hypertension   . Obesity     HPI:    [name]., 84 y.o. male, has a history of pain and functional disability in the right knee due to arthritis and has failed non-surgical conservative treatments for greater than 12 weeks to include NSAID's and/or analgesics, corticosteriod injections, use of assistive devices and activity modification.  Onset of symptoms was gradual, starting 5-6 years ago with gradually worsening course since that time. The patient noted no past surgery on the right knee(s).  Patient currently rates pain in the right knee at 8 out of 10 with activity. Patient has worsening of pain with activity and weight bearing, pain that interferes with activities of daily living, pain with passive range of motion, crepitus and joint swelling.  Patient has evidence of periarticular osteophytes and joint space narrowing by imaging studies. There is no active infection.  Risks, benefits and expectations were discussed with the patient.  Risks including but not limited to the risk of anesthesia, blood clots, nerve damage, blood vessel damage, failure of the prosthesis, infection and up to and including death.   Patient understand the risks, benefits and expectations and wishes to proceed with surgery.   PCP: [name], MD   Discharged Condition: good  Hospital Course:  Patient underwent the above stated procedure on 02/27/2018. Patient tolerated the procedure well and brought to the recovery room in good condition and subsequently to the floor.  POD #1 BP: 122/83 ; Pulse: 66 ; Temp: 98.1 F (36.7 C) ; Resp: 16 Patient reports pain as mild, pain controlled.  No events throughout the night.  States that he feels very good and having minimal pain.  Looking forward to progressing with the new knee. Ready to be discharged home. Dorsiflexion/plantar flexion intact, incision: dressing C/D/I, no cellulitis present and compartment soft.   LABS  Basename    HGB     12.3  HCT     38.1    Discharge Exam: General appearance: alert, cooperative and no distress Extremities: Homans sign is negative, no sign of DVT, no edema, redness or tenderness in the calves or thighs and no ulcers, gangrene or trophic changes  Disposition:  Home with follow up in 2 weeks   Follow-up Information    Paralee Cancel, MD. Schedule an appointment as soon as possible for a visit in 2 weeks.   Specialty:  Orthopedic Surgery Contact information: [address] [place] [address] [phone]           Discharge Instructions    Call MD / Call 911   Complete by:  As directed    If you experience chest pain or shortness of breath, CALL 911 and be transported to the hospital emergency room.  If you develope a fever above 101 F, pus (white drainage) or increased drainage or redness at the wound, or calf pain, call your surgeon's office.   Change dressing   Complete by:  As directed    Maintain surgical dressing until follow up in the clinic. If the edges start to pull up, may reinforce with tape. If the dressing is no longer working, may remove and cover with gauze and tape, but must keep the area  dry and clean.  Call with any questions or concerns.   Constipation Prevention   Complete by:  As directed    Drink plenty of fluids.  Prune juice may be helpful.  You may use a stool softener, such as Colace (over the counter) 100 mg twice a day.  Use MiraLax (over the counter) for constipation as needed.   Diet - low sodium heart healthy   Complete by:  As directed    Discharge instructions   Complete by:  As directed    Maintain surgical dressing until follow up in the clinic. If the edges start to pull up, may reinforce with tape. If the dressing is no longer working, may remove and cover with gauze and tape, but must keep the area dry and clean.  Follow up in 2 weeks at [facility]. Call with any questions or concerns.   Increase activity slowly as tolerated   Complete by:  As directed    Weight bearing as tolerated with assist device (walker, cane, etc) as directed, use it as long as suggested by your surgeon or therapist, typically at least 4-6 weeks.   TED hose   Complete by:  As directed    Use stockings (TED hose) for 2 weeks on both leg(s).  You may remove them at night for sleeping.      Allergies as of 02/28/2018      Reactions   Tramadol Itching      Medication List    STOP taking these medications   acetaminophen 500 MG tablet Commonly known as:  TYLENOL     TAKE these medications   benazepril-hydrochlorthiazide 20-12.5 MG tablet Commonly known as:  LOTENSIN HCT TAKE 1 TABLET BY MOUTH  DAILY   carvedilol 3.125 MG tablet Commonly known as:  COREG TAKE 1 TABLET BY MOUTH TWO  TIMES DAILY   docusate sodium 100 MG capsule Commonly known as:  COLACE Take 1 capsule (100 mg total) by mouth 2 (two) times daily.   doxazosin 4 MG tablet Commonly known as:  CARDURA TAKE 1 TABLET BY MOUTH AT  BEDTIME   enoxaparin 40 MG/0.4ML injection Commonly known as:  LOVENOX Inject 0.4 mLs (40 mg total) into the skin daily. Use along with Coumadin to obtain  therapeutic INR. May stop Lovenox once INR >/= 1.8.   ferrous sulfate 325 (65 FE) MG tablet Commonly known as:  FERROUSUL Take 1 tablet (325 mg total) by mouth 3 (three) times daily with meals.   HYDROcodone-acetaminophen 7.5-325 MG tablet Commonly known as:  NORCO Take 1-2 tablets by mouth every 4 (four) hours as needed for moderate pain.   latanoprost 0.005 % ophthalmic solution Commonly known as:  XALATAN Place 1 drop into both eyes at bedtime.   methocarbamol 500 MG tablet Commonly known as:  ROBAXIN Take 1 tablet (500 mg total) by mouth every 6 (six) hours  as needed for muscle spasms.   polyethylene glycol packet Commonly known as:  MIRALAX / GLYCOLAX Take 17 g by mouth 2 (two) times daily.   potassium chloride SA 20 MEQ tablet Commonly known as:  K-DUR,KLOR-CON TAKE ONE-HALF TABLET BY  MOUTH DAILY   PRESERVISION AREDS 2 PO Take 2 capsules by mouth daily. I-CAPS   simvastatin 20 MG tablet Commonly known as:  ZOCOR Take 1 tablet (20 mg total) by mouth at bedtime.   Vitamin D3 1000 units Caps Take 1,000 Units by mouth daily.   warfarin 5 MG tablet Commonly known as:  COUMADIN Take as directed. If you are unsure how to take this medication, talk to your nurse or doctor. Original instructions:  TAKE 1/2 TABLET DAILY OR AS DIRECTED BY COUMADIN CLINIC            Discharge Care Instructions  (From admission, onward)        Start     Ordered   02/28/18 0000  Change dressing    Comments:  Maintain surgical dressing until follow up in the clinic. If the edges start to pull up, may reinforce with tape. If the dressing is no longer working, may remove and cover with gauze and tape, but must keep the area dry and clean.  Call with any questions or concerns.   02/28/18 0845       Signed: West Pugh. Juliano Mceachin   PA-C  03/02/2018, 3:39 PM

## 2018-03-05 DIAGNOSIS — M25561 Pain in right knee: Secondary | ICD-10-CM | POA: Diagnosis not present

## 2018-03-05 DIAGNOSIS — M25661 Stiffness of right knee, not elsewhere classified: Secondary | ICD-10-CM | POA: Diagnosis not present

## 2018-03-07 DIAGNOSIS — M25561 Pain in right knee: Secondary | ICD-10-CM | POA: Diagnosis not present

## 2018-03-07 DIAGNOSIS — M25661 Stiffness of right knee, not elsewhere classified: Secondary | ICD-10-CM | POA: Diagnosis not present

## 2018-03-09 DIAGNOSIS — M1711 Unilateral primary osteoarthritis, right knee: Secondary | ICD-10-CM | POA: Diagnosis not present

## 2018-03-12 DIAGNOSIS — M25661 Stiffness of right knee, not elsewhere classified: Secondary | ICD-10-CM | POA: Diagnosis not present

## 2018-03-12 DIAGNOSIS — M25561 Pain in right knee: Secondary | ICD-10-CM | POA: Diagnosis not present

## 2018-03-14 DIAGNOSIS — M1711 Unilateral primary osteoarthritis, right knee: Secondary | ICD-10-CM | POA: Diagnosis not present

## 2018-03-16 DIAGNOSIS — M1711 Unilateral primary osteoarthritis, right knee: Secondary | ICD-10-CM | POA: Diagnosis not present

## 2018-03-19 DIAGNOSIS — M25661 Stiffness of right knee, not elsewhere classified: Secondary | ICD-10-CM | POA: Diagnosis not present

## 2018-03-19 DIAGNOSIS — M25561 Pain in right knee: Secondary | ICD-10-CM | POA: Diagnosis not present

## 2018-03-21 DIAGNOSIS — M1711 Unilateral primary osteoarthritis, right knee: Secondary | ICD-10-CM | POA: Diagnosis not present

## 2018-04-02 ENCOUNTER — Ambulatory Visit (INDEPENDENT_AMBULATORY_CARE_PROVIDER_SITE_OTHER): Payer: Medicare Other | Admitting: Pharmacist

## 2018-04-02 DIAGNOSIS — M1711 Unilateral primary osteoarthritis, right knee: Secondary | ICD-10-CM | POA: Diagnosis not present

## 2018-04-02 DIAGNOSIS — Z7901 Long term (current) use of anticoagulants: Secondary | ICD-10-CM

## 2018-04-02 LAB — POCT INR: INR: 2.3 (ref 2.0–3.0)

## 2018-04-03 ENCOUNTER — Ambulatory Visit (INDEPENDENT_AMBULATORY_CARE_PROVIDER_SITE_OTHER): Payer: Medicare Other | Admitting: Family Medicine

## 2018-04-03 ENCOUNTER — Encounter: Payer: Self-pay | Admitting: Family Medicine

## 2018-04-03 VITALS — BP 118/70 | HR 64 | Temp 97.6°F | Wt 182.2 lb

## 2018-04-03 DIAGNOSIS — Z96651 Presence of right artificial knee joint: Secondary | ICD-10-CM | POA: Diagnosis not present

## 2018-04-03 DIAGNOSIS — D649 Anemia, unspecified: Secondary | ICD-10-CM | POA: Diagnosis not present

## 2018-04-03 DIAGNOSIS — E878 Other disorders of electrolyte and fluid balance, not elsewhere classified: Secondary | ICD-10-CM | POA: Diagnosis not present

## 2018-04-03 NOTE — Progress Notes (Signed)
   Subjective:    Patient ID: [name]., male    DOB: 12-31-1930, 84 y.o.   MRN: [id number]  HPI  he is here for follow-up visit after recent right TKR.  He was admitted on June 18 and sent home on the 19th.  He is being cared for at home.  His daughter is doing the care.  He is also being taken to physical therapy.  He is now taking Tylenol.  His BMs are normal.  He is not using the doctors anymore.  He continues on medications listed in the chart and they have not changed.  He recently had a Coumadin check which was slightly low.  He has no other concerns or complaints. Blood work did show some minor abnormalities including elevated blood sugar.  Also his hemoglobin was slightly below the range of normal.  Review of Systems     Objective:   Physical Exam Alert and in no distress.  Right knee exam does show the incision to be healing nicely with some continued healing distally.  There is some scarring down on the underlying tissues.  He does not have full motion but does get 110 degrees of flexion.       Assessment & Plan:  Status post total right knee replacement  Disorder of electrolytes - Plan: Comprehensive metabolic panel  Low hemoglobin - Plan: CBC with Differential/Platelet I stressed the need for him to continue with physical therapy and range of motion especially with full extension.  Discussed how to fire the quads especially the VMO to help with his full extension.  Also discussed scar revision with him. Of note is the fact that he ate roughly an hour and a half ago.

## 2018-04-04 DIAGNOSIS — M1711 Unilateral primary osteoarthritis, right knee: Secondary | ICD-10-CM | POA: Diagnosis not present

## 2018-04-04 LAB — COMPREHENSIVE METABOLIC PANEL
ALT: 7 IU/L (ref 0–44)
AST: 16 IU/L (ref 0–40)
Albumin/Globulin Ratio: 1.8 (ref 1.2–2.2)
Albumin: 4.2 g/dL (ref 3.5–4.7)
Alkaline Phosphatase: 66 IU/L (ref 39–117)
BUN / CREAT RATIO: 15 (ref 10–24)
BUN: 23 mg/dL (ref 8–27)
Bilirubin Total: 0.7 mg/dL (ref 0.0–1.2)
CALCIUM: 10 mg/dL (ref 8.6–10.2)
CO2: 24 mmol/L (ref 20–29)
Chloride: 103 mmol/L (ref 96–106)
Creatinine, Ser: 1.53 mg/dL — ABNORMAL HIGH (ref 0.76–1.27)
GFR calc Af Amer: 47 mL/min/{1.73_m2} — ABNORMAL LOW (ref >59)
GFR, EST NON AFRICAN AMERICAN: 41 mL/min/{1.73_m2} — AB (ref >59)
GLOBULIN, TOTAL: 2.4 g/dL (ref 1.5–4.5)
Glucose: 120 mg/dL — ABNORMAL HIGH (ref 65–99)
Potassium: 4.5 mmol/L (ref 3.5–5.2)
Sodium: 142 mmol/L (ref 134–144)
Total Protein: 6.6 g/dL (ref 6.0–8.5)

## 2018-04-04 LAB — CBC WITH DIFFERENTIAL/PLATELET
Basophils Absolute: 0.1 10*3/uL (ref 0.0–0.2)
Basos: 1 %
EOS (ABSOLUTE): 0.3 10*3/uL (ref 0.0–0.4)
EOS: 6 %
Hematocrit: 39.8 % (ref 37.5–51.0)
Hemoglobin: 12.2 g/dL — ABNORMAL LOW (ref 13.0–17.7)
IMMATURE GRANS (ABS): 0 10*3/uL (ref 0.0–0.1)
Immature Granulocytes: 0 %
Lymphocytes Absolute: 0.9 10*3/uL (ref 0.7–3.1)
Lymphs: 18 %
MCH: 29.3 pg (ref 26.6–33.0)
MCHC: 30.7 g/dL — ABNORMAL LOW (ref 31.5–35.7)
MCV: 96 fL (ref 79–97)
Monocytes Absolute: 0.5 10*3/uL (ref 0.1–0.9)
Monocytes: 9 %
NEUTROS PCT: 66 %
Neutrophils Absolute: 3.4 10*3/uL (ref 1.4–7.0)
Platelets: 172 10*3/uL (ref 150–450)
RBC: 4.16 x10E6/uL (ref 4.14–5.80)
RDW: 14.4 % (ref 12.3–15.4)
WBC: 5.2 10*3/uL (ref 3.4–10.8)

## 2018-04-09 DIAGNOSIS — M25561 Pain in right knee: Secondary | ICD-10-CM | POA: Diagnosis not present

## 2018-04-09 DIAGNOSIS — M25661 Stiffness of right knee, not elsewhere classified: Secondary | ICD-10-CM | POA: Diagnosis not present

## 2018-04-12 DIAGNOSIS — M1711 Unilateral primary osteoarthritis, right knee: Secondary | ICD-10-CM | POA: Diagnosis not present

## 2018-04-12 DIAGNOSIS — M25562 Pain in left knee: Secondary | ICD-10-CM | POA: Diagnosis not present

## 2018-04-16 DIAGNOSIS — M1711 Unilateral primary osteoarthritis, right knee: Secondary | ICD-10-CM | POA: Diagnosis not present

## 2018-04-18 DIAGNOSIS — M1711 Unilateral primary osteoarthritis, right knee: Secondary | ICD-10-CM | POA: Diagnosis not present

## 2018-04-23 DIAGNOSIS — M25661 Stiffness of right knee, not elsewhere classified: Secondary | ICD-10-CM | POA: Diagnosis not present

## 2018-04-23 DIAGNOSIS — M25561 Pain in right knee: Secondary | ICD-10-CM | POA: Diagnosis not present

## 2018-04-25 DIAGNOSIS — M1711 Unilateral primary osteoarthritis, right knee: Secondary | ICD-10-CM | POA: Diagnosis not present

## 2018-05-15 ENCOUNTER — Ambulatory Visit (INDEPENDENT_AMBULATORY_CARE_PROVIDER_SITE_OTHER): Payer: Medicare Other | Admitting: *Deleted

## 2018-05-15 DIAGNOSIS — R55 Syncope and collapse: Secondary | ICD-10-CM | POA: Diagnosis not present

## 2018-05-15 DIAGNOSIS — I4891 Unspecified atrial fibrillation: Secondary | ICD-10-CM

## 2018-05-15 NOTE — Progress Notes (Signed)
Remote pacemaker transmission.   

## 2018-05-16 ENCOUNTER — Ambulatory Visit (INDEPENDENT_AMBULATORY_CARE_PROVIDER_SITE_OTHER): Payer: Medicare Other | Admitting: Pharmacist

## 2018-05-16 DIAGNOSIS — Z7901 Long term (current) use of anticoagulants: Secondary | ICD-10-CM | POA: Diagnosis not present

## 2018-05-16 LAB — POCT INR: INR: 2.3 (ref 2.0–3.0)

## 2018-05-20 ENCOUNTER — Other Ambulatory Visit: Payer: Self-pay | Admitting: Family Medicine

## 2018-06-08 ENCOUNTER — Other Ambulatory Visit (INDEPENDENT_AMBULATORY_CARE_PROVIDER_SITE_OTHER): Payer: Medicare Other

## 2018-06-08 DIAGNOSIS — Z23 Encounter for immunization: Secondary | ICD-10-CM

## 2018-06-11 LAB — CUP PACEART REMOTE DEVICE CHECK
Battery Remaining Longevity: 118 mo
Brady Statistic RV Percent Paced: 11 %
Date Time Interrogation Session: 20190903132505
Implantable Lead Location: 753860
Lead Channel Pacing Threshold Pulse Width: 0.8 ms
Lead Channel Sensing Intrinsic Amplitude: 4.6 mV
Lead Channel Setting Pacing Amplitude: 2.5 V
Lead Channel Setting Pacing Pulse Width: 0.8 ms
Lead Channel Setting Sensing Sensitivity: 2 mV
MDC IDC LEAD IMPLANT DT: 20040429
MDC IDC MSMT BATTERY REMAINING PERCENTAGE: 91 %
MDC IDC MSMT BATTERY VOLTAGE: 2.95 V
MDC IDC MSMT LEADCHNL RV IMPEDANCE VALUE: 430 Ohm
MDC IDC MSMT LEADCHNL RV PACING THRESHOLD AMPLITUDE: 1.25 V
MDC IDC PG IMPLANT DT: 20110325
Pulse Gen Serial Number: 2313421

## 2018-06-27 ENCOUNTER — Ambulatory Visit (INDEPENDENT_AMBULATORY_CARE_PROVIDER_SITE_OTHER): Payer: Medicare Other | Admitting: Pharmacist

## 2018-06-27 DIAGNOSIS — Z7901 Long term (current) use of anticoagulants: Secondary | ICD-10-CM | POA: Diagnosis not present

## 2018-06-27 LAB — POCT INR: INR: 2.3 (ref 2.0–3.0)

## 2018-07-12 DIAGNOSIS — H26493 Other secondary cataract, bilateral: Secondary | ICD-10-CM | POA: Diagnosis not present

## 2018-07-12 DIAGNOSIS — H524 Presbyopia: Secondary | ICD-10-CM | POA: Diagnosis not present

## 2018-07-12 DIAGNOSIS — H401131 Primary open-angle glaucoma, bilateral, mild stage: Secondary | ICD-10-CM | POA: Diagnosis not present

## 2018-07-23 ENCOUNTER — Other Ambulatory Visit: Payer: Self-pay | Admitting: Internal Medicine

## 2018-07-23 ENCOUNTER — Other Ambulatory Visit: Payer: Self-pay | Admitting: Family Medicine

## 2018-08-02 DIAGNOSIS — H26492 Other secondary cataract, left eye: Secondary | ICD-10-CM | POA: Diagnosis not present

## 2018-08-07 ENCOUNTER — Ambulatory Visit (INDEPENDENT_AMBULATORY_CARE_PROVIDER_SITE_OTHER): Payer: Medicare Other | Admitting: Family Medicine

## 2018-08-07 VITALS — BP 136/80 | HR 62 | Temp 97.6°F | Wt 187.0 lb

## 2018-08-07 DIAGNOSIS — L03032 Cellulitis of left toe: Secondary | ICD-10-CM | POA: Diagnosis not present

## 2018-08-07 DIAGNOSIS — Z7901 Long term (current) use of anticoagulants: Secondary | ICD-10-CM

## 2018-08-07 DIAGNOSIS — I4891 Unspecified atrial fibrillation: Secondary | ICD-10-CM

## 2018-08-07 MED ORDER — DOXYCYCLINE HYCLATE 100 MG PO TABS: 100.0000 mg | ORAL_TABLET | Freq: Two times a day (BID) | ORAL | 0 refills | Status: DC

## 2018-08-07 NOTE — Progress Notes (Signed)
   Subjective:    Patient ID: [name]., male    DOB: 1931-07-18, 84 y.o.   MRN: [id number]  HPI He is here for evaluation of left great toe pain he has had for the last several days.  He complains that it is red, swollen and minimally tender.  He apparently does have a previous history of gout.  He is planning on having left TKR in December.  He had a right TKR several months ago and is doing quite nicely with that.  He continues on Coumadin.  Has a scheduled appointment to see cardiology in mid December.   Review of Systems     Objective:   Physical Exam Alert and in no distress.  Cardiac exam shows an irregular rhythm.  Lungs are clear to auscultation.  Exam of the left great toe does show some erythema over the dorsal surface of the DIP joint but no tenderness palpation to the joint.  It is not hot.  Only minimally tender.       Assessment & Plan:  Atrial fibrillation, unspecified type (HCC)  Cellulitis of toe of left foot - Plan: doxycycline (VIBRA-TABS) 100 MG tablet  Long term (current) use of anticoagulants I will place him on doxycycline to help with the cellulitis as I do not think this is gout related. I see no problem with him having surgery.  He will follow-up with cardiology in December.

## 2018-08-08 ENCOUNTER — Ambulatory Visit (INDEPENDENT_AMBULATORY_CARE_PROVIDER_SITE_OTHER): Payer: Medicare Other | Admitting: Pharmacist Clinician (PhC)/ Clinical Pharmacy Specialist

## 2018-08-08 DIAGNOSIS — Z7901 Long term (current) use of anticoagulants: Secondary | ICD-10-CM | POA: Diagnosis not present

## 2018-08-08 LAB — POCT INR: INR: 2.4 (ref 2.0–3.0)

## 2018-08-08 NOTE — H&P (Signed)
TOTAL KNEE ADMISSION H&P  Patient is being admitted for left total knee arthroplasty.  Subjective:  Chief Complaint:    Left knee primary OA / pain  HPI: [name]., 84 y.o. male, has a history of pain and functional disability in the left knee due to arthritis and has failed non-surgical conservative treatments for greater than 12 weeks to includeNSAID's and/or analgesics, corticosteriod injections, viscosupplementation injections, use of assistive devices and activity modification.  Onset of symptoms was gradual, starting 2+ years ago with gradually worsening course since that time. The patient noted prior procedures on the knee to include  arthroplasty on the right knee per Dr. Alvan Dame in June 2019.  Patient currently rates pain in the left knee(s) at 7 out of 10 with activity. Patient has worsening of pain with activity and weight bearing, pain that interferes with activities of daily living, pain with passive range of motion, crepitus and joint swelling.  Patient has evidence of periarticular osteophytes and joint space narrowing by imaging studies.  There is no active infection.  Risks, benefits and expectations were discussed with the patient.  Risks including but not limited to the risk of anesthesia, blood clots, nerve damage, blood vessel damage, failure of the prosthesis, infection and up to and including death.  Patient understand the risks, benefits and expectations and wishes to proceed with surgery.   PCP: [name], MD  D/C Plans:       Home   Post-op Meds:       No Rx given   Tranexamic Acid:      To be given - IV   Decadron:      Is to be given  FYI:      Lovenox and Coumadin (has an appointment post-op to check Coumadin)  Norco  DME:   Pt already has equipment   PT:    OPPT Rx given   Patient Active Problem List   Diagnosis Date Noted  . S/P right TKA 02/27/2018  . S/P total knee replacement 02/27/2018  . Obesity   . GERD (gastroesophageal reflux  disease)   . ED (erectile dysfunction)   . Diverticulosis   . CAD (coronary artery disease)   . A-fib ([place])   . CKD (chronic kidney disease) stage 3, GFR 30-59 ml/min (HCC) 01/13/2017  . Atherosclerosis 12/12/2016  . Arthritis 12/01/2014  . Abnormality of gait 02/05/2013  . Long term (current) use of anticoagulants 12/29/2012  . Glaucoma 07/26/2011  . Hyperlipidemia LDL goal <70 07/26/2011  . Hypertension 07/26/2011  . BRADYCARDIA 12/26/2008  . PACEMAKER-[place] 12/26/2008   Past Medical History:  Diagnosis Date  . A-fib ([place])   . Arthritis   . CAD (coronary artery disease)    GXT, neg bruce protocol GXT  . Chronic kidney disease    RENAL INSUFFICIENCY  . Diverticulosis   . ED (erectile dysfunction)   . GERD (gastroesophageal reflux disease)   . Glaucoma   . Gout    no flare in years   . Heart disorder   . Hypertension   . Obesity     Past Surgical History:  Procedure Laterality Date  . CATARACT EXTRACTION, BILATERAL  2018   with lens placement   . PACEMAKER INSERTION  01/09/03  . TONSILLECTOMY    . TOTAL KNEE ARTHROPLASTY Right 02/27/2018   Procedure: RIGHT TOTAL KNEE ARTHROPLASTY;  Surgeon: Paralee Cancel, MD;  Location: [facility]S;  Service: Orthopedics;  Laterality: Right;  70 mins    No current  facility-administered medications for this encounter.    Current Outpatient Medications  Medication Sig Dispense Refill Last Dose  . benazepril-hydrochlorthiazide (LOTENSIN HCT) 20-12.5 MG tablet TAKE 1 TABLET BY MOUTH  DAILY 90 tablet 3 Taking  . carvedilol (COREG) 3.125 MG tablet TAKE 1 TABLET BY MOUTH TWO  TIMES DAILY 180 tablet 3 Taking  . Cholecalciferol (VITAMIN D3) 1000 units CAPS Take 1,000 Units by mouth daily.   Taking  . docusate sodium (COLACE) 100 MG capsule Take 1 capsule (100 mg total) by mouth 2 (two) times daily. (Patient not taking: Reported on 08/07/2018) 10 capsule 0 Not Taking  . doxazosin (CARDURA) 4 MG tablet TAKE 1 TABLET BY MOUTH AT  BEDTIME 90 tablet 3  Taking  . doxycycline (VIBRA-TABS) 100 MG tablet Take 1 tablet (100 mg total) by mouth 2 (two) times daily. 28 tablet 0   . enoxaparin (LOVENOX) 40 MG/0.4ML injection Inject 0.4 mLs (40 mg total) into the skin daily. Use along with Coumadin to obtain therapeutic INR. May stop Lovenox once INR >/= 1.8. (Patient not taking: Reported on 04/03/2018) 14 Syringe 0 Not Taking  . ferrous sulfate (FERROUSUL) 325 (65 FE) MG tablet Take 1 tablet (325 mg total) by mouth 3 (three) times daily with meals. (Patient not taking: Reported on 04/03/2018)  3 Not Taking  . HYDROcodone-acetaminophen (NORCO) 7.5-325 MG tablet Take 1-2 tablets by mouth every 4 (four) hours as needed for moderate pain. (Patient not taking: Reported on 08/07/2018) 60 tablet 0 Not Taking  . latanoprost (XALATAN) 0.005 % ophthalmic solution Place 1 drop into both eyes at bedtime.   3 Taking  . methocarbamol (ROBAXIN) 500 MG tablet Take 1 tablet (500 mg total) by mouth every 6 (six) hours as needed for muscle spasms. (Patient not taking: Reported on 04/03/2018) 40 tablet 0 Not Taking  . Multiple Vitamins-Minerals (PRESERVISION AREDS 2 PO) Take 2 capsules by mouth daily. I-CAPS   Taking  . polyethylene glycol (MIRALAX / GLYCOLAX) packet Take 17 g by mouth 2 (two) times daily. (Patient not taking: Reported on 04/03/2018) 14 each 0 Not Taking  . potassium chloride SA (K-DUR,KLOR-CON) 20 MEQ tablet TAKE ONE-HALF TABLET BY  MOUTH DAILY 45 tablet 0 Taking  . simvastatin (ZOCOR) 20 MG tablet Take 1 tablet (20 mg total) by mouth at bedtime. 90 tablet 3 Taking  . simvastatin (ZOCOR) 20 MG tablet TAKE 1 TABLET BY MOUTH AT  BEDTIME 90 tablet 0   . warfarin (COUMADIN) 5 MG tablet TAKE 1/2 TABLET DAILY OR AS DIRECTED BY COUMADIN CLINIC 45 tablet 1 Taking   Facility-Administered Medications Ordered in Other Encounters  Medication Dose Route Frequency Provider Last Rate Last Dose  . influenza  inactive virus vaccine (FLUZONE/FLUARIX) injection 0.5 mL  0.5 mL  Intramuscular Once Rita Ohara, MD       Allergies  Allergen Reactions  . Tramadol Itching    Social History   Tobacco Use  . Smoking status: Former Smoker    Last attempt to quit: 09/13/1979    Years since quitting: 38.9  . Smokeless tobacco: Never Used  Substance Use Topics  . Alcohol use: Yes    Comment: Consumes 2-3 glasses of wine per week; now only socially     Family History  Problem Relation Age of Onset  . Heart disease Mother   . Hypertension Mother   . Kidney disease Mother   . Stroke Mother   . Heart disease Father      Review of Systems  Constitutional: Negative.  HENT: Negative.   Eyes: Negative.   Respiratory: Negative.   Cardiovascular: Negative.   Gastrointestinal: Positive for heartburn.  Genitourinary: Negative.   Musculoskeletal: Positive for joint pain.  Skin: Negative.   Neurological: Negative.   Endo/Heme/Allergies: Negative.   Psychiatric/Behavioral: Negative.     Objective:  Physical Exam  Constitutional: He is oriented to person, place, and time. He appears well-developed.  HENT:  Head: Normocephalic.  Eyes: Pupils are equal, round, and reactive to light.  Neck: Neck supple. No JVD present. No tracheal deviation present. No thyromegaly present.  Cardiovascular: Normal rate, regular rhythm and intact distal pulses.  Respiratory: Effort normal and breath sounds normal. No respiratory distress. He has no wheezes.  GI: Soft. There is no tenderness. There is no guarding.  Musculoskeletal:       Left knee: He exhibits decreased range of motion, swelling and bony tenderness. He exhibits no ecchymosis, no deformity, no laceration and no erythema. Tenderness found.  Lymphadenopathy:    He has no cervical adenopathy.  Neurological: He is alert and oriented to person, place, and time.  Skin: Skin is warm and dry.  Psychiatric: He has a normal mood and affect.      Labs:  Estimated body mass index is 32.1 kg/m as calculated from the  following:   Height as of 02/27/18: 5\' 4"  (1.626 m).   Weight as of 08/07/18: 84.8 kg.   Imaging Review Plain radiographs demonstrate severe degenerative joint disease of the left knee.  The bone quality appears to be good for age and reported activity level.   Preoperative templating of the joint replacement has been completed, documented, and submitted to the Operating Room personnel in order to optimize intra-operative equipment management.   Anticipated LOS equal to or greater than 2 midnights due to - Age 26 and older with one or more of the following:  - Obesity  - Expected need for hospital services (PT, OT, Nursing) required for safe  discharge  - Anticipated need for postoperative skilled nursing care or inpatient rehab  - Active co-morbidities: Coronary Artery Disease, Cardiac Arrhythmia and CKD   Assessment/Plan:  End stage arthritis, left knee   The patient history, physical examination, clinical judgment of the provider and imaging studies are consistent with end stage degenerative joint disease of the left knee(s) and total knee arthroplasty is deemed medically necessary. The treatment options including medical management, injection therapy arthroscopy and arthroplasty were discussed at length. The risks and benefits of total knee arthroplasty were presented and reviewed. The risks due to aseptic loosening, infection, stiffness, patella tracking problems, thromboembolic complications and other imponderables were discussed. The patient acknowledged the explanation, agreed to proceed with the plan and consent was signed. Patient is being admitted for inpatient treatment for surgery, pain control, PT, OT, prophylactic antibiotics, VTE prophylaxis, progressive ambulation and ADL's and discharge planning. The patient is planning to be discharged home.      West Pugh Tenicia Gural   PA-C  08/08/2018, 12:14 PM

## 2018-08-08 NOTE — Patient Instructions (Signed)
Description   Continue taking 1/2 tablet daily.  Repeat INR in 6 weeks. *Surgery on Dec/26*  Call if you need an earlier appointment after surgery [phone] (Kristin/Raquel)

## 2018-08-13 ENCOUNTER — Encounter: Payer: Medicare Other | Admitting: Family Medicine

## 2018-08-14 ENCOUNTER — Telehealth: Payer: Self-pay

## 2018-08-14 ENCOUNTER — Ambulatory Visit (INDEPENDENT_AMBULATORY_CARE_PROVIDER_SITE_OTHER): Payer: Medicare Other

## 2018-08-14 DIAGNOSIS — R55 Syncope and collapse: Secondary | ICD-10-CM | POA: Diagnosis not present

## 2018-08-14 DIAGNOSIS — I4891 Unspecified atrial fibrillation: Secondary | ICD-10-CM

## 2018-08-14 NOTE — Telephone Encounter (Signed)
LMOVM reminding pt to send remote transmission.   

## 2018-08-15 NOTE — Progress Notes (Signed)
Remote pacemaker transmission.   

## 2018-08-16 ENCOUNTER — Other Ambulatory Visit: Payer: Self-pay | Admitting: Internal Medicine

## 2018-08-16 DIAGNOSIS — H26491 Other secondary cataract, right eye: Secondary | ICD-10-CM | POA: Diagnosis not present

## 2018-08-16 MED ORDER — DOXAZOSIN MESYLATE 4 MG PO TABS: 4.0000 mg | ORAL_TABLET | Freq: Every day | ORAL | 0 refills | Status: DC

## 2018-08-23 ENCOUNTER — Ambulatory Visit (INDEPENDENT_AMBULATORY_CARE_PROVIDER_SITE_OTHER): Payer: Medicare Other | Admitting: Family Medicine

## 2018-08-23 ENCOUNTER — Encounter: Payer: Self-pay | Admitting: Family Medicine

## 2018-08-23 ENCOUNTER — Ambulatory Visit
Admission: RE | Admit: 2018-08-23 | Discharge: 2018-08-23 | Disposition: A | Discharge status: Home / Self Care | Payer: Medicare Other | Source: Ambulatory Visit | Attending: Family Medicine | Admitting: Family Medicine

## 2018-08-23 VITALS — BP 128/80 | HR 86 | Temp 98.0°F | Wt 189.0 lb

## 2018-08-23 DIAGNOSIS — M79672 Pain in left foot: Secondary | ICD-10-CM

## 2018-08-23 DIAGNOSIS — M19072 Primary osteoarthritis, left ankle and foot: Secondary | ICD-10-CM | POA: Diagnosis not present

## 2018-08-23 MED ORDER — COLCHICINE 0.6 MG PO TABS: 0.6000 mg | ORAL_TABLET | Freq: Two times a day (BID) | ORAL | 0 refills | Status: DC

## 2018-08-23 NOTE — Progress Notes (Signed)
   Subjective:    Patient ID: [name]., male    DOB: March 26, 1931, 84 y.o.   MRN: [id number]  HPI He is here for consult concerning left foot pain.  He states that he woke up Tuesday complaining of ankle discomfort.  No history of injury, fever, chills, redness or swelling.  He did try some Tylenol.  He is on Coumadin and does have surgery scheduled for right after Christmas.  He states that the recent skin infection has cleared up and he is having no difficulty with his toe.   Review of Systems     Objective:   Physical Exam Alert and in no distress.  Medial and lateral swelling of the ankle is noted with pain on motion of the ankle.  No tenderness over the tarsal or metatarsal joints.  Skin appears normal.  No swelling noted of the knees or other ankle.       Assessment & Plan:  Left foot pain - Plan: colchicine 0.6 MG tablet, DG Foot Complete Left Difficult situation to take care of especially since he is on Coumadin.  He cannot take tramadol or NSAIDs..  I will check an x-ray and see if colchicine will help quiet things down.  May consider steroids but would like to avoid that as well

## 2018-08-24 ENCOUNTER — Other Ambulatory Visit: Payer: Self-pay | Admitting: Pharmacist

## 2018-08-24 ENCOUNTER — Ambulatory Visit: Payer: Medicare Other | Admitting: Family Medicine

## 2018-08-24 MED ORDER — WARFARIN SODIUM 5 MG PO TABS: ORAL_TABLET | ORAL | 1 refills | Status: DC

## 2018-08-27 ENCOUNTER — Telehealth: Payer: Self-pay

## 2018-08-27 NOTE — Telephone Encounter (Signed)
Pt says he is doing well on the med. [place]

## 2018-08-28 ENCOUNTER — Ambulatory Visit: Payer: Medicare Other | Admitting: Internal Medicine

## 2018-08-28 ENCOUNTER — Encounter: Payer: Self-pay | Admitting: Internal Medicine

## 2018-08-28 VITALS — BP 132/80 | HR 72 | Ht 65.0 in | Wt 185.2 lb

## 2018-08-28 DIAGNOSIS — I4891 Unspecified atrial fibrillation: Secondary | ICD-10-CM

## 2018-08-28 DIAGNOSIS — R55 Syncope and collapse: Secondary | ICD-10-CM

## 2018-08-28 DIAGNOSIS — Z950 Presence of cardiac pacemaker: Secondary | ICD-10-CM

## 2018-08-28 NOTE — Progress Notes (Signed)
HPI [name] returns today for ongoing evaluation and management of atrial fibrillation, hypertension, and intermittent complete heart block status post permanent pacemaker insertion. In the interim, he has been stable. He denies chest pain or shortness of breath. He has very minimal dyspnea but notes that he has become more sedentary and has of arthritis in his knees. He remains saddened over the loss of his wife who died several months ago.  Allergies  Allergen Reactions  . Tramadol Itching     Current Outpatient Medications  Medication Sig Dispense Refill  . benazepril-hydrochlorthiazide (LOTENSIN HCT) 20-12.5 MG tablet TAKE 1 TABLET BY MOUTH  DAILY (Patient taking differently: Take 1 tablet by mouth daily. ) 90 tablet 3  . carvedilol (COREG) 3.125 MG tablet TAKE 1 TABLET BY MOUTH TWO  TIMES DAILY (Patient taking differently: Take 3.125 mg by mouth 2 (two) times daily with a meal. ) 180 tablet 3  . Cholecalciferol (VITAMIN D3) 1000 units CAPS Take 1,000 Units by mouth daily.    Marland Kitchen doxazosin (CARDURA) 4 MG tablet Take 1 tablet (4 mg total) by mouth at bedtime. Please keep upcoming appt in December with Dr. Lovena Le for future refills. Thank you 90 tablet 0  . latanoprost (XALATAN) 0.005 % ophthalmic solution Place 1 drop into both eyes at bedtime.   3  . Multiple Vitamins-Minerals (PRESERVISION AREDS 2 PO) Take 2 capsules by mouth daily.     . potassium chloride SA (K-DUR,KLOR-CON) 20 MEQ tablet TAKE ONE-HALF TABLET BY  MOUTH DAILY (Patient taking differently: Take 10 mEq by mouth daily. ) 45 tablet 0  . simvastatin (ZOCOR) 20 MG tablet TAKE 1 TABLET BY MOUTH AT  BEDTIME (Patient taking differently: Take 20 mg by mouth at bedtime. ) 90 tablet 0  . warfarin (COUMADIN) 5 MG tablet Take 2.5mg  (1/2 tablet) daily or as directed by coumadin clinic (Patient taking differently: Take 2.5 mg by mouth daily. ) 45 tablet 1   No current facility-administered medications for this visit.     Facility-Administered Medications Ordered in Other Visits  Medication Dose Route Frequency Provider Last Rate Last Dose  . influenza  inactive virus vaccine (FLUZONE/FLUARIX) injection 0.5 mL  0.5 mL Intramuscular Once Rita Ohara, MD         Past Medical History:  Diagnosis Date  . A-fib ([place])   . Arthritis   . CAD (coronary artery disease)    GXT, neg bruce protocol GXT  . Chronic kidney disease    RENAL INSUFFICIENCY  . Diverticulosis   . ED (erectile dysfunction)   . GERD (gastroesophageal reflux disease)   . Glaucoma   . Gout    no flare in years   . Heart disorder   . Hypertension   . Obesity     ROS:   All systems reviewed and negative except as noted in the HPI.   Past Surgical History:  Procedure Laterality Date  . CATARACT EXTRACTION, BILATERAL  2018   with lens placement   . PACEMAKER INSERTION  01/09/03  . TONSILLECTOMY    . TOTAL KNEE ARTHROPLASTY Right 02/27/2018   Procedure: RIGHT TOTAL KNEE ARTHROPLASTY;  Surgeon: Paralee Cancel, MD;  Location: [facility];  Service: Orthopedics;  Laterality: Right;  70 mins     Family History  Problem Relation Age of Onset  . Heart disease Mother   . Hypertension Mother   . Kidney disease Mother   . Stroke Mother   . Heart disease Father  Social History   Socioeconomic History  . Marital status: Widowed    Spouse name: Not on file  . Number of children: 2  . Years of education: S-College  . Highest education level: Not on file  Occupational History    Comment: Works part time  Social Needs  . Financial resource strain: Not on file  . Food insecurity:    Worry: Not on file    Inability: Not on file  . Transportation needs:    Medical: Not on file    Non-medical: Not on file  Tobacco Use  . Smoking status: Former Smoker    Last attempt to quit: 09/13/1979    Years since quitting: 38.9  . Smokeless tobacco: Never Used  Substance and Sexual Activity  . Alcohol use: Yes    Comment: Consumes 2-3  glasses of wine per week; now only socially   . Drug use: No  . Sexual activity: Not on file  Lifestyle  . Physical activity:    Days per week: Not on file    Minutes per session: Not on file  . Stress: Not on file  Relationships  . Social connections:    Talks on phone: Not on file    Gets together: Not on file    Attends religious service: Not on file    Active member of club or organization: Not on file    Attends meetings of clubs or organizations: Not on file    Relationship status: Not on file  . Intimate partner violence:    Fear of current or ex partner: Not on file    Emotionally abused: Not on file    Physically abused: Not on file    Forced sexual activity: Not on file  Other Topics Concern  . Not on file  Social History Narrative  . Not on file     BP 132/80   Pulse 72   Ht 5\' 5"  (1.651 m)   Wt 185 lb 3.2 oz (84 kg)   SpO2 98%   BMI 30.82 kg/m   Physical Exam:  Well appearing NAD HEENT: Unremarkable Neck:  No JVD, no thyromegally Lymphatics:  No adenopathy Back:  No CVA tenderness Lungs:  Clear with no wheezes HEART:  IRegular rate rhythm, no murmurs, no rubs, no clicks Abd:  soft, positive bowel sounds, no organomegally, no rebound, no guarding Ext:  2 plus pulses, no edema, no cyanosis, no clubbing Skin:  No rashes no nodules Neuro:  CN II through XII intact, motor grossly intact  EKG - atrial fib with a controlled VR  DEVICE  Normal device function.  See PaceArt for details.   Assess/Plan: 1. Atrial fib - his ventricular rate is well controlled. We will follow. 2. PPM - his [employer] PPM is working normally. We will recheck in several months 3. HTN - his blood pressure is well controlled. No change. 4. Chest pain - his symptoms are non-cardiac. No additional evaluation for now.   Mikle Bosworth.D.

## 2018-08-28 NOTE — Patient Instructions (Signed)
Medication Instructions:  Your physician recommends that you continue on your current medications as directed. Please refer to the Current Medication list given to you today.  Labwork: None ordered.  Testing/Procedures: None ordered.  Follow-Up: Your physician wants you to follow-up in: one year with Dr. Lovena Le.   You will receive a reminder letter in the mail two months in advance. If you don't receive a letter, please call our office to schedule the follow-up appointment.  Remote monitoring is used to monitor your Pacemaker from home. This monitoring reduces the number of office visits required to check your device to one time per year. It allows [place] to keep an eye on the functioning of your device to ensure it is working properly. You are scheduled for a device check from home on 11/13/2018. You may send your transmission at any time that day. If you have a wireless device, the transmission will be sent automatically. After your physician reviews your transmission, you will receive a postcard with your next transmission date.  Any Other Special Instructions Will Be Listed Below (If Applicable).  If you need a refill on your cardiac medications before your next appointment, please call your pharmacy.

## 2018-08-30 NOTE — Progress Notes (Addendum)
08-28-18 Cardiac Clearance from Dr. Lovena Le on chart,   08-28-18 (Epic) EKG   08-07-18 Surgical Clearance from Dr. Redmond School on chart   08-31-18 BMP result routed to Dr. Alvan Dame for review

## 2018-08-30 NOTE — Patient Instructions (Signed)
[name].  08/30/2018   Your procedure is scheduled on: 09-06-18    Report to [facility] Main  Entrance    Report to Admitting at 6:10 AM    Call this number if you have problems the morning of surgery [phone]    Remember: Do not eat food or drink liquids :After Midnight.    BRUSH YOUR TEETH MORNING OF SURGERY AND RINSE YOUR MOUTH OUT, NO CHEWING GUM CANDY OR MINTS.     Take these medicines the morning of surgery with A SIP OF WATER: Carvedilol (Coreg)                                You may not have any metal on your body including hair pins and              piercings  Do not wear jewelry, cologne, lotions, powders or deodorant             Men may shave face and neck.   Do not bring valuables to the hospital. [name].  Contacts, dentures or bridgework may not be worn into surgery.  Leave suitcase in the car. After surgery it may be brought to your room.   Special Instructions: N/A              Please read over the following fact sheets you were given: _____________________________________________________________________             [facility] - Preparing for Surgery Before surgery, you can play an important role.  Because skin is not sterile, your skin needs to be as free of germs as possible.  You can reduce the number of germs on your skin by washing with CHG (chlorahexidine gluconate) soap before surgery.  CHG is an antiseptic cleaner which kills germs and bonds with the skin to continue killing germs even after washing. Please DO NOT use if you have an allergy to CHG or antibacterial soaps.  If your skin becomes reddened/irritated stop using the CHG and inform your nurse when you arrive at Short Stay. Do not shave (including legs and underarms) for at least 48 hours prior to the first CHG shower.  You may shave your face/neck. Please follow these instructions carefully:  1.   Shower with CHG Soap the night before surgery and the  morning of Surgery.  2.  If you choose to wash your hair, wash your hair first as usual with your  normal  shampoo.  3.  After you shampoo, rinse your hair and body thoroughly to remove the  shampoo.                           4.  Use CHG as you would any other liquid soap.  You can apply chg directly  to the skin and wash                       Gently with a scrungie or clean washcloth.  5.  Apply the CHG Soap to your body ONLY FROM THE NECK DOWN.   Do not use on face/ open  Wound or open sores. Avoid contact with eyes, ears mouth and genitals (private parts).                       Wash face,  Genitals (private parts) with your normal soap.             6.  Wash thoroughly, paying special attention to the area where your surgery  will be performed.  7.  Thoroughly rinse your body with warm water from the neck down.  8.  DO NOT shower/wash with your normal soap after using and rinsing off  the CHG Soap.                9.  Pat yourself dry with a clean towel.            10.  Wear clean pajamas.            11.  Place clean sheets on your bed the night of your first shower and do not  sleep with pets. Day of Surgery : Do not apply any lotions/deodorants the morning of surgery.  Please wear clean clothes to the hospital/surgery center.  FAILURE TO FOLLOW THESE INSTRUCTIONS MAY RESULT IN THE CANCELLATION OF YOUR SURGERY PATIENT SIGNATURE_________________________________  NURSE SIGNATURE__________________________________  ________________________________________________________________________   [name]  An incentive spirometer is a tool that can help keep your lungs clear and active. This tool measures how well you are filling your lungs with each breath. Taking long deep breaths may help reverse or decrease the chance of developing breathing (pulmonary) problems (especially infection) following:  A long  period of time when you are unable to move or be active. BEFORE THE PROCEDURE   If the spirometer includes an indicator to show your best effort, your nurse or respiratory therapist will set it to a desired goal.  If possible, sit up straight or lean slightly forward. Try not to slouch.  Hold the incentive spirometer in an upright position. INSTRUCTIONS FOR USE  1. Sit on the edge of your bed if possible, or sit up as far as you can in bed or on a chair. 2. Hold the incentive spirometer in an upright position. 3. Breathe out normally. 4. Place the mouthpiece in your mouth and seal your lips tightly around it. 5. Breathe in slowly and as deeply as possible, raising the piston or the ball toward the top of the column. 6. Hold your breath for 3-5 seconds or for as long as possible. Allow the piston or ball to fall to the bottom of the column. 7. Remove the mouthpiece from your mouth and breathe out normally. 8. Rest for a few seconds and repeat Steps 1 through 7 at least 10 times every 1-2 hours when you are awake. Take your time and take a few normal breaths between deep breaths. 9. The spirometer may include an indicator to show your best effort. Use the indicator as a goal to work toward during each repetition. 10. After each set of 10 deep breaths, practice coughing to be sure your lungs are clear. If you have an incision (the cut made at the time of surgery), support your incision when coughing by placing a pillow or rolled up towels firmly against it. Once you are able to get out of bed, walk around indoors and cough well. You may stop using the incentive spirometer when instructed by your caregiver.  RISKS AND COMPLICATIONS  Take your time so you do not get  dizzy or light-headed.  If you are in pain, you may need to take or ask for pain medication before doing incentive spirometry. It is harder to take a deep breath if you are having pain. AFTER USE  Rest and breathe slowly and  easily.  It can be helpful to keep track of a log of your progress. Your caregiver can provide you with a simple table to help with this. If you are using the spirometer at home, follow these instructions: [place] IF:   You are having difficultly using the spirometer.  You have trouble using the spirometer as often as instructed.  Your pain medication is not giving enough relief while using the spirometer.  You develop fever of 100.5 F (38.1 C) or higher. SEEK IMMEDIATE MEDICAL CARE IF:   You cough up bloody sputum that had not been present before.  You develop fever of 102 F (38.9 C) or greater.  You develop worsening pain at or near the incision site. MAKE SURE YOU:   Understand these instructions.  Will watch your condition.  Will get help right away if you are not doing well or get worse. Document Released: 01/09/2007 Document Revised: 11/21/2011 Document Reviewed: 03/12/2007 ExitCare Patient Information 2014 ExitCare, [place].   ________________________________________________________________________  WHAT IS A BLOOD TRANSFUSION? Blood Transfusion Information  A transfusion is the replacement of blood or some of its parts. Blood is made up of multiple cells which provide different functions.  Red blood cells carry oxygen and are used for blood loss replacement.  White blood cells fight against infection.  Platelets control bleeding.  Plasma helps clot blood.  Other blood products are available for specialized needs, such as hemophilia or other clotting disorders. BEFORE THE TRANSFUSION  Who gives blood for transfusions?   Healthy volunteers who are fully evaluated to make sure their blood is safe. This is blood bank blood. Transfusion therapy is the safest it has ever been in the practice of medicine. Before blood is taken from a donor, a complete history is taken to make sure that person has no history of diseases nor engages in risky social  behavior (examples are intravenous drug use or sexual activity with multiple partners). The donor's travel history is screened to minimize risk of transmitting infections, such as malaria. The donated blood is tested for signs of infectious diseases, such as HIV and hepatitis. The blood is then tested to be sure it is compatible with you in order to minimize the chance of a transfusion reaction. If you or a relative donates blood, this is often done in anticipation of surgery and is not appropriate for emergency situations. It takes many days to process the donated blood. RISKS AND COMPLICATIONS Although transfusion therapy is very safe and saves many lives, the main dangers of transfusion include:   Getting an infectious disease.  Developing a transfusion reaction. This is an allergic reaction to something in the blood you were given. Every precaution is taken to prevent this. The decision to have a blood transfusion has been considered carefully by your caregiver before blood is given. Blood is not given unless the benefits outweigh the risks. AFTER THE TRANSFUSION  Right after receiving a blood transfusion, you will usually feel much better and more energetic. This is especially true if your red blood cells have gotten low (anemic). The transfusion raises the level of the red blood cells which carry oxygen, and this usually causes an energy increase.  The nurse administering the transfusion will  monitor you carefully for complications. HOME CARE INSTRUCTIONS  No special instructions are needed after a transfusion. You may find your energy is better. Speak with your caregiver about any limitations on activity for underlying diseases you may have. SEEK MEDICAL CARE IF:   Your condition is not improving after your transfusion.  You develop redness or irritation at the intravenous (IV) site. SEEK IMMEDIATE MEDICAL CARE IF:  Any of the following symptoms occur over the next 12 hours:  Shaking  chills.  You have a temperature by mouth above 102 F (38.9 C), not controlled by medicine.  Chest, back, or muscle pain.  People around you feel you are not acting correctly or are confused.  Shortness of breath or difficulty breathing.  Dizziness and fainting.  You get a rash or develop hives.  You have a decrease in urine output.  Your urine turns a dark color or changes to pink, red, or brown. Any of the following symptoms occur over the next 10 days:  You have a temperature by mouth above 102 F (38.9 C), not controlled by medicine.  Shortness of breath.  Weakness after normal activity.  The white part of the eye turns yellow (jaundice).  You have a decrease in the amount of urine or are urinating less often.  Your urine turns a dark color or changes to pink, red, or brown. Document Released: 08/26/2000 Document Revised: 11/21/2011 Document Reviewed: 04/14/2008 [facility] Patient Information 2014 [place], [place].  _______________________________________________________________________

## 2018-08-31 ENCOUNTER — Encounter (HOSPITAL_COMMUNITY)
Admission: RE | Admit: 2018-08-31 | Discharge: 2018-08-31 | Disposition: A | Discharge status: Home / Self Care | Payer: Medicare Other | Source: Ambulatory Visit | Attending: Orthopedic Surgery | Admitting: Orthopedic Surgery

## 2018-08-31 ENCOUNTER — Encounter (HOSPITAL_COMMUNITY): Payer: Self-pay

## 2018-08-31 ENCOUNTER — Other Ambulatory Visit: Payer: Self-pay

## 2018-08-31 DIAGNOSIS — M1712 Unilateral primary osteoarthritis, left knee: Secondary | ICD-10-CM | POA: Diagnosis not present

## 2018-08-31 DIAGNOSIS — Z01812 Encounter for preprocedural laboratory examination: Secondary | ICD-10-CM | POA: Insufficient documentation

## 2018-08-31 LAB — BASIC METABOLIC PANEL
Anion gap: 9 (ref 5–15)
BUN: 24 mg/dL — ABNORMAL HIGH (ref 8–23)
CO2: 29 mmol/L (ref 22–32)
Calcium: 9.7 mg/dL (ref 8.9–10.3)
Chloride: 103 mmol/L (ref 98–111)
Creatinine, Ser: 1.28 mg/dL — ABNORMAL HIGH (ref 0.61–1.24)
GFR calc Af Amer: 59 mL/min — ABNORMAL LOW (ref >60)
GFR calc non Af Amer: 51 mL/min — ABNORMAL LOW (ref >60)
Glucose, Bld: 81 mg/dL (ref 70–99)
Potassium: 4.6 mmol/L (ref 3.5–5.1)
Sodium: 141 mmol/L (ref 135–145)

## 2018-08-31 LAB — SURGICAL PCR SCREEN
MRSA, PCR: NEGATIVE
Staphylococcus aureus: NEGATIVE

## 2018-08-31 LAB — CBC
HCT: 44.7 % (ref 39.0–52.0)
HEMOGLOBIN: 13.8 g/dL (ref 13.0–17.0)
MCH: 29.6 pg (ref 26.0–34.0)
MCHC: 30.9 g/dL (ref 30.0–36.0)
MCV: 95.9 fL (ref 80.0–100.0)
Platelets: 161 10*3/uL (ref 150–400)
RBC: 4.66 MIL/uL (ref 4.22–5.81)
RDW: 14 % (ref 11.5–15.5)
WBC: 5.5 10*3/uL (ref 4.0–10.5)
nRBC: 0 % (ref 0.0–0.2)

## 2018-09-06 ENCOUNTER — Encounter (HOSPITAL_COMMUNITY)
Admission: RE | Disposition: A | Discharge status: Home / Self Care | Payer: Self-pay | Source: Home / Self Care | Attending: Orthopedic Surgery

## 2018-09-06 ENCOUNTER — Inpatient Hospital Stay (HOSPITAL_COMMUNITY): Payer: Medicare Other | Admitting: Certified Registered"

## 2018-09-06 ENCOUNTER — Other Ambulatory Visit: Payer: Self-pay

## 2018-09-06 ENCOUNTER — Inpatient Hospital Stay (HOSPITAL_COMMUNITY)
Admission: RE | Admit: 2018-09-06 | Discharge: 2018-09-07 | DRG: 470 | Disposition: A | Discharge status: Home / Self Care | Payer: Medicare Other | Attending: Orthopedic Surgery | Admitting: Orthopedic Surgery

## 2018-09-06 ENCOUNTER — Encounter (HOSPITAL_COMMUNITY): Payer: Self-pay

## 2018-09-06 DIAGNOSIS — Z950 Presence of cardiac pacemaker: Secondary | ICD-10-CM | POA: Diagnosis not present

## 2018-09-06 DIAGNOSIS — N183 Chronic kidney disease, stage 3 (moderate): Secondary | ICD-10-CM | POA: Diagnosis not present

## 2018-09-06 DIAGNOSIS — Z7901 Long term (current) use of anticoagulants: Secondary | ICD-10-CM

## 2018-09-06 DIAGNOSIS — Z96651 Presence of right artificial knee joint: Secondary | ICD-10-CM | POA: Diagnosis present

## 2018-09-06 DIAGNOSIS — I2510 Atherosclerotic heart disease of native coronary artery without angina pectoris: Secondary | ICD-10-CM | POA: Diagnosis present

## 2018-09-06 DIAGNOSIS — E669 Obesity, unspecified: Secondary | ICD-10-CM | POA: Diagnosis present

## 2018-09-06 DIAGNOSIS — Z79899 Other long term (current) drug therapy: Secondary | ICD-10-CM | POA: Diagnosis not present

## 2018-09-06 DIAGNOSIS — Z885 Allergy status to narcotic agent status: Secondary | ICD-10-CM

## 2018-09-06 DIAGNOSIS — Z87891 Personal history of nicotine dependence: Secondary | ICD-10-CM | POA: Diagnosis not present

## 2018-09-06 DIAGNOSIS — I4891 Unspecified atrial fibrillation: Secondary | ICD-10-CM | POA: Diagnosis not present

## 2018-09-06 DIAGNOSIS — Z6830 Body mass index (BMI) 30.0-30.9, adult: Secondary | ICD-10-CM | POA: Diagnosis not present

## 2018-09-06 DIAGNOSIS — H409 Unspecified glaucoma: Secondary | ICD-10-CM | POA: Diagnosis not present

## 2018-09-06 DIAGNOSIS — Z96652 Presence of left artificial knee joint: Secondary | ICD-10-CM

## 2018-09-06 DIAGNOSIS — I129 Hypertensive chronic kidney disease with stage 1 through stage 4 chronic kidney disease, or unspecified chronic kidney disease: Secondary | ICD-10-CM | POA: Diagnosis not present

## 2018-09-06 DIAGNOSIS — I10 Essential (primary) hypertension: Secondary | ICD-10-CM | POA: Diagnosis present

## 2018-09-06 DIAGNOSIS — M1712 Unilateral primary osteoarthritis, left knee: Secondary | ICD-10-CM | POA: Diagnosis not present

## 2018-09-06 DIAGNOSIS — Z96653 Presence of artificial knee joint, bilateral: Secondary | ICD-10-CM

## 2018-09-06 DIAGNOSIS — M109 Gout, unspecified: Secondary | ICD-10-CM | POA: Diagnosis not present

## 2018-09-06 DIAGNOSIS — G8918 Other acute postprocedural pain: Secondary | ICD-10-CM | POA: Diagnosis not present

## 2018-09-06 HISTORY — PX: TOTAL KNEE ARTHROPLASTY: SHX125

## 2018-09-06 LAB — CBC
HCT: 42.1 % (ref 39.0–52.0)
Hemoglobin: 12.9 g/dL — ABNORMAL LOW (ref 13.0–17.0)
MCH: 29.9 pg (ref 26.0–34.0)
MCHC: 30.6 g/dL (ref 30.0–36.0)
MCV: 97.5 fL (ref 80.0–100.0)
Platelets: 137 10*3/uL — ABNORMAL LOW (ref 150–400)
RBC: 4.32 MIL/uL (ref 4.22–5.81)
RDW: 14.2 % (ref 11.5–15.5)
WBC: 6 10*3/uL (ref 4.0–10.5)
nRBC: 0 % (ref 0.0–0.2)

## 2018-09-06 LAB — TYPE AND SCREEN
ABO/RH(D): O POS
Antibody Screen: NEGATIVE

## 2018-09-06 LAB — CREATININE, SERUM
Creatinine, Ser: 1.38 mg/dL — ABNORMAL HIGH (ref 0.61–1.24)
GFR calc Af Amer: 54 mL/min — ABNORMAL LOW (ref >60)
GFR calc non Af Amer: 46 mL/min — ABNORMAL LOW (ref >60)

## 2018-09-06 LAB — PROTIME-INR
INR: 1.22
Prothrombin Time: 15.3 seconds — ABNORMAL HIGH (ref 11.4–15.2)

## 2018-09-06 SURGERY — ARTHROPLASTY, KNEE, TOTAL
Anesthesia: Spinal | Site: Knee | Laterality: Left

## 2018-09-06 MED ORDER — METHOCARBAMOL 500 MG IVPB - SIMPLE MED
500.0000 mg | Freq: Four times a day (QID) | INTRAVENOUS | Status: DC | PRN
  Filled 2018-09-06 (×2): qty 50

## 2018-09-06 MED ORDER — DOXAZOSIN MESYLATE 4 MG PO TABS
4.0000 mg | ORAL_TABLET | Freq: Every day | ORAL | Status: DC
  Administered 2018-09-06: 4 mg via ORAL
  Filled 2018-09-06: qty 1

## 2018-09-06 MED ORDER — SODIUM CHLORIDE 0.9 % IV SOLN
INTRAVENOUS | Status: DC | PRN
  Administered 2018-09-06: 50 ug/min via INTRAVENOUS

## 2018-09-06 MED ORDER — DEXAMETHASONE SODIUM PHOSPHATE 10 MG/ML IJ SOLN: 10.0000 mg | Freq: Once | INTRAMUSCULAR | Status: DC

## 2018-09-06 MED ORDER — HYDROCODONE-ACETAMINOPHEN 7.5-325 MG PO TABS: 1.0000 | ORAL_TABLET | ORAL | Status: DC | PRN

## 2018-09-06 MED ORDER — CARVEDILOL 3.125 MG PO TABS
3.1250 mg | ORAL_TABLET | Freq: Two times a day (BID) | ORAL | Status: DC
  Administered 2018-09-06 – 2018-09-07 (×2): 3.125 mg via ORAL
  Filled 2018-09-06 (×2): qty 1

## 2018-09-06 MED ORDER — WARFARIN - PHARMACIST DOSING INPATIENT
Freq: Every day | Status: DC
  Administered 2018-09-06: 17:00:00

## 2018-09-06 MED ORDER — SODIUM CHLORIDE 0.9 % IV SOLN
INTRAVENOUS | Status: DC
  Administered 2018-09-06: 22:00:00 via INTRAVENOUS

## 2018-09-06 MED ORDER — LATANOPROST 0.005 % OP SOLN
1.0000 [drp] | Freq: Every day | OPHTHALMIC | Status: DC
  Administered 2018-09-06: 1 [drp] via OPHTHALMIC
  Filled 2018-09-06: qty 2.5

## 2018-09-06 MED ORDER — ONDANSETRON HCL 4 MG PO TABS: 4.0000 mg | ORAL_TABLET | Freq: Four times a day (QID) | ORAL | Status: DC | PRN

## 2018-09-06 MED ORDER — BUPIVACAINE-EPINEPHRINE (PF) 0.25% -1:200000 IJ SOLN
INTRAMUSCULAR | Status: DC | PRN
  Administered 2018-09-06: 30 mL via PERINEURAL

## 2018-09-06 MED ORDER — ONDANSETRON HCL 4 MG/2ML IJ SOLN: 4.0000 mg | Freq: Once | INTRAMUSCULAR | Status: DC | PRN

## 2018-09-06 MED ORDER — FENTANYL CITRATE (PF) 100 MCG/2ML IJ SOLN: 25.0000 ug | INTRAMUSCULAR | Status: DC | PRN

## 2018-09-06 MED ORDER — HYDROCODONE-ACETAMINOPHEN 7.5-325 MG PO TABS: 1.0000 | ORAL_TABLET | ORAL | 0 refills | Status: DC | PRN

## 2018-09-06 MED ORDER — HYDROMORPHONE HCL 1 MG/ML IJ SOLN
0.5000 mg | INTRAMUSCULAR | Status: DC | PRN
  Administered 2018-09-07: 1 mg via INTRAVENOUS
  Filled 2018-09-06: qty 1

## 2018-09-06 MED ORDER — ENOXAPARIN SODIUM 40 MG/0.4ML ~~LOC~~ SOLN
40.0000 mg | SUBCUTANEOUS | Status: DC
  Administered 2018-09-07: 40 mg via SUBCUTANEOUS
  Filled 2018-09-06: qty 0.4

## 2018-09-06 MED ORDER — BUPIVACAINE-EPINEPHRINE (PF) 0.25% -1:200000 IJ SOLN
INTRAMUSCULAR | Status: AC
  Filled 2018-09-06: qty 30

## 2018-09-06 MED ORDER — DEXAMETHASONE SODIUM PHOSPHATE 10 MG/ML IJ SOLN
INTRAMUSCULAR | Status: DC | PRN
  Administered 2018-09-06: 10 mg via INTRAVENOUS

## 2018-09-06 MED ORDER — PHENYLEPHRINE 40 MCG/ML (10ML) SYRINGE FOR IV PUSH (FOR BLOOD PRESSURE SUPPORT)
PREFILLED_SYRINGE | INTRAVENOUS | Status: DC | PRN
  Administered 2018-09-06 (×2): 120 ug via INTRAVENOUS

## 2018-09-06 MED ORDER — PROPOFOL 10 MG/ML IV BOLUS
INTRAVENOUS | Status: DC | PRN
  Administered 2018-09-06 (×2): 20 mg via INTRAVENOUS

## 2018-09-06 MED ORDER — BUPIVACAINE IN DEXTROSE 0.75-8.25 % IT SOLN
INTRATHECAL | Status: DC | PRN
  Administered 2018-09-06: 1.7 mL via INTRATHECAL

## 2018-09-06 MED ORDER — SODIUM CHLORIDE (PF) 0.9 % IJ SOLN
INTRAMUSCULAR | Status: DC | PRN
  Administered 2018-09-06: 50 mL via INTRAVENOUS

## 2018-09-06 MED ORDER — ONDANSETRON HCL 4 MG/2ML IJ SOLN
INTRAMUSCULAR | Status: DC | PRN
  Administered 2018-09-06: 4 mg via INTRAVENOUS

## 2018-09-06 MED ORDER — DIPHENHYDRAMINE HCL 12.5 MG/5ML PO ELIX: 12.5000 mg | ORAL_SOLUTION | ORAL | Status: DC | PRN

## 2018-09-06 MED ORDER — DEXAMETHASONE SODIUM PHOSPHATE 10 MG/ML IJ SOLN
INTRAMUSCULAR | Status: AC
  Filled 2018-09-06: qty 1

## 2018-09-06 MED ORDER — PROPOFOL 10 MG/ML IV BOLUS
INTRAVENOUS | Status: AC
  Filled 2018-09-06: qty 80

## 2018-09-06 MED ORDER — ONDANSETRON HCL 4 MG/2ML IJ SOLN
INTRAMUSCULAR | Status: AC
  Filled 2018-09-06: qty 2

## 2018-09-06 MED ORDER — LACTATED RINGERS IV SOLN
INTRAVENOUS | Status: DC
  Administered 2018-09-06: 07:00:00 via INTRAVENOUS

## 2018-09-06 MED ORDER — DOCUSATE SODIUM 100 MG PO CAPS: 100.0000 mg | ORAL_CAPSULE | Freq: Two times a day (BID) | ORAL | 0 refills | Status: DC

## 2018-09-06 MED ORDER — ALUM & MAG HYDROXIDE-SIMETH 200-200-20 MG/5ML PO SUSP: 15.0000 mL | ORAL | Status: DC | PRN

## 2018-09-06 MED ORDER — POTASSIUM CHLORIDE CRYS ER 10 MEQ PO TBCR
10.0000 meq | EXTENDED_RELEASE_TABLET | Freq: Every day | ORAL | Status: DC
  Administered 2018-09-06 – 2018-09-07 (×2): 10 meq via ORAL
  Filled 2018-09-06 (×2): qty 1

## 2018-09-06 MED ORDER — FENTANYL CITRATE (PF) 100 MCG/2ML IJ SOLN
50.0000 ug | INTRAMUSCULAR | Status: DC
  Administered 2018-09-06: 50 ug via INTRAVENOUS
  Filled 2018-09-06: qty 2

## 2018-09-06 MED ORDER — MAGNESIUM CITRATE PO SOLN: 1.0000 | Freq: Once | ORAL | Status: DC | PRN

## 2018-09-06 MED ORDER — PHENOL 1.4 % MT LIQD: 1.0000 | OROMUCOSAL | Status: DC | PRN

## 2018-09-06 MED ORDER — CELECOXIB 200 MG PO CAPS: 200.0000 mg | ORAL_CAPSULE | Freq: Two times a day (BID) | ORAL | Status: DC

## 2018-09-06 MED ORDER — PROPOFOL 500 MG/50ML IV EMUL
INTRAVENOUS | Status: DC | PRN
  Administered 2018-09-06: 75 ug/kg/min via INTRAVENOUS

## 2018-09-06 MED ORDER — MEPERIDINE HCL 50 MG/ML IJ SOLN: 6.2500 mg | INTRAMUSCULAR | Status: DC | PRN

## 2018-09-06 MED ORDER — BISACODYL 10 MG RE SUPP: 10.0000 mg | Freq: Every day | RECTAL | Status: DC | PRN

## 2018-09-06 MED ORDER — MENTHOL 3 MG MT LOZG: 1.0000 | LOZENGE | OROMUCOSAL | Status: DC | PRN

## 2018-09-06 MED ORDER — ONDANSETRON HCL 4 MG/2ML IJ SOLN
4.0000 mg | Freq: Four times a day (QID) | INTRAMUSCULAR | Status: DC | PRN
  Administered 2018-09-07: 4 mg via INTRAVENOUS
  Filled 2018-09-06: qty 2

## 2018-09-06 MED ORDER — HYDROCODONE-ACETAMINOPHEN 5-325 MG PO TABS
1.0000 | ORAL_TABLET | ORAL | Status: DC | PRN
  Administered 2018-09-06 (×2): 2 via ORAL
  Administered 2018-09-07: 1 via ORAL
  Administered 2018-09-07 (×2): 2 via ORAL
  Filled 2018-09-06 (×5): qty 2

## 2018-09-06 MED ORDER — DEXAMETHASONE SODIUM PHOSPHATE 10 MG/ML IJ SOLN
10.0000 mg | Freq: Once | INTRAMUSCULAR | Status: AC
  Administered 2018-09-07: 10 mg via INTRAVENOUS
  Filled 2018-09-06: qty 1

## 2018-09-06 MED ORDER — TRANEXAMIC ACID-NACL 1000-0.7 MG/100ML-% IV SOLN
1000.0000 mg | Freq: Once | INTRAVENOUS | Status: AC
  Administered 2018-09-06: 1000 mg via INTRAVENOUS
  Filled 2018-09-06: qty 100

## 2018-09-06 MED ORDER — METHOCARBAMOL 500 MG PO TABS
500.0000 mg | ORAL_TABLET | Freq: Four times a day (QID) | ORAL | Status: DC | PRN
  Administered 2018-09-06 – 2018-09-07 (×2): 500 mg via ORAL
  Filled 2018-09-06 (×2): qty 1

## 2018-09-06 MED ORDER — FERROUS SULFATE 325 (65 FE) MG PO TABS
325.0000 mg | ORAL_TABLET | Freq: Two times a day (BID) | ORAL | Status: DC
  Administered 2018-09-06 – 2018-09-07 (×2): 325 mg via ORAL
  Filled 2018-09-06 (×2): qty 1

## 2018-09-06 MED ORDER — MIDAZOLAM HCL 2 MG/2ML IJ SOLN
1.0000 mg | INTRAMUSCULAR | Status: DC
  Filled 2018-09-06: qty 2

## 2018-09-06 MED ORDER — PHENYLEPHRINE 40 MCG/ML (10ML) SYRINGE FOR IV PUSH (FOR BLOOD PRESSURE SUPPORT)
PREFILLED_SYRINGE | INTRAVENOUS | Status: AC
  Filled 2018-09-06: qty 10

## 2018-09-06 MED ORDER — CHLORHEXIDINE GLUCONATE 4 % EX LIQD: 60.0000 mL | Freq: Once | CUTANEOUS | Status: DC

## 2018-09-06 MED ORDER — POLYETHYLENE GLYCOL 3350 17 G PO PACK
17.0000 g | PACK | Freq: Two times a day (BID) | ORAL | Status: DC
  Administered 2018-09-07: 17 g via ORAL
  Filled 2018-09-06 (×2): qty 1

## 2018-09-06 MED ORDER — POLYETHYLENE GLYCOL 3350 17 G PO PACK: 17.0000 g | PACK | Freq: Two times a day (BID) | ORAL | 0 refills | Status: DC

## 2018-09-06 MED ORDER — WARFARIN SODIUM 4 MG PO TABS
4.0000 mg | ORAL_TABLET | Freq: Once | ORAL | Status: AC
  Administered 2018-09-06: 4 mg via ORAL
  Filled 2018-09-06: qty 1

## 2018-09-06 MED ORDER — PHENYLEPHRINE HCL 10 MG/ML IJ SOLN
INTRAMUSCULAR | Status: AC
  Filled 2018-09-06: qty 2

## 2018-09-06 MED ORDER — ENOXAPARIN SODIUM 40 MG/0.4ML ~~LOC~~ SOLN: 40.0000 mg | SUBCUTANEOUS | 0 refills | Status: DC

## 2018-09-06 MED ORDER — CEFAZOLIN SODIUM-DEXTROSE 2-4 GM/100ML-% IV SOLN
2.0000 g | INTRAVENOUS | Status: AC
  Administered 2018-09-06: 2 g via INTRAVENOUS
  Filled 2018-09-06: qty 100

## 2018-09-06 MED ORDER — SODIUM CHLORIDE (PF) 0.9 % IJ SOLN
INTRAMUSCULAR | Status: AC
  Filled 2018-09-06: qty 50

## 2018-09-06 MED ORDER — TRANEXAMIC ACID-NACL 1000-0.7 MG/100ML-% IV SOLN
1000.0000 mg | INTRAVENOUS | Status: AC
  Administered 2018-09-06: 1000 mg via INTRAVENOUS
  Filled 2018-09-06: qty 100

## 2018-09-06 MED ORDER — KETOROLAC TROMETHAMINE 30 MG/ML IJ SOLN
INTRAMUSCULAR | Status: DC | PRN
  Administered 2018-09-06: 30 mg via INTRAVENOUS

## 2018-09-06 MED ORDER — DOCUSATE SODIUM 100 MG PO CAPS
100.0000 mg | ORAL_CAPSULE | Freq: Two times a day (BID) | ORAL | Status: DC
  Administered 2018-09-06 – 2018-09-07 (×2): 100 mg via ORAL
  Filled 2018-09-06 (×2): qty 1

## 2018-09-06 MED ORDER — KETOROLAC TROMETHAMINE 30 MG/ML IJ SOLN
INTRAMUSCULAR | Status: AC
  Filled 2018-09-06: qty 1

## 2018-09-06 MED ORDER — SIMVASTATIN 20 MG PO TABS
20.0000 mg | ORAL_TABLET | Freq: Every day | ORAL | Status: DC
  Administered 2018-09-06: 20 mg via ORAL
  Filled 2018-09-06: qty 1

## 2018-09-06 MED ORDER — ACETAMINOPHEN 325 MG PO TABS: 325.0000 mg | ORAL_TABLET | Freq: Four times a day (QID) | ORAL | Status: DC | PRN

## 2018-09-06 MED ORDER — METOCLOPRAMIDE HCL 5 MG/ML IJ SOLN: 5.0000 mg | Freq: Three times a day (TID) | INTRAMUSCULAR | Status: DC | PRN

## 2018-09-06 MED ORDER — METOCLOPRAMIDE HCL 5 MG PO TABS: 5.0000 mg | ORAL_TABLET | Freq: Three times a day (TID) | ORAL | Status: DC | PRN

## 2018-09-06 MED ORDER — ROPIVACAINE HCL 7.5 MG/ML IJ SOLN
INTRAMUSCULAR | Status: DC | PRN
  Administered 2018-09-06: 20 mL via PERINEURAL

## 2018-09-06 MED ORDER — FERROUS SULFATE 325 (65 FE) MG PO TABS: 325.0000 mg | ORAL_TABLET | Freq: Three times a day (TID) | ORAL | 3 refills | Status: DC

## 2018-09-06 MED ORDER — CEFAZOLIN SODIUM-DEXTROSE 2-4 GM/100ML-% IV SOLN
2.0000 g | Freq: Four times a day (QID) | INTRAVENOUS | Status: AC
  Administered 2018-09-06 (×2): 2 g via INTRAVENOUS
  Filled 2018-09-06 (×2): qty 100

## 2018-09-06 MED ORDER — METHOCARBAMOL 500 MG PO TABS: 500.0000 mg | ORAL_TABLET | Freq: Four times a day (QID) | ORAL | 0 refills | Status: DC | PRN

## 2018-09-06 SURGICAL SUPPLY — 60 items
ADH SKN CLS APL DERMABOND .7 (GAUZE/BANDAGES/DRESSINGS) ×1
ATTUNE MED ANAT PAT 38 KNEE (Knees) ×1 IMPLANT
ATTUNE MED ANAT PAT 38MM KNEE (Knees) ×1 IMPLANT
ATTUNE PS FEM LT SZ 5 CEM KNEE (Femur) ×2 IMPLANT
ATTUNE PSRP INSR SZ5 8 KNEE (Insert) ×1 IMPLANT
ATTUNE PSRP INSR SZ5 8MM KNEE (Insert) ×1 IMPLANT
BAG SPEC THK2 15X12 ZIP CLS (MISCELLANEOUS)
BAG ZIPLOCK 12X15 (MISCELLANEOUS) IMPLANT
BANDAGE ACE 6X5 VEL STRL LF (GAUZE/BANDAGES/DRESSINGS) ×3 IMPLANT
BASE TIBIAL ROT PLAT SZ 7 KNEE (Knees) IMPLANT
BLADE SAG 18X100X1.27 (BLADE) ×2 IMPLANT
BLADE SAW SGTL 11.0X1.19X90.0M (BLADE) ×2 IMPLANT
BLADE SAW SGTL 13.0X1.19X90.0M (BLADE) ×5 IMPLANT
BLADE SURG SZ10 CARB STEEL (BLADE) ×6 IMPLANT
BOWL SMART MIX CTS (DISPOSABLE) ×3 IMPLANT
BSPLAT TIB 7 CMNT ROT PLAT STR (Knees) ×1 IMPLANT
CEMENT HV SMART SET (Cement) ×4 IMPLANT
COVER SURGICAL LIGHT HANDLE (MISCELLANEOUS) ×3 IMPLANT
COVER WAND RF STERILE (DRAPES) ×2 IMPLANT
CUFF TOURN SGL QUICK 34 (TOURNIQUET CUFF) ×3
CUFF TRNQT CYL 34X4X40X1 (TOURNIQUET CUFF) ×1 IMPLANT
DECANTER SPIKE VIAL GLASS SM (MISCELLANEOUS) ×6 IMPLANT
DERMABOND ADVANCED (GAUZE/BANDAGES/DRESSINGS) ×2
DERMABOND ADVANCED .7 DNX12 (GAUZE/BANDAGES/DRESSINGS) ×1 IMPLANT
DRAPE U-SHAPE 47X51 STRL (DRAPES) ×3 IMPLANT
DRESSING AQUACEL AG SP 3.5X10 (GAUZE/BANDAGES/DRESSINGS) ×1 IMPLANT
DRSG AQUACEL AG ADV 3.5X10 (GAUZE/BANDAGES/DRESSINGS) ×2 IMPLANT
DRSG AQUACEL AG SP 3.5X10 (GAUZE/BANDAGES/DRESSINGS) ×3
DURAPREP 26ML APPLICATOR (WOUND CARE) ×6 IMPLANT
ELECT REM PT RETURN 15FT ADLT (MISCELLANEOUS) ×3 IMPLANT
GLOVE BIOGEL M 7.0 STRL (GLOVE) ×6 IMPLANT
GLOVE BIOGEL PI IND STRL 7.5 (GLOVE) ×1 IMPLANT
GLOVE BIOGEL PI IND STRL 8.5 (GLOVE) ×1 IMPLANT
GLOVE BIOGEL PI INDICATOR 7.5 (GLOVE) ×2
GLOVE BIOGEL PI INDICATOR 8.5 (GLOVE) ×2
GLOVE ECLIPSE 8.0 STRL XLNG CF (GLOVE) ×3 IMPLANT
GLOVE ORTHO TXT STRL SZ7.5 (GLOVE) ×8 IMPLANT
GOWN STRL REUS W/TWL 2XL LVL3 (GOWN DISPOSABLE) ×3 IMPLANT
GOWN STRL REUS W/TWL LRG LVL3 (GOWN DISPOSABLE) ×3 IMPLANT
HANDPIECE INTERPULSE COAX TIP (DISPOSABLE) ×6
HOLDER FOLEY CATH W/STRAP (MISCELLANEOUS) ×2 IMPLANT
MANIFOLD NEPTUNE II (INSTRUMENTS) ×3 IMPLANT
NDL SAFETY ECLIPSE 18X1.5 (NEEDLE) IMPLANT
NEEDLE HYPO 18GX1.5 SHARP (NEEDLE) ×3
PACK TOTAL KNEE CUSTOM (KITS) ×3 IMPLANT
PIN FIX SIGMA HP QUICK REL (PIN) ×2 IMPLANT
PIN THREADED HEADED SIGMA (PIN) ×2 IMPLANT
PROTECTOR NERVE ULNAR (MISCELLANEOUS) ×3 IMPLANT
SET HNDPC FAN SPRY TIP SCT (DISPOSABLE) ×1 IMPLANT
SET PAD KNEE POSITIONER (MISCELLANEOUS) ×3 IMPLANT
SUT MNCRL AB 4-0 PS2 18 (SUTURE) ×3 IMPLANT
SUT STRATAFIX PDS+ 0 24IN (SUTURE) ×3 IMPLANT
SUT VIC AB 1 CT1 36 (SUTURE) ×3 IMPLANT
SUT VIC AB 2-0 CT1 27 (SUTURE) ×9
SUT VIC AB 2-0 CT1 TAPERPNT 27 (SUTURE) ×3 IMPLANT
TIBIAL BASE ROT PLAT SZ 7 KNEE (Knees) ×3 IMPLANT
TRAY FOLEY MTR SLVR 16FR STAT (SET/KITS/TRAYS/PACK) ×3 IMPLANT
WATER STERILE IRR 1000ML POUR (IV SOLUTION) ×3 IMPLANT
WRAP KNEE MAXI GEL POST OP (GAUZE/BANDAGES/DRESSINGS) ×3 IMPLANT
YANKAUER SUCT BULB TIP 10FT TU (MISCELLANEOUS) ×3 IMPLANT

## 2018-09-06 NOTE — Evaluation (Signed)
Physical Therapy Evaluation Patient Details Name: [name]. MRN: [id number] DOB: August 06, 1931 Today's Date: 09/06/2018   History of Present Illness  84 yo male s/p L TKR on 09/06/18. PMH includes R TKR 02/2018, GERD, CAD, afib, CKD III, glaucoma, HLD, HTN, pacemaker 2004.   Clinical Impression   Pt presents with L knee pain, decreased L knee ROM, difficulty performing mobility tasks, and decreased tolerance for ambulation due to pain. Pt to benefit from acute PT to address deficits. Pt ambulated 60 ft with RW with min guard assist, verbal cuing provided. Pt educated on ankle pumps (20/hour)to perform this afternoon/evening to increase circulation, to pt's tolerance and limited by pain. PT to progress mobility as tolerated, and will continue to follow acutely.    Note: Pt not in therapeutic INR range, medication set to be given at 1800 today. RN states it is appropriate to mobilize before medication given. Pt with history of afib.   Follow Up Recommendations Follow surgeon's recommendation for DC plan and follow-up therapies;Supervision for mobility/OOB(OPPT)    Equipment Recommendations  None recommended by PT    Recommendations for Other Services       Precautions / Restrictions Precautions Precautions: Fall;ICD/Pacemaker Restrictions Weight Bearing Restrictions: No Other Position/Activity Restrictions: WBAT       Mobility  Bed Mobility Overal bed mobility: Needs Assistance Bed Mobility: Supine to Sit     Supine to sit: Min assist;HOB elevated     General bed mobility comments: Min assist for trunk elevation, scooting to EOB. Increased time and effort to perform.   Transfers Overall transfer level: Needs assistance Equipment used: Rolling walker (2 wheeled) Transfers: Sit to/from Stand Sit to Stand: Min guard;From elevated surface         General transfer comment: Min guard for safety. increased time to rise, verbal cuing for hand placement. Pt reluctant to  WB on LLE.   Ambulation/Gait Ambulation/Gait assistance: Min guard;+2 safety/equipment(chair follow) Gait Distance (Feet): 60 Feet Assistive device: Rolling walker (2 wheeled) Gait Pattern/deviations: Step-to pattern;Decreased stance time - left;Decreased weight shift to left;Antalgic;Trunk flexed Gait velocity: decr    General Gait Details: Min guard for safety. Verbal cuing for placement in RW, turning, posture/looking forward as opposed to downward, sequencing.   Stairs            Wheelchair Mobility    Modified Rankin (Stroke Patients Only)       Balance Overall balance assessment: Mild deficits observed, not formally tested                                           Pertinent Vitals/Pain Pain Assessment: 0-10 Pain Score: 3  Pain Location: L knee  Pain Descriptors / Indicators: Aching Pain Intervention(s): Limited activity within patient's tolerance;Repositioned;Ice applied;Monitored during session    [place] expects to be discharged to:: Private residence Living Arrangements: Children(pt lives with daughter ) Available Help at Discharge: Family;Available 24 hours/day Type of Home: House Home Access: Stairs to enter Entrance Stairs-Rails: Right Entrance Stairs-Number of Steps: 3 Home Layout: One level Home Equipment: Shower seat;Hand held shower head;Grab bars - tub/shower;Walker - 2 wheels;Cane - single point;Bedside commode;Toilet riser      Prior Function Level of Independence: Independent with assistive device(s)         Comments: Pt reports using cane for ambulation prior to admission.      Hand Dominance  Dominant Hand: Right    Extremity/Trunk Assessment   Upper Extremity Assessment Upper Extremity Assessment: Overall WFL for tasks assessed    Lower Extremity Assessment Lower Extremity Assessment: Overall WFL for tasks assessed;LLE deficits/detail LLE Deficits / Details: suspected post-surgical  weakness; able to perform ankle pumps, quad set, SLR LLE Sensation: WNL    Cervical / Trunk Assessment Cervical / Trunk Assessment: Normal  Communication   Communication: No difficulties  Cognition Arousal/Alertness: Awake/alert Behavior During Therapy: WFL for tasks assessed/performed Overall Cognitive Status: Within Functional Limits for tasks assessed                                        General Comments      Exercises Total Joint Exercises Goniometric ROM: knee aarom ~5-50*, limited by pain    Assessment/Plan    PT Assessment Patient needs continued PT services  PT Problem List Decreased strength;Pain;Decreased range of motion;Decreased activity tolerance;Decreased knowledge of use of DME;Decreased balance;Decreased mobility       PT Treatment Interventions DME instruction;Therapeutic activities;Gait training;Therapeutic exercise;Patient/family education;Stair training;Balance training    PT Goals (Current goals can be found in the Care Plan section)  Acute Rehab PT Goals Patient Stated Goal: none stated  PT Goal Formulation: With patient Time For Goal Achievement: 09/13/18 Potential to Achieve Goals: Good    Frequency 7X/week   Barriers to discharge        Co-evaluation               AM-PAC PT "6 Clicks" Mobility  Outcome Measure Help needed turning from your back to your side while in a flat bed without using bedrails?: A Little Help needed moving from lying on your back to sitting on the side of a flat bed without using bedrails?: A Little Help needed moving to and from a bed to a chair (including a wheelchair)?: A Little Help needed standing up from a chair using your arms (e.g., wheelchair or bedside chair)?: A Little Help needed to walk in hospital room?: A Little Help needed climbing 3-5 steps with a railing? : A Little 6 Click Score: 18    End of Session Equipment Utilized During Treatment: Gait belt Activity Tolerance:  Patient tolerated treatment well Patient left: in chair;with chair alarm set;with call bell/phone within reach;with family/visitor present;with SCD's reapplied Nurse Communication: Mobility status PT Visit Diagnosis: Other abnormalities of gait and mobility (R26.89);Difficulty in walking, not elsewhere classified (R26.2)    Time: 3419-6222 PT Time Calculation (min) (ACUTE ONLY): 25 min   Charges:   PT Evaluation $PT Eval Low Complexity: 1 Low PT Treatments $Gait Training: 8-22 mins        Julien Girt, PT Acute Rehabilitation Services Pager [phone]  Office [phone]  Kathlen Sakurai D Cameron 09/06/2018, 5:30 PM

## 2018-09-06 NOTE — Anesthesia Preprocedure Evaluation (Addendum)
Anesthesia Evaluation  Patient identified by MRN, date of birth, ID band Patient awake    Reviewed: Allergy & Precautions, NPO status , Patient's Chart, lab work & pertinent test results, reviewed documented beta blocker date and time   Airway Mallampati: I  TM Distance: >3 FB Neck ROM: Full    Dental no notable dental hx. (+) Teeth Intact, Caps   Pulmonary former smoker,    Pulmonary exam normal breath sounds clear to auscultation       Cardiovascular hypertension, Pt. on medications and Pt. on home beta blockers + CAD  Normal cardiovascular exam+ dysrhythmias Atrial Fibrillation + pacemaker  Rhythm:Regular Rate:Normal     Neuro/Psych negative neurological ROS  negative psych ROS   GI/Hepatic Neg liver ROS, GERD  Medicated and Controlled,Hx/o diverticulosis   Endo/Other  Obesity Hyperlipidemia Gout  Renal/GU Renal InsufficiencyRenal disease   ED    Musculoskeletal  (+) Arthritis , Osteoarthritis,  OA left knee   Abdominal (+) + obese,   Peds  Hematology Coumadin therapy- last dose 1 week ago   Anesthesia Other Findings   Reproductive/Obstetrics                            Anesthesia Physical Anesthesia Plan  ASA: III  Anesthesia Plan: Spinal   Post-op Pain Management:  Regional for Post-op pain   Induction:   PONV Risk Score and Plan: 2 and Propofol infusion, Ondansetron and Treatment may vary due to age or medical condition  Airway Management Planned: Natural Airway, Nasal Cannula and Simple Face Mask  Additional Equipment:   Intra-op Plan:   Post-operative Plan:   Informed Consent: I have reviewed the patients History and Physical, chart, labs and discussed the procedure including the risks, benefits and alternatives for the proposed anesthesia with the patient or authorized representative who has indicated his/her understanding and acceptance.   Dental advisory  given  Plan Discussed with: CRNA and Surgeon  Anesthesia Plan Comments:        Anesthesia Quick Evaluation

## 2018-09-06 NOTE — Anesthesia Postprocedure Evaluation (Signed)
Anesthesia Post Note  Patient: [name].  Procedure(s) Performed: LEFT TOTAL KNEE ARTHROPLASTY (Left Knee)     Patient location during evaluation: PACU Anesthesia Type: Spinal Level of consciousness: awake and alert and oriented Pain management: pain level controlled Vital Signs Assessment: post-procedure vital signs reviewed and stable Respiratory status: spontaneous breathing, nonlabored ventilation and respiratory function stable Cardiovascular status: blood pressure returned to baseline and stable Postop Assessment: no apparent nausea or vomiting, spinal receding, patient able to bend at knees, no headache and no backache Anesthetic complications: no    Last Vitals:  Vitals:   09/06/18 0816 09/06/18 0817  BP:    Pulse: 74 67  Resp: 12 13  Temp:    SpO2: 98% 100%    Last Pain:  Vitals:   09/06/18 0810  TempSrc:   PainSc: 0-No pain                 Quanta Roher A.

## 2018-09-06 NOTE — Anesthesia Procedure Notes (Signed)
Spinal  Patient location during procedure: OR Start time: 09/06/2018 9:05 AM End time: 09/06/2018 9:09 AM Staffing Resident/CRNA: Niel Hummer, CRNA Performed: resident/CRNA  Preanesthetic Checklist Completed: patient identified, surgical consent, pre-op evaluation, timeout performed, IV checked, risks and benefits discussed and monitors and equipment checked Spinal Block Patient position: sitting Prep: DuraPrep Patient monitoring: heart rate, continuous pulse ox and blood pressure Approach: midline Location: L2-3 Injection technique: single-shot Needle Needle type: Pencan  Needle gauge: 24 G Assessment Sensory level: T6

## 2018-09-06 NOTE — Transfer of Care (Signed)
Immediate Anesthesia Transfer of Care Note  Patient: [name].  Procedure(s) Performed: LEFT TOTAL KNEE ARTHROPLASTY (Left Knee)  Patient Location: PACU  Anesthesia Type:MAC  Level of Consciousness: awake  Airway & Oxygen Therapy: Patient Spontanous Breathing and Patient connected to face mask oxygen  Post-op Assessment: Report given to RN and Post -op Vital signs reviewed and stable  Post vital signs: Reviewed and stable  Last Vitals:  Vitals Value Taken Time  BP    Temp    Pulse 63 09/06/2018 11:05 AM  Resp 12 09/06/2018 11:05 AM  SpO2 95 % 09/06/2018 11:05 AM  Vitals shown include unvalidated device data.  Last Pain:  Vitals:   09/06/18 0810  TempSrc:   PainSc: 0-No pain         Complications: No apparent anesthesia complications

## 2018-09-06 NOTE — Progress Notes (Addendum)
ANTICOAGULATION CONSULT NOTE - Initial Consult  Pharmacy Consult for warfarin Indication: atrial fibrillation  Allergies  Allergen Reactions  . Tramadol Itching    Patient Measurements: Height: 5\' 5"  (165.1 cm) Weight: 182 lb 6 oz (82.7 kg) IBW/kg (Calculated) : 61.5   Vital Signs: Temp: 96.8 F (36 C) (12/26 1104) Temp Source: Oral (12/26 8921) BP: 118/68 (12/26 1115) Pulse Rate: 59 (12/26 1115)  Labs: No results for input(s): HGB, HCT, PLT, APTT, LABPROT, INR, HEPARINUNFRC, HEPRLOWMOCWT, CREATININE, CKTOTAL, CKMB, TROPONINI in the last 72 hours.  Estimated Creatinine Clearance: 41.8 mL/min (A) (by C-G formula based on SCr of 1.28 mg/dL (H)).   Medical History: Past Medical History:  Diagnosis Date  . A-fib ([place])   . Arthritis   . CAD (coronary artery disease)    GXT, neg bruce protocol GXT  . Chronic kidney disease    RENAL INSUFFICIENCY  . Diverticulosis   . ED (erectile dysfunction)   . GERD (gastroesophageal reflux disease)   . Glaucoma   . Gout    no flare in years   . Heart disorder   . Hypertension   . Obesity     Medications:  Scheduled:  . chlorhexidine  60 mL Topical Once  . dexamethasone  10 mg Intravenous Once  . fentaNYL (SUBLIMAZE) injection  50-100 mcg Intravenous UD  . midazolam  1-2 mg Intravenous UD   Infusions:  . lactated ringers 10 mL/hr at 09/06/18 0655  . methocarbamol (ROBAXIN) IV      Assessment: 84 yo s/p L TKA to restart warfarin POD0 per protocol. Note patient was on warfarin prior to admission for hx Afib at a reported dosing regimen of 2.5mg  daily. Last dose of warfarin within last week prior to surgery per PTA med list. Baseline INR 1.22  Goal of Therapy:  INR 2-3   Plan:  1) Warfarin 4mg  today at 1800 2) Daily INR 3) Lovenox 40mg  daily until INR therapeutic   Adrian Saran, PharmD, BCPS 09/06/2018 11:23 AM

## 2018-09-06 NOTE — Anesthesia Procedure Notes (Addendum)
Anesthesia Regional Block: Adductor canal block   Pre-Anesthetic Checklist: ,, timeout performed, Correct Patient, Correct Site, Correct Laterality, Correct Procedure, Correct Position, site marked, Risks and benefits discussed,  Surgical consent,  Pre-op evaluation,  At surgeon's request and post-op pain management  Laterality: Left  Prep: chloraprep       Needles:  Injection technique: Single-shot      Additional Needles:   Procedures:,,,, ultrasound used (permanent image in chart),,,,  Narrative:  Start time: 09/06/2018 8:01 AM End time: 09/06/2018 8:05 AM Injection made incrementally with aspirations every 5 mL.  Performed by: Personally  Anesthesiologist: Josephine Igo, MD  Additional Notes: Timeout performed. Patient sedated. Relevant anatomy ID'd using [place]. Incremental 2-64ml injection of LA with frequent aspiration. Patient tolerated procedure well.        Left Adductor Canal Block

## 2018-09-06 NOTE — Anesthesia Procedure Notes (Signed)
Procedure Name: MAC Date/Time: 09/06/2018 9:03 AM Performed by: Niel Hummer, CRNA Pre-anesthesia Checklist: Patient identified, Emergency Drugs available, Suction available and Patient being monitored Oxygen Delivery Method: Simple face mask

## 2018-09-06 NOTE — Discharge Instructions (Signed)

## 2018-09-06 NOTE — Progress Notes (Signed)
AssistedDr. Foster with left, ultrasound guided, adductor canal block. Side rails up, monitors on throughout procedure. See vital signs in flow sheet. Tolerated Procedure well.  

## 2018-09-06 NOTE — Interval H&P Note (Signed)
History and Physical Interval Note:  09/06/2018 7:04 AM  [name].  has presented today for surgery, with the diagnosis of Left knee osteoarthritis  The various methods of treatment have been discussed with the patient and family. After consideration of risks, benefits and other options for treatment, the patient has consented to  Procedure(s) with comments: LEFT TOTAL KNEE ARTHROPLASTY (Left) - 70 mins as a surgical intervention .  The patient's history has been reviewed, patient examined, no change in status, stable for surgery.  I have reviewed the patient's chart and labs.  Questions were answered to the patient's satisfaction.     Mauri Pole

## 2018-09-06 NOTE — Op Note (Signed)
NAME:  [name].                      MEDICAL RECORD NO.:  [id number]                             FACILITY:  [facility]      PHYSICIAN:  Pietro Cassis. Alvan Dame, M.D.  DATE OF BIRTH:  02/24/1931      DATE OF PROCEDURE:  09/06/2018                                     OPERATIVE REPORT         PREOPERATIVE DIAGNOSIS:  Left knee osteoarthritis.      POSTOPERATIVE DIAGNOSIS:  Left knee osteoarthritis.      FINDINGS:  The patient was noted to have complete loss of cartilage and   bone-on-bone arthritis with associated osteophytes in the medial and patellofemoral compartments of   the knee with a significant synovitis and associated effusion.  The patient had failed months of conservative treatment including medications, injection therapy, activity modification.     PROCEDURE:  Left total knee replacement.      COMPONENTS USED:  DePuy Attune rotating platform posterior stabilized knee   system, a size 5 femur, 7 tibia, size 8 mm PS AOX insert, and 38 anatomic patellar   button.      SURGEON:  Pietro Cassis. Alvan Dame, M.D.      ASSISTANT:  Danae Orleans, PA-C.      ANESTHESIA:  Regional and Spinal.      SPECIMENS:  None.      COMPLICATION:  None.      DRAINS:  None.  EBL: <100cc      TOURNIQUET TIME:   Total Tourniquet Time Documented: Thigh (Left) - 29 minutes Total: Thigh (Left) - 29 minutes  .      The patient was stable to the recovery room.      INDICATION FOR PROCEDURE:  [name]. is a 84 y.o. male patient of   mine.  The patient had been seen, evaluated, and treated for months conservatively in the   office with medication, activity modification, and injections.  The patient had   radiographic changes of bone-on-bone arthritis with endplate sclerosis and osteophytes noted.  Based on the radiographic changes and failed conservative measures, the patient   decided to proceed with definitive treatment, total knee replacement.  Risks of infection, DVT, component failure,  need for revision surgery, neurovascular injury were reviewed in the office setting.  The postop course was reviewed stressing the efforts to maximize post-operative satisfaction and function.  Consent was obtained for benefit of pain   relief.      PROCEDURE IN DETAIL:  The patient was brought to the operative theater.   Once adequate anesthesia, preoperative antibiotics, 2 gm of Ancef,1 gm of Tranexamic Acid, and 10 mg of Decadron administered, the patient was positioned supine with a left thigh tourniquet placed.  The  left lower extremity was prepped and draped in sterile fashion.  A time-   out was performed identifying the patient, planned procedure, and the appropriate extremity.      The left lower extremity was placed in the [facility] leg holder.  The leg was   exsanguinated, tourniquet elevated to 250 mmHg.  A midline incision was  made followed by median parapatellar arthrotomy.  Following initial   exposure, attention was first directed to the patella.  Precut   measurement was noted to be 26 mm.  I resected down to 14 mm and used a   38 anatomic patellar button to restore patellar height as well as cover the cut surface.      The lug holes were drilled and a metal shim was placed to protect the   patella from retractors and saw blade during the procedure.      At this point, attention was now directed to the femur.  The femoral   canal was opened with a drill, irrigated to try to prevent fat emboli.  An   intramedullary rod was passed at 3 degrees valgus, 8 mm of bone was   resected off the distal femur.  Following this resection, the tibia was   subluxated anteriorly.  Using the extramedullary guide, 2 mm of bone was resected off   the proximal medial tibia.  We confirmed the gap would be   stable medially and laterally with a size 6 spacer block as well as confirmed that the tibial cut was perpendicular in the coronal plane, checking with an alignment rod.      Once this was  done, I sized the femur to be a size 5 in the anterior-   posterior dimension, chose a standard component based on medial and   lateral dimension.  The size 5 rotation block was then pinned in   position anterior referenced using the C-clamp to set rotation.  The   anterior, posterior, and  chamfer cuts were made without difficulty nor   notching making certain that I was along the anterior cortex to help   with flexion gap stability.      The final box cut was made off the lateral aspect of distal femur.      At this point, the tibia was sized to be a size 7.  The size 7 tray was   then pinned in position through the medial third of the tubercle,   drilled, and keel punched.  Trial reduction was now carried with a 5 femur,  7 tibia, a size 7 then 8 mm PS insert, and the 38 anatomic patella botton.  The knee was brought to full extension with good flexion stability with the patella   tracking through the trochlea without application of pressure.  Given   all these findings the trial components removed.  Final components were   opened and cement was mixed.  The knee was irrigated with normal saline solution and pulse lavage.  The synovial lining was   then injected with 30 cc of 0.25% Marcaine with epinephrine, 1 cc of Toradol and 30 cc of NS for a total of 61 cc.     Final implants were then cemented onto cleaned and dried cut surfaces of bone with the knee brought to extension with a size 8 mm PS trial insert.      Once the cement had fully cured, excess cement was removed   throughout the knee.  I confirmed that I was satisfied with the range of   motion and stability, and the final size 8 mm PS AOX insert was chosen.  It was   placed into the knee.      The tourniquet had been let down at 29 minutes.  No significant   hemostasis was required.  The extensor mechanism was then reapproximated using #  1 Vicryl and #1 Stratafix sutures with the knee   in flexion.  The   remaining wound was  closed with 2-0 Vicryl and running 4-0 Monocryl.   The knee was cleaned, dried, dressed sterilely using Dermabond and   Aquacel dressing.  The patient was then   brought to recovery room in stable condition, tolerating the procedure   well.   Please note that Physician Assistant, Danae Orleans, PA-C was present for the entirety of the case, and was utilized for pre-operative positioning, peri-operative retractor management, general facilitation of the procedure and for primary wound closure at the end of the case.              Pietro Cassis Alvan Dame, M.D.    09/06/2018 10:29 AM

## 2018-09-07 LAB — BASIC METABOLIC PANEL
Anion gap: 9 (ref 5–15)
BUN: 32 mg/dL — ABNORMAL HIGH (ref 8–23)
CHLORIDE: 107 mmol/L (ref 98–111)
CO2: 26 mmol/L (ref 22–32)
Calcium: 8.9 mg/dL (ref 8.9–10.3)
Creatinine, Ser: 1.54 mg/dL — ABNORMAL HIGH (ref 0.61–1.24)
GFR calc Af Amer: 47 mL/min — ABNORMAL LOW (ref >60)
GFR calc non Af Amer: 41 mL/min — ABNORMAL LOW (ref >60)
GLUCOSE: 167 mg/dL — AB (ref 70–99)
Potassium: 5.2 mmol/L — ABNORMAL HIGH (ref 3.5–5.1)
Sodium: 142 mmol/L (ref 135–145)

## 2018-09-07 LAB — CBC
HCT: 37.9 % — ABNORMAL LOW (ref 39.0–52.0)
HEMOGLOBIN: 11.7 g/dL — AB (ref 13.0–17.0)
MCH: 30.5 pg (ref 26.0–34.0)
MCHC: 30.9 g/dL (ref 30.0–36.0)
MCV: 99 fL (ref 80.0–100.0)
Platelets: 126 10*3/uL — ABNORMAL LOW (ref 150–400)
RBC: 3.83 MIL/uL — ABNORMAL LOW (ref 4.22–5.81)
RDW: 14.2 % (ref 11.5–15.5)
WBC: 12 10*3/uL — ABNORMAL HIGH (ref 4.0–10.5)
nRBC: 0 % (ref 0.0–0.2)

## 2018-09-07 LAB — PROTIME-INR
INR: 1.13
Prothrombin Time: 14.4 seconds (ref 11.4–15.2)

## 2018-09-07 NOTE — Progress Notes (Signed)
Physical Therapy Treatment Patient Details Name: [name]. MRN: [id number] DOB: 1931-07-23 Today's Date: 09/07/2018    History of Present Illness 84 yo male s/p L TKR on 09/06/18. PMH includes R TKR 02/2018, GERD, CAD, afib, CKD III, glaucoma, HLD, HTN, pacemaker 2004.     PT Comments    POD # 1 pm session with daughter present. Assisted with amb a greater distance then practiced stairs.   Pt ready for D/C to home   Follow Up Recommendations  Follow surgeon's recommendation for DC plan and follow-up therapies;Supervision for mobility/OOB(op)     Equipment Recommendations  None recommended by PT    Recommendations for Other Services       Precautions / Restrictions Precautions Precautions: Fall;ICD/Pacemaker Restrictions Weight Bearing Restrictions: No Other Position/Activity Restrictions: WBAT     Mobility  Bed Mobility               General bed mobility comments: OOB in recliner   Transfers Overall transfer level: Needs assistance Equipment used: Rolling walker (2 wheeled) Transfers: Sit to/from Stand Sit to Stand: Supervision;Min guard         General transfer comment: 25% VC on safety with turns and hand placement with stand to sit  Ambulation/Gait Ambulation/Gait assistance: Supervision Gait Distance (Feet): 85 Feet Assistive device: Rolling walker (2 wheeled) Gait Pattern/deviations: Step-to pattern;Decreased stance time - left;Decreased weight shift to left;Antalgic;Trunk flexed Gait velocity: decr    General Gait Details: 25% VC's on safety with proper walker to self distance and turns    Stairs Stairs: Yes Stairs assistance: Min guard;Min assist Stair Management: One rail Right;Step to pattern;Forwards Number of Stairs: 3 General stair comments: with daughter present performed twice due to pt's responce and VC's needed for retention   Wheelchair Mobility    Modified Rankin (Stroke Patients Only)       Balance                                             Cognition Arousal/Alertness: Awake/alert Behavior During Therapy: WFL for tasks assessed/performed Overall Cognitive Status: Within Functional Limits for tasks assessed                                 General Comments: slight impulsive       Exercises      General Comments        Pertinent Vitals/Pain Pain Assessment: 0-10 Pain Score: 2  Pain Location: L knee  Pain Descriptors / Indicators: Aching;Operative site guarding;Discomfort Pain Intervention(s): Monitored during session;Repositioned    Home Living                      Prior Function            PT Goals (current goals can now be found in the care plan section) Progress towards PT goals: Progressing toward goals    Frequency    7X/week      PT Plan Current plan remains appropriate    Co-evaluation              AM-PAC PT "6 Clicks" Mobility   Outcome Measure  Help needed turning from your back to your side while in a flat bed without using bedrails?: A Little Help needed moving from lying on your back to  sitting on the side of a flat bed without using bedrails?: A Little Help needed moving to and from a bed to a chair (including a wheelchair)?: A Little Help needed standing up from a chair using your arms (e.g., wheelchair or bedside chair)?: A Little Help needed to walk in hospital room?: A Little Help needed climbing 3-5 steps with a railing? : A Little 6 Click Score: 18    End of Session Equipment Utilized During Treatment: Gait belt Activity Tolerance: Patient tolerated treatment well Patient left: in chair;with chair alarm set;with call bell/phone within reach;with family/visitor present;with SCD's reapplied Nurse Communication: Mobility status PT Visit Diagnosis: Other abnormalities of gait and mobility (R26.89);Difficulty in walking, not elsewhere classified (R26.2)     Time: 1345-1410 PT Time Calculation  (min) (ACUTE ONLY): 25 min  Charges:  $Gait Training: 8-22 mins $Therapeutic Activity: 8-22 mins                     Rica Koyanagi  PTA Acute  Rehabilitation Services Pager      [phone] Office      [phone]

## 2018-09-07 NOTE — Progress Notes (Signed)
Patient ID: Mark Hooker., male   DOB: 1931-07-06, 84 y.o.   MRN: [id number] Subjective: 1 Day Post-Op Procedure(s) (LRB): LEFT TOTAL KNEE ARTHROPLASTY (Left)    Patient reports pain as mild.  Doing well this am, waiting for breakfast  Objective:   VITALS:   Vitals:   09/07/18 0224 09/07/18 0506  BP: (!) 159/92 137/69  Pulse: 67 66  Resp: 20 20  Temp: (!) 97.5 F (36.4 C) (!) 97.5 F (36.4 C)  SpO2: 100% 100%    Neurovascular intact Incision: dressing C/D/I  LABS Recent Labs    09/06/18 1328 09/07/18 0518  HGB 12.9* 11.7*  HCT 42.1 37.9*  WBC 6.0 12.0*  PLT 137* 126*    Recent Labs    09/06/18 1328 09/07/18 0518  NA  --  142  K  --  5.2*  BUN  --  32*  CREATININE 1.38* 1.54*  GLUCOSE  --  167*    Recent Labs    09/06/18 1224 09/07/18 0518  INR 1.22 1.13     Assessment/Plan: 1 Day Post-Op Procedure(s) (LRB): LEFT TOTAL KNEE ARTHROPLASTY (Left)   Advance diet Up with therapy  Home today after therapy RTC in 2 weeks Outpt PT already set up

## 2018-09-07 NOTE — Progress Notes (Signed)
Physical Therapy Treatment Patient Details Name: [name]. MRN: [id number] DOB: 1930/10/23 Today's Date: 09/07/2018    History of Present Illness 84 yo male s/p L TKR on 09/06/18. PMH includes R TKR 02/2018, GERD, CAD, afib, CKD III, glaucoma, HLD, HTN, pacemaker 2004.     PT Comments    POD # 1 am session Pt present with nausea this morning.  Assisted with amb in hallway then Performed some TE's following HEP handout.  Instructed on proper tech, freq as well as use of ICE.   Pt will need another PT session to address stairs.   Follow Up Recommendations  Follow surgeon's recommendation for DC plan and follow-up therapies;Supervision for mobility/OOB(op)     Equipment Recommendations  None recommended by PT    Recommendations for Other Services       Precautions / Restrictions Precautions Precautions: Fall;ICD/Pacemaker Restrictions Weight Bearing Restrictions: No Other Position/Activity Restrictions: WBAT     Mobility  Bed Mobility               General bed mobility comments: OOB in recliner   Transfers Overall transfer level: Needs assistance Equipment used: Rolling walker (2 wheeled) Transfers: Sit to/from Stand Sit to Stand: Supervision;Min guard         General transfer comment: 25% VC on safety with turns and hand placement with stand to sit  Ambulation/Gait Ambulation/Gait assistance: Supervision Gait Distance (Feet): 75 Feet Assistive device: Rolling walker (2 wheeled) Gait Pattern/deviations: Step-to pattern;Decreased stance time - left;Decreased weight shift to left;Antalgic;Trunk flexed Gait velocity: decr    General Gait Details: 25% VC's on safety with proper walker to self distance and turns    Marine scientist Rankin (Stroke Patients Only)       Balance                                            Cognition Arousal/Alertness: Awake/alert Behavior During  Therapy: WFL for tasks assessed/performed Overall Cognitive Status: Within Functional Limits for tasks assessed                                 General Comments: slight impulsive       Exercises   Total Knee Replacement TE's 10 reps B LE ankle pumps 10 reps towel squeezes 10 reps knee presses 10 reps heel slides  10 reps SAQ's 10 reps SLR's 10 reps ABD Followed by ICE     General Comments        Pertinent Vitals/Pain Pain Assessment: 0-10 Pain Score: 2  Pain Location: L knee  Pain Descriptors / Indicators: Aching;Operative site guarding;Discomfort Pain Intervention(s): Monitored during session;Repositioned    Home Living                      Prior Function            PT Goals (current goals can now be found in the care plan section) Progress towards PT goals: Progressing toward goals    Frequency    7X/week      PT Plan Current plan remains appropriate    Co-evaluation              AM-PAC PT "6 Clicks" Mobility  Outcome Measure  Help needed turning from your back to your side while in a flat bed without using bedrails?: A Little Help needed moving from lying on your back to sitting on the side of a flat bed without using bedrails?: A Little Help needed moving to and from a bed to a chair (including a wheelchair)?: A Little Help needed standing up from a chair using your arms (e.g., wheelchair or bedside chair)?: A Little Help needed to walk in hospital room?: A Little Help needed climbing 3-5 steps with a railing? : A Little 6 Click Score: 18    End of Session Equipment Utilized During Treatment: Gait belt Activity Tolerance: Patient tolerated treatment well Patient left: in chair;with chair alarm set;with call bell/phone within reach;with family/visitor present;with SCD's reapplied Nurse Communication: Mobility status PT Visit Diagnosis: Other abnormalities of gait and mobility (R26.89);Difficulty in walking, not  elsewhere classified (R26.2)     Time: 1100-1130 PT Time Calculation (min) (ACUTE ONLY): 30 min  Charges:  $Gait Training: 8-22 mins $Therapeutic Exercise: 8-22 mins                     Rica Koyanagi  PTA Acute  Rehabilitation Services Pager      [phone] Office      [phone]

## 2018-09-10 ENCOUNTER — Encounter (HOSPITAL_COMMUNITY): Payer: Self-pay | Admitting: Orthopedic Surgery

## 2018-09-13 DIAGNOSIS — M25662 Stiffness of left knee, not elsewhere classified: Secondary | ICD-10-CM | POA: Diagnosis not present

## 2018-09-14 NOTE — Discharge Summary (Signed)
Physician Discharge Summary  Patient ID: [name]. MRN: [id number] DOB/AGE: June 28, 1931 85 y.o.  Admit date: 09/06/2018 Discharge date: 09/07/2018   Procedures:  Procedure(s) (LRB): LEFT TOTAL KNEE ARTHROPLASTY (Left)  Attending Physician:  Dr. Paralee Cancel   Admission Diagnoses:   Left knee primary OA / pain  Discharge Diagnoses:  Principal Problem:   S/P Left TKA  Past Medical History:  Diagnosis Date  . A-fib ([place])   . Arthritis   . CAD (coronary artery disease)    GXT, neg bruce protocol GXT  . Chronic kidney disease    RENAL INSUFFICIENCY  . Diverticulosis   . ED (erectile dysfunction)   . GERD (gastroesophageal reflux disease)   . Glaucoma   . Gout    no flare in years   . Heart disorder   . Hypertension   . Obesity     HPI:    [name]., 85 y.o. male, has a history of pain and functional disability in the left knee due to arthritis and has failed non-surgical conservative treatments for greater than 12 weeks to includeNSAID's and/or analgesics, corticosteriod injections, viscosupplementation injections, use of assistive devices and activity modification.  Onset of symptoms was gradual, starting 2+ years ago with gradually worsening course since that time. The patient noted prior procedures on the knee to include  arthroplasty on the right knee per Dr. Alvan Dame in June 2019.  Patient currently rates pain in the left knee(s) at 7 out of 10 with activity. Patient has worsening of pain with activity and weight bearing, pain that interferes with activities of daily living, pain with passive range of motion, crepitus and joint swelling.  Patient has evidence of periarticular osteophytes and joint space narrowing by imaging studies.  There is no active infection.  Risks, benefits and expectations were discussed with the patient.  Risks including but not limited to the risk of anesthesia, blood clots, nerve damage, blood vessel damage, failure of the prosthesis,  infection and up to and including death.  Patient understand the risks, benefits and expectations and wishes to proceed with surgery.   PCP: [name], MD   Discharged Condition: good  Hospital Course:  Patient underwent the above stated procedure on 09/06/2018. Patient tolerated the procedure well and brought to the recovery room in good condition and subsequently to the floor.  POD #1 BP: 137/69 ; Pulse: 66 ; Temp: 97.5 F (36.4 C) ; Resp: 20 Patient reports pain as mild.  Doing well this am, waiting for breakfast.  Neurovascular intact and incision: dressing C/D/I.   LABS  Basename    HGB     11.7  HCT     37.9    Discharge Exam: General appearance: alert, cooperative and no distress Extremities: Homans sign is negative, no sign of DVT, no edema, redness or tenderness in the calves or thighs and no ulcers, gangrene or trophic changes  Disposition:  Home with follow up in 2 weeks   Follow-up Information    Paralee Cancel, MD. Schedule an appointment as soon as possible for a visit in 2 weeks.   Specialty:  Orthopedic Surgery Contact information: [address] [place] [address] [phone]           Discharge Instructions    Call MD / Call 911   Complete by:  As directed    If you experience chest pain or shortness of breath, CALL 911 and be transported to the hospital emergency room.  If you develope a fever above 101 F, pus (white drainage) or increased drainage or redness at the wound, or calf pain, call your surgeon's office.   Change dressing   Complete by:  As directed    Maintain surgical dressing until follow up in the clinic. If the edges start to pull up, may reinforce with tape. If the dressing is no longer working, may remove and cover with gauze and tape, but must keep the area dry and clean.  Call with any questions or concerns.   Constipation Prevention   Complete by:  As directed    Drink plenty of fluids.  Prune juice may be  helpful.  You may use a stool softener, such as Colace (over the counter) 100 mg twice a day.  Use MiraLax (over the counter) for constipation as needed.   Diet - low sodium heart healthy   Complete by:  As directed    Discharge instructions   Complete by:  As directed    Maintain surgical dressing until follow up in the clinic. If the edges start to pull up, may reinforce with tape. If the dressing is no longer working, may remove and cover with gauze and tape, but must keep the area dry and clean.  Follow up in 2 weeks at [facility]. Call with any questions or concerns.   Increase activity slowly as tolerated   Complete by:  As directed    Weight bearing as tolerated with assist device (walker, cane, etc) as directed, use it as long as suggested by your surgeon or therapist, typically at least 4-6 weeks.   TED hose   Complete by:  As directed    Use stockings (TED hose) for 2 weeks on both leg(s).  You may remove them at night for sleeping.      Allergies as of 09/07/2018      Reactions   Tramadol Itching      Medication List    TAKE these medications   benazepril-hydrochlorthiazide 20-12.5 MG tablet Commonly known as:  LOTENSIN HCT TAKE 1 TABLET BY MOUTH  DAILY   carvedilol 3.125 MG tablet Commonly known as:  COREG TAKE 1 TABLET BY MOUTH TWO  TIMES DAILY What changed:  when to take this   docusate sodium 100 MG capsule Commonly known as:  COLACE Take 1 capsule (100 mg total) by mouth 2 (two) times daily.   doxazosin 4 MG tablet Commonly known as:  CARDURA Take 1 tablet (4 mg total) by mouth at bedtime. Please keep upcoming appt in December with Dr. Lovena Le for future refills. Thank you   enoxaparin 40 MG/0.4ML injection Commonly known as:  LOVENOX Inject 0.4 mLs (40 mg total) into the skin daily. Use along with Coumadin to obtain therapeutic INR. May stop Lovenox once INR >/= 1.8.   ferrous sulfate 325 (65 FE) MG tablet Commonly known as:  FERROUSUL Take 1  tablet (325 mg total) by mouth 3 (three) times daily with meals.   HYDROcodone-acetaminophen 7.5-325 MG tablet Commonly known as:  NORCO Take 1-2 tablets by mouth every 4 (four) hours as needed for moderate pain.   HYDROcodone-acetaminophen 7.5-325 MG tablet Commonly known as:  NORCO Take 1-2 tablets by mouth every 4 (four) hours as needed for moderate pain.   latanoprost 0.005 % ophthalmic solution Commonly known as:  XALATAN Place 1 drop into both eyes at bedtime.   methocarbamol 500 MG tablet Commonly known as:  ROBAXIN Take 1 tablet (500 mg total) by mouth  every 6 (six) hours as needed for muscle spasms.   polyethylene glycol packet Commonly known as:  MIRALAX / GLYCOLAX Take 17 g by mouth 2 (two) times daily.   potassium chloride SA 20 MEQ tablet Commonly known as:  K-DUR,KLOR-CON TAKE ONE-HALF TABLET BY  MOUTH DAILY   PRESERVISION AREDS 2 PO Take 2 capsules by mouth daily.   simvastatin 20 MG tablet Commonly known as:  ZOCOR TAKE 1 TABLET BY MOUTH AT  BEDTIME   Vitamin D3 25 MCG (1000 UT) Caps Take 1,000 Units by mouth daily.   warfarin 5 MG tablet Commonly known as:  COUMADIN Take as directed. If you are unsure how to take this medication, talk to your nurse or doctor. Original instructions:  Take 2.5mg  (1/2 tablet) daily or as directed by coumadin clinic            Discharge Care Instructions  (From admission, onward)         Start     Ordered   09/07/18 0000  Change dressing    Comments:  Maintain surgical dressing until follow up in the clinic. If the edges start to pull up, may reinforce with tape. If the dressing is no longer working, may remove and cover with gauze and tape, but must keep the area dry and clean.  Call with any questions or concerns.   09/07/18 8185           Signed: West Pugh. Dreshawn Hendershott   PA-C  09/14/2018, 7:58 AM

## 2018-09-17 ENCOUNTER — Encounter: Payer: Self-pay | Admitting: Family Medicine

## 2018-09-17 ENCOUNTER — Emergency Department (HOSPITAL_COMMUNITY): Payer: Medicare Other

## 2018-09-17 ENCOUNTER — Other Ambulatory Visit: Payer: Self-pay

## 2018-09-17 ENCOUNTER — Ambulatory Visit (INDEPENDENT_AMBULATORY_CARE_PROVIDER_SITE_OTHER): Payer: Medicare Other | Admitting: Family Medicine

## 2018-09-17 ENCOUNTER — Observation Stay (HOSPITAL_COMMUNITY)
Admission: EM | Admit: 2018-09-17 | Discharge: 2018-09-20 | Disposition: A | Discharge status: Home / Self Care | Payer: Medicare Other | Attending: Internal Medicine | Admitting: Internal Medicine

## 2018-09-17 VITALS — BP 110/72 | HR 88 | Temp 97.8°F | Resp 24 | Wt 183.0 lb

## 2018-09-17 DIAGNOSIS — I517 Cardiomegaly: Secondary | ICD-10-CM | POA: Diagnosis not present

## 2018-09-17 DIAGNOSIS — I2510 Atherosclerotic heart disease of native coronary artery without angina pectoris: Secondary | ICD-10-CM | POA: Diagnosis not present

## 2018-09-17 DIAGNOSIS — Z7901 Long term (current) use of anticoagulants: Secondary | ICD-10-CM | POA: Diagnosis not present

## 2018-09-17 DIAGNOSIS — Z87891 Personal history of nicotine dependence: Secondary | ICD-10-CM | POA: Diagnosis not present

## 2018-09-17 DIAGNOSIS — Z79899 Other long term (current) drug therapy: Secondary | ICD-10-CM | POA: Diagnosis not present

## 2018-09-17 DIAGNOSIS — N183 Chronic kidney disease, stage 3 unspecified: Secondary | ICD-10-CM | POA: Diagnosis present

## 2018-09-17 DIAGNOSIS — R911 Solitary pulmonary nodule: Secondary | ICD-10-CM | POA: Diagnosis not present

## 2018-09-17 DIAGNOSIS — R0902 Hypoxemia: Secondary | ICD-10-CM | POA: Diagnosis not present

## 2018-09-17 DIAGNOSIS — J9601 Acute respiratory failure with hypoxia: Secondary | ICD-10-CM | POA: Diagnosis not present

## 2018-09-17 DIAGNOSIS — R0682 Tachypnea, not elsewhere classified: Secondary | ICD-10-CM

## 2018-09-17 DIAGNOSIS — R0602 Shortness of breath: Secondary | ICD-10-CM | POA: Diagnosis not present

## 2018-09-17 DIAGNOSIS — Z950 Presence of cardiac pacemaker: Secondary | ICD-10-CM | POA: Insufficient documentation

## 2018-09-17 DIAGNOSIS — I10 Essential (primary) hypertension: Secondary | ICD-10-CM | POA: Diagnosis present

## 2018-09-17 DIAGNOSIS — E785 Hyperlipidemia, unspecified: Secondary | ICD-10-CM | POA: Insufficient documentation

## 2018-09-17 DIAGNOSIS — N1831 Chronic kidney disease, stage 3a: Secondary | ICD-10-CM | POA: Diagnosis present

## 2018-09-17 DIAGNOSIS — R0609 Other forms of dyspnea: Secondary | ICD-10-CM | POA: Diagnosis present

## 2018-09-17 DIAGNOSIS — D509 Iron deficiency anemia, unspecified: Secondary | ICD-10-CM | POA: Insufficient documentation

## 2018-09-17 DIAGNOSIS — I4821 Permanent atrial fibrillation: Secondary | ICD-10-CM | POA: Diagnosis present

## 2018-09-17 DIAGNOSIS — I4891 Unspecified atrial fibrillation: Secondary | ICD-10-CM | POA: Diagnosis not present

## 2018-09-17 DIAGNOSIS — I129 Hypertensive chronic kidney disease with stage 1 through stage 4 chronic kidney disease, or unspecified chronic kidney disease: Secondary | ICD-10-CM | POA: Insufficient documentation

## 2018-09-17 DIAGNOSIS — R062 Wheezing: Secondary | ICD-10-CM | POA: Diagnosis not present

## 2018-09-17 DIAGNOSIS — R0600 Dyspnea, unspecified: Secondary | ICD-10-CM | POA: Diagnosis not present

## 2018-09-17 LAB — CBC WITH DIFFERENTIAL/PLATELET
Abs Immature Granulocytes: 0.02 10*3/uL (ref 0.00–0.07)
Basophils Absolute: 0 10*3/uL (ref 0.0–0.1)
Basophils Relative: 0 %
Eosinophils Absolute: 0.4 10*3/uL (ref 0.0–0.5)
Eosinophils Relative: 6 %
HEMATOCRIT: 36 % — AB (ref 39.0–52.0)
Hemoglobin: 11.1 g/dL — ABNORMAL LOW (ref 13.0–17.0)
Immature Granulocytes: 0 %
LYMPHS ABS: 0.8 10*3/uL (ref 0.7–4.0)
LYMPHS PCT: 11 %
MCH: 29.6 pg (ref 26.0–34.0)
MCHC: 30.8 g/dL (ref 30.0–36.0)
MCV: 96 fL (ref 80.0–100.0)
Monocytes Absolute: 0.7 10*3/uL (ref 0.1–1.0)
Monocytes Relative: 10 %
Neutro Abs: 5 10*3/uL (ref 1.7–7.7)
Neutrophils Relative %: 73 %
Platelets: 173 10*3/uL (ref 150–400)
RBC: 3.75 MIL/uL — ABNORMAL LOW (ref 4.22–5.81)
RDW: 14.4 % (ref 11.5–15.5)
WBC: 7 10*3/uL (ref 4.0–10.5)
nRBC: 0 % (ref 0.0–0.2)

## 2018-09-17 LAB — URINALYSIS, ROUTINE W REFLEX MICROSCOPIC
Bilirubin Urine: NEGATIVE
GLUCOSE, UA: NEGATIVE mg/dL
Hgb urine dipstick: NEGATIVE
Ketones, ur: 5 mg/dL — AB
LEUKOCYTES UA: NEGATIVE
Nitrite: NEGATIVE
Protein, ur: NEGATIVE mg/dL
Specific Gravity, Urine: 1.024 (ref 1.005–1.030)
pH: 5 (ref 5.0–8.0)

## 2018-09-17 LAB — COMPREHENSIVE METABOLIC PANEL
ALT: 20 U/L (ref 0–44)
ANION GAP: 7 (ref 5–15)
AST: 35 U/L (ref 15–41)
Albumin: 3.2 g/dL — ABNORMAL LOW (ref 3.5–5.0)
Alkaline Phosphatase: 55 U/L (ref 38–126)
BUN: 24 mg/dL — ABNORMAL HIGH (ref 8–23)
CO2: 27 mmol/L (ref 22–32)
Calcium: 9 mg/dL (ref 8.9–10.3)
Chloride: 108 mmol/L (ref 98–111)
Creatinine, Ser: 1.37 mg/dL — ABNORMAL HIGH (ref 0.61–1.24)
GFR calc non Af Amer: 47 mL/min — ABNORMAL LOW (ref >60)
GFR, EST AFRICAN AMERICAN: 54 mL/min — AB (ref >60)
Glucose, Bld: 145 mg/dL — ABNORMAL HIGH (ref 70–99)
Potassium: 4.5 mmol/L (ref 3.5–5.1)
SODIUM: 142 mmol/L (ref 135–145)
Total Bilirubin: 1.5 mg/dL — ABNORMAL HIGH (ref 0.3–1.2)
Total Protein: 6.3 g/dL — ABNORMAL LOW (ref 6.5–8.1)

## 2018-09-17 LAB — I-STAT CG4 LACTIC ACID, ED: Lactic Acid, Venous: 1.62 mmol/L (ref 0.5–1.9)

## 2018-09-17 LAB — PROTIME-INR
INR: 2.99
Prothrombin Time: 30.6 seconds — ABNORMAL HIGH (ref 11.4–15.2)

## 2018-09-17 MED ORDER — SODIUM CHLORIDE 0.9 % IV BOLUS
500.0000 mL | Freq: Once | INTRAVENOUS | Status: AC
  Administered 2018-09-17: 500 mL via INTRAVENOUS

## 2018-09-17 MED ORDER — IPRATROPIUM BROMIDE 0.02 % IN SOLN
0.5000 mg | Freq: Once | RESPIRATORY_TRACT | Status: AC
  Administered 2018-09-17: 0.5 mg via RESPIRATORY_TRACT
  Filled 2018-09-17: qty 2.5

## 2018-09-17 MED ORDER — IOPAMIDOL (ISOVUE-370) INJECTION 76%
100.0000 mL | Freq: Once | INTRAVENOUS | Status: AC | PRN
  Administered 2018-09-17: 63 mL via INTRAVENOUS

## 2018-09-17 MED ORDER — ALBUTEROL SULFATE (2.5 MG/3ML) 0.083% IN NEBU
5.0000 mg | INHALATION_SOLUTION | Freq: Once | RESPIRATORY_TRACT | Status: AC
  Administered 2018-09-17: 5 mg via RESPIRATORY_TRACT
  Filled 2018-09-17: qty 6

## 2018-09-17 NOTE — ED Provider Notes (Signed)
[facility] EMERGENCY DEPARTMENT Provider Note   CSN: [id number] Arrival date & time: 09/17/18  1646     History   Chief Complaint Chief Complaint  Patient presents with  . Post-op Problem    HPI [name]. is a 85 y.o. male.  Patient with history of chronic kidney disease, atrial fibrillation on coumadin, left knee replacement on 12/26 by Dr. Alvan Dame --presents the emergency department with progressively worsening shortness of breath with wheezing over the past several days.  Patient saw his doctor today.  PCP was worried about pulmonary embolism so he was referred to the emergency department.  Family also concerned about pneumonia.  Patient denies any fevers.  Cough is nonproductive.  He has a tight feeling in his leg but has been performing therapy and has been up on the leg.  He is able to bend at the knee fairly well.  No treatments prior to arrival.     Past Medical History:  Diagnosis Date  . A-fib ([place])   . Arthritis   . CAD (coronary artery disease)    GXT, neg bruce protocol GXT  . Chronic kidney disease    RENAL INSUFFICIENCY  . Diverticulosis   . ED (erectile dysfunction)   . GERD (gastroesophageal reflux disease)   . Glaucoma   . Gout    no flare in years   . Heart disorder   . Hypertension   . Obesity     Patient Active Problem List   Diagnosis Date Noted  . S/P Left TKA 09/06/2018  . S/P right TKA 02/27/2018  . Obesity   . GERD (gastroesophageal reflux disease)   . ED (erectile dysfunction)   . Diverticulosis   . CAD (coronary artery disease)   . A-fib ([place])   . CKD (chronic kidney disease) stage 3, GFR 30-59 ml/min (HCC) 01/13/2017  . Atherosclerosis 12/12/2016  . Arthritis 12/01/2014  . Abnormality of gait 02/05/2013  . Long term (current) use of anticoagulants 12/29/2012  . Glaucoma 07/26/2011  . Hyperlipidemia LDL goal <70 07/26/2011  . Hypertension 07/26/2011  . BRADYCARDIA 12/26/2008  . PACEMAKER-[place]  12/26/2008    Past Surgical History:  Procedure Laterality Date  . CATARACT EXTRACTION, BILATERAL  2018   with lens placement   . PACEMAKER INSERTION  01/09/03  . TONSILLECTOMY    . TOTAL KNEE ARTHROPLASTY Right 02/27/2018   Procedure: RIGHT TOTAL KNEE ARTHROPLASTY;  Surgeon: Paralee Cancel, MD;  Location: [facility] ORS;  Service: Orthopedics;  Laterality: Right;  70 mins  . TOTAL KNEE ARTHROPLASTY Left 09/06/2018   Procedure: LEFT TOTAL KNEE ARTHROPLASTY;  Surgeon: Paralee Cancel, MD;  Location: [facility] ORS;  Service: Orthopedics;  Laterality: Left;  70 mins        Home Medications    Prior to Admission medications   Medication Sig Start Date End Date Taking? Authorizing Provider  acetaminophen (TYLENOL) 500 MG tablet Take 500 mg by mouth every 6 (six) hours as needed (for pain).    Yes [provider]  benazepril-hydrochlorthiazide (LOTENSIN HCT) 20-12.5 MG tablet TAKE 1 TABLET BY MOUTH  DAILY Patient taking differently: Take 1 tablet by mouth at bedtime.  12/21/17  Yes [name], MD  carvedilol (COREG) 3.125 MG tablet TAKE 1 TABLET BY MOUTH TWO  TIMES DAILY Patient taking differently: Take 3.125 mg by mouth 2 (two) times daily with a meal.  09/06/17  Yes Evans Lance, MD  Cholecalciferol (VITAMIN D3) 1000 units CAPS Take 1,000 Units by mouth  daily.   Yes [provider]  doxazosin (CARDURA) 4 MG tablet Take 1 tablet (4 mg total) by mouth at bedtime. Please keep upcoming appt in December with Dr. Lovena Le for future refills. Thank you Patient taking differently: Take 4 mg by mouth at bedtime.  08/16/18  Yes Evans Lance, MD  latanoprost (XALATAN) 0.005 % ophthalmic solution Place 1 drop into both eyes at bedtime.  06/12/17  Yes [provider]  Multiple Vitamins-Minerals (PRESERVISION AREDS 2 PO) Take 2 capsules by mouth daily.    Yes [provider]  potassium chloride SA (K-DUR,KLOR-CON) 20 MEQ tablet TAKE ONE-HALF TABLET BY  MOUTH DAILY Patient taking  differently: Take 10 mEq by mouth daily.  07/23/18  Yes [name], MD  simvastatin (ZOCOR) 20 MG tablet TAKE 1 TABLET BY MOUTH AT  BEDTIME Patient taking differently: Take 20 mg by mouth at bedtime.  07/23/18  Yes [name], MD  warfarin (COUMADIN) 5 MG tablet Take 2.5mg  (1/2 tablet) daily or as directed by coumadin clinic Patient taking differently: Take 2.5 mg by mouth at bedtime.  08/24/18  Yes Lorretta Harp, MD  docusate sodium (COLACE) 100 MG capsule Take 1 capsule (100 mg total) by mouth 2 (two) times daily. Patient not taking: Reported on 09/17/2018 09/06/18   Danae Orleans, PA-C  enoxaparin (LOVENOX) 40 MG/0.4ML injection Inject 0.4 mLs (40 mg total) into the skin daily. Use along with Coumadin to obtain therapeutic INR. May stop Lovenox once INR >/= 1.8. Patient not taking: Reported on 09/17/2018 09/06/18   Danae Orleans, PA-C  ferrous sulfate (FERROUSUL) 325 (65 FE) MG tablet Take 1 tablet (325 mg total) by mouth 3 (three) times daily with meals. Patient not taking: Reported on 09/17/2018 09/06/18   Danae Orleans, PA-C  HYDROcodone-acetaminophen (NORCO) 7.5-325 MG tablet Take 1-2 tablets by mouth every 4 (four) hours as needed for moderate pain. Patient not taking: Reported on 09/17/2018 09/06/18   Danae Orleans, PA-C  methocarbamol (ROBAXIN) 500 MG tablet Take 1 tablet (500 mg total) by mouth every 6 (six) hours as needed for muscle spasms. Patient not taking: Reported on 09/17/2018 09/06/18   Danae Orleans, PA-C  polyethylene glycol (MIRALAX / Floria Raveling) packet Take 17 g by mouth 2 (two) times daily. Patient not taking: Reported on 09/17/2018 09/06/18   Danae Orleans, PA-C    Family History Family History  Problem Relation Age of Onset  . Heart disease Mother   . Hypertension Mother   . Kidney disease Mother   . Stroke Mother   . Heart disease Father     Social History Social History   Tobacco Use  . Smoking status: Former Smoker    Packs/day: 1.00     Years: 10.00    Pack years: 10.00    Last attempt to quit: 09/13/1979    Years since quitting: 39.0  . Smokeless tobacco: Never Used  Substance Use Topics  . Alcohol use: Yes    Comment: Consumes 2-3 glasses of wine per week; now only socially   . Drug use: No     Allergies   Tramadol   Review of Systems Review of Systems  Constitutional: Negative for fever.  HENT: Negative for rhinorrhea and sore throat.   Eyes: Negative for redness.  Respiratory: Positive for cough, shortness of breath and wheezing.   Cardiovascular: Negative for chest pain.  Gastrointestinal: Negative for abdominal pain, diarrhea, nausea and vomiting.  Genitourinary: Negative for dysuria.  Musculoskeletal: Negative for myalgias.  Skin:  Negative for rash.  Neurological: Negative for headaches.     Physical Exam Updated Vital Signs BP 122/88 (BP Location: Right Arm)   Pulse 93   Temp 98.1 F (36.7 C) (Oral)   Resp 16   SpO2 99%   Physical Exam Vitals signs and nursing note reviewed.  Constitutional:      Appearance: He is well-developed.  HENT:     Head: Normocephalic and atraumatic.  Eyes:     General:        Right eye: No discharge.        Left eye: No discharge.     Conjunctiva/sclera: Conjunctivae normal.  Neck:     Musculoskeletal: Normal range of motion and neck supple.  Cardiovascular:     Rate and Rhythm: Regular rhythm.     Heart sounds: Normal heart sounds.     Comments: Mild tachycardia Pulmonary:     Effort: Respiratory distress (mild accessory muscle use) present.     Breath sounds: Wheezing present.  Abdominal:     Palpations: Abdomen is soft.     Tenderness: There is no abdominal tenderness.  Musculoskeletal:     Comments: Mild erythema and ecchymosis around the L knee with dressing in place. Mild tenderness about the distal thigh. He is able to flex and extend the knee without much discomfort.   Skin:    General: Skin is warm and dry.  Neurological:     Mental Status:  He is alert.      ED Treatments / Results  Labs (all labs ordered are listed, but only abnormal results are displayed) Labs Reviewed  COMPREHENSIVE METABOLIC PANEL - Abnormal; Notable for the following components:      Result Value   Glucose, Bld 145 (*)    BUN 24 (*)    Creatinine, Ser 1.37 (*)    Total Protein 6.3 (*)    Albumin 3.2 (*)    Total Bilirubin 1.5 (*)    GFR calc non Af Amer 47 (*)    GFR calc Af Amer 54 (*)    All other components within normal limits  CBC WITH DIFFERENTIAL/PLATELET - Abnormal; Notable for the following components:   RBC 3.75 (*)    Hemoglobin 11.1 (*)    HCT 36.0 (*)    All other components within normal limits  PROTIME-INR - Abnormal; Notable for the following components:   Prothrombin Time 30.6 (*)    All other components within normal limits  URINALYSIS, ROUTINE W REFLEX MICROSCOPIC - Abnormal; Notable for the following components:   Ketones, ur 5 (*)    All other components within normal limits  CULTURE, BLOOD (ROUTINE X 2)  CULTURE, BLOOD (ROUTINE X 2)  I-STAT CG4 LACTIC ACID, ED    EKG EKG Interpretation  Date/Time:  Monday September 17 2018 16:56:55 EST Ventricular Rate:  93 PR Interval:    QRS Duration: 82 QT Interval:  324 QTC Calculation: 402 R Axis:   23 Text Interpretation:  Atrial fibrillation Low voltage QRS Cannot rule out Anterior infarct , age undetermined Abnormal ECG No significant change since last tracing since 2004 Confirmed by Pryor Curia [phone]) on 09/18/2018 12:54:58 AM   Radiology Dg Chest 2 View  Result Date: 09/17/2018 CLINICAL DATA:  Shortness of breath EXAM: CHEST - 2 VIEW COMPARISON:  05/31/2016 FINDINGS: Left-sided pacing device as before. Streaky atelectasis or scar at the left base. No focal consolidation. Borderline cardiomegaly with aortic atherosclerosis. No pneumothorax. IMPRESSION: No active cardiopulmonary disease. Streaky atelectasis or  scar at the left base. Electronically Signed   By: Donavan Foil M.D.   On: 09/17/2018 18:18    Procedures Procedures (including critical care time)  Medications Ordered in ED Medications  sodium chloride 0.9 % bolus 500 mL (0 mLs Intravenous Stopped 09/17/18 2314)  albuterol (PROVENTIL) (2.5 MG/3ML) 0.083% nebulizer solution 5 mg (5 mg Nebulization Given 09/17/18 2217)  ipratropium (ATROVENT) nebulizer solution 0.5 mg (0.5 mg Nebulization Given 09/17/18 2217)  iopamidol (ISOVUE-370) 76 % injection 100 mL (63 mLs Intravenous Contrast Given 09/17/18 2237)  methylPREDNISolone sodium succinate (SOLU-MEDROL) 125 mg/2 mL injection 125 mg (125 mg Intravenous Given 09/18/18 0047)  albuterol (PROVENTIL) (2.5 MG/3ML) 0.083% nebulizer solution 5 mg (5 mg Nebulization Given 09/18/18 0047)  ipratropium (ATROVENT) nebulizer solution 0.5 mg (0.5 mg Nebulization Given 09/18/18 0047)     Initial Impression / Assessment and Plan / ED Course  I have reviewed the triage vital signs and the nursing notes.  Pertinent labs & imaging results that were available during my care of the patient were reviewed by me and considered in my medical decision making (see chart for details).     Patient seen and examined. Work-up initiated. Medications ordered.   Vital signs reviewed and are as follows: BP 122/88 (BP Location: Right Arm)   Pulse 93   Temp 98.1 F (36.7 C) (Oral)   Resp 16   SpO2 99%   12:13 AM reviewed CT results.  Minimal improvement in breathing status after breathing treatment.  Patient continues to wheeze.  He has been ambulated with oxygen saturation in the low 90s, decreasing to the 80s when he reenters the exam room.  He reports feeling very short of breath after ambulating.  Discussed admission versus discharge with oral prednisone and albuterol inhaler with patient and daughter.  They would feel more comfortable with admission at this time.  I think this is reasonable given his appearance.  Additional breathing treatments, Solu-Medrol ordered.    Will  request hospitalist admission.   12:53 AM Spoke with Dr. Blaine Hamper.   Final Clinical Impressions(s) / ED Diagnoses   Final diagnoses:  Dyspnea on exertion  Wheezing  Hypoxia   Admit for symptom improved. No PE on CT. Question aspiration vs bronchiolitis. From a knee standpoint appears to be healing as expected.   ED Discharge Orders    None       Carlisle Cater, Hershal Coria 09/18/18 0055    Carmin Muskrat, MD 09/18/18 2058

## 2018-09-17 NOTE — Progress Notes (Signed)
   Subjective:    Patient ID: [name]., male    DOB: Aug 23, 1931, 85 y.o.   MRN: [id number]  HPI He had knee replacement done on December 26 and states that after that he has had intermittent shortness of breath but no fever, chills, cough or congestion.   Review of Systems     Objective:   Physical Exam Alert and tachypneic.  Lungs show scattered rales.  Exam of the left leg does show swelling of the knee as well as distally but negative Homan's sign.  The leg is slightly warm to palpation. Pulse ox 95      Assessment & Plan:  Tachypnea  Shortness of breath I explained that under the circumstances this is a pulmonary embolus until proven otherwise and encouraged them to take him to the emergency room now.

## 2018-09-17 NOTE — ED Triage Notes (Signed)
Pt is 10 days post op from right knee surgery. Pt states for 1 week he has been getting progressively sob and increased swelling and redness in knee.

## 2018-09-17 NOTE — ED Notes (Signed)
Pt ambulated around the nurses station. Upon re-entering the room his saturation level was 92%. Once he got back in the bed, it had dropped to 86%. After one minute rest it was back up to 96%.

## 2018-09-18 ENCOUNTER — Encounter (HOSPITAL_COMMUNITY): Payer: Self-pay | Admitting: Internal Medicine

## 2018-09-18 DIAGNOSIS — N183 Chronic kidney disease, stage 3 (moderate): Secondary | ICD-10-CM | POA: Diagnosis not present

## 2018-09-18 DIAGNOSIS — Z7901 Long term (current) use of anticoagulants: Secondary | ICD-10-CM

## 2018-09-18 DIAGNOSIS — I10 Essential (primary) hypertension: Secondary | ICD-10-CM | POA: Diagnosis not present

## 2018-09-18 DIAGNOSIS — I4891 Unspecified atrial fibrillation: Secondary | ICD-10-CM

## 2018-09-18 DIAGNOSIS — E785 Hyperlipidemia, unspecified: Secondary | ICD-10-CM

## 2018-09-18 DIAGNOSIS — Z950 Presence of cardiac pacemaker: Secondary | ICD-10-CM | POA: Diagnosis not present

## 2018-09-18 DIAGNOSIS — J9601 Acute respiratory failure with hypoxia: Secondary | ICD-10-CM | POA: Diagnosis present

## 2018-09-18 DIAGNOSIS — D509 Iron deficiency anemia, unspecified: Secondary | ICD-10-CM | POA: Diagnosis present

## 2018-09-18 LAB — INFLUENZA PANEL BY PCR (TYPE A & B)
Influenza A By PCR: NEGATIVE
Influenza B By PCR: NEGATIVE

## 2018-09-18 LAB — PROTIME-INR
INR: 3.42
Prothrombin Time: 34 seconds — ABNORMAL HIGH (ref 11.4–15.2)

## 2018-09-18 LAB — EXPECTORATED SPUTUM ASSESSMENT W GRAM STAIN, RFLX TO RESP C

## 2018-09-18 LAB — HIV ANTIBODY (ROUTINE TESTING W REFLEX): HIV SCREEN 4TH GENERATION: NONREACTIVE

## 2018-09-18 LAB — STREP PNEUMONIAE URINARY ANTIGEN: Strep Pneumo Urinary Antigen: NEGATIVE

## 2018-09-18 MED ORDER — METHYLPREDNISOLONE SODIUM SUCC 125 MG IJ SOLR
125.0000 mg | Freq: Once | INTRAMUSCULAR | Status: AC
  Administered 2018-09-18: 125 mg via INTRAVENOUS
  Filled 2018-09-18: qty 2

## 2018-09-18 MED ORDER — ACETAMINOPHEN 325 MG PO TABS
650.0000 mg | ORAL_TABLET | Freq: Four times a day (QID) | ORAL | Status: DC | PRN
  Administered 2018-09-19 – 2018-09-20 (×2): 650 mg via ORAL
  Filled 2018-09-18 (×2): qty 2

## 2018-09-18 MED ORDER — LATANOPROST 0.005 % OP SOLN
1.0000 [drp] | Freq: Every day | OPHTHALMIC | Status: DC
  Administered 2018-09-18 – 2018-09-19 (×3): 1 [drp] via OPHTHALMIC
  Filled 2018-09-18: qty 2.5

## 2018-09-18 MED ORDER — IPRATROPIUM BROMIDE 0.02 % IN SOLN
0.5000 mg | Freq: Four times a day (QID) | RESPIRATORY_TRACT | Status: DC
  Administered 2018-09-18 (×3): 0.5 mg via RESPIRATORY_TRACT
  Filled 2018-09-18 (×4): qty 2.5

## 2018-09-18 MED ORDER — DOXYCYCLINE HYCLATE 100 MG PO TABS: 100.0000 mg | ORAL_TABLET | Freq: Two times a day (BID) | ORAL | Status: DC

## 2018-09-18 MED ORDER — METRONIDAZOLE 500 MG PO TABS: 500.0000 mg | ORAL_TABLET | Freq: Three times a day (TID) | ORAL | Status: DC

## 2018-09-18 MED ORDER — HYDROCHLOROTHIAZIDE 12.5 MG PO CAPS
12.5000 mg | ORAL_CAPSULE | Freq: Every day | ORAL | Status: DC
  Administered 2018-09-18 – 2018-09-19 (×3): 12.5 mg via ORAL
  Filled 2018-09-18 (×3): qty 1

## 2018-09-18 MED ORDER — LEVALBUTEROL HCL 1.25 MG/0.5ML IN NEBU
1.2500 mg | INHALATION_SOLUTION | Freq: Four times a day (QID) | RESPIRATORY_TRACT | Status: DC
  Administered 2018-09-18 (×4): 1.25 mg via RESPIRATORY_TRACT
  Filled 2018-09-18 (×5): qty 0.5

## 2018-09-18 MED ORDER — METHYLPREDNISOLONE SODIUM SUCC 125 MG IJ SOLR
60.0000 mg | Freq: Three times a day (TID) | INTRAMUSCULAR | Status: DC
  Administered 2018-09-18 – 2018-09-19 (×5): 60 mg via INTRAVENOUS
  Filled 2018-09-18 (×5): qty 2

## 2018-09-18 MED ORDER — WARFARIN SODIUM 2.5 MG PO TABS
2.5000 mg | ORAL_TABLET | Freq: Every day | ORAL | Status: DC
  Filled 2018-09-18: qty 1

## 2018-09-18 MED ORDER — IPRATROPIUM BROMIDE 0.02 % IN SOLN
0.5000 mg | Freq: Once | RESPIRATORY_TRACT | Status: AC
  Administered 2018-09-18: 0.5 mg via RESPIRATORY_TRACT
  Filled 2018-09-18: qty 2.5

## 2018-09-18 MED ORDER — DOXAZOSIN MESYLATE 4 MG PO TABS
4.0000 mg | ORAL_TABLET | Freq: Every day | ORAL | Status: DC
  Administered 2018-09-18 – 2018-09-19 (×3): 4 mg via ORAL
  Filled 2018-09-18: qty 1
  Filled 2018-09-18 (×2): qty 2
  Filled 2018-09-18 (×2): qty 1

## 2018-09-18 MED ORDER — VITAMIN D 25 MCG (1000 UNIT) PO TABS
1000.0000 [IU] | ORAL_TABLET | Freq: Every day | ORAL | Status: DC
  Administered 2018-09-18 – 2018-09-20 (×3): 1000 [IU] via ORAL
  Filled 2018-09-18 (×3): qty 1

## 2018-09-18 MED ORDER — IPRATROPIUM BROMIDE 0.02 % IN SOLN
0.5000 mg | RESPIRATORY_TRACT | Status: DC
  Administered 2018-09-18: 0.5 mg via RESPIRATORY_TRACT
  Filled 2018-09-18: qty 2.5

## 2018-09-18 MED ORDER — SIMVASTATIN 20 MG PO TABS
20.0000 mg | ORAL_TABLET | Freq: Every day | ORAL | Status: DC
  Administered 2018-09-18 – 2018-09-19 (×3): 20 mg via ORAL
  Filled 2018-09-18 (×3): qty 1

## 2018-09-18 MED ORDER — AMOXICILLIN-POT CLAVULANATE 875-125 MG PO TABS
1.0000 | ORAL_TABLET | Freq: Two times a day (BID) | ORAL | Status: DC
  Administered 2018-09-18 – 2018-09-20 (×6): 1 via ORAL
  Filled 2018-09-18 (×6): qty 1

## 2018-09-18 MED ORDER — HYDRALAZINE HCL 20 MG/ML IJ SOLN: 5.0000 mg | INTRAMUSCULAR | Status: DC | PRN

## 2018-09-18 MED ORDER — IPRATROPIUM BROMIDE 0.02 % IN SOLN
0.5000 mg | Freq: Three times a day (TID) | RESPIRATORY_TRACT | Status: DC
  Administered 2018-09-19 – 2018-09-20 (×4): 0.5 mg via RESPIRATORY_TRACT
  Filled 2018-09-18 (×4): qty 2.5

## 2018-09-18 MED ORDER — PROSIGHT PO TABS
1.0000 | ORAL_TABLET | Freq: Every day | ORAL | Status: DC
  Administered 2018-09-18 – 2018-09-20 (×3): 1 via ORAL
  Filled 2018-09-18 (×3): qty 1

## 2018-09-18 MED ORDER — ALBUTEROL SULFATE (2.5 MG/3ML) 0.083% IN NEBU
5.0000 mg | INHALATION_SOLUTION | Freq: Once | RESPIRATORY_TRACT | Status: AC
  Administered 2018-09-18: 5 mg via RESPIRATORY_TRACT
  Filled 2018-09-18: qty 6

## 2018-09-18 MED ORDER — CARVEDILOL 3.125 MG PO TABS
3.1250 mg | ORAL_TABLET | Freq: Two times a day (BID) | ORAL | Status: DC
  Administered 2018-09-18 – 2018-09-20 (×5): 3.125 mg via ORAL
  Filled 2018-09-18 (×5): qty 1

## 2018-09-18 MED ORDER — LEVALBUTEROL HCL 0.63 MG/3ML IN NEBU: 0.6300 mg | INHALATION_SOLUTION | RESPIRATORY_TRACT | Status: DC | PRN

## 2018-09-18 MED ORDER — WARFARIN - PHARMACIST DOSING INPATIENT: Freq: Every day | Status: DC

## 2018-09-18 MED ORDER — LEVALBUTEROL HCL 1.25 MG/0.5ML IN NEBU
1.2500 mg | INHALATION_SOLUTION | Freq: Three times a day (TID) | RESPIRATORY_TRACT | Status: DC
  Administered 2018-09-19 – 2018-09-20 (×4): 1.25 mg via RESPIRATORY_TRACT
  Filled 2018-09-18 (×4): qty 0.5

## 2018-09-18 MED ORDER — BENAZEPRIL-HYDROCHLOROTHIAZIDE 20-12.5 MG PO TABS: 1.0000 | ORAL_TABLET | Freq: Every day | ORAL | Status: DC

## 2018-09-18 MED ORDER — ONDANSETRON HCL 4 MG/2ML IJ SOLN: 4.0000 mg | Freq: Three times a day (TID) | INTRAMUSCULAR | Status: DC | PRN

## 2018-09-18 MED ORDER — FERROUS SULFATE 325 (65 FE) MG PO TABS
325.0000 mg | ORAL_TABLET | Freq: Three times a day (TID) | ORAL | Status: DC
  Administered 2018-09-18 – 2018-09-20 (×8): 325 mg via ORAL
  Filled 2018-09-18 (×9): qty 1

## 2018-09-18 MED ORDER — BENAZEPRIL HCL 20 MG PO TABS
20.0000 mg | ORAL_TABLET | Freq: Every day | ORAL | Status: DC
  Administered 2018-09-18 – 2018-09-19 (×3): 20 mg via ORAL
  Filled 2018-09-18 (×3): qty 1

## 2018-09-18 MED ORDER — ZOLPIDEM TARTRATE 5 MG PO TABS: 5.0000 mg | ORAL_TABLET | Freq: Every evening | ORAL | Status: DC | PRN

## 2018-09-18 NOTE — Progress Notes (Addendum)
Patient admitted after midnight, please see H&P.  Here with acute respiratory failure due to PNA.  In ER, had diffuse wheezing and desaturation with ambulation.  Currently appears to have increased work of breathing with talking.  Will do home O2 study as it appears to drop with ambulation more.  Add on BNP as well.  Flutter valve.  Continue breathing treatments and IV steroids along with abx.  Family and patient deny coughing with eating etc.  Eulogio Bear DO  Late entry:  Patient ambulated in hall with [facility] RN and O2 sats dropped to 88% and HR went to 120 around 3PM

## 2018-09-18 NOTE — Care Management (Addendum)
This is a no charge note  Pending admission per PA, [place]  85 year old man with past medical history of hypertension, hyperlipidemia, GERD, gout, atrial fibrillation on Coumadin, CAD, CKD, iron deficiency anemia, who presents with shortness of breath.  Patient had right knee replacement surgery 10 days ago, developed shortness of breath, which has been going on for almost a week.  He was found to have oxygen desaturation to 86% on room air.  Also has wheezing on auscultation.  CT angiogram is negative for PE, but showed bronchiolitis/bronchiectasis versus aspiration. Patient does not have fever or leukocytosis.  Placed on telemetry bed for observation.   Ivor Costa, MD  [facility] Pager [phone]  If 7PM-7AM, please contact night-coverage [email or web address] Password [facility] 09/18/2018, 12:56 AM

## 2018-09-18 NOTE — Progress Notes (Signed)
ANTICOAGULATION CONSULT NOTE - Initial Consult  Pharmacy Consult for Warfarin  Indication: atrial fibrillation  Allergies  Allergen Reactions  . Tramadol Itching   Patient Measurements: Height: 5\' 5"  (165.1 cm) Weight: 182 lb (82.6 kg) IBW/kg (Calculated) : 61.5  Vital Signs: Temp: 98.5 F (36.9 C) (01/07 0202) Temp Source: Oral (01/07 0202) BP: 151/89 (01/07 0202) Pulse Rate: 95 (01/07 0202)  Labs: Recent Labs    09/17/18 1720  HGB 11.1*  HCT 36.0*  PLT 173  LABPROT 30.6*  INR 2.99  CREATININE 1.37*    Estimated Creatinine Clearance: 39 mL/min (A) (by C-G formula based on SCr of 1.37 mg/dL (H)).   Medical History: Past Medical History:  Diagnosis Date  . A-fib ([place])   . Arthritis   . CAD (coronary artery disease)    GXT, neg bruce protocol GXT  . Chronic kidney disease    RENAL INSUFFICIENCY  . Diverticulosis   . ED (erectile dysfunction)   . GERD (gastroesophageal reflux disease)   . Glaucoma   . Gout    no flare in years   . Heart disorder   . Hypertension   . Obesity     Assessment: 85 y/o M on warfarin PTA for atrial fibrillation, also recent ortho surgery, presented with shortness of breath, CT angio is negative for PE, continuing warfarin, INR is therapeutic at 2.99, Hgb 11.1, warfarin PTA dosing is 2.5 mg daily.   Goal of Therapy:  INR 2-3 Monitor platelets by anticoagulation protocol: Yes   Plan:  Warfarin 2.5 mg daily at 1800 Daily PT/INR Monitor for bleeding  Narda Bonds 09/18/2018,2:11 AM

## 2018-09-18 NOTE — Progress Notes (Signed)
ANTICOAGULATION CONSULT NOTE - Initial Consult  Pharmacy Consult for Warfarin  Indication: atrial fibrillation  Allergies  Allergen Reactions  . Tramadol Itching   Patient Measurements: Height: 5\' 5"  (165.1 cm) Weight: 182 lb (82.6 kg) IBW/kg (Calculated) : 61.5  Vital Signs: Temp: 98.1 F (36.7 C) (01/07 1410) Temp Source: Oral (01/07 1410) BP: 144/81 (01/07 1410) Pulse Rate: 84 (01/07 1410)  Labs: Recent Labs    09/17/18 1720 09/18/18 1327  HGB 11.1*  --   HCT 36.0*  --   PLT 173  --   LABPROT 30.6* 34.0*  INR 2.99 3.42  CREATININE 1.37*  --     Estimated Creatinine Clearance: 39 mL/min (A) (by C-G formula based on SCr of 1.37 mg/dL (H)).   Medical History: Past Medical History:  Diagnosis Date  . A-fib ([place])   . Arthritis   . CAD (coronary artery disease)    GXT, neg bruce protocol GXT  . Chronic kidney disease    RENAL INSUFFICIENCY  . Diverticulosis   . ED (erectile dysfunction)   . GERD (gastroesophageal reflux disease)   . Glaucoma   . Gout    no flare in years   . Heart disorder   . Hypertension   . Obesity     Assessment: 85 y/o M on warfarin PTA for atrial fibrillation, also recent ortho surgery, presented with shortness of breath, CT angio is negative for PE, continuing warfarin, INR today is SUPRAtherapeutic at 3.42, Hgb 11.1, warfarin PTA dosing is 2.5 mg daily. Patient is also on amox/clav which can increase INR with warfarin to a small extent.   Goal of Therapy:  INR 2-3 Monitor platelets by anticoagulation protocol: Yes   Plan:  Hold warfarin tonight Daily PT/INR Monitor for bleeding  Promyse Ardito A. Levada Dy, PharmD, [place] Pager: [phone] Please utilize Amion for appropriate phone number to reach the unit pharmacist ([place])   09/18/2018,2:33 PM

## 2018-09-18 NOTE — H&P (Signed)
History and Physical    [name]. UEK:[id number] DOB: 24-Mar-1931 DOA: 09/17/2018  Referring MD/NP/PA:   PCP: Mark Lung, MD   Patient coming from:  The patient is coming from home.  At baseline, pt is partially dependent for most of ADL.        Chief Complaint: SOB  HPI: [name]. is a 85 y.o. male with medical history significant of hypertension, hyperlipidemia, GERD, gout, atrial fibrillation on Coumadin, CAD, CKD, iron deficiency anemia, who presents with shortness of breath.  Patient had left knee replacement surgery 10 days ago, and developed shortness of breath, which has been going on for almost a week.  Patient states that he has a dry cough, but no chest pain, fever or chills.  No tenderness in calf areas.  Denies nausea vomiting, diarrhea, abdominal pain, symptoms of UTI or unilateral weakness.  He was found to have oxygen desaturation to 86% on room air.  Also has wheezing. Pt still has left leg swelling and some mild leg pain.  ED Course: pt was found to have   WBC 7.0, stable renal function, temperature 98.1, tachycardia, negative chest x-ray. CT angiogram of chest is negative for PE, but showed bronchiolitis/bronchiectasis versus aspiration. Pt is placed on telemetry bed for observation.  CTA of chest showed 1. No pulmonary embolus.  2. Faint tree in bud opacities in the dependent lower lobes with concurrent bronchiectasis. Findings are suggestive of aspiration. Bronchiolitis also considered.  3. Tiny 3 mm right middle lobe nodule, this may simply represent fissural thickening versus true pulmonary nodule.   Review of Systems:   General: no fevers, chills, no body weight gain, has fatigue HEENT: no blurry vision, hearing changes or sore throat Respiratory: has dyspnea, coughing, wheezing CV: no chest pain, no palpitations GI: no nausea, vomiting, abdominal pain, diarrhea, constipation GU: no dysuria, burning on urination, increased urinary frequency,  hematuria  Ext: has left leg edema and pain. Neuro: no unilateral weakness, numbness, or tingling, no vision change or hearing loss Skin: no rash, no skin tear. MSK: No muscle spasm, no deformity, no limitation of range of movement in spin. S/p of left knee replacement. Heme: No easy bruising.  Travel history: No recent long distant travel.  Allergy:  Allergies  Allergen Reactions  . Tramadol Itching    Past Medical History:  Diagnosis Date  . A-fib ([place])   . Arthritis   . CAD (coronary artery disease)    GXT, neg bruce protocol GXT  . Chronic kidney disease    RENAL INSUFFICIENCY  . Diverticulosis   . ED (erectile dysfunction)   . GERD (gastroesophageal reflux disease)   . Glaucoma   . Gout    no flare in years   . Heart disorder   . Hypertension   . Obesity     Past Surgical History:  Procedure Laterality Date  . CATARACT EXTRACTION, BILATERAL  2018   with lens placement   . PACEMAKER INSERTION  01/09/03  . TONSILLECTOMY    . TOTAL KNEE ARTHROPLASTY Right 02/27/2018   Procedure: RIGHT TOTAL KNEE ARTHROPLASTY;  Surgeon: Paralee Cancel, MD;  Location: [facility];  Service: Orthopedics;  Laterality: Right;  70 mins  . TOTAL KNEE ARTHROPLASTY Left 09/06/2018   Procedure: LEFT TOTAL KNEE ARTHROPLASTY;  Surgeon: Paralee Cancel, MD;  Location: [facility];  Service: Orthopedics;  Laterality: Left;  70 mins    Social History:  reports that he quit smoking about 39 years ago. He has  a 10.00 pack-year smoking history. He has never used smokeless tobacco. He reports current alcohol use. He reports that he does not use drugs.  Family History:  Family History  Problem Relation Age of Onset  . Heart disease Mother   . Hypertension Mother   . Kidney disease Mother   . Stroke Mother   . Heart disease Father      Prior to Admission medications   Medication Sig Start Date End Date Taking? Authorizing Provider  acetaminophen (TYLENOL) 500 MG tablet Take 500 mg by mouth every 6 (six)  hours as needed (for pain).    Yes [provider]  benazepril-hydrochlorthiazide (LOTENSIN HCT) 20-12.5 MG tablet TAKE 1 TABLET BY MOUTH  DAILY Patient taking differently: Take 1 tablet by mouth at bedtime.  12/21/17  Yes Mark Lung, MD  carvedilol (COREG) 3.125 MG tablet TAKE 1 TABLET BY MOUTH TWO  TIMES DAILY Patient taking differently: Take 3.125 mg by mouth 2 (two) times daily with a meal.  09/06/17  Yes Evans Lance, MD  Cholecalciferol (VITAMIN D3) 1000 units CAPS Take 1,000 Units by mouth daily.   Yes [provider]  doxazosin (CARDURA) 4 MG tablet Take 1 tablet (4 mg total) by mouth at bedtime. Please keep upcoming appt in December with Dr. Lovena Le for future refills. Thank you Patient taking differently: Take 4 mg by mouth at bedtime.  08/16/18  Yes Evans Lance, MD  latanoprost (XALATAN) 0.005 % ophthalmic solution Place 1 drop into both eyes at bedtime.  06/12/17  Yes [provider]  Multiple Vitamins-Minerals (PRESERVISION AREDS 2 PO) Take 2 capsules by mouth daily.    Yes [provider]  potassium chloride SA (K-DUR,KLOR-CON) 20 MEQ tablet TAKE ONE-HALF TABLET BY  MOUTH DAILY Patient taking differently: Take 10 mEq by mouth daily.  07/23/18  Yes Mark Lung, MD  simvastatin (ZOCOR) 20 MG tablet TAKE 1 TABLET BY MOUTH AT  BEDTIME Patient taking differently: Take 20 mg by mouth at bedtime.  07/23/18  Yes Mark Lung, MD  warfarin (COUMADIN) 5 MG tablet Take 2.5mg  (1/2 tablet) daily or as directed by coumadin clinic Patient taking differently: Take 2.5 mg by mouth at bedtime.  08/24/18  Yes Lorretta Harp, MD  docusate sodium (COLACE) 100 MG capsule Take 1 capsule (100 mg total) by mouth 2 (two) times daily. Patient not taking: Reported on 09/17/2018 09/06/18   Danae Orleans, PA-C  enoxaparin (LOVENOX) 40 MG/0.4ML injection Inject 0.4 mLs (40 mg total) into the skin daily. Use along with Coumadin to obtain therapeutic INR. May stop  Lovenox once INR >/= 1.8. Patient not taking: Reported on 09/17/2018 09/06/18   Danae Orleans, PA-C  ferrous sulfate (FERROUSUL) 325 (65 FE) MG tablet Take 1 tablet (325 mg total) by mouth 3 (three) times daily with meals. Patient not taking: Reported on 09/17/2018 09/06/18   Danae Orleans, PA-C  HYDROcodone-acetaminophen (NORCO) 7.5-325 MG tablet Take 1-2 tablets by mouth every 4 (four) hours as needed for moderate pain. Patient not taking: Reported on 09/17/2018 09/06/18   Danae Orleans, PA-C  methocarbamol (ROBAXIN) 500 MG tablet Take 1 tablet (500 mg total) by mouth every 6 (six) hours as needed for muscle spasms. Patient not taking: Reported on 09/17/2018 09/06/18   Danae Orleans, PA-C  polyethylene glycol (MIRALAX / Floria Raveling) packet Take 17 g by mouth 2 (two) times daily. Patient not taking: Reported on 09/17/2018 09/06/18   Danae Orleans, PA-C    Physical Exam: Vitals:  09/18/18 0100 09/18/18 0202 09/18/18 0347 09/18/18 0554  BP: (!) 150/91 (!) 151/89  138/87  Pulse: 94 95  89  Resp: (!) 24 (!) 23  18  Temp:  98.5 F (36.9 C)  98.5 F (36.9 C)  TempSrc:  Oral  Oral  SpO2: 99% 95% 97% 94%  Weight:      Height:       General: Not in acute distress HEENT:       Eyes: PERRL, EOMI, no scleral icterus.       ENT: No discharge from the ears and nose, no pharynx injection, no tonsillar enlargement.        Neck: No JVD, no bruit, no mass felt. Heme: No neck lymph node enlargement. Cardiac: S1/S2, RRR, No murmurs, No gallops or rubs. Respiratory: Has diffused wheezing and rhonchi bilaterally. GI: Soft, nondistended, nontender, no rebound pain, no organomegaly, BS present. GU: No hematuria Ext: has left leg edema. 2+DP/PT pulse bilaterally. has mild erythema and ecchymosis around the L knee, with dressing in place. Mild tenderness to the distal thigh. Musculoskeletal: No joint deformities, No joint redness or warmth, no limitation of ROM in spin. Skin: No rashes.  Neuro: Alert,  oriented X3, cranial nerves II-XII grossly intact, moves all extremities.  Psych: Patient is not psychotic, no suicidal or hemocidal ideation.  Labs on Admission: I have personally reviewed following labs and imaging studies  CBC: Recent Labs  Lab 09/17/18 1720  WBC 7.0  NEUTROABS 5.0  HGB 11.1*  HCT 36.0*  MCV 96.0  PLT 914   Basic Metabolic Panel: Recent Labs  Lab 09/17/18 1720  NA 142  K 4.5  CL 108  CO2 27  GLUCOSE 145*  BUN 24*  CREATININE 1.37*  CALCIUM 9.0   GFR: Estimated Creatinine Clearance: 39 mL/min (A) (by C-G formula based on SCr of 1.37 mg/dL (H)). Liver Function Tests: Recent Labs  Lab 09/17/18 1720  AST 35  ALT 20  ALKPHOS 55  BILITOT 1.5*  PROT 6.3*  ALBUMIN 3.2*   No results for input(s): LIPASE, AMYLASE in the last 168 hours. No results for input(s): AMMONIA in the last 168 hours. Coagulation Profile: Recent Labs  Lab 09/17/18 1720  INR 2.99   Cardiac Enzymes: No results for input(s): CKTOTAL, CKMB, CKMBINDEX, TROPONINI in the last 168 hours. BNP (last 3 results) No results for input(s): PROBNP in the last 8760 hours. HbA1C: No results for input(s): HGBA1C in the last 72 hours. CBG: No results for input(s): GLUCAP in the last 168 hours. Lipid Profile: No results for input(s): CHOL, HDL, LDLCALC, TRIG, CHOLHDL, LDLDIRECT in the last 72 hours. Thyroid Function Tests: No results for input(s): TSH, T4TOTAL, FREET4, T3FREE, THYROIDAB in the last 72 hours. Anemia Panel: No results for input(s): VITAMINB12, FOLATE, FERRITIN, TIBC, IRON, RETICCTPCT in the last 72 hours. Urine analysis:    Component Value Date/Time   COLORURINE YELLOW 09/17/2018 [place] 09/17/2018 2215   LABSPEC 1.024 09/17/2018 2215   PHURINE 5.0 09/17/2018 [place] 09/17/2018 2215   HGBUR NEGATIVE 09/17/2018 2215   BILIRUBINUR NEGATIVE 09/17/2018 2215   KETONESUR 5 (A) 09/17/2018 2215   PROTEINUR NEGATIVE 09/17/2018 2215    NITRITE NEGATIVE 09/17/2018 2215   LEUKOCYTESUR NEGATIVE 09/17/2018 2215   Sepsis Labs: @LABRCNTIP (procalcitonin:4,lacticidven:4) ) Recent Results (from the past 240 hour(s))  Culture, blood (Routine x 2)     Status: None (Preliminary result)   Collection Time: 09/17/18  9:40 PM  Result Value Ref  Range Status   Specimen Description BLOOD RIGHT ANTECUBITAL  Final   Special Requests   Final    BOTTLES DRAWN AEROBIC AND ANAEROBIC Blood Culture adequate volume   Culture   Final    NO GROWTH < 12 HOURS Performed at [facility], [address] [place], [place] [address]    Report Status PENDING  Incomplete  Culture, blood (Routine x 2)     Status: None (Preliminary result)   Collection Time: 09/17/18  9:45 PM  Result Value Ref Range Status   Specimen Description BLOOD BLOOD RIGHT FOREARM  Final   Special Requests   Final    BOTTLES DRAWN AEROBIC AND ANAEROBIC Blood Culture adequate volume   Culture   Final    NO GROWTH < 12 HOURS Performed at [facility], [place] [address] [place], [place] [address]    Report Status PENDING  Incomplete     Radiological Exams on Admission: Dg Chest 2 View  Result Date: 09/17/2018 CLINICAL DATA:  Shortness of breath EXAM: CHEST - 2 VIEW COMPARISON:  05/31/2016 FINDINGS: Left-sided pacing device as before. Streaky atelectasis or scar at the left base. No focal consolidation. Borderline cardiomegaly with aortic atherosclerosis. No pneumothorax. IMPRESSION: No active cardiopulmonary disease. Streaky atelectasis or scar at the left base. Electronically Signed   By: Donavan Foil M.D.   On: 09/17/2018 18:18   Ct Angio Chest Pe W And/or Wo Contrast  Result Date: 09/17/2018 CLINICAL DATA:  85 year old with worsening shortness of breath. Recent knee surgery. EXAM: CT ANGIOGRAPHY CHEST WITH CONTRAST TECHNIQUE: Multidetector CT imaging of the chest was performed using the standard protocol during bolus administration of intravenous contrast.  Multiplanar CT image reconstructions and MIPs were obtained to evaluate the vascular anatomy. CONTRAST:  20mL ISOVUE-370 IOPAMIDOL (ISOVUE-370) INJECTION 76% COMPARISON:  Radiographs earlier this day. FINDINGS: Cardiovascular: There are no filling defects within the pulmonary arteries to suggest pulmonary embolus. Atherosclerosis of the thoracic aorta without aneurysm. Can not assess for dissection given phase contrast timing tailored for pulmonary embolus evaluation. Single lead left-sided pacemaker in place with lead in the right ventricle. Multi chamber cardiomegaly, with prominent left atrial enlargement. There are coronary artery calcifications. No pericardial effusion. Mediastinum/Nodes: No enlarged mediastinal or hilar lymph nodes. The esophagus is decompressed. No visualized thyroid nodule. Lungs/Pleura: Mild smudgy ground-glass opacities in the dependent lower lobes. Dependent left lower lobe atelectasis. Trace left pleural thickening without frank effusion. Mild bilateral lower lobe bronchiectasis, greater on the right. No pulmonary edema. A 3 mm nodule in the right middle lobe image 80 series 6, may be fissural thickening. Upper Abdomen: No acute findings. Musculoskeletal: Multilevel degenerative change in the spine. There are no acute or suspicious osseous abnormalities. 2 Review of the MIP images confirms the above findings. IMPRESSION: 1. No pulmonary embolus. 2. Faint tree in bud opacities in the dependent lower lobes with concurrent bronchiectasis. Findings are suggestive of aspiration. Bronchiolitis also considered. 3. Tiny 3 mm right middle lobe nodule, this may simply represent fissural thickening versus true pulmonary nodule. No follow-up needed if patient is low-risk. Non-contrast chest CT can be considered in 12 months if patient is high-risk. This recommendation follows the consensus statement: Guidelines for Management of Incidental Pulmonary Nodules Detected on CT Images: From the Fleischner  Society 2017; Radiology 2017; 284:228-243. 4. Multi chamber cardiomegaly. Coronary artery calcifications. Aortic Atherosclerosis (ICD10-I70.0). Electronically Signed   By: Keith Rake M.D.   On: 09/17/2018 22:56     EKG: Independently reviewed.  Atrial fibrillation, T wave inversion in lead III/aVF.  Nonspecific T wave change.  Assessment/Plan Principal Problem:   Acute respiratory failure with hypoxia (HCC) Active Problems:   PACEMAKER-[place]   Hypertension   Long term (current) use of anticoagulants   CKD (chronic kidney disease) stage 3, GFR 30-59 ml/min (HCC)   CAD (coronary artery disease)   A-fib (HCC)   HLD (hyperlipidemia)   Iron deficiency anemia   Acute respiratory failure with hypoxia ([place]): CTA of chest is negative for PE, but showed possible bronchiectasis, aspiration and bronchiolitis  -will place on tele bed for obs -Nebulizers: scheduled Atrovent and prn Xopenex Nebs -Solu-Medrol 60 mg  tid -Augmentin -Mucinex for cough  -Follow up blood culture x2,  Flu pcr  Essential hypertension: -IV Hydralazine prn -Continue home medications: Lotensin-HCTZ, Coreg  Atrial Fibrillation: CHA2DS2-VASc Score is 4, needs oral anticoagulation. Patient is on Coumadin at home. INR is 2.99 on admission. Heart rate is 90-100s -continue coreg  -coumadin per pharmacy  CKD (chronic kidney disease) stage 3, GFR 30-59 ml/min ([place]): Slightly worsening.  Baseline creatinine 1.2.  His creatinine is 1.37, BUN 24 point -Follow-up renal function by BMP  CAD (coronary artery disease): no CP -Continue Zocor, Coreg  Hyperlipidemia: -Zocor  Iron deficiency anemia: -Continue iron supplement  DVT ppx: on coumadin Code Status: Full code Family Communication: None at bed side.  Disposition Plan:  Anticipate discharge back to previous home environment Consults called:  Non e Admission status: Obs / tele   Date of Service 09/18/2018    Ivor Costa [facility] Pager  [phone]  If 7PM-7AM, please contact night-coverage [email or web address] Password [email or web address] 09/18/2018, 7:59 AM

## 2018-09-18 NOTE — Progress Notes (Signed)
Pt ambulated in the hallways with NT; pt ambulated 15ft with walker and tolerated it well; oxygen was in the 90's around 93-95% RA. Pt didn't drop below 90% per NT. Pt back to room in bed with call light within reach. Will continue to closely monitor. Delia Heady RN

## 2018-09-19 DIAGNOSIS — E785 Hyperlipidemia, unspecified: Secondary | ICD-10-CM

## 2018-09-19 DIAGNOSIS — D509 Iron deficiency anemia, unspecified: Secondary | ICD-10-CM

## 2018-09-19 DIAGNOSIS — J9601 Acute respiratory failure with hypoxia: Secondary | ICD-10-CM | POA: Diagnosis not present

## 2018-09-19 DIAGNOSIS — N183 Chronic kidney disease, stage 3 (moderate): Secondary | ICD-10-CM | POA: Diagnosis not present

## 2018-09-19 DIAGNOSIS — Z950 Presence of cardiac pacemaker: Secondary | ICD-10-CM | POA: Diagnosis not present

## 2018-09-19 DIAGNOSIS — I2510 Atherosclerotic heart disease of native coronary artery without angina pectoris: Secondary | ICD-10-CM

## 2018-09-19 DIAGNOSIS — I4891 Unspecified atrial fibrillation: Secondary | ICD-10-CM | POA: Diagnosis not present

## 2018-09-19 LAB — BASIC METABOLIC PANEL
Anion gap: 8 (ref 5–15)
BUN: 24 mg/dL — ABNORMAL HIGH (ref 8–23)
CHLORIDE: 105 mmol/L (ref 98–111)
CO2: 23 mmol/L (ref 22–32)
Calcium: 9 mg/dL (ref 8.9–10.3)
Creatinine, Ser: 1.28 mg/dL — ABNORMAL HIGH (ref 0.61–1.24)
GFR calc Af Amer: 59 mL/min — ABNORMAL LOW (ref >60)
GFR calc non Af Amer: 51 mL/min — ABNORMAL LOW (ref >60)
Glucose, Bld: 146 mg/dL — ABNORMAL HIGH (ref 70–99)
POTASSIUM: 4.4 mmol/L (ref 3.5–5.1)
Sodium: 136 mmol/L (ref 135–145)

## 2018-09-19 LAB — CBC
HCT: 31.1 % — ABNORMAL LOW (ref 39.0–52.0)
Hemoglobin: 10.3 g/dL — ABNORMAL LOW (ref 13.0–17.0)
MCH: 31 pg (ref 26.0–34.0)
MCHC: 33.1 g/dL (ref 30.0–36.0)
MCV: 93.7 fL (ref 80.0–100.0)
Platelets: 182 10*3/uL (ref 150–400)
RBC: 3.32 MIL/uL — ABNORMAL LOW (ref 4.22–5.81)
RDW: 14.3 % (ref 11.5–15.5)
WBC: 13 10*3/uL — ABNORMAL HIGH (ref 4.0–10.5)
nRBC: 0 % (ref 0.0–0.2)

## 2018-09-19 LAB — LEGIONELLA PNEUMOPHILA SEROGP 1 UR AG: L. pneumophila Serogp 1 Ur Ag: NEGATIVE

## 2018-09-19 LAB — PROTIME-INR
INR: 2.86
Prothrombin Time: 29.5 seconds — ABNORMAL HIGH (ref 11.4–15.2)

## 2018-09-19 LAB — BRAIN NATRIURETIC PEPTIDE: B Natriuretic Peptide: 359 pg/mL — ABNORMAL HIGH (ref 0.0–100.0)

## 2018-09-19 LAB — GLUCOSE, CAPILLARY: Glucose-Capillary: 125 mg/dL — ABNORMAL HIGH (ref 70–99)

## 2018-09-19 MED ORDER — WARFARIN SODIUM 2 MG PO TABS
2.0000 mg | ORAL_TABLET | Freq: Once | ORAL | Status: AC
  Administered 2018-09-19: 2 mg via ORAL
  Filled 2018-09-19: qty 1

## 2018-09-19 MED ORDER — PREDNISONE 20 MG PO TABS
40.0000 mg | ORAL_TABLET | Freq: Every day | ORAL | Status: DC
  Administered 2018-09-20: 40 mg via ORAL
  Filled 2018-09-19: qty 2

## 2018-09-19 NOTE — Progress Notes (Signed)
Ambulated patient.  Pulse climbed to 127.  O2 dropped to 93%.

## 2018-09-19 NOTE — Progress Notes (Signed)
Progress Note    [name].  QQV:[id number] DOB: 03/20/1931  DOA: 09/17/2018 PCP: [name], MD    Brief Narrative:   Chief complaint: Shortness of breath  Medical records reviewed and are as summarized below:  [name]. is an 85 y.o. male  With past medical history significant for hypertension, hyperlipidemia, GERD, gout, atrial fibrillation on Coumadin, who presented with shortness of breath.  Assessment/Plan:   Principal Problem:   Acute respiratory failure with hypoxia (HCC) Active Problems:   P[employer]EM[employer]   Hypertension   Long term (current) use of anticoagulants   CKD (chronic kidney disease) stage 3, GFR 30-59 ml/min (HCC)   CAD (coronary artery disease)   A-fib (HCC)   HLD (hyperlipidemia)   Iron deficiency anemia  Acute respiratory failure with hypoxia: Patient noted to be hypoxic with ambulation down to 88% on admission.  CTA negative for PE but showing possible bronchiectasis, aspiration, or bronchiolitis.  Patient able to ambulate today with heart rates jumping up to 120 with ambulation but O2 sats maintained.  Blood cultures remain negative. - Continue scheduled Atrovent and Xopenex and as needed - Start prednisone 40 mg in a.m. and discontinue IV steroid - Continue Augmentin - Mucinex  Atrial Fibrillation: CHA2DS2-VASc Score =4, Pt on anticoagulation of Coumadin INR therapeutic at 2.86. - Continue Coumadin per pharmacy and [facility]  Chronic kidney disease stage: Slightly improved from previous.  Creatinine 1.28. - Continue to monitor  Coronary artery disease - Continue statin  Iron deficiency anemia - Continue ferrous sulfate  Body mass index is 30.29 kg/m.   Family Communication/Anticipated D/C date and plan/Code Status   DVT prophylaxis: Lovenox ordered. Code Status: Full Code.  Family Communication: None at bedside Disposition Plan: Possible discharge home in 2 to 3 days   Medical Consultants:     None.   Anti-Infectives:    None  Subjective:   Patient still reports being significantly short of breath whenever trying to ambulate.  Objective:    Vitals:   09/19/18 0600 09/19/18 1210 09/19/18 1808 09/19/18 2035  BP: 133/82 (!) 122/99 (!) 149/83   Pulse: 78 76 91   Resp: 18 17 18    Temp: 98.3 F (36.8 C) 97.9 F (36.6 C) 98.1 F (36.7 C)   TempSrc: Oral Oral Oral   SpO2: 99% 96% 95% 93%  Weight:      Height:        Intake/Output Summary (Last 24 hours) at 09/19/2018 2053 Last data filed at 09/19/2018 1817 Gross per 24 hour  Intake 720 ml  Output 750 ml  Net -30 ml   Filed Weights   09/17/18 2211  Weight: 82.6 kg    Exam: General: Patient appears to be in some mild respiratory distress Cardiac: Regular rate and rhythm, no murmurs appreciated, no significant lower extremity edema Respiratory: Mildly tachypneic with expiratory wheezes appreciated Abdomen: Nondistended, nontender, positive bowel sounds in all 4 quadrants Musculoskeletal: Left knee healing from recent knee replacement.  Data Reviewed:   I have personally reviewed following labs and imaging studies:  Labs: Labs show the following:   Basic Metabolic Panel: Recent Labs  Lab 09/17/18 1720 09/19/18 0419  NA 142 136  K 4.5 4.4  CL 108 105  CO2 27 23  GLUCOSE 145* 146*  BUN 24* 24*  CREATININE 1.37* 1.28*  CALCIUM 9.0 9.0   GFR Estimated Creatinine Clearance: 41.7 mL/min (A) (by C-G formula based on SCr of 1.28 mg/dL (H)). Liver Function  Tests: Recent Labs  Lab 09/17/18 1720  AST 35  ALT 20  ALKPHOS 55  BILITOT 1.5*  PROT 6.3*  ALBUMIN 3.2*   No results for input(s): LIPASE, AMYLASE in the last 168 hours. No results for input(s): AMMONIA in the last 168 hours. Coagulation profile Recent Labs  Lab 09/17/18 1720 09/18/18 1327 09/19/18 0419  INR 2.99 3.42 2.86    CBC: Recent Labs  Lab 09/17/18 1720 09/19/18 0419  WBC 7.0 13.0*  NEUTROABS 5.0  --   HGB 11.1*  10.3*  HCT 36.0* 31.1*  MCV 96.0 93.7  PLT 173 182   Cardiac Enzymes: No results for input(s): CKTOTAL, CKMB, CKMBINDEX, TROPONINI in the last 168 hours. BNP (last 3 results) No results for input(s): PROBNP in the last 8760 hours. CBG: Recent Labs  Lab 09/19/18 1545  GLUCAP 125*   D-Dimer: No results for input(s): DDIMER in the last 72 hours. Hgb A1c: No results for input(s): HGBA1C in the last 72 hours. Lipid Profile: No results for input(s): CHOL, HDL, LDLCALC, TRIG, CHOLHDL, LDLDIRECT in the last 72 hours. Thyroid function studies: No results for input(s): TSH, T4TOTAL, T3FREE, THYROIDAB in the last 72 hours.  Invalid input(s): FREET3 Anemia work up: No results for input(s): VITAMINB12, FOLATE, FERRITIN, TIBC, IRON, RETICCTPCT in the last 72 hours. Sepsis Labs: Recent Labs  Lab 09/17/18 1716 09/17/18 1720 09/19/18 0419  WBC  --  7.0 13.0*  LATICACIDVEN 1.62  --   --     Microbiology Recent Results (from the past 240 hour(s))  Culture, blood (Routine x 2)     Status: None (Preliminary result)   Collection Time: 09/17/18  9:40 PM  Result Value Ref Range Status   Specimen Description BLOOD RIGHT ANTECUBITAL  Final   Special Requests   Final    BOTTLES DRAWN AEROBIC AND ANAEROBIC Blood Culture adequate volume   Culture   Final    NO GROWTH 2 DAYS Performed at [facility], [address], [place], [place] [address]    Report Status PENDING  Incomplete  Culture, blood (Routine x 2)     Status: None (Preliminary result)   Collection Time: 09/17/18  9:45 PM  Result Value Ref Range Status   Specimen Description BLOOD BLOOD RIGHT FOREARM  Final   Special Requests   Final    BOTTLES DRAWN AEROBIC AND ANAEROBIC Blood Culture adequate volume   Culture   Final    NO GROWTH 2 DAYS Performed at [facility], [place] [address] [place], [place] [address]    Report Status PENDING  Incomplete  Culture, sputum-assessment     Status: None   Collection Time:  09/18/18 10:39 AM  Result Value Ref Range Status   Specimen Description SPUTUM  Final   Special Requests NONE  Final   Sputum evaluation   Final    Sputum specimen not acceptable for testing.  Please recollect.   RESULT CALLED TO, READ BACK BY AND VERIFIED WITH: POOL RN AT 1200 ON [id number] BY SJW Performed at [facility], [place] [address] [place], [place] [address]    Report Status 09/18/2018 FINAL  Final    Procedures and diagnostic studies:  Ct Angio Chest Pe W And/or Wo Contrast  Result Date: 09/17/2018 CLINICAL DATA:  85 year old with worsening shortness of breath. Recent knee surgery. EXAM: CT ANGIOGRAPHY CHEST WITH CONTRAST TECHNIQUE: Multidetector CT imaging of the chest was performed using the standard protocol during bolus administration of intravenous contrast. Multiplanar  CT image reconstructions and MIPs were obtained to evaluate the vascular anatomy. CONTRAST:  51mL ISOVUE-370 IOPAMIDOL (ISOVUE-370) INJECTION 76% COMPARISON:  Radiographs earlier this day. FINDINGS: Cardiovascular: There are no filling defects within the pulmonary arteries to suggest pulmonary embolus. Atherosclerosis of the thoracic aorta without aneurysm. Can not assess for dissection given phase contrast timing tailored for pulmonary embolus evaluation. Single lead left-sided pacemaker in place with lead in the right ventricle. Multi chamber cardiomegaly, with prominent left atrial enlargement. There are coronary artery calcifications. No pericardial effusion. Mediastinum/Nodes: No enlarged mediastinal or hilar lymph nodes. The esophagus is decompressed. No visualized thyroid nodule. Lungs/Pleura: Mild smudgy ground-glass opacities in the dependent lower lobes. Dependent left lower lobe atelectasis. Trace left pleural thickening without frank effusion. Mild bilateral lower lobe bronchiectasis, greater on the right. No pulmonary edema. A 3 mm nodule in the right middle lobe image 80 series 6, may be fissural  thickening. Upper Abdomen: No acute findings. Musculoskeletal: Multilevel degenerative change in the spine. There are no acute or suspicious osseous abnormalities. 2 Review of the MIP images confirms the above findings. IMPRESSION: 1. No pulmonary embolus. 2. Faint tree in bud opacities in the dependent lower lobes with concurrent bronchiectasis. Findings are suggestive of aspiration. Bronchiolitis also considered. 3. Tiny 3 mm right middle lobe nodule, this may simply represent fissural thickening versus true pulmonary nodule. No follow-up needed if patient is low-risk. Non-contrast chest CT can be considered in 12 months if patient is high-risk. This recommendation follows the consensus statement: Guidelines for Management of Incidental Pulmonary Nodules Detected on CT Images: From the Fleischner Society 2017; Radiology 2017; 284:228-243. 4. Multi chamber cardiomegaly. Coronary artery calcifications. Aortic Atherosclerosis (ICD10-I70.0). Electronically Signed   By: Keith Rake M.D.   On: 09/17/2018 22:56    Medications:   . amoxicillin-clavulanate  1 tablet Oral Q12H  . benazepril  20 mg Oral Daily   And  . hydrochlorothiazide  12.5 mg Oral Daily  . carvedilol  3.125 mg Oral BID WC  . cholecalciferol  1,000 Units Oral Daily  . doxazosin  4 mg Oral QHS  . ferrous sulfate  325 mg Oral TID WC  . ipratropium  0.5 mg Nebulization TID  . latanoprost  1 drop Both Eyes QHS  . levalbuterol  1.25 mg Nebulization TID  . methylPREDNISolone (SOLU-MEDROL) injection  60 mg Intravenous Q8H  . multivitamin  1 tablet Oral Daily  . simvastatin  20 mg Oral QHS  . Warfarin - Pharmacist Dosing Inpatient   Does not apply q1800   Continuous Infusions:   LOS: 0 days   Jeanise Durfey A Merrily Tegeler  [facility]   *Please refer to [email or web address], password [email or web address] to get updated schedule on who will round on this patient, as hospitalists switch teams weekly. If 7PM-7AM, please contact night-coverage at [email or web address],  password [email or web address] for any overnight needs.  09/19/2018, 8:53 PM

## 2018-09-19 NOTE — Care Management Obs Status (Signed)
[place] NOTIFICATION   Patient Details  Name: [name]. MRN: [id number] Date of Birth: 12-27-1930   Medicare Observation Status Notification Given:  Yes    Midge Minium RN, BSN, NCM-BC, ACM-RN [phone] 09/19/2018, 11:23 AM

## 2018-09-19 NOTE — Progress Notes (Signed)
ANTICOAGULATION CONSULT NOTE - Initial Consult  Pharmacy Consult for Warfarin  Indication: atrial fibrillation  Allergies  Allergen Reactions  . Tramadol Itching   Patient Measurements: Height: 5\' 5"  (165.1 cm) Weight: 182 lb (82.6 kg) IBW/kg (Calculated) : 61.5  Vital Signs: Temp: 98.3 F (36.8 C) (01/08 0600) Temp Source: Oral (01/08 0600) BP: 133/82 (01/08 0600) Pulse Rate: 78 (01/08 0600)  Labs: Recent Labs    09/17/18 1720 09/18/18 1327 09/19/18 0419  HGB 11.1*  --  10.3*  HCT 36.0*  --  31.1*  PLT 173  --  182  LABPROT 30.6* 34.0* 29.5*  INR 2.99 3.42 2.86  CREATININE 1.37*  --  1.28*    Estimated Creatinine Clearance: 41.7 mL/min (A) (by C-G formula based on SCr of 1.28 mg/dL (H)).   Medical History: Past Medical History:  Diagnosis Date  . A-fib ([place])   . Arthritis   . CAD (coronary artery disease)    GXT, neg bruce protocol GXT  . Chronic kidney disease    RENAL INSUFFICIENCY  . Diverticulosis   . ED (erectile dysfunction)   . GERD (gastroesophageal reflux disease)   . Glaucoma   . Gout    no flare in years   . Heart disorder   . Hypertension   . Obesity     Assessment: 85 y/o M on warfarin PTA for atrial fibrillation, also recent ortho surgery, presented with shortness of breath, CT angio is negative for PE, continuing warfarin, INR today is therapeutic at 2.86, Hgb 11.1, warfarin PTA dosing is 2.5 mg daily. Patient is also on amox/clav which can increase INR with warfarin to a small extent.   Goal of Therapy:  INR 2-3 Monitor platelets by anticoagulation protocol: Yes   Plan:  Warfarin 2mg  PO x 1 tonight.  Daily PT/INR Monitor for bleeding  Bertina Guthridge A. Levada Dy, PharmD, [place] Pager: [phone] Please utilize Amion for appropriate phone number to reach the unit pharmacist ([email or web address])   09/19/2018,10:03 AM

## 2018-09-20 DIAGNOSIS — I4891 Unspecified atrial fibrillation: Secondary | ICD-10-CM | POA: Diagnosis not present

## 2018-09-20 DIAGNOSIS — J9601 Acute respiratory failure with hypoxia: Secondary | ICD-10-CM | POA: Diagnosis not present

## 2018-09-20 DIAGNOSIS — Z950 Presence of cardiac pacemaker: Secondary | ICD-10-CM | POA: Diagnosis not present

## 2018-09-20 DIAGNOSIS — I2510 Atherosclerotic heart disease of native coronary artery without angina pectoris: Secondary | ICD-10-CM | POA: Diagnosis not present

## 2018-09-20 DIAGNOSIS — N183 Chronic kidney disease, stage 3 (moderate): Secondary | ICD-10-CM | POA: Diagnosis not present

## 2018-09-20 DIAGNOSIS — J471 Bronchiectasis with (acute) exacerbation: Secondary | ICD-10-CM

## 2018-09-20 LAB — CBC WITH DIFFERENTIAL/PLATELET
Abs Immature Granulocytes: 0.1 10*3/uL — ABNORMAL HIGH (ref 0.00–0.07)
Basophils Absolute: 0 10*3/uL (ref 0.0–0.1)
Basophils Relative: 0 %
Eosinophils Absolute: 0 10*3/uL (ref 0.0–0.5)
Eosinophils Relative: 0 %
HCT: 32.1 % — ABNORMAL LOW (ref 39.0–52.0)
Hemoglobin: 10.1 g/dL — ABNORMAL LOW (ref 13.0–17.0)
Immature Granulocytes: 1 %
Lymphocytes Relative: 5 %
Lymphs Abs: 0.7 10*3/uL (ref 0.7–4.0)
MCH: 29.4 pg (ref 26.0–34.0)
MCHC: 31.5 g/dL (ref 30.0–36.0)
MCV: 93.3 fL (ref 80.0–100.0)
Monocytes Absolute: 0.5 10*3/uL (ref 0.1–1.0)
Monocytes Relative: 4 %
Neutro Abs: 13.1 10*3/uL — ABNORMAL HIGH (ref 1.7–7.7)
Neutrophils Relative %: 90 %
PLATELETS: 191 10*3/uL (ref 150–400)
RBC: 3.44 MIL/uL — AB (ref 4.22–5.81)
RDW: 14.4 % (ref 11.5–15.5)
WBC: 14.4 10*3/uL — AB (ref 4.0–10.5)
nRBC: 0 % (ref 0.0–0.2)

## 2018-09-20 LAB — BASIC METABOLIC PANEL
Anion gap: 8 (ref 5–15)
BUN: 30 mg/dL — ABNORMAL HIGH (ref 8–23)
CO2: 25 mmol/L (ref 22–32)
Calcium: 8.9 mg/dL (ref 8.9–10.3)
Chloride: 103 mmol/L (ref 98–111)
Creatinine, Ser: 1.35 mg/dL — ABNORMAL HIGH (ref 0.61–1.24)
GFR calc Af Amer: 55 mL/min — ABNORMAL LOW (ref >60)
GFR calc non Af Amer: 48 mL/min — ABNORMAL LOW (ref >60)
Glucose, Bld: 114 mg/dL — ABNORMAL HIGH (ref 70–99)
POTASSIUM: 3.9 mmol/L (ref 3.5–5.1)
Sodium: 136 mmol/L (ref 135–145)

## 2018-09-20 LAB — GLUCOSE, CAPILLARY
Glucose-Capillary: 100 mg/dL — ABNORMAL HIGH (ref 70–99)
Glucose-Capillary: 122 mg/dL — ABNORMAL HIGH (ref 70–99)

## 2018-09-20 LAB — PROTIME-INR
INR: 2.58
Prothrombin Time: 27.3 seconds — ABNORMAL HIGH (ref 11.4–15.2)

## 2018-09-20 MED ORDER — ALBUTEROL SULFATE HFA 108 (90 BASE) MCG/ACT IN AERS: 2.0000 | INHALATION_SPRAY | Freq: Four times a day (QID) | RESPIRATORY_TRACT | 0 refills | Status: DC | PRN

## 2018-09-20 MED ORDER — WARFARIN SODIUM 2.5 MG PO TABS
2.5000 mg | ORAL_TABLET | Freq: Once | ORAL | Status: DC
  Filled 2018-09-20: qty 1

## 2018-09-20 MED ORDER — PREDNISONE 10 MG PO TABS: ORAL_TABLET | ORAL | 0 refills | Status: DC

## 2018-09-20 MED ORDER — AMOXICILLIN-POT CLAVULANATE 875-125 MG PO TABS: 1.0000 | ORAL_TABLET | Freq: Two times a day (BID) | ORAL | 0 refills | Status: DC

## 2018-09-20 NOTE — Discharge Summary (Addendum)
Mark Flett., is a 85 y.o. male  DOB [birth year]  MRN [id number].  Admission date:  09/17/2018  Admitting Physician  Ivor Costa, MD  Discharge Date:  09/20/2018   Primary MD  [name], MD  Recommendations for primary care physician for things to follow:  - Would recommend repeat CBC and BMP and next follow-up visit - May consider need a repeat CT of chest for right 3 mm pulmonary nodule in 12 months.    Admission Diagnosis   Wheezing [R06.2] Dyspnea on exertion [R06.09] Hypoxia [R09.02]   Discharge Diagnosis   Principal Problem:   Acute respiratory failure with hypoxia (HCC) Active Problems:   PACEMAKER-[employer]   Hypertension   Long term (current) use of anticoagulants   CKD (chronic kidney disease) stage 3, GFR 30-59 ml/min (HCC)   CAD (coronary artery disease)   A-fib (HCC)   HLD (hyperlipidemia)   Iron deficiency anemia      Past Medical History:  Diagnosis Date  . A-fib ([place])   . Arthritis   . CAD (coronary artery disease)    GXT, neg bruce protocol GXT  . Chronic kidney disease    RENAL INSUFFICIENCY  . Diverticulosis   . ED (erectile dysfunction)   . GERD (gastroesophageal reflux disease)   . Glaucoma   . Gout    no flare in years   . Heart disorder   . Hypertension   . Obesity     Past Surgical History:  Procedure Laterality Date  . CATARACT EXTRACTION, BILATERAL  2018   with lens placement   . PACEMAKER INSERTION  01/09/03  . TONSILLECTOMY    . TOTAL KNEE ARTHROPLASTY Right 02/27/2018   Procedure: RIGHT TOTAL KNEE ARTHROPLASTY;  Surgeon: Paralee Cancel, MD;  Location: [facility];  Service: Orthopedics;  Laterality: Right;  70 mins  . TOTAL KNEE ARTHROPLASTY Left 09/06/2018   Procedure: LEFT TOTAL KNEE ARTHROPLASTY;  Surgeon: Paralee Cancel, MD;  Location: [facility];  Service: Orthopedics;   Laterality: Left;  70 mins       HPI  from the history and physical done on the day of admission:  Mark Baldwinis a 85 y.o.malewith medical history significant ofhypertension, hyperlipidemia, GERD, gout, atrial fibrillation on Coumadin, CAD, CKD,iron deficiency anemia, who presents with shortness of breath.  Patient hadleftknee replacement surgery 10 days ago, anddeveloped shortness of breath, which has been going on for almost a week.Patient states that he has a dry cough, but no chest pain, fever or chills. No tenderness in calf areas. Denies nausea vomiting, diarrhea, abdominal pain, symptoms of UTI or unilateral weakness. He was found to have oxygen desaturation to 86% on room air. Also has wheezing.Pt still has left leg swelling and some mild leg pain       Hospital Course:   1. Acute respiratory failure with hypoxia 2/2  bronchiectasis with acute excessive: Patient noted to be hypoxic with ambulation down to 88% on admission. CTA negative for pulmonary embolism, but showing possible bronchiectasis vs. Aspiration vs.  bronchiolitis. Patient able to ambulate without desaturation and O2 after hospital day 2.Blood and sputum cultures remain negative.  Patient was given prednisone and nebulized breathing treatments. Treated for the possibility of infection with Augmentin to complete 10-day course along with short steroid taper.   2. Atrial Fibrillation: CHA2DS2-VASc Score =4.Pt onanticoagulationof Coumadin with INR therapeutic on Coumadin during hospitalization managed by pharmacy.  Patient remained on Coumadin 2.5 mg nightly.  3. Chronic kidney disease stage: Stable.  Creatinine appears to range from 1.2-1.3 while in the hospital.  4. Coronary artery disease:Continue statin  5. Iron deficiency anemia:Continue ferrous sulfate  6.  Cardiomegaly with elevated BNP: Patient noted to have cardiomegaly without signs of overt pulmonary edema and BNP was mildly  elevated at 359.  Last echocardiogram in 07/11/2012 which showed EF of 50-55%.  Clinically patient did not appear fluid overloaded.  Consider further outpatient work-up such as echocardiogram if symptoms arise.  7. Lung nodule: Incidental finding on CT angiogram of the chest.  3 mm right middle lobe nodule consider repeat CT scan in 12 months  Body mass index is 30.29 kg/m.    Follow UP   Consults obtained - None  Discharge Condition: Fair  Diet and Activity recommendation: See Discharge Instructions below  Discharge Instructions     Discharge Instructions    Diet - low sodium heart healthy   Complete by:  As directed    Discharge instructions   Complete by:  As directed    - Recommend continuing Mucinex as over-the-counter to help coughing up sputum. - Prescriptions have been sent to your [facility] of Augmentin with prednisone taper - Follow-up with orthopedics as previously recommended   Discharge wound care:   Complete by:  As directed    Wound dressing of left knee as previously advised following knee surgery   Increase activity slowly   Complete by:  As directed         Discharge Medications     Allergies as of 09/20/2018      Reactions   Tramadol Itching      Medication List    STOP taking these medications   docusate sodium 100 MG capsule Commonly known as:  COLACE   enoxaparin 40 MG/0.4ML injection Commonly known as:  LOVENOX   HYDROcodone-acetaminophen 7.5-325 MG tablet Commonly known as:  NORCO   polyethylene glycol packet Commonly known as:  MIRALAX / GLYCOLAX     TAKE these medications   acetaminophen 500 MG tablet Commonly known as:  TYLENOL Take 500 mg by mouth every 6 (six) hours as needed (for pain).   albuterol 108 (90 Base) MCG/ACT inhaler Commonly known as:  PROVENTIL HFA;VENTOLIN HFA Inhale 2 puffs into the lungs every 6 (six) hours as needed for wheezing or shortness of breath.   amoxicillin-clavulanate 875-125 MG  tablet Commonly known as:  AUGMENTIN Take 1 tablet by mouth every 12 (twelve) hours.   benazepril-hydrochlorthiazide 20-12.5 MG tablet Commonly known as:  LOTENSIN HCT TAKE 1 TABLET BY MOUTH  DAILY What changed:  when to take this   carvedilol 3.125 MG tablet Commonly known as:  COREG TAKE 1 TABLET BY MOUTH TWO  TIMES DAILY What changed:  when to take this   doxazosin 4 MG tablet Commonly known as:  CARDURA Take 1 tablet (4 mg total) by mouth at bedtime. Please keep upcoming appt in December with Dr. Lovena Le for future refills. Thank you What changed:  additional instructions   ferrous sulfate 325 (65 FE) MG tablet  Commonly known as:  FERROUSUL Take 1 tablet (325 mg total) by mouth 3 (three) times daily with meals.   latanoprost 0.005 % ophthalmic solution Commonly known as:  XALATAN Place 1 drop into both eyes at bedtime.   methocarbamol 500 MG tablet Commonly known as:  ROBAXIN Take 1 tablet (500 mg total) by mouth every 6 (six) hours as needed for muscle spasms.   potassium chloride SA 20 MEQ tablet Commonly known as:  K-DUR,KLOR-CON TAKE ONE-HALF TABLET BY  MOUTH DAILY   predniSONE 10 MG tablet Commonly known as:  DELTASONE Taper: Take prednisone 30 mg  p.o. x2 days, then take 20 mg p.o. x2 days, then take 10 mg x 2 days then discontinue   PRESERVISION AREDS 2 PO Take 2 capsules by mouth daily.   simvastatin 20 MG tablet Commonly known as:  ZOCOR TAKE 1 TABLET BY MOUTH AT  BEDTIME   Vitamin D3 25 MCG (1000 UT) Caps Take 1,000 Units by mouth daily.   warfarin 5 MG tablet Commonly known as:  COUMADIN Take as directed. If you are unsure how to take this medication, talk to your nurse or doctor. Original instructions:  Take 2.5mg  (1/2 tablet) daily or as directed by coumadin clinic What changed:    how much to take  how to take this  when to take this  additional instructions            Discharge Care Instructions  (From admission, onward)          Start     Ordered   09/20/18 0000  Discharge wound care:    Comments:  Wound dressing of left knee as previously advised following knee surgery   09/20/18 4098          Major procedures and Radiology Reports - PLEASE review detailed and final reports for all details, in brief -    Dg Chest 2 View  Result Date: 09/17/2018 CLINICAL DATA:  Shortness of breath EXAM: CHEST - 2 VIEW COMPARISON:  05/31/2016 FINDINGS: Left-sided pacing device as before. Streaky atelectasis or scar at the left base. No focal consolidation. Borderline cardiomegaly with aortic atherosclerosis. No pneumothorax. IMPRESSION: No active cardiopulmonary disease. Streaky atelectasis or scar at the left base. Electronically Signed   By: Donavan Foil M.D.   On: 09/17/2018 18:18   Ct Angio Chest Pe W And/or Wo Contrast  Result Date: 09/17/2018 CLINICAL DATA:  85 year old with worsening shortness of breath. Recent knee surgery. EXAM: CT ANGIOGRAPHY CHEST WITH CONTRAST TECHNIQUE: Multidetector CT imaging of the chest was performed using the standard protocol during bolus administration of intravenous contrast. Multiplanar CT image reconstructions and MIPs were obtained to evaluate the vascular anatomy. CONTRAST:  44mL ISOVUE-370 IOPAMIDOL (ISOVUE-370) INJECTION 76% COMPARISON:  Radiographs earlier this day. FINDINGS: Cardiovascular: There are no filling defects within the pulmonary arteries to suggest pulmonary embolus. Atherosclerosis of the thoracic aorta without aneurysm. Can not assess for dissection given phase contrast timing tailored for pulmonary embolus evaluation. Single lead left-sided pacemaker in place with lead in the right ventricle. Multi chamber cardiomegaly, with prominent left atrial enlargement. There are coronary artery calcifications. No pericardial effusion. Mediastinum/Nodes: No enlarged mediastinal or hilar lymph nodes. The esophagus is decompressed. No visualized thyroid nodule. Lungs/Pleura: Mild smudgy  ground-glass opacities in the dependent lower lobes. Dependent left lower lobe atelectasis. Trace left pleural thickening without frank effusion. Mild bilateral lower lobe bronchiectasis, greater on the right. No pulmonary edema. A 3 mm nodule in the right middle lobe  image 80 series 6, may be fissural thickening. Upper Abdomen: No acute findings. Musculoskeletal: Multilevel degenerative change in the spine. There are no acute or suspicious osseous abnormalities. 2 Review of the MIP images confirms the above findings. IMPRESSION: 1. No pulmonary embolus. 2. Faint tree in bud opacities in the dependent lower lobes with concurrent bronchiectasis. Findings are suggestive of aspiration. Bronchiolitis also considered. 3. Tiny 3 mm right middle lobe nodule, this may simply represent fissural thickening versus true pulmonary nodule. No follow-up needed if patient is low-risk. Non-contrast chest CT can be considered in 12 months if patient is high-risk. This recommendation follows the consensus statement: Guidelines for Management of Incidental Pulmonary Nodules Detected on CT Images: From the Fleischner Society 2017; Radiology 2017; 284:228-243. 4. Multi chamber cardiomegaly. Coronary artery calcifications. Aortic Atherosclerosis (ICD10-I70.0). Electronically Signed   By: Keith Rake M.D.   On: 09/17/2018 22:56   Dg Foot Complete Left  Result Date: 08/23/2018 CLINICAL DATA:  Left foot pain for 2 days EXAM: LEFT FOOT - COMPLETE 3+ VIEW COMPARISON:  None FINDINGS: Osseous demineralization. Joint space narrowing with subchondral cystic changes at first MTP joint. Remaining joint spaces preserved. No acute fracture, dislocation, or bone destruction. Scattered small vessel vascular calcifications. IMPRESSION: Degenerative changes LEFT first MTP joint. No acute osseous findings. Electronically Signed   By: Lavonia Dana M.D.   On: 08/23/2018 16:04    Micro Results    Recent Results (from the past 240 hour(s))    Culture, blood (Routine x 2)     Status: None (Preliminary result)   Collection Time: 09/17/18  9:40 PM  Result Value Ref Range Status   Specimen Description BLOOD RIGHT ANTECUBITAL  Final   Special Requests   Final    BOTTLES DRAWN AEROBIC AND ANAEROBIC Blood Culture adequate volume   Culture   Final    NO GROWTH 3 DAYS Performed at [facility], [place] [address], [place], [place] [address]    Report Status PENDING  Incomplete  Culture, blood (Routine x 2)     Status: None (Preliminary result)   Collection Time: 09/17/18  9:45 PM  Result Value Ref Range Status   Specimen Description BLOOD BLOOD RIGHT FOREARM  Final   Special Requests   Final    BOTTLES DRAWN AEROBIC AND ANAEROBIC Blood Culture adequate volume   Culture   Final    NO GROWTH 3 DAYS Performed at [facility], [place] [address], [place], [place] [address]    Report Status PENDING  Incomplete  Culture, sputum-assessment     Status: None   Collection Time: 09/18/18 10:39 AM  Result Value Ref Range Status   Specimen Description SPUTUM  Final   Special Requests NONE  Final   Sputum evaluation   Final    Sputum specimen not acceptable for testing.  Please recollect.   RESULT CALLED TO, READ BACK BY AND VERIFIED WITH: POOL RN AT 1200 ON [id number] BY SJW Performed at [facility], [place] [address] [place], [place] [address]    Report Status 09/18/2018 FINAL  Final       Today   Subjective    [name] today has some mild aching of the left knee, but reports that swelling is improved.  Breathing is better and he is able to walk around and is ready to be discharged home.   Objective   Blood pressure (!) 155/91, pulse 80, temperature (!) 97.5 F (36.4 C), temperature source Oral, resp. rate  19, height 5\' 5"  (1.651 m), weight 82.6 kg, SpO2 95 %.   Intake/Output Summary (Last 24 hours) at 09/20/2018 1933 Last data filed at 09/20/2018 0551 Gross per 24 hour  Intake -  Output 720 ml  Net -720 ml     Exam  Constitutional: NAD, calm, comfortable Eyes: PERRL, lids and conjunctivae normal ENMT: Mucous membranes are moist. Posterior pharynx clear of any exudate or lesions.  Neck: normal, supple, no masses, no thyromegaly Respiratory: Normal respiratory effort with some mild expiratory wheeze appreciated and intermittent rhonchi Cardiovascular: Regular rate and rhythm, no murmurs / rubs / gallops. No extremity edema. 2+ pedal pulses. No carotid bruits.  Abdomen: no tenderness, no masses palpated. No hepatosplenomegaly. Bowel sounds positive.  Musculoskeletal: no clubbing / cyanosis.  No erythema noted around left knee. Skin: Surgical wound of the left knee Neurologic: CN 2-12 grossly intact. Sensation intact, DTR normal. Strength 5/5 in all 4.  Psychiatric: Normal judgment and insight. Alert and oriented x 3. Normal mood.    Data Review   CBC w Diff:  Lab Results  Component Value Date   WBC 14.4 (H) 09/20/2018   HGB 10.1 (L) 09/20/2018   HGB 12.2 (L) 04/03/2018   HCT 32.1 (L) 09/20/2018   HCT 39.8 04/03/2018   PLT 191 09/20/2018   PLT 172 04/03/2018   LYMPHOPCT 5 09/20/2018   MONOPCT 4 09/20/2018   EOSPCT 0 09/20/2018   BASOPCT 0 09/20/2018    CMP:  Lab Results  Component Value Date   NA 136 09/20/2018   NA 142 04/03/2018   K 3.9 09/20/2018   CL 103 09/20/2018   CO2 25 09/20/2018   BUN 30 (H) 09/20/2018   BUN 23 04/03/2018   CREATININE 1.35 (H) 09/20/2018   CREATININE 1.45 (H) 01/12/2017   PROT 6.3 (L) 09/17/2018   PROT 6.6 04/03/2018   ALBUMIN 3.2 (L) 09/17/2018   ALBUMIN 4.2 04/03/2018   BILITOT 1.5 (H) 09/17/2018   BILITOT 0.7 04/03/2018   ALKPHOS 55 09/17/2018   AST 35 09/17/2018   ALT 20 09/17/2018  .   Total Time in preparing paper work, data evaluation and todays exam - 35 minutes  Norval Morton M.D on 09/20/2018 at 7:33 PM  [facility]   Office  [phone]

## 2018-09-20 NOTE — Progress Notes (Signed)
ANTICOAGULATION CONSULT NOTE - Initial Consult  Pharmacy Consult for Warfarin  Indication: atrial fibrillation  Allergies  Allergen Reactions  . Tramadol Itching   Patient Measurements: Height: 5\' 5"  (165.1 cm) Weight: 182 lb (82.6 kg) IBW/kg (Calculated) : 61.5  Vital Signs: Temp: 97.5 F (36.4 C) (01/09 0548) Temp Source: Oral (01/09 0548) BP: 155/91 (01/09 0548) Pulse Rate: 80 (01/09 0548)  Labs: Recent Labs    09/17/18 1720 09/18/18 1327 09/19/18 0419 09/20/18 0612  HGB 11.1*  --  10.3* 10.1*  HCT 36.0*  --  31.1* 32.1*  PLT 173  --  182 191  LABPROT 30.6* 34.0* 29.5* 27.3*  INR 2.99 3.42 2.86 2.58  CREATININE 1.37*  --  1.28* 1.35*    Estimated Creatinine Clearance: 39.6 mL/min (A) (by C-G formula based on SCr of 1.35 mg/dL (H)).   Medical History: Past Medical History:  Diagnosis Date  . A-fib ([place])   . Arthritis   . CAD (coronary artery disease)    GXT, neg bruce protocol GXT  . Chronic kidney disease    RENAL INSUFFICIENCY  . Diverticulosis   . ED (erectile dysfunction)   . GERD (gastroesophageal reflux disease)   . Glaucoma   . Gout    no flare in years   . Heart disorder   . Hypertension   . Obesity     Assessment: 85 y/o M on warfarin PTA for atrial fibrillation, also recent ortho surgery, presented with shortness of breath, CT angio is negative for PE, continuing warfarin, INR today is therapeutic at 2.58, Hgb 10.1, warfarin PTA dosing is 2.5 mg daily. Patient is also on amox/clav which can increase INR with warfarin to a small extent.   Goal of Therapy:  INR 2-3 Monitor platelets by anticoagulation protocol: Yes   Plan:  Warfarin 2.5 mg PO x 1 tonight.  Daily PT/INR Monitor for bleeding  Melia Hopes A. Levada Dy, PharmD, [place] Pager: [phone] Please utilize Amion for appropriate phone number to reach the unit pharmacist ([place])   09/20/2018,8:03 AM

## 2018-09-20 NOTE — Evaluation (Signed)
Physical Therapy Evaluation and Discharge Patient Details Name: [name]. MRN: [id number] DOB: Jan 25, 1931 Today's Date: 09/20/2018   History of Present Illness  85 yo male s/p L TKR on 09/06/18. PMH includes R TKR 02/2018, GERD, CAD, afib, CKD III, glaucoma, HLD, HTN, pacemaker 2004.   Clinical Impression  Patient evaluated by Physical Therapy with no further acute PT needs identified. All education has been completed and the patient has no further questions. At the time of PT eval pt was able to perform transfers and ambulation with gross modified independence with RW for support. Pt on RA throughout session and O2 sats remained 93-94% throughout mobility. Pt reports feeling back to baseline of function and is looking forward to resuming PT for recent TKR. See below for any follow-up Physical Therapy or equipment needs. PT is signing off. Thank you for this referral.     Follow Up Recommendations (Resume therapy services for TKR)    Equipment Recommendations  None recommended by PT    Recommendations for Other Services       Precautions / Restrictions Precautions Precautions: Fall Precaution Comments: Has pacemaker; recent L TKA Restrictions Weight Bearing Restrictions: Yes Other Position/Activity Restrictions: WBAT       Mobility  Bed Mobility Overal bed mobility: Modified Independent Bed Mobility: Supine to Sit           General bed mobility comments: No assist required.   Transfers Overall transfer level: Modified independent Equipment used: Rolling walker (2 wheeled) Transfers: Sit to/from Stand           General transfer comment: No assist required. Pt demonstrated proper hand placement on seated surface for safety.   Ambulation/Gait Ambulation/Gait assistance: Modified independent (Device/Increase time) Gait Distance (Feet): 200 Feet Assistive device: Rolling walker (2 wheeled) Gait Pattern/deviations: Trunk flexed;Step-through pattern;Decreased  step length - left Gait velocity: Decreased Gait velocity interpretation: 1.31 - 2.62 ft/sec, indicative of limited community ambulator General Gait Details: VC's for improved posture. Pt was able to ambulate well with RW. Pt was on RA throughout gait training with sats 94% throughout.   Stairs            Wheelchair Mobility    Modified Rankin (Stroke Patients Only)       Balance Overall balance assessment: Mild deficits observed, not formally tested                                           Pertinent Vitals/Pain Pain Assessment: No/denies pain    Home Living Family/patient expects to be discharged to:: Private residence Living Arrangements: Children Available Help at Discharge: Family;Available 24 hours/day Type of Home: House Home Access: Stairs to enter Entrance Stairs-Rails: Right Entrance Stairs-Number of Steps: 3 Home Layout: One level Home Equipment: Shower seat;Hand held shower head;Grab bars - tub/shower;Walker - 2 wheels;Cane - single point;Bedside commode;Toilet riser      Prior Function Level of Independence: Independent with assistive device(s)         Comments: Using a RW for support     Hand Dominance   Dominant Hand: Right    Extremity/Trunk Assessment   Upper Extremity Assessment Upper Extremity Assessment: Overall WFL for tasks assessed    Lower Extremity Assessment Lower Extremity Assessment: LLE deficits/detail LLE Deficits / Details: Decreased strength and AROM consistent with recent total knee arthroplasty LLE Sensation: WNL    Cervical / Trunk Assessment  Cervical / Trunk Assessment: Normal  Communication   Communication: No difficulties  Cognition Arousal/Alertness: Awake/alert Behavior During Therapy: WFL for tasks assessed/performed Overall Cognitive Status: Within Functional Limits for tasks assessed                                        General Comments      Exercises      Assessment/Plan    PT Assessment Patient needs continued PT services  PT Problem List Decreased strength;Pain;Decreased range of motion;Decreased activity tolerance;Decreased knowledge of use of DME;Decreased balance;Decreased mobility       PT Treatment Interventions DME instruction;Therapeutic activities;Gait training;Therapeutic exercise;Patient/family education;Stair training;Balance training    PT Goals (Current goals can be found in the Care Plan section)  Acute Rehab PT Goals Patient Stated Goal: none stated  PT Goal Formulation: All assessment and education complete, DC therapy    Frequency 7X/week   Barriers to discharge        Co-evaluation               AM-PAC PT "6 Clicks" Mobility  Outcome Measure Help needed turning from your back to your side while in a flat bed without using bedrails?: None Help needed moving from lying on your back to sitting on the side of a flat bed without using bedrails?: None Help needed moving to and from a bed to a chair (including a wheelchair)?: None Help needed standing up from a chair using your arms (e.g., wheelchair or bedside chair)?: None Help needed to walk in hospital room?: None Help needed climbing 3-5 steps with a railing? : A Little 6 Click Score: 23    End of Session Equipment Utilized During Treatment: Gait belt Activity Tolerance: Patient tolerated treatment well Patient left: in bed;with call bell/phone within reach(Sitting EOB awaiting d/c) Nurse Communication: Mobility status PT Visit Diagnosis: Other abnormalities of gait and mobility (R26.89);Difficulty in walking, not elsewhere classified (R26.2)    Time: 1416-1430 PT Time Calculation (min) (ACUTE ONLY): 14 min   Charges:   PT Evaluation $PT Eval Low Complexity: 1 Low          Rolinda Roan, PT, DPT Acute Rehabilitation Services Pager: [phone] Office: [phone]   Thelma Comp 09/20/2018, 2:47 PM

## 2018-09-22 LAB — CULTURE, BLOOD (ROUTINE X 2)
CULTURE: NO GROWTH
Culture: NO GROWTH
Special Requests: ADEQUATE
Special Requests: ADEQUATE

## 2018-09-24 ENCOUNTER — Telehealth: Payer: Self-pay | Admitting: Family Medicine

## 2018-09-24 NOTE — Telephone Encounter (Signed)
  Glendell Docker Physical therapist [facility] [phone]  Therapist called he needs clearance letter stating that [name] Can return to therapy post recent acute respiratory failure  Gave verbal for visit today per Dr. Redmond School,  Therapist needs letter faxed to  [phone]

## 2018-09-24 NOTE — Telephone Encounter (Signed)
ok 

## 2018-09-25 ENCOUNTER — Encounter: Payer: Self-pay | Admitting: Family Medicine

## 2018-09-25 NOTE — Telephone Encounter (Signed)
Thanks KH 

## 2018-09-25 NOTE — Telephone Encounter (Signed)
Letter typed and faxed

## 2018-09-26 DIAGNOSIS — M25662 Stiffness of left knee, not elsewhere classified: Secondary | ICD-10-CM | POA: Diagnosis not present

## 2018-10-01 ENCOUNTER — Telehealth: Payer: Self-pay

## 2018-10-01 DIAGNOSIS — M25662 Stiffness of left knee, not elsewhere classified: Secondary | ICD-10-CM | POA: Diagnosis not present

## 2018-10-01 NOTE — Telephone Encounter (Signed)
Called pt daughter to schedule pt overdue inr left msg

## 2018-10-03 ENCOUNTER — Ambulatory Visit (INDEPENDENT_AMBULATORY_CARE_PROVIDER_SITE_OTHER): Payer: Medicare Other | Admitting: Pharmacist Clinician (PhC)/ Clinical Pharmacy Specialist

## 2018-10-03 DIAGNOSIS — Z471 Aftercare following joint replacement surgery: Secondary | ICD-10-CM | POA: Diagnosis not present

## 2018-10-03 DIAGNOSIS — Z96652 Presence of left artificial knee joint: Secondary | ICD-10-CM | POA: Diagnosis not present

## 2018-10-03 DIAGNOSIS — Z7901 Long term (current) use of anticoagulants: Secondary | ICD-10-CM

## 2018-10-03 DIAGNOSIS — M25662 Stiffness of left knee, not elsewhere classified: Secondary | ICD-10-CM | POA: Diagnosis not present

## 2018-10-03 LAB — POCT INR: INR: 6.7 — AB (ref 2.0–3.0)

## 2018-10-03 NOTE — Patient Instructions (Signed)
No warfarin for 3 days, then resume with 1/2 tablet daily.  Repeat INR Monday  Call if you need an earlier appointment after surgery [phone] (Kristin/Raquel)

## 2018-10-04 LAB — CUP PACEART REMOTE DEVICE CHECK
Battery Remaining Longevity: 118 mo
Battery Remaining Percentage: 91 %
Battery Voltage: 2.95 V
Brady Statistic RV Percent Paced: 11 %
Implantable Lead Implant Date: 20040429
Implantable Lead Location: 753860
Lead Channel Impedance Value: 430 Ohm
Lead Channel Pacing Threshold Amplitude: 1.25 V
Lead Channel Pacing Threshold Pulse Width: 0.8 ms
Lead Channel Sensing Intrinsic Amplitude: 3.8 mV
Lead Channel Setting Pacing Amplitude: 2.5 V
Lead Channel Setting Pacing Pulse Width: 0.8 ms
Lead Channel Setting Sensing Sensitivity: 2 mV
MDC IDC PG IMPLANT DT: 20110325
MDC IDC SESS DTM: 20191203153654
Pulse Gen Serial Number: 2313421

## 2018-10-08 ENCOUNTER — Ambulatory Visit (INDEPENDENT_AMBULATORY_CARE_PROVIDER_SITE_OTHER): Payer: Medicare Other | Admitting: Pharmacist Clinician (PhC)/ Clinical Pharmacy Specialist

## 2018-10-08 DIAGNOSIS — I4891 Unspecified atrial fibrillation: Secondary | ICD-10-CM | POA: Diagnosis not present

## 2018-10-08 DIAGNOSIS — M25662 Stiffness of left knee, not elsewhere classified: Secondary | ICD-10-CM | POA: Diagnosis not present

## 2018-10-08 DIAGNOSIS — Z7901 Long term (current) use of anticoagulants: Secondary | ICD-10-CM | POA: Diagnosis not present

## 2018-10-08 LAB — POCT INR: INR: 2.8 (ref 2.0–3.0)

## 2018-10-08 NOTE — Patient Instructions (Signed)
Continue with 1/2 tablet (2.5 mg) daily, repeat INR in 1 week Call [phone] (Kristin/Raquel) on Wednesday once you know what time you have therapy on Monday Feb 3.

## 2018-10-10 DIAGNOSIS — M25662 Stiffness of left knee, not elsewhere classified: Secondary | ICD-10-CM | POA: Diagnosis not present

## 2018-10-17 ENCOUNTER — Other Ambulatory Visit: Payer: Self-pay | Admitting: Internal Medicine

## 2018-10-17 ENCOUNTER — Other Ambulatory Visit: Payer: Self-pay | Admitting: Family Medicine

## 2018-10-23 DIAGNOSIS — M25662 Stiffness of left knee, not elsewhere classified: Secondary | ICD-10-CM | POA: Diagnosis not present

## 2018-10-25 DIAGNOSIS — M25662 Stiffness of left knee, not elsewhere classified: Secondary | ICD-10-CM | POA: Diagnosis not present

## 2018-10-26 ENCOUNTER — Ambulatory Visit (INDEPENDENT_AMBULATORY_CARE_PROVIDER_SITE_OTHER): Payer: Medicare Other | Admitting: *Deleted

## 2018-10-26 DIAGNOSIS — Z7901 Long term (current) use of anticoagulants: Secondary | ICD-10-CM | POA: Diagnosis not present

## 2018-10-26 LAB — POCT INR: INR: 3.4 — AB (ref 2.0–3.0)

## 2018-10-26 NOTE — Patient Instructions (Signed)
Description   Skip today's dose, then Continue with 1/2 tablet (2.5 mg) daily, repeat INR in 10 days.  Call [phone] with any medication changes or concerns.

## 2018-10-29 ENCOUNTER — Ambulatory Visit: Payer: Self-pay | Admitting: Family Medicine

## 2018-11-01 DIAGNOSIS — M25662 Stiffness of left knee, not elsewhere classified: Secondary | ICD-10-CM | POA: Diagnosis not present

## 2018-11-05 ENCOUNTER — Encounter (INDEPENDENT_AMBULATORY_CARE_PROVIDER_SITE_OTHER): Payer: Self-pay

## 2018-11-05 ENCOUNTER — Ambulatory Visit (INDEPENDENT_AMBULATORY_CARE_PROVIDER_SITE_OTHER): Payer: Medicare Other | Admitting: *Deleted

## 2018-11-05 DIAGNOSIS — Z7901 Long term (current) use of anticoagulants: Secondary | ICD-10-CM

## 2018-11-05 LAB — POCT INR: INR: 2.7 (ref 2.0–3.0)

## 2018-11-05 NOTE — Patient Instructions (Signed)
Description    Continue with 1/2 tablet (2.5 mg) daily, repeat INR in 2-3 weeks.   Call [phone] with any medication changes or concerns.

## 2018-11-13 ENCOUNTER — Ambulatory Visit (INDEPENDENT_AMBULATORY_CARE_PROVIDER_SITE_OTHER): Payer: Medicare Other | Admitting: *Deleted

## 2018-11-13 DIAGNOSIS — R55 Syncope and collapse: Secondary | ICD-10-CM | POA: Diagnosis not present

## 2018-11-13 DIAGNOSIS — I4891 Unspecified atrial fibrillation: Secondary | ICD-10-CM

## 2018-11-13 LAB — CUP PACEART REMOTE DEVICE CHECK
Battery Remaining Longevity: 117 mo
Battery Remaining Percentage: 91 %
Battery Voltage: 2.95 V
Date Time Interrogation Session: 20200303184319
Implantable Lead Implant Date: 20040429
Implantable Lead Location: 753860
Implantable Pulse Generator Implant Date: 20110325
Lead Channel Impedance Value: 430 Ohm
Lead Channel Pacing Threshold Amplitude: 1.25 V
Lead Channel Pacing Threshold Pulse Width: 0.9 ms
Lead Channel Sensing Intrinsic Amplitude: 5 mV
Lead Channel Setting Pacing Pulse Width: 0.9 ms
Lead Channel Setting Sensing Sensitivity: 0.7 mV
MDC IDC SET LEADCHNL RV PACING AMPLITUDE: 2.5 V
MDC IDC STAT BRADY RV PERCENT PACED: 3.1 %
Pulse Gen Model: 1110
Pulse Gen Serial Number: 2313421

## 2018-11-20 NOTE — Progress Notes (Signed)
Remote pacemaker transmission.   

## 2018-11-21 DIAGNOSIS — M25662 Stiffness of left knee, not elsewhere classified: Secondary | ICD-10-CM | POA: Diagnosis not present

## 2018-11-21 LAB — CUP PACEART INCLINIC DEVICE CHECK
Date Time Interrogation Session: 20200311112209
Implantable Lead Implant Date: 20040429
Implantable Lead Location: 753860
Implantable Pulse Generator Implant Date: 20110325
Pulse Gen Model: 1110
Pulse Gen Serial Number: 2313421

## 2018-11-23 ENCOUNTER — Other Ambulatory Visit: Payer: Self-pay

## 2018-11-23 ENCOUNTER — Ambulatory Visit (INDEPENDENT_AMBULATORY_CARE_PROVIDER_SITE_OTHER): Payer: Medicare Other | Admitting: *Deleted

## 2018-11-23 DIAGNOSIS — Z7901 Long term (current) use of anticoagulants: Secondary | ICD-10-CM

## 2018-11-23 LAB — POCT INR: INR: 2.2 (ref 2.0–3.0)

## 2018-11-23 NOTE — Patient Instructions (Signed)
Description    Continue with 1/2 tablet (2.5 mg) daily, repeat INR in 4 weeks.   Call [phone] with any medication changes or concerns.

## 2018-11-26 DIAGNOSIS — M25662 Stiffness of left knee, not elsewhere classified: Secondary | ICD-10-CM | POA: Diagnosis not present

## 2018-12-20 ENCOUNTER — Telehealth: Payer: Self-pay

## 2018-12-20 NOTE — Telephone Encounter (Signed)

## 2018-12-21 ENCOUNTER — Other Ambulatory Visit: Payer: Self-pay

## 2018-12-21 ENCOUNTER — Ambulatory Visit (INDEPENDENT_AMBULATORY_CARE_PROVIDER_SITE_OTHER): Payer: Medicare Other | Admitting: Pharmacist

## 2018-12-21 DIAGNOSIS — Z7901 Long term (current) use of anticoagulants: Secondary | ICD-10-CM

## 2018-12-21 LAB — POCT INR: INR: 3.9 — AB (ref 2.0–3.0)

## 2018-12-26 DIAGNOSIS — Z96653 Presence of artificial knee joint, bilateral: Secondary | ICD-10-CM | POA: Diagnosis not present

## 2018-12-26 DIAGNOSIS — Z471 Aftercare following joint replacement surgery: Secondary | ICD-10-CM | POA: Diagnosis not present

## 2019-01-16 ENCOUNTER — Telehealth: Payer: Self-pay

## 2019-01-16 NOTE — Telephone Encounter (Signed)

## 2019-01-18 ENCOUNTER — Other Ambulatory Visit: Payer: Self-pay

## 2019-01-18 ENCOUNTER — Ambulatory Visit (INDEPENDENT_AMBULATORY_CARE_PROVIDER_SITE_OTHER): Payer: Medicare Other | Admitting: Pharmacist

## 2019-01-18 DIAGNOSIS — Z7901 Long term (current) use of anticoagulants: Secondary | ICD-10-CM

## 2019-01-18 LAB — POCT INR: INR: 3 (ref 2.0–3.0)

## 2019-01-28 ENCOUNTER — Other Ambulatory Visit: Payer: Self-pay | Admitting: Family Medicine

## 2019-02-12 ENCOUNTER — Ambulatory Visit (INDEPENDENT_AMBULATORY_CARE_PROVIDER_SITE_OTHER): Payer: Medicare Other | Admitting: *Deleted

## 2019-02-12 DIAGNOSIS — I4891 Unspecified atrial fibrillation: Secondary | ICD-10-CM | POA: Diagnosis not present

## 2019-02-13 LAB — CUP PACEART REMOTE DEVICE CHECK
Battery Remaining Longevity: 103 mo
Battery Remaining Percentage: 81 %
Battery Voltage: 2.93 V
Brady Statistic RV Percent Paced: 14 %
Date Time Interrogation Session: 20200602145403
Implantable Lead Implant Date: 20040429
Implantable Lead Location: 753860
Implantable Pulse Generator Implant Date: 20110325
Lead Channel Impedance Value: 410 Ohm
Lead Channel Pacing Threshold Amplitude: 1.25 V
Lead Channel Pacing Threshold Pulse Width: 0.9 ms
Lead Channel Sensing Intrinsic Amplitude: 4 mV
Lead Channel Setting Pacing Amplitude: 2.5 V
Lead Channel Setting Pacing Pulse Width: 0.9 ms
Lead Channel Setting Sensing Sensitivity: 0.7 mV
Pulse Gen Model: 1110
Pulse Gen Serial Number: 2313421

## 2019-02-14 ENCOUNTER — Telehealth: Payer: Self-pay

## 2019-02-14 NOTE — Telephone Encounter (Signed)

## 2019-02-19 ENCOUNTER — Encounter: Payer: Self-pay | Admitting: Cardiology

## 2019-02-19 NOTE — Progress Notes (Signed)
Remote pacemaker transmission.   

## 2019-02-20 DIAGNOSIS — H353132 Nonexudative age-related macular degeneration, bilateral, intermediate dry stage: Secondary | ICD-10-CM | POA: Diagnosis not present

## 2019-02-20 DIAGNOSIS — Z961 Presence of intraocular lens: Secondary | ICD-10-CM | POA: Diagnosis not present

## 2019-02-20 DIAGNOSIS — H401132 Primary open-angle glaucoma, bilateral, moderate stage: Secondary | ICD-10-CM | POA: Diagnosis not present

## 2019-02-22 ENCOUNTER — Ambulatory Visit (INDEPENDENT_AMBULATORY_CARE_PROVIDER_SITE_OTHER): Payer: Medicare Other | Admitting: Pharmacist Clinician (PhC)/ Clinical Pharmacy Specialist

## 2019-02-22 ENCOUNTER — Other Ambulatory Visit: Payer: Self-pay

## 2019-02-22 DIAGNOSIS — I4891 Unspecified atrial fibrillation: Secondary | ICD-10-CM | POA: Diagnosis not present

## 2019-02-22 DIAGNOSIS — Z7901 Long term (current) use of anticoagulants: Secondary | ICD-10-CM | POA: Diagnosis not present

## 2019-02-22 LAB — POCT INR: INR: 2 (ref 2.0–3.0)

## 2019-03-10 ENCOUNTER — Other Ambulatory Visit: Payer: Self-pay | Admitting: Family Medicine

## 2019-03-10 ENCOUNTER — Other Ambulatory Visit: Payer: Self-pay | Admitting: Cardiovascular Disease

## 2019-03-11 DIAGNOSIS — D044 Carcinoma in situ of skin of scalp and neck: Secondary | ICD-10-CM | POA: Diagnosis not present

## 2019-03-11 DIAGNOSIS — L570 Actinic keratosis: Secondary | ICD-10-CM | POA: Diagnosis not present

## 2019-03-11 DIAGNOSIS — D0439 Carcinoma in situ of skin of other parts of face: Secondary | ICD-10-CM | POA: Diagnosis not present

## 2019-03-21 ENCOUNTER — Ambulatory Visit (INDEPENDENT_AMBULATORY_CARE_PROVIDER_SITE_OTHER): Payer: Medicare Other | Admitting: Family Medicine

## 2019-03-21 ENCOUNTER — Encounter: Payer: Self-pay | Admitting: Family Medicine

## 2019-03-21 ENCOUNTER — Other Ambulatory Visit: Payer: Self-pay

## 2019-03-21 VITALS — BP 120/82 | HR 53 | Temp 98.3°F | Wt 181.6 lb

## 2019-03-21 DIAGNOSIS — N401 Enlarged prostate with lower urinary tract symptoms: Secondary | ICD-10-CM | POA: Insufficient documentation

## 2019-03-21 DIAGNOSIS — Z950 Presence of cardiac pacemaker: Secondary | ICD-10-CM

## 2019-03-21 DIAGNOSIS — Z7901 Long term (current) use of anticoagulants: Secondary | ICD-10-CM

## 2019-03-21 DIAGNOSIS — Z96651 Presence of right artificial knee joint: Secondary | ICD-10-CM

## 2019-03-21 DIAGNOSIS — E785 Hyperlipidemia, unspecified: Secondary | ICD-10-CM

## 2019-03-21 DIAGNOSIS — I2510 Atherosclerotic heart disease of native coronary artery without angina pectoris: Secondary | ICD-10-CM

## 2019-03-21 DIAGNOSIS — R3911 Hesitancy of micturition: Secondary | ICD-10-CM

## 2019-03-21 DIAGNOSIS — R350 Frequency of micturition: Secondary | ICD-10-CM | POA: Insufficient documentation

## 2019-03-21 DIAGNOSIS — I10 Essential (primary) hypertension: Secondary | ICD-10-CM

## 2019-03-21 DIAGNOSIS — Z96652 Presence of left artificial knee joint: Secondary | ICD-10-CM

## 2019-03-21 DIAGNOSIS — M1990 Unspecified osteoarthritis, unspecified site: Secondary | ICD-10-CM

## 2019-03-21 DIAGNOSIS — I4891 Unspecified atrial fibrillation: Secondary | ICD-10-CM

## 2019-03-21 MED ORDER — FINASTERIDE 5 MG PO TABS: 5.0000 mg | ORAL_TABLET | Freq: Every day | ORAL | 3 refills | Status: DC

## 2019-03-21 NOTE — Progress Notes (Signed)
[name]. is a 85 y.o. male who presents for annual wellness visit and follow-up on chronic medical conditions.  He does complain of nocturia of 2 or 3 times per night.  This is been going on and getting worse over the last several months.  He complains of decreased stream, incomplete emptying, hesitancy and urge.  He continues on Cardura.  He has had both knees replaced and is not doing rehab like he should.  He continues to be followed by cardiology for his underlying A. fib as well as management of his Coumadin.  He does see his ophthalmologist regularly and does have glaucoma.  He is having no difficulty with reflux.   Immunizations and Health Maintenance Immunization History  Administered Date(s) Administered  . Influenza Split 07/26/2011, 06/25/2012  . Influenza Whole 08/08/2007, 06/11/2008, 06/01/2010  . Influenza, High Dose Seasonal PF 06/18/2013, 06/06/2014, 05/19/2015, 05/31/2016, 05/16/2017, 06/08/2018  . Pneumococcal Conjugate-13 06/25/2015  . Pneumococcal Polysaccharide-23 01/03/1998, 06/11/2008  . Tdap 09/10/2008, 05/16/2017    Last colonoscopy: 11-18-2008 Last HYQ:MVHQIO Dentist: 2020 Ophtho: 2020 Exercise: walking , rehab  Other doctors caring for patient include: Dr,. Lovena Le cardio, Dr. Alvan Dame ortho  Advanced Directives:yes; copy asked for    Depression screen:  See questionnaire below.     Depression screen [facility] 2/9 03/21/2019 01/12/2017 06/16/2015  Decreased Interest 0 0 0  Down, Depressed, Hopeless 0 0 0  PHQ - 2 Score 0 0 0    Fall Screen: See Questionaire below.  Has had difficulty over the last several months with nocturia going to 3 times per night.  He states that he has hesitancy, decreased stream and incomplete emptying.  Presently he is also taking Cardura. Fall Risk  03/21/2019 01/26/2018 01/12/2017 06/16/2015  Falls in the past year? 1 No No No  Injury with Fall? 0 - - -    ADL screen:  See questionnaire below.  Functional Status Survey: Is the patient  deaf or have difficulty hearing?: No Does the patient have difficulty seeing, even when wearing glasses/contacts?: No Does the patient have difficulty concentrating, remembering, or making decisions?: No Does the patient have difficulty walking or climbing stairs?: Yes(knee replacement) Does the patient have difficulty dressing or bathing?: No Does the patient have difficulty doing errands alone such as visiting a doctor's office or shopping?: No   Review of Systems  Constitutional: -, -unexpected weight change, -anorexia, -fatigue Allergy: -sneezing, -itching, -congestion Dermatology: denies changing moles, rash, lumps ENT: -runny nose, -ear pain, -sore throat,  Cardiology:  -chest pain, -palpitations, -orthopnea, Respiratory: -cough, -shortness of breath, -dyspnea on exertion, -wheezing,  Gastroenterology: -abdominal pain, -nausea, -vomiting, -diarrhea, -constipation, -dysphagia Hematology: -bleeding or bruising problems Musculoskeletal: -arthralgias, -myalgias, -joint swelling, -back pain, - Ophthalmology: -vision changes,  Urology: -dysuria, -difficulty urinating,  -urinary frequency, -urgency, incontinence Neurology: -, -numbness, , -memory loss, -falls, -dizziness    PHYSICAL EXAM:  BP 120/82 (BP Location: Left Arm, Patient Position: Sitting)   Pulse (!) 53   Temp 98.3 F (36.8 C)   Wt 181 lb 9.6 oz (82.4 kg)   SpO2 96%   BMI 30.22 kg/m   General Appearance: Alert, cooperative, no distress, appears stated age Head: Normocephalic, without obvious abnormality, atraumatic Eyes: PERRL, conjunctiva/corneas clear, EOM's intact, fundi benign Ears: Normal TM's and external ear canals Nose: Nares normal, mucosa normal, no drainage or sinus   tenderness Throat: Lips, mucosa, and tongue normal; teeth and gums normal Neck: Supple, no lymphadenopathy, thyroid:no enlargement/tenderness/nodules; no carotid bruit or JVD Lungs: Clear  to auscultation bilaterally without wheezes, rales  or ronchi; respirations unlabored Heart: Irregular rate , S1 and S2 normal, no murmur, rub or gallop Abdomen: Soft, non-tender, nondistended, normoactive bowel sounds, no masses, no hepatosplenomegaly Extremities: No clubbing, cyanosis or edema Pulses: 2+ and symmetric all extremities Skin: Skin color, texture, turgor normal, no rashes or lesions Lymph nodes: Cervical, supraclavicular, and axillary nodes normal Neurologic: CNII-XII intact,  Psych: Normal mood, affect, hygiene and grooming  ASSESSMENT/PLAN: Encounter Diagnoses  Name Primary?  . Benign prostatic hyperplasia with urinary hesitancy Yes  . S/P right TKA   . S/P Left TKA   . PACEMAKER-St.Jude   . Long term (current) use of anticoagulants   . Essential hypertension   . Hyperlipidemia LDL goal <70   . Coronary artery disease involving native heart without angina pectoris, unspecified vessel or lesion type   . Atrial fibrillation, unspecified type ([place])   . Arthritis    recommended at least 30 minutes of aerobic activity at least 5 days/week; proper sunscreen use reviewed; healthy diet  recommendations (less than or equal to 2 drinks/day) reviewed; Immunization recommendations discussed.  Recommend that he get the Shingrix.  He is also return here in several months to assess the use of finasteride.  Encouraged him to continue with physical activity especially to strengthen his knees.  He will continue to be followed by cardiology.   Medicare Attestation I have personally reviewed: The patient's medical and social history Their use of alcohol, tobacco or illicit drugs Their current medications and supplements The patient's functional ability including ADLs,fall risks, home safety risks, cognitive, and hearing and visual impairment Diet and physical activities Evidence for depression or mood disorders  The patient's weight, height, and BMI have been recorded in the chart.  I have made referrals, counseling, and provided  education to the patient based on review of the above and I have provided the patient with a written personalized care plan for preventive services.     Jill Alexanders, MD   03/21/2019

## 2019-03-21 NOTE — Patient Instructions (Addendum)
  [name] , Thank you for taking time to come for your Medicare Wellness Visit. I appreciate your ongoing commitment to your health goals. Please review the following plan we discussed and let me know if I can assist you in the future.   These are the goals we discussed: Start taking the finasteride and continue with your physical activities especially strengthening your knees. This is a list of the screening recommended for you and due dates:  Health Maintenance  Topic Date Due  . Flu Shot  04/13/2019  . Tetanus Vaccine  05/17/2027  . Pneumonia vaccines  Completed

## 2019-03-22 LAB — LIPID PANEL
Chol/HDL Ratio: 2.7 ratio (ref 0.0–5.0)
Cholesterol, Total: 96 mg/dL — ABNORMAL LOW (ref 100–199)
HDL: 36 mg/dL — ABNORMAL LOW (ref >39)
LDL Calculated: 49 mg/dL (ref 0–99)
Triglycerides: 54 mg/dL (ref 0–149)
VLDL Cholesterol Cal: 11 mg/dL (ref 5–40)

## 2019-03-26 ENCOUNTER — Telehealth: Payer: Self-pay

## 2019-03-26 NOTE — Telephone Encounter (Signed)
lmom for prescreen  

## 2019-03-27 NOTE — Telephone Encounter (Signed)

## 2019-03-29 ENCOUNTER — Other Ambulatory Visit: Payer: Self-pay

## 2019-03-29 ENCOUNTER — Ambulatory Visit (INDEPENDENT_AMBULATORY_CARE_PROVIDER_SITE_OTHER): Payer: Medicare Other | Admitting: *Deleted

## 2019-03-29 DIAGNOSIS — Z7901 Long term (current) use of anticoagulants: Secondary | ICD-10-CM | POA: Diagnosis not present

## 2019-03-29 LAB — POCT INR: INR: 1.9 — AB (ref 2.0–3.0)

## 2019-04-02 ENCOUNTER — Other Ambulatory Visit: Payer: Self-pay

## 2019-04-02 NOTE — Patient Outreach (Signed)
[facility]) Care Management  04/02/2019  [name]. 07-22-1931 [id number]   Medication Adherence call to Mr. [place] Compliant Voice message left with a call back number. Mr. Lepore is showing past due on Benazepril/Hctz 20/12.5 mg under [place].   [place] Management Direct Dial [phone]  Fax [phone] [email or web address]

## 2019-04-09 DIAGNOSIS — C44329 Squamous cell carcinoma of skin of other parts of face: Secondary | ICD-10-CM | POA: Diagnosis not present

## 2019-04-10 ENCOUNTER — Other Ambulatory Visit: Payer: Self-pay | Admitting: Family Medicine

## 2019-04-18 ENCOUNTER — Ambulatory Visit (INDEPENDENT_AMBULATORY_CARE_PROVIDER_SITE_OTHER): Payer: Medicare Other | Admitting: Family Medicine

## 2019-04-18 ENCOUNTER — Other Ambulatory Visit: Payer: Self-pay

## 2019-04-18 ENCOUNTER — Ambulatory Visit
Admission: RE | Admit: 2019-04-18 | Discharge: 2019-04-18 | Disposition: A | Discharge status: Home / Self Care | Payer: Medicare Other | Source: Ambulatory Visit | Attending: Family Medicine | Admitting: Family Medicine

## 2019-04-18 ENCOUNTER — Encounter: Payer: Self-pay | Admitting: Family Medicine

## 2019-04-18 VITALS — BP 126/84 | HR 66 | Temp 98.2°F | Wt 180.4 lb

## 2019-04-18 DIAGNOSIS — M109 Gout, unspecified: Secondary | ICD-10-CM

## 2019-04-18 DIAGNOSIS — M85872 Other specified disorders of bone density and structure, left ankle and foot: Secondary | ICD-10-CM | POA: Diagnosis not present

## 2019-04-18 MED ORDER — COLCHICINE 0.6 MG PO TABS: 0.6000 mg | ORAL_TABLET | Freq: Two times a day (BID) | ORAL | 0 refills | Status: DC

## 2019-04-18 NOTE — Patient Outreach (Signed)
[facility]) Care Management  04/18/2019  [name]. [birth year] [id number]   Medication Adherence call to [name] Telephone call to Patient regarding Medication Adherence unable to reach patient. [name] is showing past due on Benazepril/Hctz 20/12.5 mg under [place].   [place] Management Direct Dial [phone]  Fax [phone] [email or web address]

## 2019-04-18 NOTE — Progress Notes (Signed)
   Subjective:    Patient ID: [name]., male    DOB: Apr 03, 1931, 85 y.o.   MRN: [id number]  HPI He complains of a 4-day history of left foot especially great toe pain since last Monday.  He does have a previous history of gout several years ago and remembers getting Indocin for it.  He was recently placed on an antibiotic for head procedure and stopped that around the time that the pain started to occur.  He has no other joints involved.  He is presently also on Coumadin.   Review of Systems     Objective:   Physical Exam Alert and in no distress.  Exam of the left foot does show swelling, warmth, erythema and tenderness over the MTP and also slightly swollen over the dorsum of the foot but to a much lesser degree      Assessment & Plan:  Gouty arthritis - Plan: DG Toe Great Left, colchicine 0.6 MG tablet, also recommend Tylenol.  Since he is on Coumadin, I am limited as to what kind of pain medicines I can get.  He will keep in touch with me concerning this.

## 2019-04-26 ENCOUNTER — Ambulatory Visit (INDEPENDENT_AMBULATORY_CARE_PROVIDER_SITE_OTHER): Payer: Medicare Other | Admitting: *Deleted

## 2019-04-26 ENCOUNTER — Other Ambulatory Visit: Payer: Self-pay

## 2019-04-26 DIAGNOSIS — Z7901 Long term (current) use of anticoagulants: Secondary | ICD-10-CM | POA: Diagnosis not present

## 2019-04-26 LAB — POCT INR: INR: 3 (ref 2.0–3.0)

## 2019-04-26 NOTE — Patient Instructions (Signed)
Description   Skip today's dose, then Continue with 1/2 tablet (2.5 mg) daily, repeat INR in 4 weeks.  Call [phone] with any medication changes or concerns.

## 2019-04-29 ENCOUNTER — Telehealth: Payer: Self-pay | Admitting: Family Medicine

## 2019-04-29 NOTE — Telephone Encounter (Signed)
I called him and told him to cut the dosing in half for another week.

## 2019-04-29 NOTE — Telephone Encounter (Signed)
Pt called and said he thinks the medication that he is taking for gout is causing him diarrhea. Pt can be reached at [phone]

## 2019-05-05 ENCOUNTER — Inpatient Hospital Stay (HOSPITAL_COMMUNITY)
Admission: EM | Admit: 2019-05-05 | Discharge: 2019-05-08 | DRG: 394 | Disposition: A | Discharge status: Home / Self Care | Payer: Medicare Other | Attending: Student | Admitting: Student

## 2019-05-05 ENCOUNTER — Other Ambulatory Visit: Payer: Self-pay

## 2019-05-05 ENCOUNTER — Encounter (HOSPITAL_COMMUNITY): Payer: Self-pay | Admitting: Emergency Medicine

## 2019-05-05 DIAGNOSIS — Z8249 Family history of ischemic heart disease and other diseases of the circulatory system: Secondary | ICD-10-CM | POA: Diagnosis not present

## 2019-05-05 DIAGNOSIS — Z950 Presence of cardiac pacemaker: Secondary | ICD-10-CM

## 2019-05-05 DIAGNOSIS — E785 Hyperlipidemia, unspecified: Secondary | ICD-10-CM | POA: Diagnosis not present

## 2019-05-05 DIAGNOSIS — I959 Hypotension, unspecified: Secondary | ICD-10-CM | POA: Diagnosis not present

## 2019-05-05 DIAGNOSIS — M109 Gout, unspecified: Secondary | ICD-10-CM | POA: Diagnosis not present

## 2019-05-05 DIAGNOSIS — I4891 Unspecified atrial fibrillation: Secondary | ICD-10-CM | POA: Diagnosis not present

## 2019-05-05 DIAGNOSIS — R791 Abnormal coagulation profile: Secondary | ICD-10-CM | POA: Diagnosis present

## 2019-05-05 DIAGNOSIS — N179 Acute kidney failure, unspecified: Secondary | ICD-10-CM | POA: Diagnosis not present

## 2019-05-05 DIAGNOSIS — T504X5A Adverse effect of drugs affecting uric acid metabolism, initial encounter: Secondary | ICD-10-CM | POA: Diagnosis not present

## 2019-05-05 DIAGNOSIS — K521 Toxic gastroenteritis and colitis: Secondary | ICD-10-CM | POA: Diagnosis not present

## 2019-05-05 DIAGNOSIS — Z03818 Encounter for observation for suspected exposure to other biological agents ruled out: Secondary | ICD-10-CM | POA: Diagnosis not present

## 2019-05-05 DIAGNOSIS — Z87891 Personal history of nicotine dependence: Secondary | ICD-10-CM

## 2019-05-05 DIAGNOSIS — Z20828 Contact with and (suspected) exposure to other viral communicable diseases: Secondary | ICD-10-CM | POA: Diagnosis present

## 2019-05-05 DIAGNOSIS — Z841 Family history of disorders of kidney and ureter: Secondary | ICD-10-CM

## 2019-05-05 DIAGNOSIS — Z823 Family history of stroke: Secondary | ICD-10-CM | POA: Diagnosis not present

## 2019-05-05 DIAGNOSIS — N183 Chronic kidney disease, stage 3 unspecified: Secondary | ICD-10-CM | POA: Diagnosis present

## 2019-05-05 DIAGNOSIS — E876 Hypokalemia: Secondary | ICD-10-CM | POA: Diagnosis not present

## 2019-05-05 DIAGNOSIS — E860 Dehydration: Secondary | ICD-10-CM | POA: Diagnosis not present

## 2019-05-05 DIAGNOSIS — D631 Anemia in chronic kidney disease: Secondary | ICD-10-CM | POA: Diagnosis present

## 2019-05-05 DIAGNOSIS — Z7901 Long term (current) use of anticoagulants: Secondary | ICD-10-CM

## 2019-05-05 DIAGNOSIS — I2510 Atherosclerotic heart disease of native coronary artery without angina pectoris: Secondary | ICD-10-CM | POA: Diagnosis not present

## 2019-05-05 DIAGNOSIS — R197 Diarrhea, unspecified: Secondary | ICD-10-CM | POA: Diagnosis not present

## 2019-05-05 DIAGNOSIS — I4821 Permanent atrial fibrillation: Secondary | ICD-10-CM | POA: Diagnosis not present

## 2019-05-05 DIAGNOSIS — I10 Essential (primary) hypertension: Secondary | ICD-10-CM | POA: Diagnosis present

## 2019-05-05 DIAGNOSIS — N1831 Chronic kidney disease, stage 3a: Secondary | ICD-10-CM | POA: Diagnosis present

## 2019-05-05 DIAGNOSIS — K219 Gastro-esophageal reflux disease without esophagitis: Secondary | ICD-10-CM | POA: Diagnosis present

## 2019-05-05 DIAGNOSIS — Z79899 Other long term (current) drug therapy: Secondary | ICD-10-CM

## 2019-05-05 DIAGNOSIS — I129 Hypertensive chronic kidney disease with stage 1 through stage 4 chronic kidney disease, or unspecified chronic kidney disease: Secondary | ICD-10-CM | POA: Diagnosis present

## 2019-05-05 DIAGNOSIS — Z885 Allergy status to narcotic agent status: Secondary | ICD-10-CM

## 2019-05-05 DIAGNOSIS — N400 Enlarged prostate without lower urinary tract symptoms: Secondary | ICD-10-CM | POA: Diagnosis present

## 2019-05-05 DIAGNOSIS — I480 Paroxysmal atrial fibrillation: Secondary | ICD-10-CM | POA: Diagnosis not present

## 2019-05-05 DIAGNOSIS — H409 Unspecified glaucoma: Secondary | ICD-10-CM | POA: Diagnosis present

## 2019-05-05 DIAGNOSIS — D509 Iron deficiency anemia, unspecified: Secondary | ICD-10-CM | POA: Diagnosis not present

## 2019-05-05 LAB — COMPREHENSIVE METABOLIC PANEL
ALT: 11 U/L (ref 0–44)
AST: 19 U/L (ref 15–41)
Albumin: 3.2 g/dL — ABNORMAL LOW (ref 3.5–5.0)
Alkaline Phosphatase: 61 U/L (ref 38–126)
Anion gap: 12 (ref 5–15)
BUN: 31 mg/dL — ABNORMAL HIGH (ref 8–23)
CO2: 21 mmol/L — ABNORMAL LOW (ref 22–32)
Calcium: 9 mg/dL (ref 8.9–10.3)
Chloride: 105 mmol/L (ref 98–111)
Creatinine, Ser: 2.59 mg/dL — ABNORMAL HIGH (ref 0.61–1.24)
GFR calc Af Amer: 25 mL/min — ABNORMAL LOW (ref >60)
GFR calc non Af Amer: 21 mL/min — ABNORMAL LOW (ref >60)
Glucose, Bld: 114 mg/dL — ABNORMAL HIGH (ref 70–99)
Potassium: 3.7 mmol/L (ref 3.5–5.1)
Sodium: 138 mmol/L (ref 135–145)
Total Bilirubin: 1.3 mg/dL — ABNORMAL HIGH (ref 0.3–1.2)
Total Protein: 5.9 g/dL — ABNORMAL LOW (ref 6.5–8.1)

## 2019-05-05 LAB — CBC WITH DIFFERENTIAL/PLATELET
Abs Immature Granulocytes: 0.06 10*3/uL (ref 0.00–0.07)
Basophils Absolute: 0.1 10*3/uL (ref 0.0–0.1)
Basophils Relative: 0 %
Eosinophils Absolute: 0 10*3/uL (ref 0.0–0.5)
Eosinophils Relative: 0 %
HCT: 38.4 % — ABNORMAL LOW (ref 39.0–52.0)
Hemoglobin: 12.4 g/dL — ABNORMAL LOW (ref 13.0–17.0)
Immature Granulocytes: 1 %
Lymphocytes Relative: 5 %
Lymphs Abs: 0.6 10*3/uL — ABNORMAL LOW (ref 0.7–4.0)
MCH: 30.2 pg (ref 26.0–34.0)
MCHC: 32.3 g/dL (ref 30.0–36.0)
MCV: 93.4 fL (ref 80.0–100.0)
Monocytes Absolute: 1.5 10*3/uL — ABNORMAL HIGH (ref 0.1–1.0)
Monocytes Relative: 12 %
Neutro Abs: 10.8 10*3/uL — ABNORMAL HIGH (ref 1.7–7.7)
Neutrophils Relative %: 82 %
Platelets: 151 10*3/uL (ref 150–400)
RBC: 4.11 MIL/uL — ABNORMAL LOW (ref 4.22–5.81)
RDW: 14.3 % (ref 11.5–15.5)
WBC: 13.1 10*3/uL — ABNORMAL HIGH (ref 4.0–10.5)
nRBC: 0 % (ref 0.0–0.2)

## 2019-05-05 NOTE — ED Triage Notes (Addendum)
Patient reports diarrhea this week , denies fever , no abdominal pain , pt. stated diarrhea started after taking prescription Colchicine for his gout . Hypotensive at triage .

## 2019-05-06 ENCOUNTER — Inpatient Hospital Stay (HOSPITAL_COMMUNITY): Payer: Medicare Other

## 2019-05-06 DIAGNOSIS — Z87891 Personal history of nicotine dependence: Secondary | ICD-10-CM | POA: Diagnosis not present

## 2019-05-06 DIAGNOSIS — M109 Gout, unspecified: Secondary | ICD-10-CM | POA: Diagnosis present

## 2019-05-06 DIAGNOSIS — I959 Hypotension, unspecified: Secondary | ICD-10-CM | POA: Diagnosis present

## 2019-05-06 DIAGNOSIS — D631 Anemia in chronic kidney disease: Secondary | ICD-10-CM | POA: Diagnosis present

## 2019-05-06 DIAGNOSIS — K521 Toxic gastroenteritis and colitis: Secondary | ICD-10-CM | POA: Diagnosis present

## 2019-05-06 DIAGNOSIS — N179 Acute kidney failure, unspecified: Secondary | ICD-10-CM | POA: Diagnosis present

## 2019-05-06 DIAGNOSIS — I2510 Atherosclerotic heart disease of native coronary artery without angina pectoris: Secondary | ICD-10-CM

## 2019-05-06 DIAGNOSIS — Z8249 Family history of ischemic heart disease and other diseases of the circulatory system: Secondary | ICD-10-CM | POA: Diagnosis not present

## 2019-05-06 DIAGNOSIS — H409 Unspecified glaucoma: Secondary | ICD-10-CM | POA: Diagnosis present

## 2019-05-06 DIAGNOSIS — T504X5A Adverse effect of drugs affecting uric acid metabolism, initial encounter: Secondary | ICD-10-CM | POA: Diagnosis present

## 2019-05-06 DIAGNOSIS — E860 Dehydration: Secondary | ICD-10-CM | POA: Diagnosis present

## 2019-05-06 DIAGNOSIS — I4821 Permanent atrial fibrillation: Secondary | ICD-10-CM | POA: Diagnosis not present

## 2019-05-06 DIAGNOSIS — E785 Hyperlipidemia, unspecified: Secondary | ICD-10-CM | POA: Diagnosis present

## 2019-05-06 DIAGNOSIS — Z20828 Contact with and (suspected) exposure to other viral communicable diseases: Secondary | ICD-10-CM | POA: Diagnosis present

## 2019-05-06 DIAGNOSIS — R791 Abnormal coagulation profile: Secondary | ICD-10-CM | POA: Diagnosis present

## 2019-05-06 DIAGNOSIS — Z950 Presence of cardiac pacemaker: Secondary | ICD-10-CM | POA: Diagnosis not present

## 2019-05-06 DIAGNOSIS — I4891 Unspecified atrial fibrillation: Secondary | ICD-10-CM | POA: Diagnosis not present

## 2019-05-06 DIAGNOSIS — N183 Chronic kidney disease, stage 3 (moderate): Secondary | ICD-10-CM | POA: Diagnosis not present

## 2019-05-06 DIAGNOSIS — E876 Hypokalemia: Secondary | ICD-10-CM | POA: Diagnosis not present

## 2019-05-06 DIAGNOSIS — I129 Hypertensive chronic kidney disease with stage 1 through stage 4 chronic kidney disease, or unspecified chronic kidney disease: Secondary | ICD-10-CM | POA: Diagnosis present

## 2019-05-06 DIAGNOSIS — R197 Diarrhea, unspecified: Secondary | ICD-10-CM | POA: Diagnosis not present

## 2019-05-06 DIAGNOSIS — I480 Paroxysmal atrial fibrillation: Secondary | ICD-10-CM | POA: Diagnosis present

## 2019-05-06 DIAGNOSIS — N400 Enlarged prostate without lower urinary tract symptoms: Secondary | ICD-10-CM | POA: Diagnosis present

## 2019-05-06 DIAGNOSIS — Z841 Family history of disorders of kidney and ureter: Secondary | ICD-10-CM | POA: Diagnosis not present

## 2019-05-06 DIAGNOSIS — D509 Iron deficiency anemia, unspecified: Secondary | ICD-10-CM | POA: Diagnosis present

## 2019-05-06 DIAGNOSIS — Z823 Family history of stroke: Secondary | ICD-10-CM | POA: Diagnosis not present

## 2019-05-06 LAB — COMPREHENSIVE METABOLIC PANEL
ALT: 6 U/L (ref 0–44)
AST: 22 U/L (ref 15–41)
Albumin: 2.8 g/dL — ABNORMAL LOW (ref 3.5–5.0)
Alkaline Phosphatase: 51 U/L (ref 38–126)
Anion gap: 13 (ref 5–15)
BUN: 33 mg/dL — ABNORMAL HIGH (ref 8–23)
CO2: 21 mmol/L — ABNORMAL LOW (ref 22–32)
Calcium: 8.2 mg/dL — ABNORMAL LOW (ref 8.9–10.3)
Chloride: 108 mmol/L (ref 98–111)
Creatinine, Ser: 2.6 mg/dL — ABNORMAL HIGH (ref 0.61–1.24)
GFR calc Af Amer: 25 mL/min — ABNORMAL LOW (ref >60)
GFR calc non Af Amer: 21 mL/min — ABNORMAL LOW (ref >60)
Glucose, Bld: 98 mg/dL (ref 70–99)
Potassium: 4 mmol/L (ref 3.5–5.1)
Sodium: 142 mmol/L (ref 135–145)
Total Bilirubin: 1.5 mg/dL — ABNORMAL HIGH (ref 0.3–1.2)
Total Protein: 4.9 g/dL — ABNORMAL LOW (ref 6.5–8.1)

## 2019-05-06 LAB — LACTIC ACID, PLASMA: Lactic Acid, Venous: 1.8 mmol/L (ref 0.5–1.9)

## 2019-05-06 LAB — CBC
HCT: 35.6 % — ABNORMAL LOW (ref 39.0–52.0)
Hemoglobin: 11.2 g/dL — ABNORMAL LOW (ref 13.0–17.0)
MCH: 30.3 pg (ref 26.0–34.0)
MCHC: 31.5 g/dL (ref 30.0–36.0)
MCV: 96.2 fL (ref 80.0–100.0)
Platelets: 149 10*3/uL — ABNORMAL LOW (ref 150–400)
RBC: 3.7 MIL/uL — ABNORMAL LOW (ref 4.22–5.81)
RDW: 14.5 % (ref 11.5–15.5)
WBC: 11.4 10*3/uL — ABNORMAL HIGH (ref 4.0–10.5)
nRBC: 0 % (ref 0.0–0.2)

## 2019-05-06 LAB — PROTIME-INR
INR: 4.3 (ref 0.8–1.2)
Prothrombin Time: 40.3 seconds — ABNORMAL HIGH (ref 11.4–15.2)

## 2019-05-06 LAB — SARS CORONAVIRUS 2 (TAT 6-24 HRS): SARS Coronavirus 2: NEGATIVE

## 2019-05-06 MED ORDER — WARFARIN - PHARMACIST DOSING INPATIENT: Freq: Every day | Status: DC

## 2019-05-06 MED ORDER — SODIUM CHLORIDE 0.9 % IV BOLUS (SEPSIS)
1000.0000 mL | Freq: Once | INTRAVENOUS | Status: AC
  Administered 2019-05-06: 1000 mL via INTRAVENOUS

## 2019-05-06 MED ORDER — POTASSIUM CHLORIDE CRYS ER 10 MEQ PO TBCR
10.0000 meq | EXTENDED_RELEASE_TABLET | Freq: Every day | ORAL | Status: DC
  Administered 2019-05-06: 10 meq via ORAL
  Filled 2019-05-06: qty 1

## 2019-05-06 MED ORDER — LATANOPROST 0.005 % OP SOLN
1.0000 [drp] | Freq: Every day | OPHTHALMIC | Status: DC
  Administered 2019-05-06 – 2019-05-07 (×2): 1 [drp] via OPHTHALMIC
  Filled 2019-05-06: qty 2.5

## 2019-05-06 MED ORDER — CARVEDILOL 3.125 MG PO TABS
3.1250 mg | ORAL_TABLET | Freq: Two times a day (BID) | ORAL | Status: DC
  Administered 2019-05-06 – 2019-05-08 (×5): 3.125 mg via ORAL
  Filled 2019-05-06 (×5): qty 1

## 2019-05-06 MED ORDER — ENOXAPARIN SODIUM 30 MG/0.3ML ~~LOC~~ SOLN: 30.0000 mg | SUBCUTANEOUS | Status: DC

## 2019-05-06 MED ORDER — SIMVASTATIN 20 MG PO TABS
20.0000 mg | ORAL_TABLET | Freq: Every day | ORAL | Status: DC
  Administered 2019-05-06 – 2019-05-07 (×2): 20 mg via ORAL
  Filled 2019-05-06 (×2): qty 1

## 2019-05-06 MED ORDER — ACETAMINOPHEN 325 MG PO TABS: 650.0000 mg | ORAL_TABLET | Freq: Four times a day (QID) | ORAL | Status: DC | PRN

## 2019-05-06 MED ORDER — FINASTERIDE 5 MG PO TABS
5.0000 mg | ORAL_TABLET | Freq: Every day | ORAL | Status: DC
  Administered 2019-05-06 – 2019-05-08 (×3): 5 mg via ORAL
  Filled 2019-05-06 (×3): qty 1

## 2019-05-06 MED ORDER — SODIUM BICARBONATE 8.4 % IV SOLN
50.0000 meq | Freq: Once | INTRAVENOUS | Status: AC
  Administered 2019-05-06: 50 meq via INTRAVENOUS
  Filled 2019-05-06: qty 50

## 2019-05-06 MED ORDER — ACETAMINOPHEN 650 MG RE SUPP: 650.0000 mg | Freq: Four times a day (QID) | RECTAL | Status: DC | PRN

## 2019-05-06 MED ORDER — PROSIGHT PO TABS
1.0000 | ORAL_TABLET | Freq: Two times a day (BID) | ORAL | Status: DC
  Administered 2019-05-06 – 2019-05-08 (×5): 1 via ORAL
  Filled 2019-05-06 (×5): qty 1

## 2019-05-06 MED ORDER — VITAMIN D 25 MCG (1000 UNIT) PO TABS
1000.0000 [IU] | ORAL_TABLET | Freq: Every day | ORAL | Status: DC
  Administered 2019-05-06 – 2019-05-08 (×3): 1000 [IU] via ORAL
  Filled 2019-05-06 (×3): qty 1

## 2019-05-06 MED ORDER — DOXAZOSIN MESYLATE 8 MG PO TABS
4.0000 mg | ORAL_TABLET | Freq: Every day | ORAL | Status: DC
  Administered 2019-05-06 – 2019-05-07 (×2): 4 mg via ORAL
  Filled 2019-05-06 (×2): qty 1

## 2019-05-06 MED ORDER — SODIUM CHLORIDE 0.9 % IV SOLN
INTRAVENOUS | Status: AC
  Administered 2019-05-06 (×2): via INTRAVENOUS

## 2019-05-06 NOTE — ED Provider Notes (Signed)
[facility] EMERGENCY DEPARTMENT Provider Note   CSN: [id number] Arrival date & time: 05/05/19  2106     History   Chief Complaint Chief Complaint  Patient presents with  . Diarrhea    Hypotensive    HPI [name]. is a 85 y.o. male.     The history is provided by the patient.  Diarrhea Quality:  Watery Severity:  Moderate Onset quality:  Gradual Duration:  10 days Timing:  Intermittent Progression:  Worsening Relieved by:  Nothing Exacerbated by: Colchicine. Associated symptoms: no abdominal pain, no fever and no vomiting   Risk factors: no recent antibiotic use   Patient presents with multiple episodes of diarrhea for the past 10 days.  No fevers or vomiting.  No abdominal pain No bloody stools He reports he was placed on colchicine for gout, and the diarrhea started after that.  He has scaled back to colchicine use, last dose was over 2 days ago  he is feeling weak from the diarrhea  Past Medical History:  Diagnosis Date  . A-fib ([place])   . Arthritis   . CAD (coronary artery disease)    GXT, neg bruce protocol GXT  . Chronic kidney disease    RENAL INSUFFICIENCY  . Diverticulosis   . ED (erectile dysfunction)   . GERD (gastroesophageal reflux disease)   . Glaucoma   . Gout    no flare in years   . Heart disorder   . Hypertension   . Obesity     Patient Active Problem List   Diagnosis Date Noted  . Benign prostatic hyperplasia with urinary hesitancy 03/21/2019  . Iron deficiency anemia 09/18/2018  . S/P Left TKA 09/06/2018  . S/P right TKA 02/27/2018  . Obesity   . GERD (gastroesophageal reflux disease)   . ED (erectile dysfunction)   . Diverticulosis   . CAD (coronary artery disease)   . A-fib ([place])   . CKD (chronic kidney disease) stage 3, GFR 30-59 ml/min ([facility]) 01/13/2017  . Atherosclerosis 12/12/2016  . Arthritis 12/01/2014  . Abnormality of gait 02/05/2013  . Long term (current) use of anticoagulants 12/29/2012   . Glaucoma 07/26/2011  . Hyperlipidemia LDL goal <70 07/26/2011  . Hypertension 07/26/2011  . BRADYCARDIA 12/26/2008  . PACEMAKER-St.Jude 12/26/2008    Past Surgical History:  Procedure Laterality Date  . CATARACT EXTRACTION, BILATERAL  2018   with lens placement   . PACEMAKER INSERTION  01/09/03  . TONSILLECTOMY    . TOTAL KNEE ARTHROPLASTY Right 02/27/2018   Procedure: RIGHT TOTAL KNEE ARTHROPLASTY;  Surgeon: Paralee Cancel, MD;  Location: [facility] ORS;  Service: Orthopedics;  Laterality: Right;  70 mins  . TOTAL KNEE ARTHROPLASTY Left 09/06/2018   Procedure: LEFT TOTAL KNEE ARTHROPLASTY;  Surgeon: Paralee Cancel, MD;  Location: [facility] ORS;  Service: Orthopedics;  Laterality: Left;  70 mins        Home Medications    Prior to Admission medications   Medication Sig Start Date End Date Taking? Authorizing Provider  acetaminophen (TYLENOL) 500 MG tablet Take 500 mg by mouth every 6 (six) hours as needed (for pain).     [provider]  albuterol (PROVENTIL HFA;VENTOLIN HFA) 108 (90 Base) MCG/ACT inhaler Inhale 2 puffs into the lungs every 6 (six) hours as needed for wheezing or shortness of breath. Patient not taking: Reported on 03/21/2019 09/20/18   Norval Morton, MD  amoxicillin-clavulanate (AUGMENTIN) 875-125 MG tablet Take 1 tablet by mouth every 12 (twelve) hours.  Patient not taking: Reported on 03/21/2019 09/20/18   Norval Morton, MD  benazepril-hydrochlorthiazide (LOTENSIN HCT) 20-12.5 MG tablet TAKE 1 TABLET BY MOUTH  DAILY 01/28/19   [name], MD  carvedilol (COREG) 3.125 MG tablet TAKE 1 TABLET BY MOUTH TWO  TIMES DAILY 10/17/18   Evans Lance, MD  Cholecalciferol (VITAMIN D3) 1000 units CAPS Take 1,000 Units by mouth daily.    [provider]  colchicine 0.6 MG tablet Take 1 tablet (0.6 mg total) by mouth 2 (two) times daily. 04/18/19   [name], MD  doxazosin (CARDURA) 4 MG tablet TAKE 1 TABLET BY MOUTH AT  BEDTIME 10/17/18   Evans Lance, MD  ferrous  sulfate (FERROUSUL) 325 (65 FE) MG tablet Take 1 tablet (325 mg total) by mouth 3 (three) times daily with meals. Patient not taking: Reported on 09/17/2018 09/06/18   Danae Orleans, PA-C  finasteride (PROSCAR) 5 MG tablet Take 1 tablet (5 mg total) by mouth daily. 03/21/19   [name], MD  latanoprost (XALATAN) 0.005 % ophthalmic solution Place 1 drop into both eyes at bedtime.  06/12/17   [provider]  methocarbamol (ROBAXIN) 500 MG tablet Take 1 tablet (500 mg total) by mouth every 6 (six) hours as needed for muscle spasms. Patient not taking: Reported on 09/17/2018 09/06/18   Danae Orleans, PA-C  Multiple Vitamins-Minerals (PRESERVISION AREDS 2 PO) Take 2 capsules by mouth daily.     [provider]  potassium chloride SA (K-DUR) 20 MEQ tablet TAKE ONE-HALF TABLET BY  MOUTH DAILY 04/10/19   [name], MD  predniSONE (DELTASONE) 10 MG tablet Taper: Take prednisone 30 mg  p.o. x2 days, then take 20 mg p.o. x2 days, then take 10 mg x 2 days then discontinue Patient not taking: Reported on 03/21/2019 09/20/18   Norval Morton, MD  simvastatin (ZOCOR) 20 MG tablet TAKE 1 TABLET BY MOUTH AT  BEDTIME 03/11/19   [name], MD  warfarin (COUMADIN) 5 MG tablet TAKE 1/2 TABLET BY MOUTH  DAILY OR AS DIRECTED BY  COUMADIN CLINIC 03/11/19   Evans Lance, MD    Family History Family History  Problem Relation Age of Onset  . Heart disease Mother   . Hypertension Mother   . Kidney disease Mother   . Stroke Mother   . Heart disease Father     Social History Social History   Tobacco Use  . Smoking status: Former Smoker    Packs/day: 1.00    Years: 10.00    Pack years: 10.00    Quit date: 09/13/1979    Years since quitting: 39.6  . Smokeless tobacco: Never Used  Substance Use Topics  . Alcohol use: Yes    Comment: Consumes 2-3 glasses of wine per week; now only socially   . Drug use: No     Allergies   Tramadol   Review of Systems Review of Systems   Constitutional: Negative for fever.  Respiratory: Negative for cough.   Gastrointestinal: Positive for diarrhea. Negative for abdominal pain, blood in stool and vomiting.  All other systems reviewed and are negative.    Physical Exam Updated Vital Signs BP (!) 91/51   Pulse 82   Temp 98.5 F (36.9 C) (Oral)   Resp 18   SpO2 97%   Physical Exam CONSTITUTIONAL: Elderly, no acute distress HEAD: Normocephalic/atraumatic EYES: EOMI/PERRL ENMT: Mucous membranes moist NECK: supple no meningeal signs SPINE/BACK:entire spine nontender CV: S1/S2 noted,  no loud murmurs LUNGS: Lungs are clear to auscultation bilaterally, no apparent distress ABDOMEN: soft, nontender, no rebound or guarding, bowel sounds noted throughout abdomen GU:no cva tenderness NEURO: Pt is awake/alert/appropriate, moves all extremitiesx4.  No facial droop.   EXTREMITIES: pulses normal/equal, full ROM SKIN: warm, color normal PSYCH: no abnormalities of mood noted, alert and oriented to situation   ED Treatments / Results  Labs (all labs ordered are listed, but only abnormal results are displayed) Labs Reviewed  CBC WITH DIFFERENTIAL/PLATELET - Abnormal; Notable for the following components:      Result Value   WBC 13.1 (*)    RBC 4.11 (*)    Hemoglobin 12.4 (*)    HCT 38.4 (*)    Neutro Abs 10.8 (*)    Lymphs Abs 0.6 (*)    Monocytes Absolute 1.5 (*)    All other components within normal limits  COMPREHENSIVE METABOLIC PANEL - Abnormal; Notable for the following components:   CO2 21 (*)    Glucose, Bld 114 (*)    BUN 31 (*)    Creatinine, Ser 2.59 (*)    Total Protein 5.9 (*)    Albumin 3.2 (*)    Total Bilirubin 1.3 (*)    GFR calc non Af Amer 21 (*)    GFR calc Af Amer 25 (*)    All other components within normal limits  SARS CORONAVIRUS 2  LACTIC ACID, PLASMA  PROTIME-INR    EKG None  Radiology No results found.  Procedures .Critical Care Performed by: Ripley Fraise, MD  Authorized by: Ripley Fraise, MD   Critical care provider statement:    Critical care time (minutes):  35   Critical care start time:  05/06/2019 2:25 AM   Critical care end time:  05/06/2019 2:50 AM   Critical care was necessary to treat or prevent imminent or life-threatening deterioration of the following conditions:  Sepsis and circulatory failure   Critical care was time spent personally by me on the following activities:  Evaluation of patient's response to treatment, re-evaluation of patient's condition, pulse oximetry, ordering and review of laboratory studies, examination of patient and review of old charts   I assumed direction of critical care for this patient from another provider in my specialty: no      Medications Ordered in ED Medications  sodium chloride 0.9 % bolus 1,000 mL (has no administration in time range)  sodium chloride 0.9 % bolus 1,000 mL (1,000 mLs Intravenous New Bag/Given 05/06/19 0148)     Initial Impression / Assessment and Plan / ED Course  I have reviewed the triage vital signs and the nursing notes.  Pertinent labs   results that were available during my care of the patient were reviewed by me and considered in my medical decision making (see chart for details).        2:24 AM Patient reports multiple episodes of diarrhea over the past 10 days.  This is likely induced by colchicine use for gout.  He has acute renal failure and hypotension.  Patient will require admission 2:52 AM Blood pressure is starting to improve.  Systolic pressure is now above 100.  Patient denies any new complaints.  He will receive a second liter of IV fluids. Patient will require admission for further rehydration and monitoring. 3:00 AM D/w dr Maudie Mercury for admission Pt is improving with IV fluids  Final Clinical Impressions(s) / ED Diagnoses   Final diagnoses:  Diarrhea, unspecified type  Dehydration  AKI (acute kidney  injury) [facility])    ED Discharge Orders    None        Ripley Fraise, MD 05/06/19 [id number]

## 2019-05-06 NOTE — Progress Notes (Signed)
ANTICOAGULATION CONSULT NOTE - Initial Consult  Pharmacy Consult for warfarin Indication: atrial fibrillation  Allergies  Allergen Reactions  . Tramadol Itching    Patient Measurements:    Vital Signs: Temp: 97.8 F (36.6 C) (08/24 1255) Temp Source: Oral (08/24 1255) BP: 98/62 (08/24 1255) Pulse Rate: 79 (08/24 1255)  Labs: Recent Labs    05/05/19 2145 05/06/19 0150 05/06/19 0520  HGB 12.4*  --  11.2*  HCT 38.4*  --  35.6*  PLT 151  --  149*  LABPROT  --  40.3*  --   INR  --  4.3*  --   CREATININE 2.59*  --  2.60*    CrCl cannot be calculated (Unknown ideal weight.).   Medical History: Past Medical History:  Diagnosis Date  . A-fib ([place])   . Arthritis   . CAD (coronary artery disease)    GXT, neg bruce protocol GXT  . Chronic kidney disease    RENAL INSUFFICIENCY  . Diverticulosis   . ED (erectile dysfunction)   . GERD (gastroesophageal reflux disease)   . Glaucoma   . Gout    no flare in years   . Heart disorder   . Hypertension   . Obesity     Medications:  Medications Prior to Admission  Medication Sig Dispense Refill Last Dose  . acetaminophen (TYLENOL) 500 MG tablet Take 500 mg by mouth every 6 (six) hours as needed (for pain).    unk  . benazepril-hydrochlorthiazide (LOTENSIN HCT) 20-12.5 MG tablet TAKE 1 TABLET BY MOUTH  DAILY (Patient taking differently: Take 1 tablet by mouth daily. ) 90 tablet 0 05/05/2019 at Unknown time  . carvedilol (COREG) 3.125 MG tablet TAKE 1 TABLET BY MOUTH TWO  TIMES DAILY (Patient taking differently: Take 3.125 mg by mouth 2 (two) times daily with a meal. ) 180 tablet 3 05/05/2019 at 0900  . Cholecalciferol (VITAMIN D3) 1000 units CAPS Take 1,000 Units by mouth daily.   05/05/2019 at Unknown time  . colchicine 0.6 MG tablet Take 1 tablet (0.6 mg total) by mouth 2 (two) times daily. 30 tablet 0 05/02/2019  . doxazosin (CARDURA) 4 MG tablet TAKE 1 TABLET BY MOUTH AT  BEDTIME (Patient taking differently: Take 4 mg by  mouth at bedtime. ) 90 tablet 3 05/04/2019  . finasteride (PROSCAR) 5 MG tablet Take 1 tablet (5 mg total) by mouth daily. 90 tablet 3 05/05/2019 at Unknown time  . latanoprost (XALATAN) 0.005 % ophthalmic solution Place 1 drop into both eyes at bedtime.   3 05/04/2019  . Multiple Vitamins-Minerals (PRESERVISION AREDS 2 PO) Take 1 capsule by mouth 2 (two) times daily.    05/05/2019 at Unknown time  . potassium chloride SA (K-DUR) 20 MEQ tablet TAKE ONE-HALF TABLET BY  MOUTH DAILY (Patient taking differently: Take 10 mEq by mouth daily. ) 45 tablet 3 05/05/2019 at Unknown time  . simvastatin (ZOCOR) 20 MG tablet TAKE 1 TABLET BY MOUTH AT  BEDTIME (Patient taking differently: Take 20 mg by mouth at bedtime. ) 90 tablet 0 05/04/2019  . warfarin (COUMADIN) 5 MG tablet TAKE 1/2 TABLET BY MOUTH  DAILY OR AS DIRECTED BY  COUMADIN CLINIC (Patient taking differently: Take 2.5 mg by mouth every evening. TAKE 1/2 TABLET BY MOUTH  DAILY OR AS DIRECTED BY  COUMADIN CLINIC) 45 tablet 1 05/04/2019 at 2200  . albuterol (PROVENTIL HFA;VENTOLIN HFA) 108 (90 Base) MCG/ACT inhaler Inhale 2 puffs into the lungs every 6 (six) hours as needed for wheezing  or shortness of breath. (Patient not taking: Reported on 03/21/2019) 1 Inhaler 0 Not Taking at Unknown time  . ferrous sulfate (FERROUSUL) 325 (65 FE) MG tablet Take 1 tablet (325 mg total) by mouth 3 (three) times daily with meals. (Patient not taking: Reported on 09/17/2018)  3 Not Taking at Unknown time  . methocarbamol (ROBAXIN) 500 MG tablet Take 1 tablet (500 mg total) by mouth every 6 (six) hours as needed for muscle spasms. (Patient not taking: Reported on 09/17/2018) 40 tablet 0 Not Taking at Unknown time    Assessment: 85 y/o male on warfarin PTA for Afib admitted with AKI in setting of hypotension and diarrhea from colchicine use for gout. INR on admit is supratherapeutic at 4.3. No bleeding noted, Hgb stable in 11-12s, platelets are mildly low.  PTA regimen: 2.5 mg daily,  last 8/22  Goal of Therapy:  INR 2-3 Monitor platelets by anticoagulation protocol: Yes   Plan:  No warfarin tonight Daily INR Monitor for s/sx of bleeding   Thank you for involving pharmacy in this patient's care.  Renold Genta, PharmD, BCPS Clinical Pharmacist Clinical phone for 05/06/2019 until 3p is [phone] 05/06/2019 1:54 PM  **Pharmacist phone directory can be found on [email or web address] listed under Center**

## 2019-05-06 NOTE — Progress Notes (Signed)
Admission from earlier this morning.  Briefly, 85 y.o. male with history of HTN, CKD, iron deficiency anemia, CAD, paroxysmal A. fib on Coumadin and gout presenting with diarrhea and hypotension after he was started on colchicine about 1.5 ago for his gout.  He was admitted for AKI on CKD-3 in the setting of diarrhea as above and hypotension.  Started on IV fluid.  This morning, patient continues to have watery bowel movement, about 4 since midnight.  Hematochezia or melena.  Feels somewhat weak but no chest pain or dyspnea.  Reports some abdominal discomfort but not.  Denies urinary symptoms.  On exam, no acute distress.  Irregular heart rhythm.  Normal rate.  Some discomfort with abdominal palpation but no significant pain.  Lung exam within normal range.  Assessment and plan Diarrhea likely due to colchicine: Abdominal exam is benign.  Had mild leukocytosis but no fever.  Doubt infectious process. -Continue IV fluid hydration. -Colchicine discontinued.  Hypotension: Normotensive this morning. -IV hydration as above -Follow echocardiogram.  AKI on CKD-3: Creatinine seems to have plateaued at 2.6 (baseline about 1.5).  Renal US consistent with CKD without acute finding. -IV fluid hydration -Recheck renal function in the morning. -Avoid nephrotoxic meds.  Gout flare: seems to have resolved.  He is not in pain now. -Colchicine discontinued due to diarrhea. -We will continue monitoring  Paroxysmal A. Fib: Rate controlled on Coreg.  On warfarin for anticoagulation.  INR supratherapeutic at 4.3. -Continue warfarin per pharmacy  Hypertension: Hypotensive on admission.  Now normotensive. -Continue home Coreg and doxazosin -Hold home benazepril/HCTZ in the setting of AKI and hypotension.  Normocytic anemia: Likely combination of anemia of chronic disease and iron deficiency.  Denies bleeding. -Check anemia panel  See H&P from this morning for more.

## 2019-05-06 NOTE — Progress Notes (Signed)
PHARMACIST - PHYSICIAN COMMUNICATION DR:  Cyndia Skeeters CONCERNING: Pharmacy Care Issues Regarding Warfarin Labs  RECOMMENDATION (Action Taken): A daily protime for three days has been ordered to meet the [facility] National Patient safety goal and comply with the current [place].   The Pharmacy will defer all warfarin dose order changes and follow up of lab results to the prescriber unless an additional order to initiate a "pharmacy Coumadin consult" is placed.  DESCRIPTION:  While hospitalized, to be in compliance with [facility] Patient Safety Goals, all patients on warfarin must have a baseline and/or current protime prior to the administration of warfarin. Pharmacy has received your order for warfarin without these required laboratory assessments.  Thank you for involving pharmacy in this patient's care.  Renold Genta, PharmD, BCPS Clinical Pharmacist Clinical phone for 05/06/2019 until 3p is [phone] 05/06/2019 8:45 AM  **Pharmacist phone directory can be found on [email or web address] listed under [place]**

## 2019-05-06 NOTE — ED Notes (Signed)
ED TO INPATIENT HANDOFF REPORT  ED Nurse Name and Phone #: Raysa Bosak [phone]  S Name/Age/Gender [name]. 85 y.o. male Room/Bed: 015C/015C  Code Status   Code Status: Full Code  Home/SNF/Other Home Patient oriented to: self, place, time and situation Is this baseline? Yes   Triage Complete: Triage complete  Chief Complaint diarrhea  Triage Note Patient reports diarrhea this week , denies fever , no abdominal pain , pt. stated diarrhea started after taking prescription Colchicine for his gout . Hypotensive at triage .   Allergies Allergies  Allergen Reactions  . Tramadol Itching    Level of Care/Admitting Diagnosis ED Disposition    ED Disposition Condition [facility] Area: [place] [[id number]]  Level of Care: Telemetry Medical [104]  Covid Evaluation: Asymptomatic Screening Protocol (No Symptoms)  Diagnosis: ARF (acute renal failure) [facility]) [id number]  Admitting Physician: Jani Gravel [phone]  Attending Physician: Jani Gravel [phone]  Estimated length of stay: past midnight tomorrow  Certification:: I certify this patient will need inpatient services for at least 2 midnights  PT Class (Do Not Modify): Inpatient [101]  PT Acc Code (Do Not Modify): Private [1]       B Medical/Surgery History Past Medical History:  Diagnosis Date  . A-fib ([place])   . Arthritis   . CAD (coronary artery disease)    GXT, neg bruce protocol GXT  . Chronic kidney disease    RENAL INSUFFICIENCY  . Diverticulosis   . ED (erectile dysfunction)   . GERD (gastroesophageal reflux disease)   . Glaucoma   . Gout    no flare in years   . Heart disorder   . Hypertension   . Obesity    Past Surgical History:  Procedure Laterality Date  . CATARACT EXTRACTION, BILATERAL  2018   with lens placement   . PACEMAKER INSERTION  01/09/03  . TONSILLECTOMY    . TOTAL KNEE ARTHROPLASTY Right 02/27/2018   Procedure: RIGHT TOTAL KNEE ARTHROPLASTY;  Surgeon: Paralee Cancel, MD;  Location: [facility];  Service: Orthopedics;  Laterality: Right;  70 mins  . TOTAL KNEE ARTHROPLASTY Left 09/06/2018   Procedure: LEFT TOTAL KNEE ARTHROPLASTY;  Surgeon: Paralee Cancel, MD;  Location: [facility];  Service: Orthopedics;  Laterality: Left;  70 mins     A IV Location/Drains/Wounds Patient Lines/Drains/Airways Status   Active Line/Drains/Airways    Name:   Placement date:   Placement time:   Site:   Days:   Peripheral IV 05/06/19 Right Hand   05/06/19    0148    Hand   less than 1   Incision (Closed) 09/06/18 Knee Left   09/06/18    1007     242          Intake/Output Last 24 hours No intake or output data in the 24 hours ending 05/06/19 0319  Labs/Imaging Results for orders placed or performed during the hospital encounter of 05/05/19 (from the past 48 hour(s))  CBC with Differential     Status: Abnormal   Collection Time: 05/05/19  9:45 PM  Result Value Ref Range   WBC 13.1 (H) 4.0 - 10.5 K/uL   RBC 4.11 (L) 4.22 - 5.81 MIL/uL   Hemoglobin 12.4 (L) 13.0 - 17.0 g/dL   HCT 38.4 (L) 39.0 - 52.0 %   MCV 93.4 80.0 - 100.0 fL   MCH 30.2 26.0 - 34.0 pg   MCHC 32.3 30.0 - 36.0 g/dL   RDW  14.3 11.5 - 15.5 %   Platelets 151 150 - 400 K/uL   nRBC 0.0 0.0 - 0.2 %   Neutrophils Relative % 82 %   Neutro Abs 10.8 (H) 1.7 - 7.7 K/uL   Lymphocytes Relative 5 %   Lymphs Abs 0.6 (L) 0.7 - 4.0 K/uL   Monocytes Relative 12 %   Monocytes Absolute 1.5 (H) 0.1 - 1.0 K/uL   Eosinophils Relative 0 %   Eosinophils Absolute 0.0 0.0 - 0.5 K/uL   Basophils Relative 0 %   Basophils Absolute 0.1 0.0 - 0.1 K/uL   Immature Granulocytes 1 %   Abs Immature Granulocytes 0.06 0.00 - 0.07 K/uL    Comment: Performed at [facility] [address] [place], [place] [address]  Comprehensive metabolic panel     Status: Abnormal   Collection Time: 05/05/19  9:45 PM  Result Value Ref Range   Sodium 138 135 - 145 mmol/L   Potassium 3.7 3.5 - 5.1 mmol/L   Chloride 105 98 - 111 mmol/L    CO2 21 (L) 22 - 32 mmol/L   Glucose, Bld 114 (H) 70 - 99 mg/dL   BUN 31 (H) 8 - 23 mg/dL   Creatinine, Ser 2.59 (H) 0.61 - 1.24 mg/dL   Calcium 9.0 8.9 - 10.3 mg/dL   Total Protein 5.9 (L) 6.5 - 8.1 g/dL   Albumin 3.2 (L) 3.5 - 5.0 g/dL   AST 19 15 - 41 U/L   ALT 11 0 - 44 U/L   Alkaline Phosphatase 61 38 - 126 U/L   Total Bilirubin 1.3 (H) 0.3 - 1.2 mg/dL   GFR calc non Af Amer 21 (L) >60 mL/min   GFR calc Af Amer 25 (L) >60 mL/min   Anion gap 12 5 - 15    Comment: Performed at [facility], [place] [address] [place], [place] [address]  Lactic acid, plasma     Status: None   Collection Time: 05/06/19  1:17 AM  Result Value Ref Range   Lactic Acid, Venous 1.8 0.5 - 1.9 mmol/L    Comment: Performed at [facility] [address] [place], [place] [address]   No results found.  Pending Labs Unresulted Labs (From admission, onward)    Start     Ordered   05/13/19 0500  Creatinine, serum  (enoxaparin (LOVENOX)    CrCl < 30 ml/min)  Weekly,   R    Comments: while on enoxaparin therapy.    05/06/19 0303   05/06/19 0500  Comprehensive metabolic panel  Tomorrow morning,   R     05/06/19 0303   05/06/19 0500  CBC  Tomorrow morning,   R     05/06/19 0303   05/06/19 0117  Protime-INR  ONCE - STAT,   STAT     05/06/19 0116   05/06/19 0117  SARS CORONAVIRUS 2 Nasal Swab Aptima Multi Swab  (Asymptomatic/Tier 2 Patients Labs)  Once,   STAT    Question Answer Comment  Is this test for diagnosis or screening Screening   Symptomatic for COVID-19 as defined by CDC No   Hospitalized for COVID-19 No   Admitted to ICU for COVID-19 No   Previously tested for COVID-19 No   Resident in a congregate (group) care setting No   Employed in healthcare setting No      05/06/19 0116          Vitals/Pain Today's Vitals   05/05/19 2112  05/05/19 2137 05/05/19 2324 05/06/19 0215  BP: (!) 89/65  (!) 96/51 (!) 91/51  Pulse: (!) 109  80 82  Resp: 20  18   Temp: 99 F (37.2 C)  98.5  F (36.9 C)   TempSrc: Oral  Oral   SpO2: 100%  98% 97%  PainSc:  0-No pain      Isolation Precautions No active isolations  Medications Medications  sodium chloride 0.9 % bolus 1,000 mL (has no administration in time range)  enoxaparin (LOVENOX) injection 30 mg (has no administration in time range)  acetaminophen (TYLENOL) tablet 650 mg (has no administration in time range)    Or  acetaminophen (TYLENOL) suppository 650 mg (has no administration in time range)  0.9 %  sodium chloride infusion (has no administration in time range)  sodium bicarbonate injection 50 mEq (has no administration in time range)  sodium chloride 0.9 % bolus 1,000 mL (1,000 mLs Intravenous New Bag/Given 05/06/19 0148)    Mobility walks Moderate fall risk   Focused Assessments Renal Assessment Handoff:           R Recommendations: See Admitting Provider Note  Report given to:   Additional Notes: Covid swab pending

## 2019-05-06 NOTE — ED Notes (Signed)
Dr. Kim at bedside   

## 2019-05-06 NOTE — H&P (Signed)
TRH H&P    Patient Demographics:    [name], is a 85 y.o. male  MRN: [id number]  DOB - May 21, 1931  Admit Date - 05/05/2019  Referring MD/NP/PA: Ripley Fraise  Outpatient Primary MD for the patient is [name], MD  Patient coming from:  home  Chief complaint-  Diarrhea, dehydration   HPI:    [name]  is a 85 y.o. male, w hypertension, CKD stage3, h/o iron def anemia, CAD, PAfib on coumadin,Gout, apparently recently placed on colchicine about 1.5 weeks ago. Pt was taking twice a day, and it helped his gout but cause diarrhea 5 times per day. Pt denies fever, chills, cough, cp, palp, sob, n/v, abd pain,brbpr, black stool.  Pt presented to ED due to diarrhea  In ED, pt hypotensive.   Temperature 99 pulse 109 respiratory rate 20 blood pressure 89/65, pulse ox 100% on room air  WBC 13.1, hemoglobin 12.4, platelet count 151 Sodium 138, potassium 3.7 BUN 31, creatinine 2.59 Albumin 3.2 AST 19 ALT 11 alkaline phosphatase 61 total bilirubin 1.3 Lactic acid 1.8  Patient will be admitted for acute renal failure secondary to diarrhea secondary to colchicine .     Review of systems:    In addition to the HPI above,  No Fever-chills, No Headache, No changes with Vision or hearing, No problems swallowing food or Liquids, No Chest pain, Cough or Shortness of Breath, No Abdominal pain, No Nausea or Vomiting,  No Blood in stool or Urine, No dysuria, No new skin rashes or bruises, No new joints pains-aches,  No new weakness, tingling, numbness in any extremity, No recent weight gain or loss, No polyuria, polydypsia or polyphagia, No significant Mental Stressors.  All other systems reviewed and are negative.    Past History of the following :    Past Medical History:  Diagnosis Date  . A-fib ([place])   . Arthritis   . CAD (coronary artery disease)    GXT, neg bruce protocol GXT  .  Chronic kidney disease    RENAL INSUFFICIENCY  . Diverticulosis   . ED (erectile dysfunction)   . GERD (gastroesophageal reflux disease)   . Glaucoma   . Gout    no flare in years   . Heart disorder   . Hypertension   . Obesity       Past Surgical History:  Procedure Laterality Date  . CATARACT EXTRACTION, BILATERAL  2018   with lens placement   . PACEMAKER INSERTION  01/09/03  . TONSILLECTOMY    . TOTAL KNEE ARTHROPLASTY Right 02/27/2018   Procedure: RIGHT TOTAL KNEE ARTHROPLASTY;  Surgeon: Paralee Cancel, MD;  Location: [facility];  Service: Orthopedics;  Laterality: Right;  70 mins  . TOTAL KNEE ARTHROPLASTY Left 09/06/2018   Procedure: LEFT TOTAL KNEE ARTHROPLASTY;  Surgeon: Paralee Cancel, MD;  Location: [facility];  Service: Orthopedics;  Laterality: Left;  70 mins      Social History:      Social History   Tobacco Use  . Smoking status: Former Smoker  Packs/day: 1.00    Years: 10.00    Pack years: 10.00    Quit date: 09/13/1979    Years since quitting: 39.6  . Smokeless tobacco: Never Used  Substance Use Topics  . Alcohol use: Yes    Comment: Consumes 2-3 glasses of wine per week; now only socially        Family History :     Family History  Problem Relation Age of Onset  . Heart disease Mother   . Hypertension Mother   . Kidney disease Mother   . Stroke Mother   . Heart disease Father       Home Medications:   Prior to Admission medications   Medication Sig Start Date End Date Taking? Authorizing Provider  acetaminophen (TYLENOL) 500 MG tablet Take 500 mg by mouth every 6 (six) hours as needed (for pain).    Yes [provider]  benazepril-hydrochlorthiazide (LOTENSIN HCT) 20-12.5 MG tablet TAKE 1 TABLET BY MOUTH  DAILY Patient taking differently: Take 1 tablet by mouth daily.  01/28/19  Yes [name], MD  carvedilol (COREG) 3.125 MG tablet TAKE 1 TABLET BY MOUTH TWO  TIMES DAILY Patient taking differently: Take 3.125 mg by mouth 2 (two)  times daily with a meal.  10/17/18  Yes Evans Lance, MD  Cholecalciferol (VITAMIN D3) 1000 units CAPS Take 1,000 Units by mouth daily.   Yes [provider]  colchicine 0.6 MG tablet Take 1 tablet (0.6 mg total) by mouth 2 (two) times daily. 04/18/19  Yes [name], MD  doxazosin (CARDURA) 4 MG tablet TAKE 1 TABLET BY MOUTH AT  BEDTIME Patient taking differently: Take 4 mg by mouth at bedtime.  10/17/18  Yes Evans Lance, MD  finasteride (PROSCAR) 5 MG tablet Take 1 tablet (5 mg total) by mouth daily. 03/21/19  Yes [name], MD  latanoprost (XALATAN) 0.005 % ophthalmic solution Place 1 drop into both eyes at bedtime.  06/12/17  Yes [provider]  Multiple Vitamins-Minerals (PRESERVISION AREDS 2 PO) Take 1 capsule by mouth 2 (two) times daily.    Yes [provider]  potassium chloride SA (K-DUR) 20 MEQ tablet TAKE ONE-HALF TABLET BY  MOUTH DAILY Patient taking differently: Take 10 mEq by mouth daily.  04/10/19  Yes [name], MD  simvastatin (ZOCOR) 20 MG tablet TAKE 1 TABLET BY MOUTH AT  BEDTIME Patient taking differently: Take 20 mg by mouth at bedtime.  03/11/19  Yes [name], MD  warfarin (COUMADIN) 5 MG tablet TAKE 1/2 TABLET BY MOUTH  DAILY OR AS DIRECTED BY  COUMADIN CLINIC Patient taking differently: Take 2.5 mg by mouth every evening. TAKE 1/2 TABLET BY MOUTH  DAILY OR AS DIRECTED BY  COUMADIN CLINIC 03/11/19  Yes Evans Lance, MD  albuterol (PROVENTIL HFA;VENTOLIN HFA) 108 (90 Base) MCG/ACT inhaler Inhale 2 puffs into the lungs every 6 (six) hours as needed for wheezing or shortness of breath. Patient not taking: Reported on 03/21/2019 09/20/18   Norval Morton, MD  ferrous sulfate (FERROUSUL) 325 (65 FE) MG tablet Take 1 tablet (325 mg total) by mouth 3 (three) times daily with meals. Patient not taking: Reported on 09/17/2018 09/06/18   Danae Orleans, PA-C  methocarbamol (ROBAXIN) 500 MG tablet Take 1 tablet (500 mg total) by mouth  every 6 (six) hours as needed for muscle spasms. Patient not taking: Reported on 09/17/2018 09/06/18   Danae Orleans, PA-C     Allergies:  Allergies  Allergen Reactions  . Tramadol Itching     Physical Exam:   Vitals  Blood pressure 99/65, pulse 82, temperature 98.5 F (36.9 C), temperature source Oral, resp. rate 18, SpO2 97 %.  1.  General: Alert and oriented x3  2. Psychiatric: Euthymic  3. Neurologic: Nonfocal  4. HEENMT:  Anicteric, pupils 1.5 mm, symmetric, direct, consensual, near reflexes intact Neck no JVD  5. Respiratory : CTAB  6. Cardiovascular : Irr, irr,  S1-S2 2 out of 6 systolic ejection murmur right upper sternal border  7. Gastrointestinal:  Abdomen: Soft nontender nondistended positive bowel sound  8. Skin:  Extremities: No cyanosis clubbing or edema  9.Musculoskeletal:  Range of motion    Data Review:    CBC Recent Labs  Lab 05/05/19 2145  WBC 13.1*  HGB 12.4*  HCT 38.4*  PLT 151  MCV 93.4  MCH 30.2  MCHC 32.3  RDW 14.3  LYMPHSABS 0.6*  MONOABS 1.5*  EOSABS 0.0  BASOSABS 0.1   ------------------------------------------------------------------------------------------------------------------  Results for orders placed or performed during the hospital encounter of 05/05/19 (from the past 48 hour(s))  CBC with Differential     Status: Abnormal   Collection Time: 05/05/19  9:45 PM  Result Value Ref Range   WBC 13.1 (H) 4.0 - 10.5 K/uL   RBC 4.11 (L) 4.22 - 5.81 MIL/uL   Hemoglobin 12.4 (L) 13.0 - 17.0 g/dL   HCT 38.4 (L) 39.0 - 52.0 %   MCV 93.4 80.0 - 100.0 fL   MCH 30.2 26.0 - 34.0 pg   MCHC 32.3 30.0 - 36.0 g/dL   RDW 14.3 11.5 - 15.5 %   Platelets 151 150 - 400 K/uL   nRBC 0.0 0.0 - 0.2 %   Neutrophils Relative % 82 %   Neutro Abs 10.8 (H) 1.7 - 7.7 K/uL   Lymphocytes Relative 5 %   Lymphs Abs 0.6 (L) 0.7 - 4.0 K/uL   Monocytes Relative 12 %   Monocytes Absolute 1.5 (H) 0.1 - 1.0 K/uL   Eosinophils Relative  0 %   Eosinophils Absolute 0.0 0.0 - 0.5 K/uL   Basophils Relative 0 %   Basophils Absolute 0.1 0.0 - 0.1 K/uL   Immature Granulocytes 1 %   Abs Immature Granulocytes 0.06 0.00 - 0.07 K/uL    Comment: Performed at [facility], [address] [place], [place] [address]  Comprehensive metabolic panel     Status: Abnormal   Collection Time: 05/05/19  9:45 PM  Result Value Ref Range   Sodium 138 135 - 145 mmol/L   Potassium 3.7 3.5 - 5.1 mmol/L   Chloride 105 98 - 111 mmol/L   CO2 21 (L) 22 - 32 mmol/L   Glucose, Bld 114 (H) 70 - 99 mg/dL   BUN 31 (H) 8 - 23 mg/dL   Creatinine, Ser 2.59 (H) 0.61 - 1.24 mg/dL   Calcium 9.0 8.9 - 10.3 mg/dL   Total Protein 5.9 (L) 6.5 - 8.1 g/dL   Albumin 3.2 (L) 3.5 - 5.0 g/dL   AST 19 15 - 41 U/L   ALT 11 0 - 44 U/L   Alkaline Phosphatase 61 38 - 126 U/L   Total Bilirubin 1.3 (H) 0.3 - 1.2 mg/dL   GFR calc non Af Amer 21 (L) >60 mL/min   GFR calc Af Amer 25 (L) >60 mL/min   Anion gap 12 5 - 15    Comment: Performed at [facility], [address] [place], [place] [address]  Lactic acid, plasma     Status: None   Collection Time: 05/06/19  1:17 AM  Result Value Ref Range   Lactic Acid, Venous 1.8 0.5 - 1.9 mmol/L    Comment: Performed at [facility] [address] [place], [place] [address]    Chemistries  Recent Labs  Lab 05/05/19 2145  NA 138  K 3.7  CL 105  CO2 21*  GLUCOSE 114*  BUN 31*  CREATININE 2.59*  CALCIUM 9.0  AST 19  ALT 11  ALKPHOS 61  BILITOT 1.3*   ------------------------------------------------------------------------------------------------------------------  ------------------------------------------------------------------------------------------------------------------ GFR: CrCl cannot be calculated (Unknown ideal weight.). Liver Function Tests: Recent Labs  Lab 05/05/19 2145  AST 19  ALT 11  ALKPHOS 61  BILITOT 1.3*  PROT 5.9*  ALBUMIN 3.2*   No results for input(s): LIPASE,  AMYLASE in the last 168 hours. No results for input(s): AMMONIA in the last 168 hours. Coagulation Profile: No results for input(s): INR, PROTIME in the last 168 hours. Cardiac Enzymes: No results for input(s): CKTOTAL, CKMB, CKMBINDEX, TROPONINI in the last 168 hours. BNP (last 3 results) No results for input(s): PROBNP in the last 8760 hours. HbA1C: No results for input(s): HGBA1C in the last 72 hours. CBG: No results for input(s): GLUCAP in the last 168 hours. Lipid Profile: No results for input(s): CHOL, HDL, LDLCALC, TRIG, CHOLHDL, LDLDIRECT in the last 72 hours. Thyroid Function Tests: No results for input(s): TSH, T4TOTAL, FREET4, T3FREE, THYROIDAB in the last 72 hours. Anemia Panel: No results for input(s): VITAMINB12, FOLATE, FERRITIN, TIBC, IRON, RETICCTPCT in the last 72 hours.  --------------------------------------------------------------------------------------------------------------- Urine analysis:    Component Value Date/Time   COLORURINE YELLOW 09/17/2018 2215   APPEARANCEUR CLEAR 09/17/2018 2215   LABSPEC 1.024 09/17/2018 2215   PHURINE 5.0 09/17/2018 2215   [place] 09/17/2018 2215   HGBUR NEGATIVE 09/17/2018 2215   BILIRUBINUR NEGATIVE 09/17/2018 2215   KETONESUR 5 (A) 09/17/2018 2215   PROTEINUR NEGATIVE 09/17/2018 2215   NITRITE NEGATIVE 09/17/2018 2215   LEUKOCYTESUR NEGATIVE 09/17/2018 2215      Imaging Results:    No results found.     Assessment & Plan:    Principal Problem:   ARF (acute renal failure) (HCC) Active Problems:   Hypertension   CKD (chronic kidney disease) stage 3, GFR 30-59 ml/min (HCC)   GERD (gastroesophageal reflux disease)   CAD (coronary artery disease)   A-fib (HCC)   Iron deficiency anemia   Diarrhea  Acute renal failure Check cpk Stop benazepril hydrochlorothiazide  check renal ultrasound Hydrate with normal saline IV Check cmp in am  Hypotension Likely secondary to dehydration Check trop I  q2h x2 Check cardiac echo Stop benazepril hydrochlorothiazide as above  Hypertension Continue carvedilol 3.125 mg p.o. twice daily if blood pressure will allow  CAD Continue carvedilol as above Continue simvastatin 20 mg po qhs  Atrial fibrillation Continue carvedilol as above Coumadin pharmacy to dose, Coumadin will initially have to be held due to supratherapeutic INR  Supratherapeutic INR Likely due to diarrhea and colchicine Pharmacy to monitor  BPH Continue Cardura 4 mg po qhs Continue Proscar 5 mg po qday  Gout STOP Colchicine       DVT Prophylaxis-   Coumadin, pharmacy to dose  AM Labs Ordered, also please review Full Orders  Family Communication: Admission, patients condition and plan of care including tests being ordered have been discussed with the patient and daughter who indicate understanding  and agree with the plan and Code Status.  Code Status:  FULL CODE< notified daughter that pt will be admitted to [facility]  Admission status: Inpatient: Based on patients clinical presentation and evaluation of above clinical data, I have made determination that patient meets Inpatient criteria at this time.  Pt has significant ARF on CRF,  Pt will require iv hydration as well as holding his benazepril hydrochlorothiazide.  Note patient hypotensive on admission.  Pt has high risk of clinical deterioration.  Pt will require > 2 nites stay,    Time spent in minutes : 70   Jani Gravel M.D on 05/06/2019 at 3:51 AM

## 2019-05-07 DIAGNOSIS — E876 Hypokalemia: Secondary | ICD-10-CM

## 2019-05-07 DIAGNOSIS — R197 Diarrhea, unspecified: Secondary | ICD-10-CM

## 2019-05-07 DIAGNOSIS — R791 Abnormal coagulation profile: Secondary | ICD-10-CM

## 2019-05-07 LAB — CBC
HCT: 32.7 % — ABNORMAL LOW (ref 39.0–52.0)
Hemoglobin: 10.5 g/dL — ABNORMAL LOW (ref 13.0–17.0)
MCH: 30.1 pg (ref 26.0–34.0)
MCHC: 32.1 g/dL (ref 30.0–36.0)
MCV: 93.7 fL (ref 80.0–100.0)
Platelets: 127 10*3/uL — ABNORMAL LOW (ref 150–400)
RBC: 3.49 MIL/uL — ABNORMAL LOW (ref 4.22–5.81)
RDW: 14.3 % (ref 11.5–15.5)
WBC: 7.2 10*3/uL (ref 4.0–10.5)
nRBC: 0 % (ref 0.0–0.2)

## 2019-05-07 LAB — BASIC METABOLIC PANEL
Anion gap: 6 (ref 5–15)
BUN: 32 mg/dL — ABNORMAL HIGH (ref 8–23)
CO2: 21 mmol/L — ABNORMAL LOW (ref 22–32)
Calcium: 7.8 mg/dL — ABNORMAL LOW (ref 8.9–10.3)
Chloride: 113 mmol/L — ABNORMAL HIGH (ref 98–111)
Creatinine, Ser: 1.96 mg/dL — ABNORMAL HIGH (ref 0.61–1.24)
GFR calc Af Amer: 35 mL/min — ABNORMAL LOW (ref >60)
GFR calc non Af Amer: 30 mL/min — ABNORMAL LOW (ref >60)
Glucose, Bld: 83 mg/dL (ref 70–99)
Potassium: 3.1 mmol/L — ABNORMAL LOW (ref 3.5–5.1)
Sodium: 140 mmol/L (ref 135–145)

## 2019-05-07 LAB — PROTIME-INR
INR: 4.9 (ref 0.8–1.2)
Prothrombin Time: 45.1 seconds — ABNORMAL HIGH (ref 11.4–15.2)

## 2019-05-07 LAB — PHOSPHORUS: Phosphorus: 2.9 mg/dL (ref 2.5–4.6)

## 2019-05-07 LAB — MAGNESIUM: Magnesium: 1.4 mg/dL — ABNORMAL LOW (ref 1.7–2.4)

## 2019-05-07 MED ORDER — POTASSIUM CHLORIDE CRYS ER 20 MEQ PO TBCR
40.0000 meq | EXTENDED_RELEASE_TABLET | ORAL | Status: AC
  Administered 2019-05-07 (×2): 40 meq via ORAL
  Filled 2019-05-07 (×2): qty 2

## 2019-05-07 MED ORDER — MAGNESIUM SULFATE 2 GM/50ML IV SOLN
2.0000 g | Freq: Once | INTRAVENOUS | Status: AC
  Administered 2019-05-07: 2 g via INTRAVENOUS
  Filled 2019-05-07: qty 50

## 2019-05-07 MED ORDER — LOPERAMIDE HCL 2 MG PO CAPS
2.0000 mg | ORAL_CAPSULE | ORAL | Status: DC | PRN
  Administered 2019-05-07 (×3): 2 mg via ORAL
  Filled 2019-05-07 (×4): qty 1

## 2019-05-07 NOTE — Plan of Care (Signed)

## 2019-05-07 NOTE — Progress Notes (Signed)
PROGRESS NOTE  [name]. [id number] DOB: Sep 17, 1930   PCP: [name], MD  Patient is from: Home.  DOA: 05/05/2019 LOS: 1  Brief Narrative / Interim history: Briefly, 85 y.o. male with history of HTN, CKD, iron deficiency anemia, CAD, paroxysmal A. fib on Coumadin and gout presenting with diarrhea and hypotension after he was started on colchicine about 1.5 ago for his gout.  He was admitted for AKI on CKD-3 in the setting of diarrhea as above and hypotension.  Started on IV fluid.  Assessment & Plan: Diarrhea likely due to colchicine: Abdominal exam is benign.  Had mild leukocytosis but no fever.  Doubt infectious process.  Diarrhea improving. -Continue IV fluid hydration. -Colchicine discontinued.  Hypotension: Normotensive this morning. -IV hydration as above  AKI on CKD-3: Creatinine peaked at 2.7 down to 1.96.  Baseline 1.5.  Renal US consistent with CKD without acute finding. -IV fluid hydration -Recheck renal function in the morning. -Avoid nephrotoxic meds.  Hypokalemia/hypomagnesemia -Replenish and recheck.  Gout flare: seems to have resolved.  He is not in pain now. -Colchicine discontinued due to diarrhea. -We will continue monitoring  Paroxysmal A. Fib/supratherapeutic INR: Rate controlled on Coreg.  On warfarin for anticoagulation.  INR supratherapeutic at 4.9. -Pharmacy managing warfarin.  Hypertension: Hypotensive on admission.  Now normotensive. -Continue home Coreg and doxazosin -Hold home benazepril/HCTZ in the setting of AKI and hypotension.  Normocytic anemia: Likely combination of anemia of chronic disease and iron deficiency.  Denies bleeding. -Check anemia panel  History of CAD: Stable.  No anginal symptoms. -Continue home Coreg and statin  BPH: No urinary symptoms. -Continue home meds  DVT prophylaxis: Supratherapeutic INR Code Status: Full code Family Communication: Patient and/or RN. Available if any question.   Disposition Plan: Remains inpatient due to significant electrolyte derangements and supratherapeutic INR which is worse.  Renal function also not quite back to baseline. Consultants: None  Procedures:  None  Microbiology summarized: SARS-CoV-2 screen negative.  Antimicrobials: Anti-infectives (From admission, onward)   None      Sch Meds:  Scheduled Meds: . carvedilol  3.125 mg Oral BID WC  . cholecalciferol  1,000 Units Oral Daily  . doxazosin  4 mg Oral QHS  . finasteride  5 mg Oral Daily  . latanoprost  1 drop Both Eyes QHS  . multivitamin  1 tablet Oral BID  . potassium chloride  40 mEq Oral Q4H  . simvastatin  20 mg Oral QHS  . Warfarin - Pharmacist Dosing Inpatient   Does not apply q1800   Continuous Infusions: PRN Meds:.acetaminophen **OR** acetaminophen   Subjective: No major events overnight of this morning.  Still with some watery diarrhea but decreased frequency.  No other complaints.  He denies chest pain, dyspnea, nausea, vomiting, abdominal pain or urinary symptoms.  He denies lightheadedness.  Objective: Vitals:   05/07/19 0024 05/07/19 0457 05/07/19 0459 05/07/19 0814  BP: 102/67  (!) 111/94 95/64  Pulse: 80  81 86  Resp: 14  15 18   Temp: 97.8 F (36.6 C)  97.9 F (36.6 C) (!) 97.5 F (36.4 C)  TempSrc: Oral  Oral Oral  SpO2: 96%  98% 100%  Weight:  82.3 kg      Intake/Output Summary (Last 24 hours) at 05/07/2019 1137 Last data filed at 05/07/2019 0807 Gross per 24 hour  Intake 2030.64 ml  Output 0 ml  Net 2030.64 ml   Filed Weights   05/07/19 0457  Weight: 82.3 kg  Examination:  GENERAL: No acute distress.  Appears well.  HEENT: MMM.  Vision and hearing grossly intact.  NECK: Supple.  No apparent JVD.  RESP:  No IWOB. Good air movement bilaterally. CVS:  RRR. Heart sounds normal.  ABD/GI/GU: Bowel sounds present. Soft. Non tender.  MSK/EXT:  Moves extremities. No apparent deformity or edema.  SKIN: no apparent skin lesion or  wound NEURO: Awake, alert and oriented appropriately.  No gross deficit.  PSYCH: Calm. Normal affect.   I have personally reviewed the following labs and images: CBC: Recent Labs  Lab 05/05/19 2145 05/06/19 0520 05/07/19 0436  WBC 13.1* 11.4* 7.2  NEUTROABS 10.8*  --   --   HGB 12.4* 11.2* 10.5*  HCT 38.4* 35.6* 32.7*  MCV 93.4 96.2 93.7  PLT 151 149* 127*   BMP &GFR Recent Labs  Lab 05/05/19 2145 05/06/19 0520 05/07/19 0436  NA 138 142 140  K 3.7 4.0 3.1*  CL 105 108 113*  CO2 21* 21* 21*  GLUCOSE 114* 98 83  BUN 31* 33* 32*  CREATININE 2.59* 2.60* 1.96*  CALCIUM 9.0 8.2* 7.8*  MG  --   --  1.4*  PHOS  --   --  2.9   CrCl cannot be calculated (Unknown ideal weight.). Liver & Pancreas: Recent Labs  Lab 05/05/19 2145 05/06/19 0520  AST 19 22  ALT 11 6  ALKPHOS 61 51  BILITOT 1.3* 1.5*  PROT 5.9* 4.9*  ALBUMIN 3.2* 2.8*   No results for input(s): LIPASE, AMYLASE in the last 168 hours. No results for input(s): AMMONIA in the last 168 hours. Diabetic: No results for input(s): HGBA1C in the last 72 hours. No results for input(s): GLUCAP in the last 168 hours. Cardiac Enzymes: No results for input(s): CKTOTAL, CKMB, CKMBINDEX, TROPONINI in the last 168 hours. No results for input(s): PROBNP in the last 8760 hours. Coagulation Profile: Recent Labs  Lab 05/06/19 0150 05/07/19 0436  INR 4.3* 4.9*   Thyroid Function Tests: No results for input(s): TSH, T4TOTAL, FREET4, T3FREE, THYROIDAB in the last 72 hours. Lipid Profile: No results for input(s): CHOL, HDL, LDLCALC, TRIG, CHOLHDL, LDLDIRECT in the last 72 hours. Anemia Panel: No results for input(s): VITAMINB12, FOLATE, FERRITIN, TIBC, IRON, RETICCTPCT in the last 72 hours. Urine analysis:    Component Value Date/Time   COLORURINE YELLOW 09/17/2018 2215   APPEARANCEUR CLEAR 09/17/2018 2215   LABSPEC 1.024 09/17/2018 2215   PHURINE 5.0 09/17/2018 2215   [place] 09/17/2018 2215   HGBUR  NEGATIVE 09/17/2018 2215   BILIRUBINUR NEGATIVE 09/17/2018 2215   KETONESUR 5 (A) 09/17/2018 2215   PROTEINUR NEGATIVE 09/17/2018 2215   NITRITE NEGATIVE 09/17/2018 2215   LEUKOCYTESUR NEGATIVE 09/17/2018 2215   Sepsis Labs: Invalid input(s): PROCALCITONIN, [place]  Microbiology: Recent Results (from the past 240 hour(s))  SARS CORONAVIRUS 2 Nasal Swab Aptima Multi Swab     Status: None   Collection Time: 05/06/19  1:17 AM   Specimen: Aptima Multi Swab; Nasal Swab  Result Value Ref Range Status   SARS Coronavirus 2 NEGATIVE NEGATIVE Final    Comment: (NOTE) SARS-CoV-2 target nucleic acids are NOT DETECTED. The SARS-CoV-2 RNA is generally detectable in upper and lower respiratory specimens during the acute phase of infection. Negative results do not preclude SARS-CoV-2 infection, do not rule out co-infections with other pathogens, and should not be used as the sole basis for treatment or other patient management decisions. Negative results must be combined with clinical observations, patient history, and  epidemiological information. The expected result is Negative. Fact Sheet for Patients: SugarRoll.be Fact Sheet for Healthcare Providers: https://www.woods-mathews.com/ This test is not yet approved or cleared by the [place] FDA and  has been authorized for detection and/or diagnosis of SARS-CoV-2 by FDA under an Emergency Use Authorization (EUA). This EUA will remain  in effect (meaning this test can be used) for the duration of the COVID-19 declaration under Section 56 4(b)(1) of the Act, 21 U.S.C. section 360bbb-3(b)(1), unless the authorization is terminated or revoked sooner. Performed at [facility], [place] [address], [place], [place] [address]     Radiology Studies: No results found.   Yolette Hastings T. Circle  If 7PM-7AM, please contact night-coverage [email or web address] Password [facility] 05/07/2019, 11:37  AM

## 2019-05-07 NOTE — Progress Notes (Signed)
[place] for warfarin Indication: atrial fibrillation  Allergies  Allergen Reactions  . Tramadol Itching    Patient Measurements: Weight: 181 lb 8 oz (82.3 kg)(scale a)  Vital Signs: Temp: 97.9 F (36.6 C) (08/25 0459) Temp Source: Oral (08/25 0459) BP: 111/94 (08/25 0459) Pulse Rate: 81 (08/25 0459)  Labs: Recent Labs    05/05/19 2145 05/06/19 0150 05/06/19 0520 05/07/19 0436  HGB 12.4*  --  11.2* 10.5*  HCT 38.4*  --  35.6* 32.7*  PLT 151  --  149* 127*  LABPROT  --  40.3*  --  45.1*  INR  --  4.3*  --  4.9*  CREATININE 2.59*  --  2.60* 1.96*    CrCl cannot be calculated (Unknown ideal weight.).  Assessment: 85 y/o male on warfarin PTA for Afib admitted with AKI in setting of hypotension and diarrhea from colchicine use for gout. INR on admit was supratherapeutic at 4.3.   INR remains supratherapeutic at 4.9 this morning. No bleeding noted, Hgb 10.5 and likely dilutional, platelets down to 127.  PTA regimen: 2.5 mg daily, last 8/22  Goal of Therapy:  INR 2-3 Monitor platelets by anticoagulation protocol: Yes   Plan:  No warfarin tonight Daily INR Monitor for s/sx of bleeding   Thank you for involving pharmacy in this patient's care.  Renold Genta, PharmD, BCPS Clinical Pharmacist Clinical phone for 05/07/2019 until 3p is [phone] 05/07/2019 8:07 AM  **Pharmacist phone directory can be found on [email or web address] listed under [place]**

## 2019-05-08 ENCOUNTER — Ambulatory Visit (INDEPENDENT_AMBULATORY_CARE_PROVIDER_SITE_OTHER): Payer: Self-pay | Admitting: Pharmacist Clinician (PhC)/ Clinical Pharmacy Specialist

## 2019-05-08 DIAGNOSIS — N183 Chronic kidney disease, stage 3 (moderate): Secondary | ICD-10-CM

## 2019-05-08 DIAGNOSIS — I4821 Permanent atrial fibrillation: Secondary | ICD-10-CM

## 2019-05-08 DIAGNOSIS — Z7901 Long term (current) use of anticoagulants: Secondary | ICD-10-CM

## 2019-05-08 LAB — CBC
HCT: 33.8 % — ABNORMAL LOW (ref 39.0–52.0)
Hemoglobin: 10.9 g/dL — ABNORMAL LOW (ref 13.0–17.0)
MCH: 30.4 pg (ref 26.0–34.0)
MCHC: 32.2 g/dL (ref 30.0–36.0)
MCV: 94.2 fL (ref 80.0–100.0)
Platelets: 140 10*3/uL — ABNORMAL LOW (ref 150–400)
RBC: 3.59 MIL/uL — ABNORMAL LOW (ref 4.22–5.81)
RDW: 14.2 % (ref 11.5–15.5)
WBC: 9.8 10*3/uL (ref 4.0–10.5)
nRBC: 0 % (ref 0.0–0.2)

## 2019-05-08 LAB — BASIC METABOLIC PANEL
Anion gap: 8 (ref 5–15)
BUN: 27 mg/dL — ABNORMAL HIGH (ref 8–23)
CO2: 22 mmol/L (ref 22–32)
Calcium: 8.2 mg/dL — ABNORMAL LOW (ref 8.9–10.3)
Chloride: 110 mmol/L (ref 98–111)
Creatinine, Ser: 1.7 mg/dL — ABNORMAL HIGH (ref 0.61–1.24)
GFR calc Af Amer: 41 mL/min — ABNORMAL LOW (ref >60)
GFR calc non Af Amer: 36 mL/min — ABNORMAL LOW (ref >60)
Glucose, Bld: 95 mg/dL (ref 70–99)
Potassium: 3.9 mmol/L (ref 3.5–5.1)
Sodium: 140 mmol/L (ref 135–145)

## 2019-05-08 LAB — PROTIME-INR
INR: 4.4 (ref 0.8–1.2)
Prothrombin Time: 41.1 seconds — ABNORMAL HIGH (ref 11.4–15.2)

## 2019-05-08 LAB — MAGNESIUM: Magnesium: 1.6 mg/dL — ABNORMAL LOW (ref 1.7–2.4)

## 2019-05-08 LAB — PHOSPHORUS: Phosphorus: 2.2 mg/dL — ABNORMAL LOW (ref 2.5–4.6)

## 2019-05-08 MED ORDER — MAGNESIUM SULFATE 2 GM/50ML IV SOLN
2.0000 g | Freq: Once | INTRAVENOUS | Status: AC
  Administered 2019-05-08: 2 g via INTRAVENOUS
  Filled 2019-05-08: qty 50

## 2019-05-08 MED ORDER — LOPERAMIDE HCL 2 MG PO CAPS: 2.0000 mg | ORAL_CAPSULE | ORAL | 0 refills | Status: DC | PRN

## 2019-05-08 MED ORDER — ALLOPURINOL 100 MG PO TABS: 100.0000 mg | ORAL_TABLET | Freq: Every day | ORAL | 1 refills | Status: DC

## 2019-05-08 MED FILL — ALLOPURINOL 100 MG TABLET: 100 | 90 days supply | Qty: 90 | Fill #0

## 2019-05-08 MED FILL — LOPERAMIDE 2 MG CAPSULE: 2 | 30 days supply | Qty: 30 | Fill #0

## 2019-05-08 NOTE — TOC Transition Note (Signed)
Transition of Care [facility]) - CM/SW Discharge Note   Patient Details  Name: [name]. MRN: [id number] Date of Birth: Aug 10, 1931  [facility]) CM/SW Contact:  Zenon Mayo, RN Phone Number: 05/08/2019, 9:17 AM   Clinical Narrative:    For dc today, no needs.   Final next level of care: Home/Self Care Barriers to Discharge: No Barriers Identified   Patient Goals and CMS Choice Patient states their goals for this hospitalization and ongoing recovery are:: to get rid of diarrhea   Choice offered to / list presented to : NA  Discharge Placement                       Discharge Plan and Services In-house Referral: NA Discharge Planning Services: CM Consult Post Acute Care Choice: NA          DME Arranged: (NA)         HH Arranged: NA          Social Determinants of Health (SDOH) Interventions     Readmission Risk Interventions Readmission Risk Prevention Plan 05/08/2019  Transportation Screening Complete  PCP or Specialist Appt within 3-5 Days Complete  HRI or [place] Complete  Social Work Consult for [place] Planning/Counseling Complete  Palliative Care Screening Not Applicable  Medication Review Press photographer) Complete  Some recent data might be hidden

## 2019-05-08 NOTE — TOC Initial Note (Signed)
Transition of Care (TOC) - Initial/Assessment Note    Patient Details  Name: [name]. MRN: [id number] Date of Birth: 11-Mar-1931  Transition of Care [facility]) CM/SW Contact:    Zenon Mayo, RN Phone Number: 05/08/2019, 9:13 AM  Clinical Narrative:                 From home with daughter, he uses a walker and a cane.  He states he has no problem getting medications, he states his daughter will be transporting him home today.  He has no other needs.  Expected Discharge Plan: Home/Self Care Barriers to Discharge: No Barriers Identified   Patient Goals and CMS Choice Patient states their goals for this hospitalization and ongoing recovery are:: to get rid of his diarrhea   Choice offered to / list presented to : NA  Expected Discharge Plan and Services Expected Discharge Plan: Home/Self Care In-house Referral: NA Discharge Planning Services: CM Consult Post Acute Care Choice: NA Living arrangements for the past 2 months: Single Family Home Expected Discharge Date: 05/08/19               DME Arranged: (NA)         HH Arranged: NA          Prior Living Arrangements/Services Living arrangements for the past 2 months: Single Family Home Lives with:: Adult Children Patient language and need for interpreter reviewed:: Yes Do you feel safe going back to the place where you live?: Yes      Need for Family Participation in Patient Care: No (Comment) Care giver support system in place?: No (comment) Current home services: DME(cane and a rolling walker) Criminal Activity/Legal Involvement Pertinent to Current Situation/Hospitalization: No - Comment as needed  Activities of Daily Living Home Assistive Devices/Equipment: Eyeglasses, Environmental consultant (specify type), Shower chair with back, Wheelchair ADL Screening (condition at time of admission) Patient's cognitive ability adequate to safely complete daily activities?: Yes Is the patient deaf or have difficulty hearing?:  No Does the patient have difficulty seeing, even when wearing glasses/contacts?: No Does the patient have difficulty concentrating, remembering, or making decisions?: No Patient able to express need for assistance with ADLs?: Yes Does the patient have difficulty dressing or bathing?: No Independently performs ADLs?: Yes (appropriate for developmental age) Does the patient have difficulty walking or climbing stairs?: No Weakness of Legs: None Weakness of Arms/Hands: None  Permission Sought/Granted                  Emotional Assessment Appearance:: Appears stated age Attitude/Demeanor/Rapport: Gracious Affect (typically observed): Appropriate Orientation: : Oriented to Self, Oriented to Place, Oriented to  Time, Oriented to Situation Alcohol / Substance Use: Not Applicable Psych Involvement: No (comment)  Admission diagnosis:  Dehydration [E86.0] ARF (acute renal failure) (HCC) [N17.9] AKI (acute kidney injury) ([place]) [N17.9] Diarrhea, unspecified type [R19.7] Patient Active Problem List   Diagnosis Date Noted  . ARF (acute renal failure) ([place]) 05/06/2019  . Diarrhea 05/06/2019  . Benign prostatic hyperplasia with urinary hesitancy 03/21/2019  . Iron deficiency anemia 09/18/2018  . S/P Left TKA 09/06/2018  . S/P right TKA 02/27/2018  . Obesity   . GERD (gastroesophageal reflux disease)   . ED (erectile dysfunction)   . Diverticulosis   . CAD (coronary artery disease)   . A-fib ([place])   . CKD (chronic kidney disease) stage 3, GFR 30-59 ml/min (HCC) 01/13/2017  . Atherosclerosis 12/12/2016  . Arthritis 12/01/2014  . Abnormality of gait 02/05/2013  .  Long term (current) use of anticoagulants 12/29/2012  . Glaucoma 07/26/2011  . Hyperlipidemia LDL goal <70 07/26/2011  . Hypertension 07/26/2011  . BRADYCARDIA 12/26/2008  . PACEMAKER-[place] 12/26/2008   PCP:  [name], MD Pharmacy:   [id number] [place], [place] [address] Phone: [phone] Fax: [phone]  [place], [place] [facility] [address] [place] [address] [place] [address] Phone: [phone] Fax: [place], [place] - [address] [place] [address] Phone: [phone] Fax: [phone]     Social Determinants of Health (SDOH) Interventions    Readmission Risk Interventions No flowsheet data found.

## 2019-05-08 NOTE — Discharge Summary (Signed)
Physician Discharge Summary  [name]. [id number] DOB: 11/20/1930 DOA: 05/05/2019  PCP: [name], MD  Admit date: 05/05/2019 Discharge date: 05/08/2019  Admitted From: Home Disposition: Home  Recommendations for Outpatient Follow-up:  1. Follow up with PCP in 1-2 weeks 2. Please obtain CBC/BMP/Mag at follow up 3. Please follow up on the following pending results: None  Home Health: None Equipment/Devices: None  Discharge Condition: Stable CODE STATUS: Full code  Hospital Course: Briefly, 85 y.o.male with history of HTN, CKD, iron deficiency anemia, CAD, paroxysmal A. fib on Coumadin and gout presenting with diarrhea and hypotension after he was started on colchicine about 1.5 ago for his gout.  He was admitted for AKI on CKD-3 in the setting of diarrhea as above and hypotension.  Started on IV fluid and Imodium. Renal function improved, and continue to improve off IV fluid.  Hypotension resolved.  Diarrhea improved as well.  Patient was evaluated by PT/OT prior to discharge and no need was identified.  See individual problem list below for more.  Discharge Diagnoses:  Diarrhea likely due to colchicine: Abdominal exam is benign.  Had mild leukocytosis but no fever.  Doubt infectious process.  Diarrhea improving.  Only had 2 bowel movements since yesterday afternoon. -Colchicine discontinued. -Imodium as needed. -Recommended good hydration.  Hypotension: Resolved.  Normotensive -Temporarily discontinued his benazepril/HCTZ.  AKI on CKD-3: Creatinine peaked at 2.7 down to 1.7.  Baseline 1.5.  Renal US consistent with CKD without acute finding.  Renal function continues to improve off IV fluid. -IV fluid hydration -Discontinued benazepril/HCTZ. -Recheck BMP and magnesium at follow-up.  Hypokalemia/hypomagnesemia: Likely due to diarrhea.  Replenished and resolved -Recheck at follow-up.  Gout flare: seems to have resolved.  He is not in pain now.  -Colchicine discontinued due to diarrhea. -Started on low-dose allopurinol.  Paroxysmal A. Fib/supratherapeutic INR: Rate controlled on Coreg.  On warfarin for anticoagulation.  INR supratherapeutic but improved to 4.4. -Recommended holding his warfarin for 2 days -Recommended INR checkup in 2 days.  Patient to call and make appointment as soon as possible.  Hypertension: Hypotensive on admission.  Now normotensive. -Continue home Coreg and doxazosin -Discontinued benazepril and HCTZ.  Normocytic anemia: Likely combination of anemia of chronic disease and iron deficiency.  Denies bleeding. -H&H stable.  History of CAD: Stable.  No anginal symptoms. -Continue home Coreg and statin  BPH: No urinary symptoms. -Continue home meds  Discharge Instructions  Discharge Instructions    Call MD for:   Complete by: As directed    Worsening diarrhea   Call MD for:  extreme fatigue   Complete by: As directed    Call MD for:  persistant dizziness or light-headedness   Complete by: As directed    Call MD for:  persistant nausea and vomiting   Complete by: As directed    Call MD for:  temperature >100.4   Complete by: As directed    Diet - low sodium heart healthy   Complete by: As directed    Discharge instructions   Complete by: As directed    It has been a pleasure taking care of you! You were admitted with diarrhea and acute kidney injury due to colchicine for your gout.  We stopped the colchicine.  We treated you with IV fluids and medications for diarrhea.  With that, your symptoms improved to the point we think it is safe to let you go home and follow-up with your primary care doctor.  We gave  you a prescription for another medication for gout.  We also stopped one of your blood pressure medication (benazepril/HCTZ) until your kidney recovers fully.  Your doctor may resume that medication after hospital follow-up.  You INR (Coumadin level) is a still high.  We recommend  stopping warfarin for the next 2 days.  Please go to your Coumadin clinic and have your INR checked in 2 days before you resume your warfarin.  Please review your new medication list and the directions before you take your medications.  Please call your primary care office as soon as possible to schedule hospital follow-up visit in 1 to 2 weeks.  Take care,   Increase activity slowly   Complete by: As directed      Allergies as of 05/08/2019      Reactions   Tramadol Itching      Medication List    STOP taking these medications   benazepril-hydrochlorthiazide 20-12.5 MG tablet Commonly known as: LOTENSIN HCT   colchicine 0.6 MG tablet     TAKE these medications   acetaminophen 500 MG tablet Commonly known as: TYLENOL Take 500 mg by mouth every 6 (six) hours as needed (for pain).   albuterol 108 (90 Base) MCG/ACT inhaler Commonly known as: VENTOLIN HFA Inhale 2 puffs into the lungs every 6 (six) hours as needed for wheezing or shortness of breath.   allopurinol 100 MG tablet Commonly known as: Zyloprim Take 1 tablet (100 mg total) by mouth daily.   carvedilol 3.125 MG tablet Commonly known as: COREG TAKE 1 TABLET BY MOUTH TWO  TIMES DAILY What changed: when to take this   doxazosin 4 MG tablet Commonly known as: CARDURA TAKE 1 TABLET BY MOUTH AT  BEDTIME   ferrous sulfate 325 (65 FE) MG tablet Commonly known as: FerrouSul Take 1 tablet (325 mg total) by mouth 3 (three) times daily with meals.   finasteride 5 MG tablet Commonly known as: PROSCAR Take 1 tablet (5 mg total) by mouth daily.   latanoprost 0.005 % ophthalmic solution Commonly known as: XALATAN Place 1 drop into both eyes at bedtime.   loperamide 2 MG capsule Commonly known as: IMODIUM Take 1 capsule (2 mg total) by mouth as needed for diarrhea or loose stools.   methocarbamol 500 MG tablet Commonly known as: Robaxin Take 1 tablet (500 mg total) by mouth every 6 (six) hours as needed for muscle  spasms.   potassium chloride SA 20 MEQ tablet Commonly known as: K-DUR TAKE ONE-HALF TABLET BY  MOUTH DAILY   PRESERVISION AREDS 2 PO Take 1 capsule by mouth 2 (two) times daily.   simvastatin 20 MG tablet Commonly known as: ZOCOR TAKE 1 TABLET BY MOUTH AT  BEDTIME   Vitamin D3 25 MCG (1000 UT) Caps Take 1,000 Units by mouth daily.   warfarin 5 MG tablet Commonly known as: COUMADIN Take as directed. If you are unsure how to take this medication, talk to your nurse or doctor. Original instructions: TAKE 1/2 TABLET BY MOUTH  DAILY OR AS DIRECTED BY  COUMADIN CLINIC What changed:   how much to take  how to take this  when to take this      Follow-up Information    [name], MD Follow up.   Specialty: Family Medicine Contact information: [place] [address] [phone]           Consultations:  None  Procedures/Studies:  2D Echo: None  US Renal  Result Date: 05/06/2019  CLINICAL DATA:  Acute renal failure EXAM: RENAL / URINARY TRACT ULTRASOUND COMPLETE COMPARISON:  None. FINDINGS: Right Kidney: Limited visualization due to shadowing colonic gas. Renal measurements: 10 x 4 x 6 cm = volume: 140 mL. Cortical thinning to 11 mm. Limited assessment of echogenicity. No mass or hydronephrosis visualized. Left Kidney: Renal measurements: 10 x 4 x 5 cm = volume: 110 mL. Cortical thinning to 11 mm. Mildly increased echogenicity. No solid mass or hydronephrosis visualized. Bladder: Appears normal for degree of bladder distention. IMPRESSION: 1. Medical renal disease with bilateral cortical thinning. 2. No hydronephrosis. Electronically Signed   By: Monte Fantasia M.D.   On: 05/06/2019 05:20   Dg Toe Great Left  Result Date: 04/18/2019 CLINICAL DATA:  LEFT great toe and first MTP joint pain, redness, and swelling for 3 days, no known injury, history diabetes mellitus, gout EXAM: LEFT GREAT TOE COMPARISON:  08/23/2018 FINDINGS: Osseous  demineralization. Joint space narrowing at first MTP joint. Subchondral cysts at first metatarsal head. Suspected erosion at the lateral margin of the first metatarsal head. Additional small erosion at the base of the distal phalanx of the great toe. No acute fracture or dislocation. IMPRESSION: Erosions at the LEFT first MTP joint and at the IP joint great toe which could reflect gout. No acute fracture or dislocation identified. Electronically Signed   By: Lavonia Dana M.D.   On: 04/18/2019 17:28     Subjective: No major events overnight of this morning.  Had only 2 bowel movements since yesterday evening.  Stools more formed.  Denies nausea, vomiting or abdominal pain.  Denies chest pain or dyspnea.  Feels well to go home.  Expressed appreciation about his care.   Discharge Exam: Vitals:   05/08/19 0833 05/08/19 1142  BP: 115/64 (!) 103/58  Pulse: 80 72  Resp: 18 19  Temp: 98.4 F (36.9 C) 98.8 F (37.1 C)  SpO2: 98% 99%    GENERAL: No acute distress.  Appears well.  HEENT: MMM.  Vision and hearing grossly intact.  NECK: Supple.  No JVD.  LUNGS:  No IWOB. Good air movement bilaterally. HEART: IR.  Normal rate. Heart sounds normal.  ABD: Bowel sounds present. Soft. Non tender.  MSK/EXT:  Moves all extremities. No apparent deformity. No edema bilaterally. SKIN: no apparent skin lesion or wound NEURO: Awake, alert and oriented appropriately.  No gross deficit.  PSYCH: Calm. Normal affect.     The results of significant diagnostics from this hospitalization (including imaging, microbiology, ancillary and laboratory) are listed below for reference.     Microbiology: Recent Results (from the past 240 hour(s))  SARS CORONAVIRUS 2 Nasal Swab Aptima Multi Swab     Status: None   Collection Time: 05/06/19  1:17 AM   Specimen: Aptima Multi Swab; Nasal Swab  Result Value Ref Range Status   SARS Coronavirus 2 NEGATIVE NEGATIVE Final    Comment: (NOTE) SARS-CoV-2 target nucleic acids  are NOT DETECTED. The SARS-CoV-2 RNA is generally detectable in upper and lower respiratory specimens during the acute phase of infection. Negative results do not preclude SARS-CoV-2 infection, do not rule out co-infections with other pathogens, and should not be used as the sole basis for treatment or other patient management decisions. Negative results must be combined with clinical observations, patient history, and epidemiological information. The expected result is Negative. Fact Sheet for Patients: SugarRoll.be Fact Sheet for Healthcare Providers: https://www.woods-mathews.com/ This test is not yet approved or cleared by the [place] FDA and  has  been authorized for detection and/or diagnosis of SARS-CoV-2 by FDA under an Emergency Use Authorization (EUA). This EUA will remain  in effect (meaning this test can be used) for the duration of the COVID-19 declaration under Section 56 4(b)(1) of the Act, 21 U.S.C. section 360bbb-3(b)(1), unless the authorization is terminated or revoked sooner. Performed at [facility], [place] [address], [place], [place] [address]      Labs: BNP (last 3 results) Recent Labs    09/19/18 0421  BNP [id number]   Basic Metabolic Panel: Recent Labs  Lab 05/05/19 2145 05/06/19 0520 05/07/19 0436 05/08/19 0357  NA 138 142 140 140  K 3.7 4.0 3.1* 3.9  CL 105 108 113* 110  CO2 21* 21* 21* 22  GLUCOSE 114* 98 83 95  BUN 31* 33* 32* 27*  CREATININE 2.59* 2.60* 1.96* 1.70*  CALCIUM 9.0 8.2* 7.8* 8.2*  MG  --   --  1.4* 1.6*  PHOS  --   --  2.9 2.2*   Liver Function Tests: Recent Labs  Lab 05/05/19 2145 05/06/19 0520  AST 19 22  ALT 11 6  ALKPHOS 61 51  BILITOT 1.3* 1.5*  PROT 5.9* 4.9*  ALBUMIN 3.2* 2.8*   No results for input(s): LIPASE, AMYLASE in the last 168 hours. No results for input(s): AMMONIA in the last 168 hours. CBC: Recent Labs  Lab 05/05/19 2145 05/06/19 0520 05/07/19  0436 05/08/19 0357  WBC 13.1* 11.4* 7.2 9.8  NEUTROABS 10.8*  --   --   --   HGB 12.4* 11.2* 10.5* 10.9*  HCT 38.4* 35.6* 32.7* 33.8*  MCV 93.4 96.2 93.7 94.2  PLT 151 149* 127* 140*   Cardiac Enzymes: No results for input(s): CKTOTAL, CKMB, CKMBINDEX, TROPONINI in the last 168 hours. BNP: Invalid input(s): POCBNP CBG: No results for input(s): GLUCAP in the last 168 hours. D-Dimer No results for input(s): DDIMER in the last 72 hours. Hgb A1c No results for input(s): HGBA1C in the last 72 hours. Lipid Profile No results for input(s): CHOL, HDL, LDLCALC, TRIG, CHOLHDL, LDLDIRECT in the last 72 hours. Thyroid function studies No results for input(s): TSH, T4TOTAL, T3FREE, THYROIDAB in the last 72 hours.  Invalid input(s): FREET3 Anemia work up No results for input(s): VITAMINB12, FOLATE, FERRITIN, TIBC, IRON, RETICCTPCT in the last 72 hours. Urinalysis    Component Value Date/Time   COLORURINE YELLOW 09/17/2018 2215   APPEARANCEUR CLEAR 09/17/2018 2215   LABSPEC 1.024 09/17/2018 2215   PHURINE 5.0 09/17/2018 2215   GLUCOSEU NEGATIVE 09/17/2018 2215   HGBUR NEGATIVE 09/17/2018 2215   BILIRUBINUR NEGATIVE 09/17/2018 2215   KETONESUR 5 (A) 09/17/2018 2215   PROTEINUR NEGATIVE 09/17/2018 2215   NITRITE NEGATIVE 09/17/2018 2215   LEUKOCYTESUR NEGATIVE 09/17/2018 2215   Sepsis Labs Invalid input(s): PROCALCITONIN,  WBC,  LACTICIDVEN   Time coordinating discharge: 35 minutes  SIGNED:  Mercy Riding, MD  [facility] 05/08/2019, 5:00 PM  If 7PM-7AM, please contact night-coverage [email or web address] Password [email or web address]

## 2019-05-08 NOTE — Progress Notes (Signed)
[place] for warfarin Indication: atrial fibrillation  Allergies  Allergen Reactions  . Tramadol Itching    Patient Measurements: Weight: 182 lb (82.6 kg)(scale a)  Vital Signs: Temp: 97.9 F (36.6 C) (08/26 0637) Temp Source: Oral (08/26 0637) BP: 93/76 (08/26 [id number]) Pulse Rate: 93 (08/26 0637)  Labs: Recent Labs    05/06/19 0150 05/06/19 0520 05/07/19 0436 05/08/19 0357  HGB  --  11.2* 10.5* 10.9*  HCT  --  35.6* 32.7* 33.8*  PLT  --  149* 127* 140*  LABPROT 40.3*  --  45.1* 41.1*  INR 4.3*  --  4.9* 4.4*  CREATININE  --  2.60* 1.96* 1.70*    CrCl cannot be calculated (Unknown ideal weight.).  Assessment: 85 y/o male on warfarin PTA for Afib admitted with AKI in setting of hypotension and diarrhea from colchicine use for gout. INR on admit was supratherapeutic at 4.3.   INR remains supratherapeutic at 4.4 but is now trending downward; would like to see INR trend down a bit more before resuming warfarin. No bleeding noted, Hgb 10.9 and likely dilutional, platelets up to 140.  PTA regimen: 2.5 mg daily, last 8/22  Goal of Therapy:  INR 2-3 Monitor platelets by anticoagulation protocol: Yes   Plan:  No warfarin tonight Daily INR Monitor for s/sx of bleeding   Thank you for involving pharmacy in this patient's care.  Renold Genta, PharmD, BCPS Clinical Pharmacist Clinical phone for 05/08/2019 until 3p is [phone] 05/08/2019 8:10 AM  **Pharmacist phone directory can be found on [email or web address] listed under [place]**

## 2019-05-13 ENCOUNTER — Other Ambulatory Visit: Payer: Self-pay

## 2019-05-13 ENCOUNTER — Encounter: Payer: Self-pay | Admitting: Family Medicine

## 2019-05-13 ENCOUNTER — Ambulatory Visit (INDEPENDENT_AMBULATORY_CARE_PROVIDER_SITE_OTHER): Payer: Medicare Other | Admitting: Family Medicine

## 2019-05-13 ENCOUNTER — Inpatient Hospital Stay: Payer: Medicare Other | Admitting: Family Medicine

## 2019-05-13 VITALS — BP 129/78 | HR 86 | Wt 182.0 lb

## 2019-05-13 DIAGNOSIS — R197 Diarrhea, unspecified: Secondary | ICD-10-CM

## 2019-05-13 DIAGNOSIS — Z7901 Long term (current) use of anticoagulants: Secondary | ICD-10-CM

## 2019-05-13 DIAGNOSIS — M109 Gout, unspecified: Secondary | ICD-10-CM

## 2019-05-13 DIAGNOSIS — N179 Acute kidney failure, unspecified: Secondary | ICD-10-CM | POA: Diagnosis not present

## 2019-05-13 NOTE — Progress Notes (Signed)
   Subjective:    Patient ID: [name]., male    DOB: August 26, 1931, 85 y.o.   MRN: [id number]  HPI Documentation for virtual telephone encounter.  Documentation for virtual audio and video telecommunications through [place] encounter: The patient was located at home. The provider was located in the office. The patient did consent to this visit and is aware of possible charges through their insurance for this visit. The other persons participating in this telemedicine service was his daughter. Time spent on call was 5 minutes and in review of previous records >15 minutes total. This virtual service is not related to other E/M service within previous 7 days. He was admitted on August 23 and sent home on the 26th.  The discharge summary was reviewed and did show evidence of diarrhea probably secondary to use of colchicine.  He also had an acute kidney issue probably secondary to dehydration.  He responded nicely to IV fluid.  He was sent home on allopurinol 100 mg.  Is also told to take Imodium but did not give instructions on how much or how often.  His PT/INR was slightly above 4 and he held his Coumadin for 2 days.  He is now back on 2-1/2 mg of Coumadin. He continues have difficulty with diarrhea and great toe pain.  He was sent home on 100 mg of allopurinol.    Review of Systems     Objective:   Physical Exam  Alert and in no distress otherwise not examined Medical record was reviewed including H&P, discharge summary and lab data.     Assessment & Plan:  Gouty arthritis  Diarrhea, unspecified type - Plan: CBC with Differential/Platelet, Comprehensive metabolic panel, Magnesium  Acute kidney injury ([place]) - Plan: CBC with Differential/Platelet, Comprehensive metabolic panel, Magnesium  Long term (current) use of anticoagulants He will continue on allopurinol.  Cautioned that his arthritis symptoms might get worse.  Recommend Tylenol for that.  Might need to possibly use  codeine but will need to be cautious with that.  He is also had difficulty with tramadol causing itching. Recommend taking up to 2 pills 4 times per day for the diarrhea and use a probiotic.  If this continues, we will need to evaluate for other causes of the diarrhea.  He has no recent history of use of antibiotic.

## 2019-05-14 ENCOUNTER — Encounter: Payer: Medicare Other | Admitting: *Deleted

## 2019-05-14 ENCOUNTER — Other Ambulatory Visit: Payer: Medicare Other

## 2019-05-14 DIAGNOSIS — N179 Acute kidney failure, unspecified: Secondary | ICD-10-CM

## 2019-05-14 DIAGNOSIS — R197 Diarrhea, unspecified: Secondary | ICD-10-CM | POA: Diagnosis not present

## 2019-05-15 LAB — CBC WITH DIFFERENTIAL/PLATELET
Basophils Absolute: 0 10*3/uL (ref 0.0–0.2)
Basos: 0 %
EOS (ABSOLUTE): 0.4 10*3/uL (ref 0.0–0.4)
Eos: 4 %
Hematocrit: 35.8 % — ABNORMAL LOW (ref 37.5–51.0)
Hemoglobin: 11.2 g/dL — ABNORMAL LOW (ref 13.0–17.7)
Immature Grans (Abs): 0 10*3/uL (ref 0.0–0.1)
Immature Granulocytes: 0 %
Lymphocytes Absolute: 1.1 10*3/uL (ref 0.7–3.1)
Lymphs: 11 %
MCH: 29.4 pg (ref 26.6–33.0)
MCHC: 31.3 g/dL — ABNORMAL LOW (ref 31.5–35.7)
MCV: 94 fL (ref 79–97)
Monocytes Absolute: 0.9 10*3/uL (ref 0.1–0.9)
Monocytes: 9 %
Neutrophils Absolute: 7.3 10*3/uL — ABNORMAL HIGH (ref 1.4–7.0)
Neutrophils: 76 %
Platelets: 175 10*3/uL (ref 150–450)
RBC: 3.81 x10E6/uL — ABNORMAL LOW (ref 4.14–5.80)
RDW: 13.4 % (ref 11.6–15.4)
WBC: 9.7 10*3/uL (ref 3.4–10.8)

## 2019-05-15 LAB — COMPREHENSIVE METABOLIC PANEL
ALT: 19 IU/L (ref 0–44)
AST: 26 IU/L (ref 0–40)
Albumin/Globulin Ratio: 1.6 (ref 1.2–2.2)
Albumin: 3.1 g/dL — ABNORMAL LOW (ref 3.6–4.6)
Alkaline Phosphatase: 58 IU/L (ref 39–117)
BUN/Creatinine Ratio: 16 (ref 10–24)
BUN: 24 mg/dL (ref 8–27)
Bilirubin Total: 0.5 mg/dL (ref 0.0–1.2)
CO2: 22 mmol/L (ref 20–29)
Calcium: 8.6 mg/dL (ref 8.6–10.2)
Chloride: 108 mmol/L — ABNORMAL HIGH (ref 96–106)
Creatinine, Ser: 1.5 mg/dL — ABNORMAL HIGH (ref 0.76–1.27)
GFR calc Af Amer: 48 mL/min/{1.73_m2} — ABNORMAL LOW (ref >59)
GFR calc non Af Amer: 42 mL/min/{1.73_m2} — ABNORMAL LOW (ref >59)
Globulin, Total: 2 g/dL (ref 1.5–4.5)
Glucose: 87 mg/dL (ref 65–99)
Potassium: 4.2 mmol/L (ref 3.5–5.2)
Sodium: 142 mmol/L (ref 134–144)
Total Protein: 5.1 g/dL — ABNORMAL LOW (ref 6.0–8.5)

## 2019-05-15 LAB — MAGNESIUM: Magnesium: 1.6 mg/dL (ref 1.6–2.3)

## 2019-05-21 ENCOUNTER — Telehealth: Payer: Self-pay | Admitting: Family Medicine

## 2019-05-21 NOTE — Telephone Encounter (Signed)
He complains mainly of swelling and I explained that that should go away slowly over the next month.  He was okay with that.  Nothing else that you need to do.

## 2019-05-21 NOTE — Telephone Encounter (Signed)
Pt called and wants to speak with you about his gout. If you need to schedule pt for Virtual let me know

## 2019-05-24 ENCOUNTER — Ambulatory Visit (INDEPENDENT_AMBULATORY_CARE_PROVIDER_SITE_OTHER): Payer: Medicare Other | Admitting: Pharmacist

## 2019-05-24 ENCOUNTER — Other Ambulatory Visit: Payer: Self-pay

## 2019-05-24 DIAGNOSIS — Z7901 Long term (current) use of anticoagulants: Secondary | ICD-10-CM | POA: Diagnosis not present

## 2019-05-24 DIAGNOSIS — I4821 Permanent atrial fibrillation: Secondary | ICD-10-CM

## 2019-05-24 LAB — PROTIME-INR
INR: 7.4 (ref 0.8–1.2)
Prothrombin Time: 72.3 s — ABNORMAL HIGH (ref 9.1–12.0)

## 2019-05-24 LAB — POCT INR: INR: 7.6 — AB (ref 2.0–3.0)

## 2019-05-25 ENCOUNTER — Other Ambulatory Visit: Payer: Self-pay | Admitting: Family Medicine

## 2019-05-25 DIAGNOSIS — M109 Gout, unspecified: Secondary | ICD-10-CM

## 2019-05-27 ENCOUNTER — Other Ambulatory Visit: Payer: Self-pay

## 2019-05-27 ENCOUNTER — Ambulatory Visit (INDEPENDENT_AMBULATORY_CARE_PROVIDER_SITE_OTHER): Payer: Medicare Other | Admitting: Pharmacist

## 2019-05-27 DIAGNOSIS — I4821 Permanent atrial fibrillation: Secondary | ICD-10-CM

## 2019-05-27 DIAGNOSIS — Z7901 Long term (current) use of anticoagulants: Secondary | ICD-10-CM | POA: Diagnosis not present

## 2019-05-27 LAB — POCT INR: INR: 4.2 — AB (ref 2.0–3.0)

## 2019-05-27 MED ORDER — WARFARIN SODIUM 2.5 MG PO TABS: ORAL_TABLET | ORAL | 0 refills | Status: DC

## 2019-05-28 ENCOUNTER — Ambulatory Visit (INDEPENDENT_AMBULATORY_CARE_PROVIDER_SITE_OTHER): Payer: Medicare Other | Admitting: Family Medicine

## 2019-05-28 ENCOUNTER — Encounter: Payer: Self-pay | Admitting: Family Medicine

## 2019-05-28 VITALS — BP 120/82 | HR 81 | Temp 98.9°F | Wt 187.2 lb

## 2019-05-28 DIAGNOSIS — Z23 Encounter for immunization: Secondary | ICD-10-CM

## 2019-05-28 DIAGNOSIS — R609 Edema, unspecified: Secondary | ICD-10-CM

## 2019-05-28 DIAGNOSIS — M109 Gout, unspecified: Secondary | ICD-10-CM | POA: Diagnosis not present

## 2019-05-28 NOTE — Progress Notes (Signed)
   Subjective:    Patient ID: [name]., male    DOB: 05/04/1931, 85 y.o.   MRN: [id number]  HPI He is here for consult concerning lower extremity swelling.  He notes that this occurs usually later on in the day and then when he wakes up in the morning is much less bothersome.  He has had no chest pain, shortness of breath, PND or DOE. Also he states that his diarrhea is now under good control.  He is not using Imodium and having 2 or 3 formed BMs per day.  There is a question about his appetite however review of the record indicates his weight is actually up. Also has a history of gouty arthritis but no problems at the present time  Review of Systems     Objective:   Physical Exam Exam of his lower extremities does show 2-3+ pitting edema.  Negative Homans sign.  Skin appears normal. No tenderness palpation over the MTPs.      Assessment & Plan:  Dependent edema  Gouty arthritis  Need for influenza vaccination - Plan: Flu Vaccine QUAD High Dose(Fluad) Start wearing support hose and make sure that you use shoes that have good arch support.  While sitting keep your feet elevated as much as possible Start wearing support hose and make sure that you use shoes that have good arch support.  While sitting keep your feet elevated as much as possible

## 2019-05-28 NOTE — Patient Instructions (Addendum)
Start wearing support hose and make sure that you use shoes that have good arch support.  While sitting keep your feet elevated as much as possible

## 2019-06-03 ENCOUNTER — Ambulatory Visit (INDEPENDENT_AMBULATORY_CARE_PROVIDER_SITE_OTHER): Payer: Medicare Other | Admitting: Pharmacist Clinician (PhC)/ Clinical Pharmacy Specialist

## 2019-06-03 ENCOUNTER — Other Ambulatory Visit: Payer: Self-pay

## 2019-06-03 DIAGNOSIS — I4821 Permanent atrial fibrillation: Secondary | ICD-10-CM | POA: Diagnosis not present

## 2019-06-03 DIAGNOSIS — Z7901 Long term (current) use of anticoagulants: Secondary | ICD-10-CM | POA: Diagnosis not present

## 2019-06-03 LAB — POCT INR: INR: 2.1 (ref 2.0–3.0)

## 2019-06-04 ENCOUNTER — Ambulatory Visit (INDEPENDENT_AMBULATORY_CARE_PROVIDER_SITE_OTHER): Payer: Medicare Other | Admitting: *Deleted

## 2019-06-04 DIAGNOSIS — I4821 Permanent atrial fibrillation: Secondary | ICD-10-CM

## 2019-06-04 LAB — CUP PACEART REMOTE DEVICE CHECK
Battery Remaining Longevity: 101 mo
Battery Remaining Percentage: 81 %
Battery Voltage: 2.93 V
Brady Statistic RV Percent Paced: 17 %
Date Time Interrogation Session: 20200922152722
Implantable Lead Implant Date: 20040429
Implantable Lead Location: 753860
Implantable Pulse Generator Implant Date: 20110325
Lead Channel Impedance Value: 400 Ohm
Lead Channel Pacing Threshold Amplitude: 1.25 V
Lead Channel Pacing Threshold Pulse Width: 0.9 ms
Lead Channel Sensing Intrinsic Amplitude: 2.4 mV
Lead Channel Setting Pacing Amplitude: 2.5 V
Lead Channel Setting Pacing Pulse Width: 0.9 ms
Lead Channel Setting Sensing Sensitivity: 0.7 mV
Pulse Gen Model: 1110
Pulse Gen Serial Number: 2313421

## 2019-06-11 NOTE — Progress Notes (Signed)
Remote pacemaker transmission.   

## 2019-06-13 ENCOUNTER — Ambulatory Visit (INDEPENDENT_AMBULATORY_CARE_PROVIDER_SITE_OTHER): Payer: Medicare Other | Admitting: Pharmacist Clinician (PhC)/ Clinical Pharmacy Specialist

## 2019-06-13 ENCOUNTER — Other Ambulatory Visit: Payer: Self-pay

## 2019-06-13 DIAGNOSIS — Z7901 Long term (current) use of anticoagulants: Secondary | ICD-10-CM | POA: Diagnosis not present

## 2019-06-13 LAB — POCT INR: INR: 1.7 — AB (ref 2.0–3.0)

## 2019-06-27 ENCOUNTER — Ambulatory Visit (INDEPENDENT_AMBULATORY_CARE_PROVIDER_SITE_OTHER): Payer: Medicare Other | Admitting: Pharmacist Clinician (PhC)/ Clinical Pharmacy Specialist

## 2019-06-27 ENCOUNTER — Other Ambulatory Visit: Payer: Self-pay

## 2019-06-27 DIAGNOSIS — Z7901 Long term (current) use of anticoagulants: Secondary | ICD-10-CM

## 2019-06-27 LAB — POCT INR: INR: 1.8 — AB (ref 2.0–3.0)

## 2019-07-11 DIAGNOSIS — Z85828 Personal history of other malignant neoplasm of skin: Secondary | ICD-10-CM | POA: Diagnosis not present

## 2019-07-11 DIAGNOSIS — L821 Other seborrheic keratosis: Secondary | ICD-10-CM | POA: Diagnosis not present

## 2019-07-11 DIAGNOSIS — D3611 Benign neoplasm of peripheral nerves and autonomic nervous system of face, head, and neck: Secondary | ICD-10-CM | POA: Diagnosis not present

## 2019-07-11 DIAGNOSIS — D1801 Hemangioma of skin and subcutaneous tissue: Secondary | ICD-10-CM | POA: Diagnosis not present

## 2019-07-11 DIAGNOSIS — D3612 Benign neoplasm of peripheral nerves and autonomic nervous system, upper limb, including shoulder: Secondary | ICD-10-CM | POA: Diagnosis not present

## 2019-07-12 ENCOUNTER — Ambulatory Visit (INDEPENDENT_AMBULATORY_CARE_PROVIDER_SITE_OTHER): Payer: Medicare Other | Admitting: Pharmacist Clinician (PhC)/ Clinical Pharmacy Specialist

## 2019-07-12 ENCOUNTER — Other Ambulatory Visit: Payer: Self-pay

## 2019-07-12 DIAGNOSIS — Z7901 Long term (current) use of anticoagulants: Secondary | ICD-10-CM | POA: Diagnosis not present

## 2019-07-12 DIAGNOSIS — I4821 Permanent atrial fibrillation: Secondary | ICD-10-CM

## 2019-07-12 LAB — POCT INR: INR: 1.7 — AB (ref 2.0–3.0)

## 2019-07-19 ENCOUNTER — Other Ambulatory Visit: Payer: Self-pay | Admitting: Family Medicine

## 2019-07-31 ENCOUNTER — Other Ambulatory Visit: Payer: Self-pay

## 2019-07-31 ENCOUNTER — Ambulatory Visit (INDEPENDENT_AMBULATORY_CARE_PROVIDER_SITE_OTHER): Payer: Medicare Other | Admitting: Pharmacist

## 2019-07-31 DIAGNOSIS — Z7901 Long term (current) use of anticoagulants: Secondary | ICD-10-CM

## 2019-07-31 LAB — POCT INR: INR: 3 (ref 2.0–3.0)

## 2019-08-05 ENCOUNTER — Telehealth: Payer: Self-pay | Admitting: Family Medicine

## 2019-08-05 MED ORDER — ALLOPURINOL 100 MG PO TABS: 100.0000 mg | ORAL_TABLET | Freq: Every day | ORAL | 0 refills | Status: DC

## 2019-08-05 NOTE — Telephone Encounter (Signed)
I want to see him as well as draw blood

## 2019-08-05 NOTE — Telephone Encounter (Signed)
appt was change and daughter was advised. [place]

## 2019-08-05 NOTE — Telephone Encounter (Signed)
Pt called and is wanting to know if he needs to continue taking the allopurionol states he was giving it at the hospital if so please send him a refill to the [id number] - [place], [address] and please call him to let him know if was sent in at  [phone]

## 2019-08-05 NOTE — Telephone Encounter (Signed)
See note below

## 2019-08-05 NOTE — Telephone Encounter (Signed)
Appt made for wed please put in labs. [place]

## 2019-08-05 NOTE — Telephone Encounter (Signed)
Schedule him for follow-up blood work.

## 2019-08-07 ENCOUNTER — Other Ambulatory Visit: Payer: Self-pay

## 2019-08-07 ENCOUNTER — Encounter: Payer: Self-pay | Admitting: Family Medicine

## 2019-08-07 ENCOUNTER — Ambulatory Visit (INDEPENDENT_AMBULATORY_CARE_PROVIDER_SITE_OTHER): Payer: Medicare Other | Admitting: Family Medicine

## 2019-08-07 VITALS — BP 124/72 | HR 76 | Temp 96.8°F | Wt 182.6 lb

## 2019-08-07 DIAGNOSIS — N1831 Chronic kidney disease, stage 3a: Secondary | ICD-10-CM | POA: Diagnosis not present

## 2019-08-07 DIAGNOSIS — Z79899 Other long term (current) drug therapy: Secondary | ICD-10-CM | POA: Diagnosis not present

## 2019-08-07 DIAGNOSIS — M109 Gout, unspecified: Secondary | ICD-10-CM | POA: Diagnosis not present

## 2019-08-07 NOTE — Progress Notes (Signed)
   Subjective:    Patient ID: [name]., male    DOB: 06/06/1931, 86 y.o.   MRN: [id number]  HPI He is here for recheck on his gout.  He was given colchicine however it did cause difficulty with diarrhea.  He was switched to allopurinol 100 mg and seems to be tolerating it quite nicely.  Review of record also indicates some slight abnormalities in  renal function.  He has not had any more gouty related breakthroughs.   Review of Systems     Objective:   Physical Exam  Alert and in no distress.  Exam of the left great toe shows no swelling tenderness or erythema.      Assessment & Plan:  Gout involving toe, unspecified cause, unspecified chronicity, unspecified laterality - Plan: Uric Acid  Encounter for long-term (current) use of medications - Plan: Comprehensive metabolic panel  Stage 3a chronic kidney disease - Plan: Comprehensive metabolic panel Follow-up pending blood work.  I did talk to him about his atrial fib and use of Coumadin.  He will check on the cost of Eliquis and Xarelto to have an idea of whether he would want to switch to that from his present Coumadin dosing.

## 2019-08-08 LAB — COMPREHENSIVE METABOLIC PANEL
ALT: 14 IU/L (ref 0–44)
AST: 19 IU/L (ref 0–40)
Albumin/Globulin Ratio: 2 (ref 1.2–2.2)
Albumin: 4 g/dL (ref 3.6–4.6)
Alkaline Phosphatase: 94 IU/L (ref 39–117)
BUN/Creatinine Ratio: 17 (ref 10–24)
BUN: 21 mg/dL (ref 8–27)
Bilirubin Total: 0.8 mg/dL (ref 0.0–1.2)
CO2: 23 mmol/L (ref 20–29)
Calcium: 9.5 mg/dL (ref 8.6–10.2)
Chloride: 106 mmol/L (ref 96–106)
Creatinine, Ser: 1.21 mg/dL (ref 0.76–1.27)
GFR calc Af Amer: 62 mL/min/{1.73_m2} (ref >59)
GFR calc non Af Amer: 54 mL/min/{1.73_m2} — ABNORMAL LOW (ref >59)
Globulin, Total: 2 g/dL (ref 1.5–4.5)
Glucose: 113 mg/dL — ABNORMAL HIGH (ref 65–99)
Potassium: 4.8 mmol/L (ref 3.5–5.2)
Sodium: 143 mmol/L (ref 134–144)
Total Protein: 6 g/dL (ref 6.0–8.5)

## 2019-08-08 LAB — URIC ACID: Uric Acid: 4.6 mg/dL (ref 3.7–8.6)

## 2019-08-08 MED ORDER — ALLOPURINOL 100 MG PO TABS: 100.0000 mg | ORAL_TABLET | Freq: Every day | ORAL | 3 refills | Status: DC

## 2019-08-08 NOTE — Addendum Note (Signed)
Addended by: [name] on: 08/08/2019 12:51 PM   Modules accepted: Orders

## 2019-08-12 ENCOUNTER — Telehealth: Payer: Self-pay

## 2019-08-12 NOTE — Telephone Encounter (Signed)
Pt was advised [facility] 

## 2019-08-12 NOTE — Telephone Encounter (Signed)
Pt wanted to know if he should continue to take allopurinol and is so he will need a new script. Please advise. [name]

## 2019-08-12 NOTE — Telephone Encounter (Signed)
His uric acid level is in a good range. Let him know that I called the allopurinol in already.

## 2019-08-21 DIAGNOSIS — H52203 Unspecified astigmatism, bilateral: Secondary | ICD-10-CM | POA: Diagnosis not present

## 2019-08-21 DIAGNOSIS — H401132 Primary open-angle glaucoma, bilateral, moderate stage: Secondary | ICD-10-CM | POA: Diagnosis not present

## 2019-08-21 DIAGNOSIS — Z961 Presence of intraocular lens: Secondary | ICD-10-CM | POA: Diagnosis not present

## 2019-08-28 DIAGNOSIS — Z96653 Presence of artificial knee joint, bilateral: Secondary | ICD-10-CM | POA: Diagnosis not present

## 2019-08-28 DIAGNOSIS — Z471 Aftercare following joint replacement surgery: Secondary | ICD-10-CM | POA: Diagnosis not present

## 2019-08-29 ENCOUNTER — Other Ambulatory Visit: Payer: Self-pay

## 2019-08-29 ENCOUNTER — Ambulatory Visit (INDEPENDENT_AMBULATORY_CARE_PROVIDER_SITE_OTHER): Payer: Medicare Other | Admitting: Pharmacist

## 2019-08-29 ENCOUNTER — Other Ambulatory Visit: Payer: Self-pay | Admitting: Cardiology

## 2019-08-29 DIAGNOSIS — Z7901 Long term (current) use of anticoagulants: Secondary | ICD-10-CM

## 2019-08-29 LAB — POCT INR: INR: 2.2 (ref 2.0–3.0)

## 2019-08-29 NOTE — Telephone Encounter (Signed)
Please review for refill, thanks ! 

## 2019-09-03 ENCOUNTER — Other Ambulatory Visit: Payer: Self-pay | Admitting: Internal Medicine

## 2019-09-03 ENCOUNTER — Ambulatory Visit (INDEPENDENT_AMBULATORY_CARE_PROVIDER_SITE_OTHER): Payer: Medicare Other | Admitting: *Deleted

## 2019-09-03 DIAGNOSIS — I4821 Permanent atrial fibrillation: Secondary | ICD-10-CM | POA: Diagnosis not present

## 2019-09-03 LAB — CUP PACEART REMOTE DEVICE CHECK
Date Time Interrogation Session: 20201222160011
Implantable Lead Implant Date: 20040429
Implantable Lead Location: 753860
Implantable Pulse Generator Implant Date: 20110325
Pulse Gen Model: 1110
Pulse Gen Serial Number: 2313421

## 2019-09-26 ENCOUNTER — Ambulatory Visit: Payer: Medicare Other | Admitting: Internal Medicine

## 2019-09-26 ENCOUNTER — Encounter: Payer: Self-pay | Admitting: Internal Medicine

## 2019-09-26 ENCOUNTER — Other Ambulatory Visit: Payer: Self-pay

## 2019-09-26 VITALS — BP 140/90 | HR 80 | Ht 65.0 in | Wt 183.4 lb

## 2019-09-26 DIAGNOSIS — R001 Bradycardia, unspecified: Secondary | ICD-10-CM | POA: Diagnosis not present

## 2019-09-26 DIAGNOSIS — I4821 Permanent atrial fibrillation: Secondary | ICD-10-CM | POA: Diagnosis not present

## 2019-09-26 DIAGNOSIS — Z950 Presence of cardiac pacemaker: Secondary | ICD-10-CM

## 2019-09-26 DIAGNOSIS — I350 Nonrheumatic aortic (valve) stenosis: Secondary | ICD-10-CM | POA: Diagnosis not present

## 2019-09-26 DIAGNOSIS — I10 Essential (primary) hypertension: Secondary | ICD-10-CM

## 2019-09-26 NOTE — Progress Notes (Signed)
HPI [name] returns today for followup. He is a pleasant 86 yo man with chronic atrial fib, symptomatic bradycardia, s/p PPM insertion and recent episode of sob. He denies chest pain or sob.  Allergies  Allergen Reactions  . Tramadol Itching     Current Outpatient Medications  Medication Sig Dispense Refill  . acetaminophen (TYLENOL) 500 MG tablet Take 500 mg by mouth every 6 (six) hours as needed (for pain).     Marland Kitchen allopurinol (ZYLOPRIM) 100 MG tablet Take 1 tablet (100 mg total) by mouth daily. 90 tablet 3  . carvedilol (COREG) 3.125 MG tablet TAKE 1 TABLET BY MOUTH TWO  TIMES DAILY 180 tablet 3  . Cholecalciferol (VITAMIN D3) 1000 units CAPS Take 1,000 Units by mouth daily.    Marland Kitchen doxazosin (CARDURA) 4 MG tablet TAKE 1 TABLET BY MOUTH AT  BEDTIME 90 tablet 3  . finasteride (PROSCAR) 5 MG tablet Take 1 tablet (5 mg total) by mouth daily. 90 tablet 3  . latanoprost (XALATAN) 0.005 % ophthalmic solution Place 1 drop into both eyes at bedtime.   3  . loperamide (IMODIUM) 2 MG capsule Take 1 capsule (2 mg total) by mouth as needed for diarrhea or loose stools. 30 capsule 0  . Multiple Vitamins-Minerals (PRESERVISION AREDS 2 PO) Take 1 capsule by mouth 2 (two) times daily.     . potassium chloride SA (K-DUR) 20 MEQ tablet TAKE ONE-HALF TABLET BY  MOUTH DAILY 45 tablet 3  . simvastatin (ZOCOR) 20 MG tablet TAKE 1 TABLET BY MOUTH AT  BEDTIME 90 tablet 0  . warfarin (COUMADIN) 2.5 MG tablet TAKE 1/2 TO 1 TABLET DAILY AS DIRECTED BY COUMADIN CLINIC 90 tablet 0   No current facility-administered medications for this visit.   Facility-Administered Medications Ordered in Other Visits  Medication Dose Route Frequency Provider Last Rate Last Admin  . influenza  inactive virus vaccine (FLUZONE/FLUARIX) injection 0.5 mL  0.5 mL Intramuscular Once Rita Ohara, MD         Past Medical History:  Diagnosis Date  . A-fib ([place])   . Arthritis   . CAD (coronary artery disease)    GXT, neg  bruce protocol GXT  . Chronic kidney disease    RENAL INSUFFICIENCY  . Diverticulosis   . ED (erectile dysfunction)   . GERD (gastroesophageal reflux disease)   . Glaucoma   . Gout    no flare in years   . Heart disorder   . Hypertension   . Obesity     ROS:   All systems reviewed and negative except as noted in the HPI.   Past Surgical History:  Procedure Laterality Date  . CATARACT EXTRACTION, BILATERAL  2018   with lens placement   . PACEMAKER INSERTION  01/09/03  . TONSILLECTOMY    . TOTAL KNEE ARTHROPLASTY Right 02/27/2018   Procedure: RIGHT TOTAL KNEE ARTHROPLASTY;  Surgeon: Paralee Cancel, MD;  Location: [facility];  Service: Orthopedics;  Laterality: Right;  70 mins  . TOTAL KNEE ARTHROPLASTY Left 09/06/2018   Procedure: LEFT TOTAL KNEE ARTHROPLASTY;  Surgeon: Paralee Cancel, MD;  Location: [facility];  Service: Orthopedics;  Laterality: Left;  70 mins     Family History  Problem Relation Age of Onset  . Heart disease Mother   . Hypertension Mother   . Kidney disease Mother   . Stroke Mother   . Heart disease Father      Social History   Socioeconomic History  .  Marital status: Widowed    Spouse name: Not on file  . Number of children: 2  . Years of education: S-College  . Highest education level: Not on file  Occupational History    Comment: Works part time  Tobacco Use  . Smoking status: Former Smoker    Packs/day: 1.00    Years: 10.00    Pack years: 10.00    Quit date: 09/13/1979    Years since quitting: 40.0  . Smokeless tobacco: Never Used  Substance and Sexual Activity  . Alcohol use: Yes    Comment: Consumes 2-3 glasses of wine per week; now only socially   . Drug use: No  . Sexual activity: Not on file  Other Topics Concern  . Not on file  Social History Narrative  . Not on file   Social Determinants of Health   Financial Resource Strain:   . Difficulty of Paying Living Expenses: Not on file  Food Insecurity:   . Worried About Ship broker in the Last Year: Not on file  . Ran Out of Food in the Last Year: Not on file  Transportation Needs:   . Lack of Transportation (Medical): Not on file  . Lack of Transportation (Non-Medical): Not on file  Physical Activity:   . Days of Exercise per Week: Not on file  . Minutes of Exercise per Session: Not on file  Stress:   . Feeling of Stress : Not on file  Social Connections:   . Frequency of Communication with Friends and Family: Not on file  . Frequency of Social Gatherings with Friends and Family: Not on file  . Attends Religious Services: Not on file  . Active Member of Clubs or Organizations: Not on file  . Attends Archivist Meetings: Not on file  . Marital Status: Not on file  Intimate Partner Violence:   . Fear of Current or Ex-Partner: Not on file  . Emotionally Abused: Not on file  . Physically Abused: Not on file  . Sexually Abused: Not on file     BP 140/90   Pulse 80   Ht 5\' 5"  (1.651 m)   Wt 183 lb 6.4 oz (83.2 kg)   SpO2 98%   BMI 30.52 kg/m   Physical Exam:  Well appearing NAD HEENT: Unremarkable Neck:  No JVD, no thyromegally Lymphatics:  No adenopathy Back:  No CVA tenderness Lungs:  Clear with no wheezes HEART:  Regular rate rhythm, 2/6 AS murmurs, no rubs, no clicks; S2 is reduced Abd:  soft, positive bowel sounds, no organomegally, no rebound, no guarding Ext:  2 plus pulses, no edema, no cyanosis, no clubbing Skin:  No rashes no nodules Neuro:  CN II through XII intact, motor grossly intact  EKG - atrial fib with a controlled VR  DEVICE  Normal device function.  See PaceArt for details.   Assess/Plan: 1. Atrial fib - his VR is well controlled. We will follow.  2. Probable AS - on exam he sounds like he has a fairly tight valve and will undergo a 2D echo. 3. PPM - his St. Jude single chamber PPM appears to be working normally. We will recheck in several months.  4. HTN - his bp has been high in the doctors office but is  usually better at home. We will hold off on BP lowering.    [name], M.D.

## 2019-09-26 NOTE — Patient Instructions (Addendum)
Medication Instructions:  Your physician recommends that you continue on your current medications as directed. Please refer to the Current Medication list given to you today.  Labwork: None ordered.  Testing/Procedures: Your physician has requested that you have an echocardiogram. Echocardiography is a painless test that uses sound waves to create images of your heart. It provides your doctor with information about the size and shape of your heart and how well your heart's chambers and valves are working. This procedure takes approximately one hour. There are no restrictions for this procedure.  Please schedule for ECHO  Follow-Up: Your physician wants you to follow-up in: one year with Dr. Lovena Le.   You will receive a reminder letter in the mail two months in advance. If you don't receive a letter, please call our office to schedule the follow-up appointment.  Remote monitoring is used to monitor your Pacemaker from home. This monitoring reduces the number of office visits required to check your device to one time per year. It allows [place] to keep an eye on the functioning of your device to ensure it is working properly. You are scheduled for a device check from home on 12/03/2019. You may send your transmission at any time that day. If you have a wireless device, the transmission will be sent automatically. After your physician reviews your transmission, you will receive a postcard with your next transmission date.  Any Other Special Instructions Will Be Listed Below (If Applicable).  If you need a refill on your cardiac medications before your next appointment, please call your pharmacy.

## 2019-10-02 ENCOUNTER — Telehealth: Payer: Self-pay | Admitting: Family Medicine

## 2019-10-02 NOTE — Telephone Encounter (Signed)
Pt called and states that he is scheduled to have to COVID vaccine on Monday. He was informed due to the fact that he is on blood thinners he needs a letter from PCP stating that it is ok to received. Please advise pt at [phone].

## 2019-10-02 NOTE — Telephone Encounter (Signed)
Give him a note 

## 2019-10-03 NOTE — Telephone Encounter (Signed)
Letter was done and a copy placed up front for pt daughter. kh

## 2019-10-04 ENCOUNTER — Ambulatory Visit (INDEPENDENT_AMBULATORY_CARE_PROVIDER_SITE_OTHER): Payer: Medicare Other | Admitting: Pharmacist Clinician (PhC)/ Clinical Pharmacy Specialist

## 2019-10-04 ENCOUNTER — Other Ambulatory Visit: Payer: Self-pay

## 2019-10-04 ENCOUNTER — Encounter: Payer: Self-pay | Admitting: Pharmacist Clinician (PhC)/ Clinical Pharmacy Specialist

## 2019-10-04 DIAGNOSIS — Z7901 Long term (current) use of anticoagulants: Secondary | ICD-10-CM | POA: Diagnosis not present

## 2019-10-04 DIAGNOSIS — I4821 Permanent atrial fibrillation: Secondary | ICD-10-CM

## 2019-10-04 LAB — POCT INR: INR: 1.6 — AB (ref 2.0–3.0)

## 2019-10-10 ENCOUNTER — Ambulatory Visit (HOSPITAL_COMMUNITY): Payer: Medicare Other | Attending: Cardiology

## 2019-10-10 ENCOUNTER — Other Ambulatory Visit: Payer: Self-pay

## 2019-10-10 DIAGNOSIS — I350 Nonrheumatic aortic (valve) stenosis: Secondary | ICD-10-CM | POA: Diagnosis not present

## 2019-10-14 ENCOUNTER — Telehealth: Payer: Self-pay | Admitting: Internal Medicine

## 2019-10-14 NOTE — Telephone Encounter (Signed)
Patient calling in regards to echo results.

## 2019-10-16 NOTE — Telephone Encounter (Signed)
Touched base with Pt to make sure he had seen results of ECHO on MyChart and did not have any further questions.  Pt states he has no further questions.  States he only salts his eggs and watermelon.

## 2019-10-17 ENCOUNTER — Other Ambulatory Visit: Payer: Self-pay | Admitting: Internal Medicine

## 2019-10-17 ENCOUNTER — Other Ambulatory Visit: Payer: Self-pay | Admitting: Family Medicine

## 2019-10-17 ENCOUNTER — Other Ambulatory Visit: Payer: Self-pay

## 2019-10-17 MED ORDER — WARFARIN SODIUM 2.5 MG PO TABS: ORAL_TABLET | ORAL | 0 refills | Status: DC

## 2019-10-25 ENCOUNTER — Other Ambulatory Visit: Payer: Self-pay

## 2019-10-25 ENCOUNTER — Ambulatory Visit (INDEPENDENT_AMBULATORY_CARE_PROVIDER_SITE_OTHER): Payer: Medicare Other | Admitting: Pharmacist Clinician (PhC)/ Clinical Pharmacy Specialist

## 2019-10-25 DIAGNOSIS — Z7901 Long term (current) use of anticoagulants: Secondary | ICD-10-CM | POA: Diagnosis not present

## 2019-10-25 DIAGNOSIS — I4821 Permanent atrial fibrillation: Secondary | ICD-10-CM | POA: Diagnosis not present

## 2019-10-25 LAB — POCT INR: INR: 1.9 — AB (ref 2.0–3.0)

## 2019-11-29 ENCOUNTER — Other Ambulatory Visit: Payer: Self-pay

## 2019-11-29 ENCOUNTER — Ambulatory Visit (INDEPENDENT_AMBULATORY_CARE_PROVIDER_SITE_OTHER): Payer: Medicare Other | Admitting: Pharmacist Clinician (PhC)/ Clinical Pharmacy Specialist

## 2019-11-29 DIAGNOSIS — Z7901 Long term (current) use of anticoagulants: Secondary | ICD-10-CM

## 2019-11-29 DIAGNOSIS — I4821 Permanent atrial fibrillation: Secondary | ICD-10-CM | POA: Diagnosis not present

## 2019-11-29 LAB — POCT INR: INR: 1.8 — AB (ref 2.0–3.0)

## 2019-11-29 NOTE — Patient Instructions (Signed)
Take 2 tablets today, then increase dose to 2.5 mg daily except 3.75 mg each Monday and Friday,  Repeat INR In 3 weeks

## 2019-12-03 ENCOUNTER — Ambulatory Visit (INDEPENDENT_AMBULATORY_CARE_PROVIDER_SITE_OTHER): Payer: Medicare Other | Admitting: *Deleted

## 2019-12-03 DIAGNOSIS — I4821 Permanent atrial fibrillation: Secondary | ICD-10-CM

## 2019-12-03 LAB — CUP PACEART REMOTE DEVICE CHECK
Battery Remaining Longevity: 89 mo
Battery Remaining Percentage: 73 %
Battery Voltage: 2.92 V
Brady Statistic RV Percent Paced: 31 %
Date Time Interrogation Session: 20210323133821
Implantable Lead Implant Date: 20040429
Implantable Lead Location: 753860
Implantable Pulse Generator Implant Date: 20110325
Lead Channel Impedance Value: 430 Ohm
Lead Channel Pacing Threshold Amplitude: 1 V
Lead Channel Pacing Threshold Pulse Width: 0.9 ms
Lead Channel Sensing Intrinsic Amplitude: 2.5 mV
Lead Channel Setting Pacing Amplitude: 2.5 V
Lead Channel Setting Pacing Pulse Width: 0.9 ms
Lead Channel Setting Sensing Sensitivity: 0.7 mV
Pulse Gen Model: 1110
Pulse Gen Serial Number: 2313421

## 2019-12-04 NOTE — Progress Notes (Signed)
PPM Remote  

## 2019-12-20 ENCOUNTER — Ambulatory Visit (INDEPENDENT_AMBULATORY_CARE_PROVIDER_SITE_OTHER): Payer: Medicare Other | Admitting: Pharmacist

## 2019-12-20 ENCOUNTER — Other Ambulatory Visit: Payer: Self-pay

## 2019-12-20 DIAGNOSIS — Z7901 Long term (current) use of anticoagulants: Secondary | ICD-10-CM | POA: Diagnosis not present

## 2019-12-20 DIAGNOSIS — I4821 Permanent atrial fibrillation: Secondary | ICD-10-CM

## 2019-12-20 LAB — POCT INR: INR: 2.4 (ref 2.0–3.0)

## 2019-12-23 ENCOUNTER — Other Ambulatory Visit: Payer: Self-pay

## 2019-12-23 MED ORDER — WARFARIN SODIUM 2.5 MG PO TABS: ORAL_TABLET | ORAL | 0 refills | Status: DC

## 2020-01-15 ENCOUNTER — Ambulatory Visit (INDEPENDENT_AMBULATORY_CARE_PROVIDER_SITE_OTHER): Payer: Medicare Other | Admitting: *Deleted

## 2020-01-15 ENCOUNTER — Other Ambulatory Visit: Payer: Self-pay

## 2020-01-15 DIAGNOSIS — Z7901 Long term (current) use of anticoagulants: Secondary | ICD-10-CM

## 2020-01-15 DIAGNOSIS — Z5181 Encounter for therapeutic drug level monitoring: Secondary | ICD-10-CM

## 2020-01-15 LAB — POCT INR: INR: 2.1 (ref 2.0–3.0)

## 2020-01-15 NOTE — Patient Instructions (Addendum)
Description   Continue to take 2.5 mg daily except 3.75 mg each Monday and Friday,  Repeat INR In 5 weeks

## 2020-02-19 ENCOUNTER — Other Ambulatory Visit: Payer: Self-pay

## 2020-02-19 ENCOUNTER — Ambulatory Visit (INDEPENDENT_AMBULATORY_CARE_PROVIDER_SITE_OTHER): Payer: Medicare Other | Admitting: Pharmacist Clinician (PhC)/ Clinical Pharmacy Specialist

## 2020-02-19 DIAGNOSIS — I4821 Permanent atrial fibrillation: Secondary | ICD-10-CM | POA: Diagnosis not present

## 2020-02-19 DIAGNOSIS — Z7901 Long term (current) use of anticoagulants: Secondary | ICD-10-CM

## 2020-02-19 LAB — POCT INR: INR: 2.2 (ref 2.0–3.0)

## 2020-02-21 DIAGNOSIS — H401132 Primary open-angle glaucoma, bilateral, moderate stage: Secondary | ICD-10-CM | POA: Diagnosis not present

## 2020-02-21 DIAGNOSIS — Z961 Presence of intraocular lens: Secondary | ICD-10-CM | POA: Diagnosis not present

## 2020-02-21 DIAGNOSIS — H353132 Nonexudative age-related macular degeneration, bilateral, intermediate dry stage: Secondary | ICD-10-CM | POA: Diagnosis not present

## 2020-03-03 ENCOUNTER — Ambulatory Visit (INDEPENDENT_AMBULATORY_CARE_PROVIDER_SITE_OTHER): Payer: Medicare Other | Admitting: *Deleted

## 2020-03-03 DIAGNOSIS — I4821 Permanent atrial fibrillation: Secondary | ICD-10-CM

## 2020-03-03 LAB — CUP PACEART REMOTE DEVICE CHECK
Battery Remaining Longevity: 89 mo
Battery Remaining Percentage: 73 %
Battery Voltage: 2.92 V
Brady Statistic RV Percent Paced: 33 %
Date Time Interrogation Session: 20210622101446
Implantable Lead Implant Date: 20040429
Implantable Lead Location: 753860
Implantable Pulse Generator Implant Date: 20110325
Lead Channel Impedance Value: 440 Ohm
Lead Channel Pacing Threshold Amplitude: 1 V
Lead Channel Pacing Threshold Pulse Width: 0.9 ms
Lead Channel Sensing Intrinsic Amplitude: 2.7 mV
Lead Channel Setting Pacing Amplitude: 2.5 V
Lead Channel Setting Pacing Pulse Width: 0.9 ms
Lead Channel Setting Sensing Sensitivity: 0.7 mV
Pulse Gen Model: 1110
Pulse Gen Serial Number: 2313421

## 2020-03-04 NOTE — Progress Notes (Signed)
Remote pacemaker transmission.   

## 2020-03-21 ENCOUNTER — Other Ambulatory Visit: Payer: Self-pay | Admitting: Family Medicine

## 2020-04-01 ENCOUNTER — Ambulatory Visit (INDEPENDENT_AMBULATORY_CARE_PROVIDER_SITE_OTHER): Payer: Medicare Other | Admitting: Pharmacist Clinician (PhC)/ Clinical Pharmacy Specialist

## 2020-04-01 ENCOUNTER — Other Ambulatory Visit: Payer: Self-pay

## 2020-04-01 DIAGNOSIS — Z7901 Long term (current) use of anticoagulants: Secondary | ICD-10-CM

## 2020-04-01 LAB — POCT INR: INR: 2 (ref 2.0–3.0)

## 2020-04-02 ENCOUNTER — Other Ambulatory Visit: Payer: Self-pay | Admitting: Family Medicine

## 2020-04-02 DIAGNOSIS — R3911 Hesitancy of micturition: Secondary | ICD-10-CM

## 2020-04-08 ENCOUNTER — Encounter: Payer: Self-pay | Admitting: Family Medicine

## 2020-04-20 ENCOUNTER — Encounter: Payer: Self-pay | Admitting: Family Medicine

## 2020-04-20 ENCOUNTER — Ambulatory Visit (INDEPENDENT_AMBULATORY_CARE_PROVIDER_SITE_OTHER): Payer: Medicare Other | Admitting: Family Medicine

## 2020-04-20 ENCOUNTER — Other Ambulatory Visit: Payer: Self-pay

## 2020-04-20 VITALS — BP 162/90 | HR 65 | Wt 183.8 lb

## 2020-04-20 DIAGNOSIS — I4821 Permanent atrial fibrillation: Secondary | ICD-10-CM | POA: Diagnosis not present

## 2020-04-20 DIAGNOSIS — Z79899 Other long term (current) drug therapy: Secondary | ICD-10-CM | POA: Diagnosis not present

## 2020-04-20 DIAGNOSIS — M109 Gout, unspecified: Secondary | ICD-10-CM

## 2020-04-20 DIAGNOSIS — R2689 Other abnormalities of gait and mobility: Secondary | ICD-10-CM

## 2020-04-20 DIAGNOSIS — Z7901 Long term (current) use of anticoagulants: Secondary | ICD-10-CM | POA: Diagnosis not present

## 2020-04-20 NOTE — Progress Notes (Signed)
   Subjective:    Patient ID: [name]., male    DOB: Sep 11, 1931, 86 y.o.   MRN: [id number]  HPI He notes difficulty over the last several months with his balance.  He states that when he gazes up, he feels unsteady but has had no blurred vision, double vision, headache, weakness, history of falls.  He notes that he does have a slightly shuffling gait at the present time but has not fallen.  He continues on his current medications including Coumadin. He also complains of some left wrist swelling and discomfort.  He does have a previous history of gout.   Review of Systems     Objective:   Physical Exam Alert and in no distress.  EOMI.  Cerebellar testing was negative.  DTRs normal.  Normal finger-to-nose.  Tympanic membranes and canals are normal. Pharyngeal area is normal. Neck is supple without adenopathy or thyromegaly. Cardiac exam shows an irregular  rhythm with 1/6 SEM. Lungs are clear to auscultation. Exam of the left wrist does show some swelling and carpal area.  It is nontender not hot or erythematous.       Assessment & Plan:  Balance problem - Plan: CBC with Differential/Platelet, Comprehensive metabolic panel  Gouty arthritis  Encounter for long-term (current) use of medications  Permanent atrial fibrillation ([place])  Long term (current) use of anticoagulants I explained that the balance issue is more perception in reality as he does have good balance.  I think this is probably mainly deconditioning.  Strongly encouraged him to get involved in [employer] also recommend Tylenol for the wrist pain as he is on Coumadin.  He was comfortable with that.

## 2020-04-21 LAB — CBC WITH DIFFERENTIAL/PLATELET
Basophils Absolute: 0.1 10*3/uL (ref 0.0–0.2)
Basos: 1 %
EOS (ABSOLUTE): 0.5 10*3/uL — ABNORMAL HIGH (ref 0.0–0.4)
Eos: 7 %
Hematocrit: 43.4 % (ref 37.5–51.0)
Hemoglobin: 13.6 g/dL (ref 13.0–17.7)
Immature Grans (Abs): 0 10*3/uL (ref 0.0–0.1)
Immature Granulocytes: 0 %
Lymphocytes Absolute: 1.3 10*3/uL (ref 0.7–3.1)
Lymphs: 19 %
MCH: 29.8 pg (ref 26.6–33.0)
MCHC: 31.3 g/dL — ABNORMAL LOW (ref 31.5–35.7)
MCV: 95 fL (ref 79–97)
Monocytes Absolute: 0.6 10*3/uL (ref 0.1–0.9)
Monocytes: 9 %
Neutrophils Absolute: 4.5 10*3/uL (ref 1.4–7.0)
Neutrophils: 64 %
Platelets: 147 10*3/uL — ABNORMAL LOW (ref 150–450)
RBC: 4.57 x10E6/uL (ref 4.14–5.80)
RDW: 14.2 % (ref 11.6–15.4)
WBC: 7 10*3/uL (ref 3.4–10.8)

## 2020-04-21 LAB — COMPREHENSIVE METABOLIC PANEL
ALT: 12 IU/L (ref 0–44)
AST: 22 IU/L (ref 0–40)
Albumin/Globulin Ratio: 1.8 (ref 1.2–2.2)
Albumin: 4.3 g/dL (ref 3.6–4.6)
Alkaline Phosphatase: 92 IU/L (ref 48–121)
BUN/Creatinine Ratio: 16 (ref 10–24)
BUN: 22 mg/dL (ref 8–27)
Bilirubin Total: 0.9 mg/dL (ref 0.0–1.2)
CO2: 27 mmol/L (ref 20–29)
Calcium: 10 mg/dL (ref 8.6–10.2)
Chloride: 105 mmol/L (ref 96–106)
Creatinine, Ser: 1.36 mg/dL — ABNORMAL HIGH (ref 0.76–1.27)
GFR calc Af Amer: 54 mL/min/{1.73_m2} — ABNORMAL LOW (ref >59)
GFR calc non Af Amer: 46 mL/min/{1.73_m2} — ABNORMAL LOW (ref >59)
Globulin, Total: 2.4 g/dL (ref 1.5–4.5)
Glucose: 81 mg/dL (ref 65–99)
Potassium: 5.4 mmol/L — ABNORMAL HIGH (ref 3.5–5.2)
Sodium: 144 mmol/L (ref 134–144)
Total Protein: 6.7 g/dL (ref 6.0–8.5)

## 2020-05-04 ENCOUNTER — Other Ambulatory Visit: Payer: Self-pay | Admitting: Internal Medicine

## 2020-05-13 ENCOUNTER — Other Ambulatory Visit: Payer: Self-pay

## 2020-05-13 ENCOUNTER — Ambulatory Visit (INDEPENDENT_AMBULATORY_CARE_PROVIDER_SITE_OTHER): Payer: Medicare Other

## 2020-05-13 DIAGNOSIS — Z7901 Long term (current) use of anticoagulants: Secondary | ICD-10-CM

## 2020-05-13 LAB — POCT INR: INR: 2.5 (ref 2.0–3.0)

## 2020-05-13 NOTE — Patient Instructions (Signed)
Continue to take 2.5 mg daily except 3.75 mg each Monday and Friday,  Repeat INR In 6 weeks  

## 2020-05-17 ENCOUNTER — Other Ambulatory Visit: Payer: Self-pay | Admitting: Internal Medicine

## 2020-05-17 ENCOUNTER — Other Ambulatory Visit: Payer: Self-pay | Admitting: Family Medicine

## 2020-05-19 IMAGING — CR DG FOOT COMPLETE 3+V*L*
3 series · 3 of 3 positions shown · non-contrast
Comparison: None

CLINICAL DATA: Left foot pain for 2 days

EXAM:
LEFT FOOT - COMPLETE 3+ VIEW

[x foot ap left]
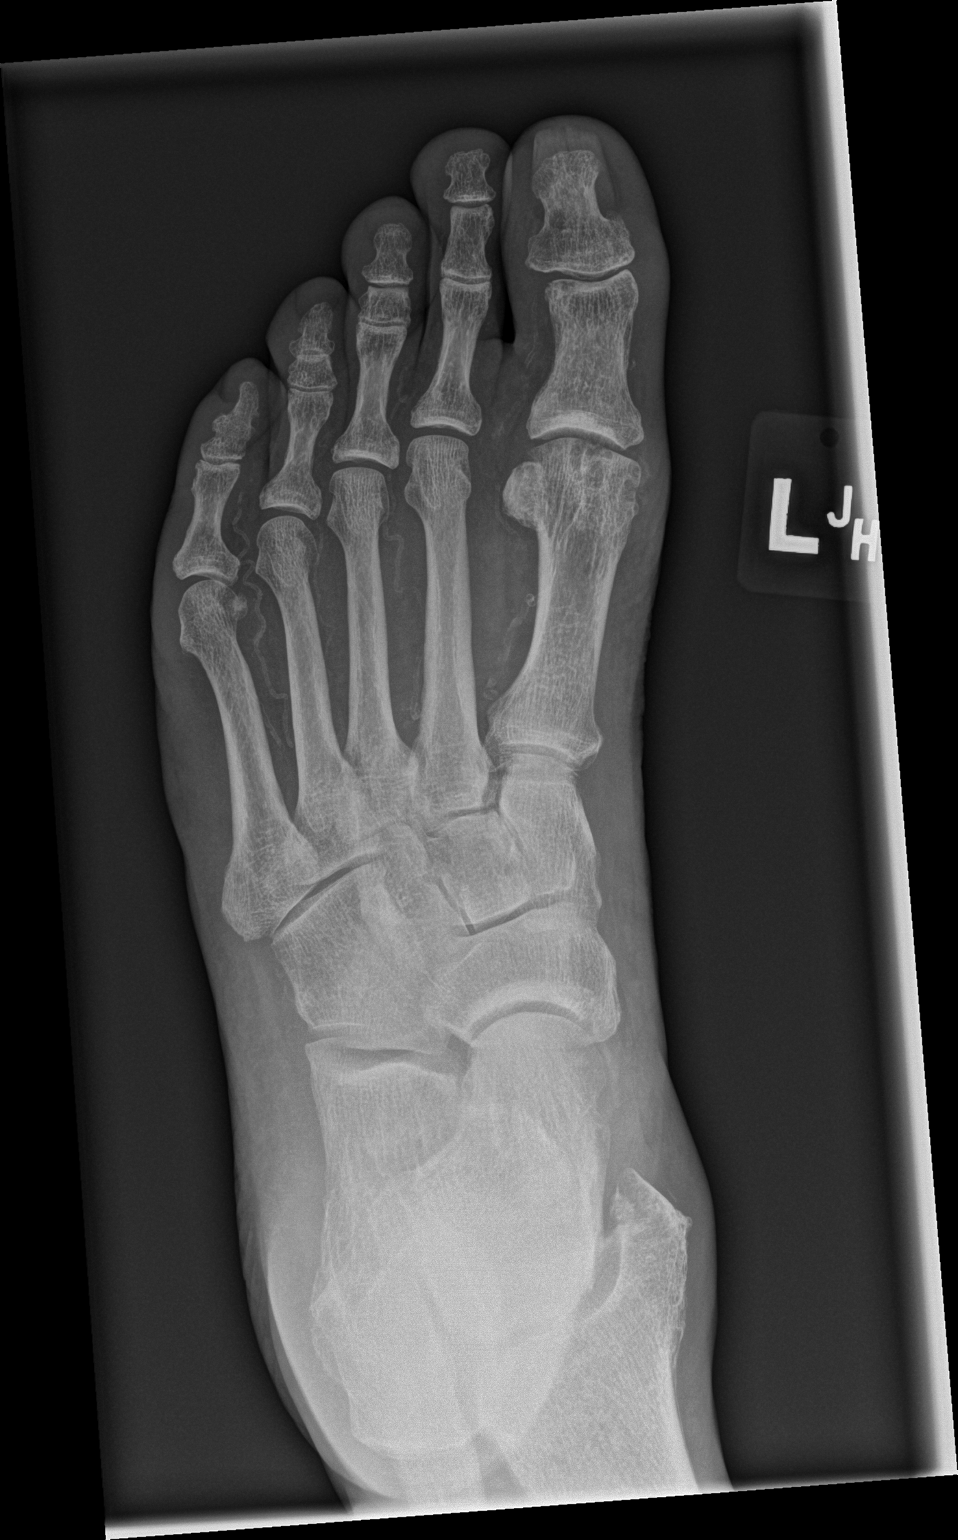

[x foot obl left]
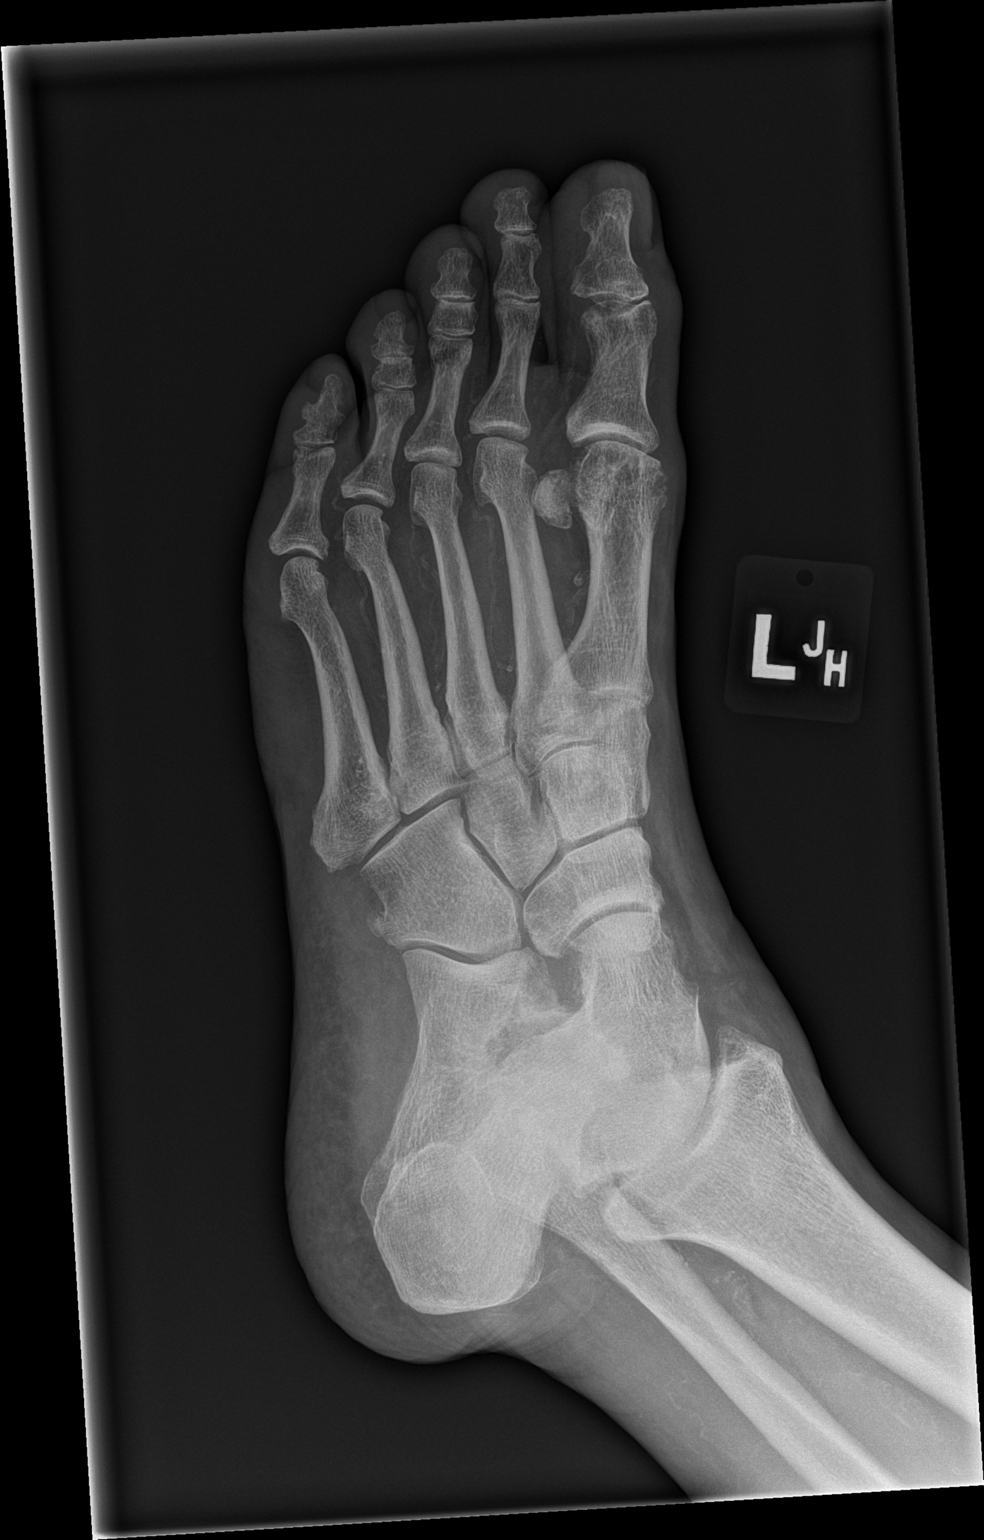

[x foot lat left]
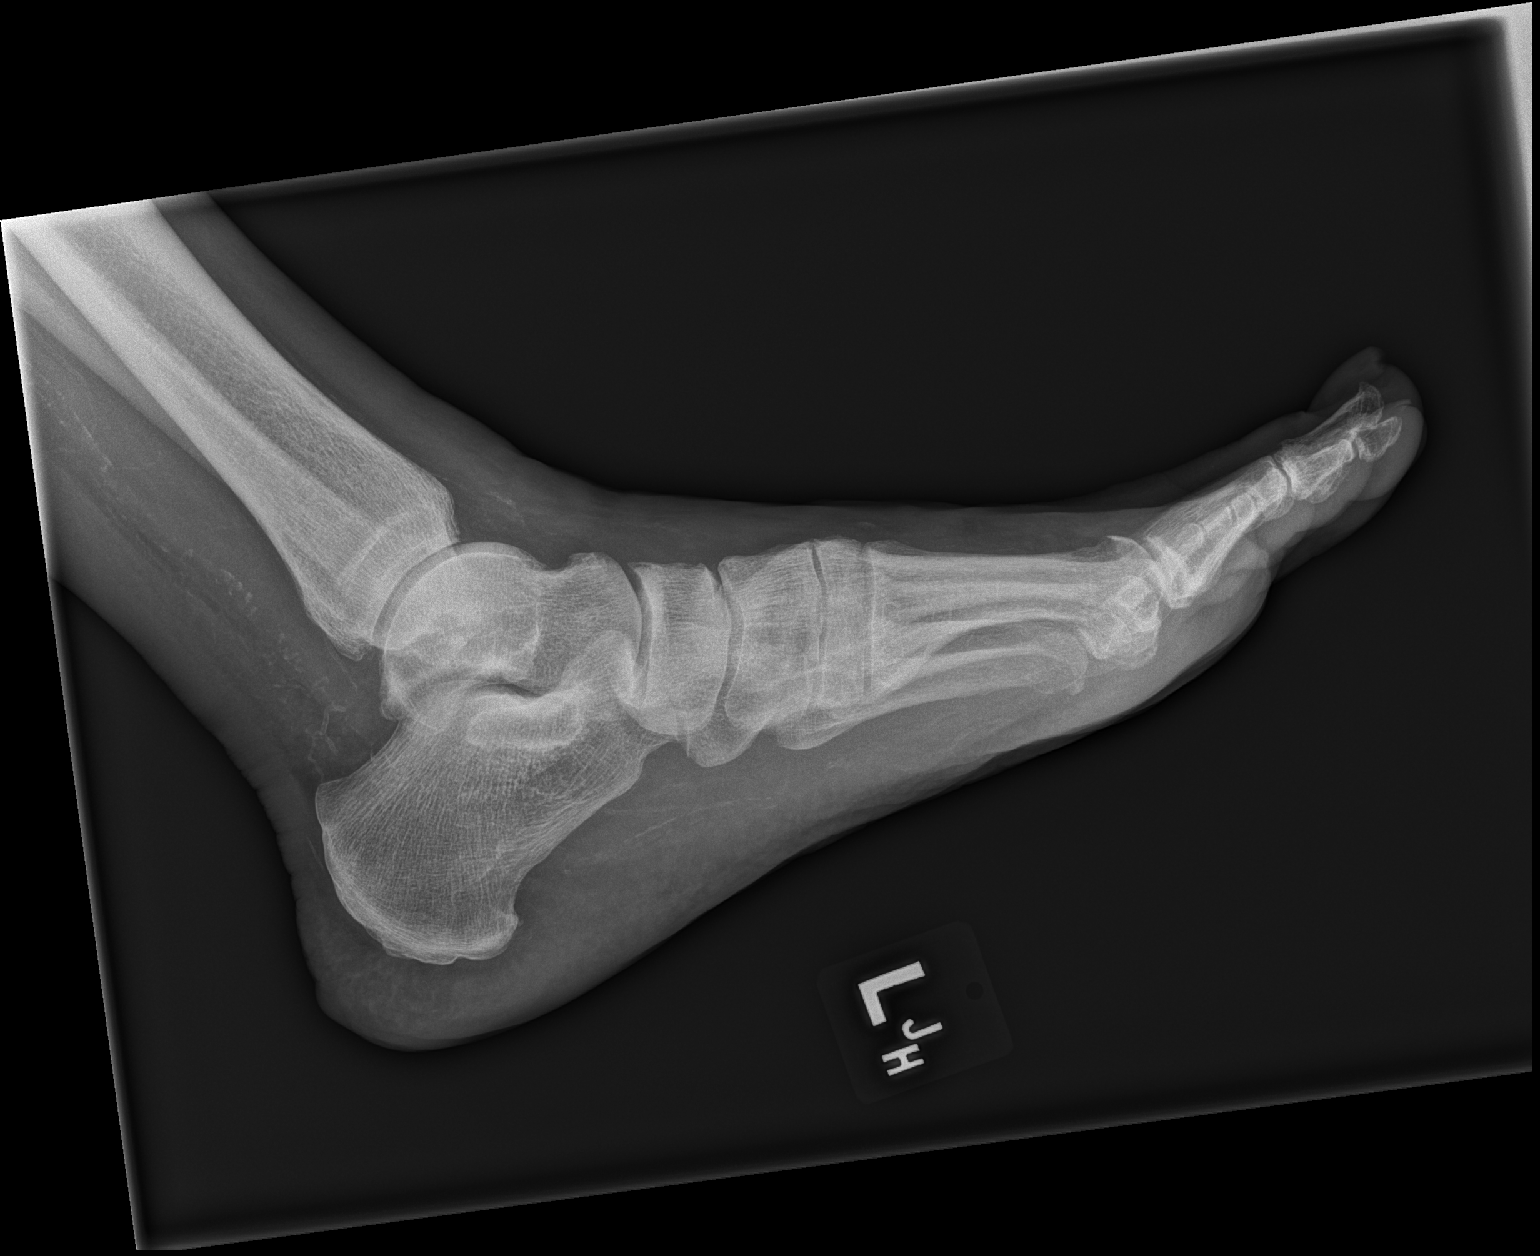

[3 of 3 positions shown; findings below may reference images not displayed]

FINDINGS: Osseous demineralization.

Joint space narrowing with subchondral cystic changes at first MTP
joint.

Remaining joint spaces preserved.

No acute fracture, dislocation, or bone destruction.

Scattered small vessel vascular calcifications.
IMPRESSION: Degenerative changes LEFT first MTP joint.

No acute osseous findings.

## 2020-05-28 DIAGNOSIS — Z1159 Encounter for screening for other viral diseases: Secondary | ICD-10-CM | POA: Diagnosis not present

## 2020-05-28 DIAGNOSIS — Z01818 Encounter for other preprocedural examination: Secondary | ICD-10-CM | POA: Diagnosis not present

## 2020-06-02 ENCOUNTER — Ambulatory Visit (INDEPENDENT_AMBULATORY_CARE_PROVIDER_SITE_OTHER): Payer: Medicare Other | Admitting: *Deleted

## 2020-06-02 DIAGNOSIS — I4821 Permanent atrial fibrillation: Secondary | ICD-10-CM | POA: Diagnosis not present

## 2020-06-02 LAB — CUP PACEART REMOTE DEVICE CHECK
Battery Remaining Longevity: 79 mo
Battery Remaining Percentage: 65 %
Battery Voltage: 2.9 V
Brady Statistic RV Percent Paced: 35 %
Date Time Interrogation Session: 20210921143326
Implantable Lead Implant Date: 20040429
Implantable Lead Location: 753860
Implantable Pulse Generator Implant Date: 20110325
Lead Channel Impedance Value: 460 Ohm
Lead Channel Pacing Threshold Amplitude: 1 V
Lead Channel Pacing Threshold Pulse Width: 0.9 ms
Lead Channel Sensing Intrinsic Amplitude: 1.6 mV
Lead Channel Setting Pacing Amplitude: 2.5 V
Lead Channel Setting Pacing Pulse Width: 0.9 ms
Lead Channel Setting Sensing Sensitivity: 0.7 mV
Pulse Gen Model: 1110
Pulse Gen Serial Number: 2313421

## 2020-06-03 NOTE — Progress Notes (Signed)
Remote pacemaker transmission.   

## 2020-06-13 IMAGING — CT CT ANGIO CHEST
2 of 6 series · 18 of 36 positions shown · IV contrast (iopamidol)
Comparison: Radiographs earlier this day.

CLINICAL DATA: 87-year-old with worsening shortness of breath.
Recent knee surgery.

EXAM:
CT ANGIOGRAPHY CHEST WITH CONTRAST
TECHNIQUE: Multidetector CT imaging of the chest was performed using the
standard protocol during bolus administration of intravenous
contrast. Multiplanar CT image reconstructions and MIPs were
obtained to evaluate the vascular anatomy.
CONTRAST:  63mL 29ZBVJ-UKC IOPAMIDOL (29ZBVJ-UKC) INJECTION 76%

[Series 7: pe thins · axial · 0.69mm/px · z∈[+1222,+1492]mm · 17 of 430 slices shown]
[im 22/430  lung]
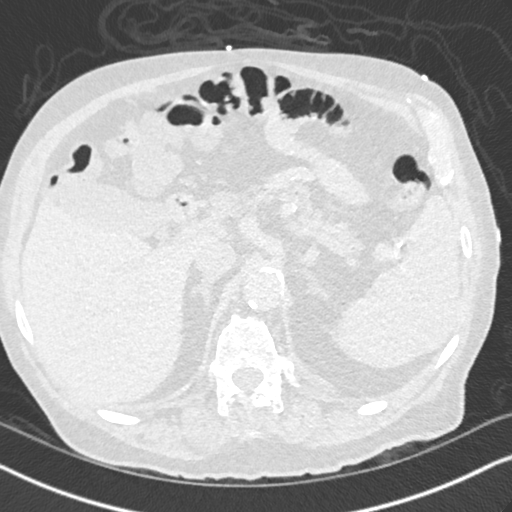
[im 43/430  mediastinal]
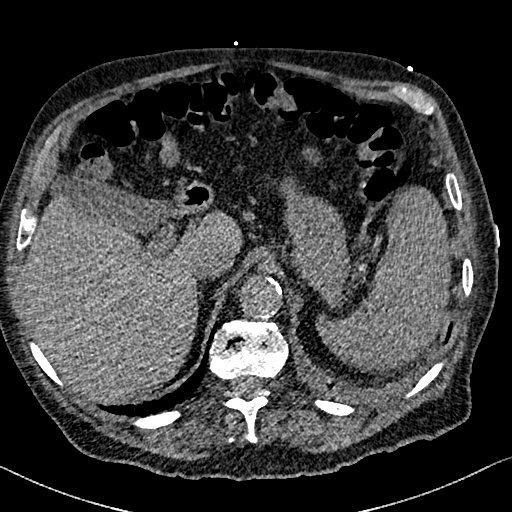
[im 65/430  lung]
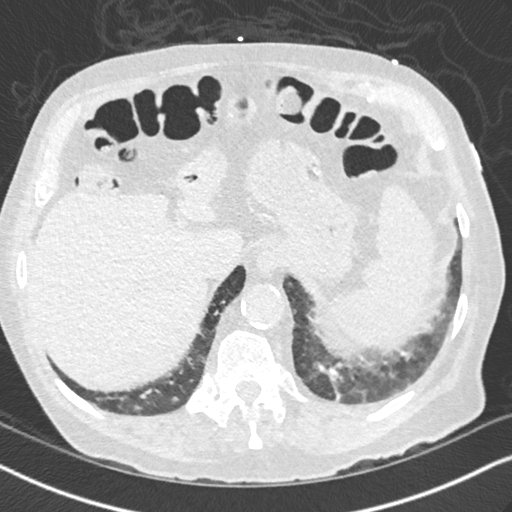
[im 86/430  mediastinal]
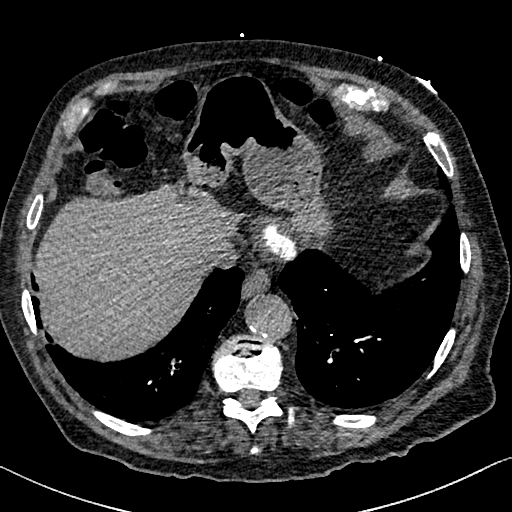
[im 129/430  lung]
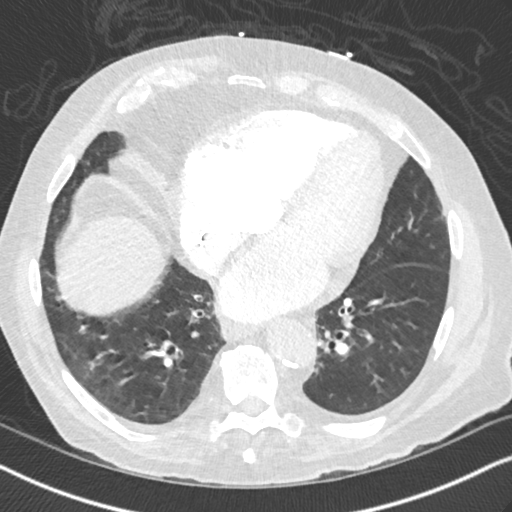
[im 151/430  mediastinal]
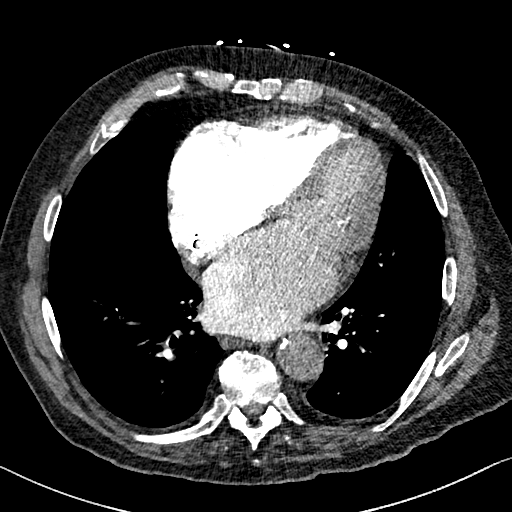
[im 172/430  lung]
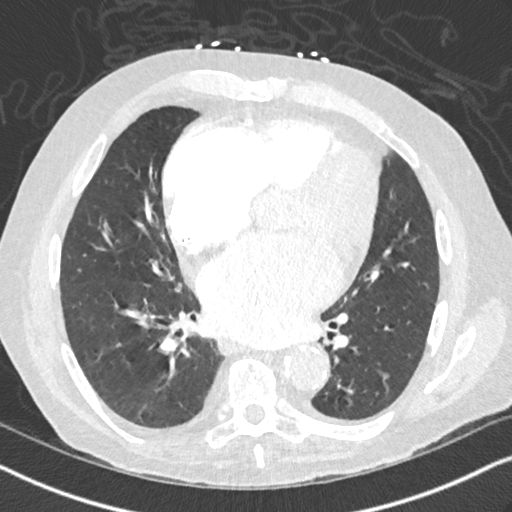
[im 194/430  mediastinal]
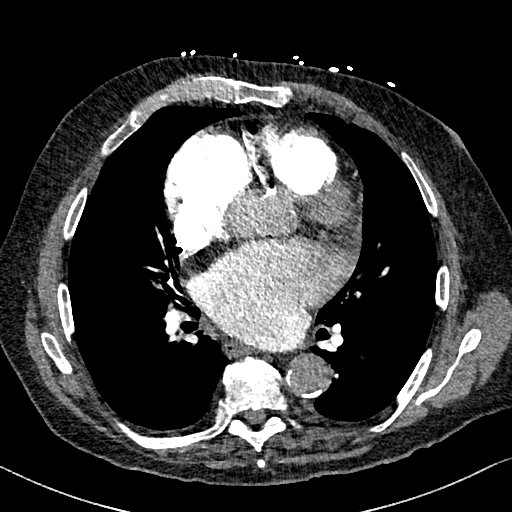
[im 215/430  lung]
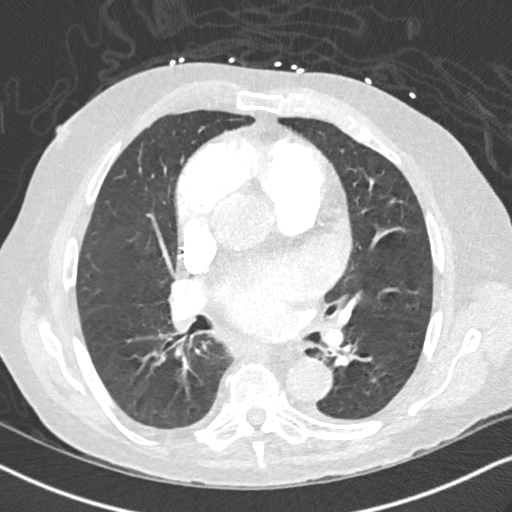
[im 236/430  mediastinal]
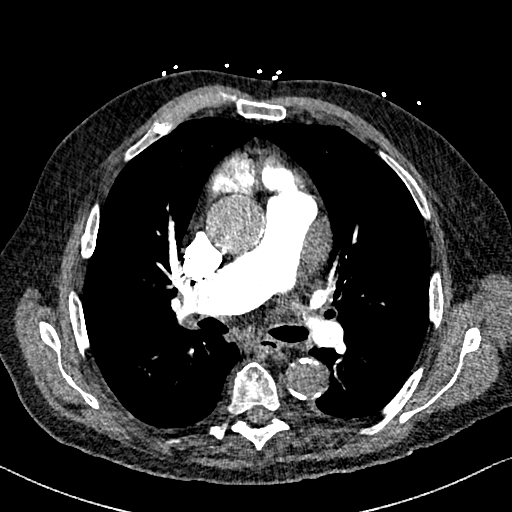
[im 258/430  lung]
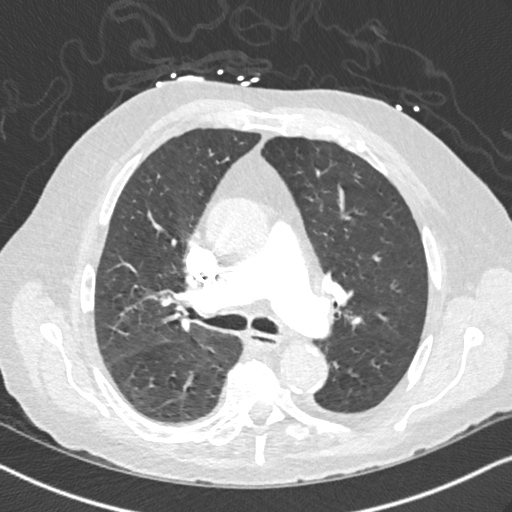
[im 279/430  mediastinal]
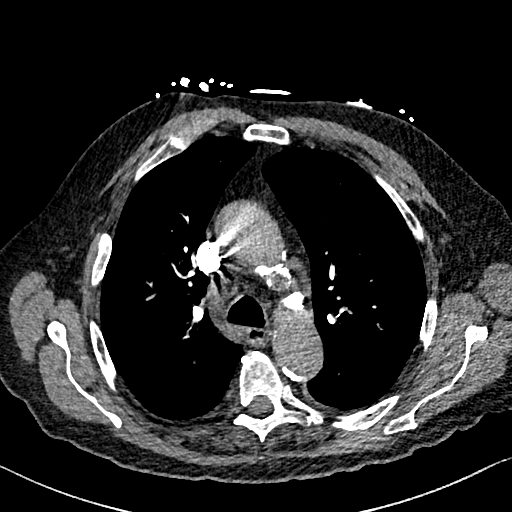
[im 301/430  lung]
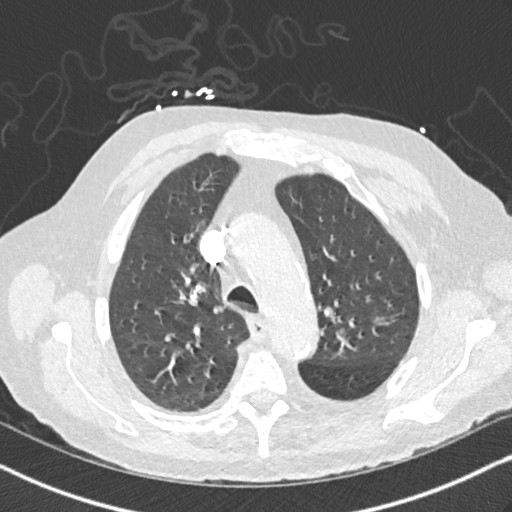
[im 344/430  mediastinal]
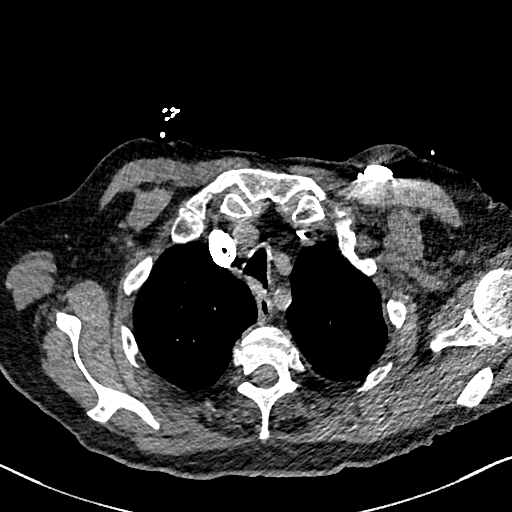
[im 365/430  lung]
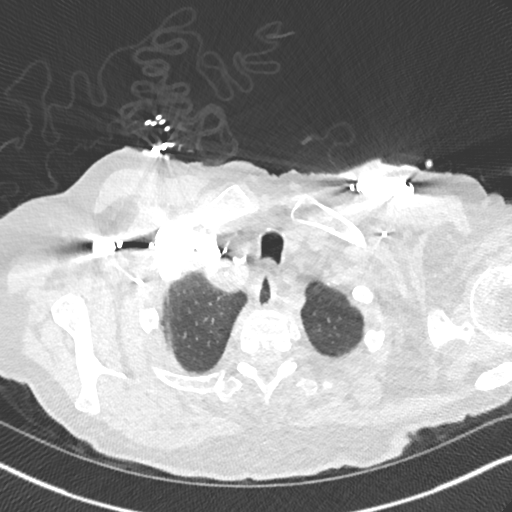
[im 387/430  mediastinal]
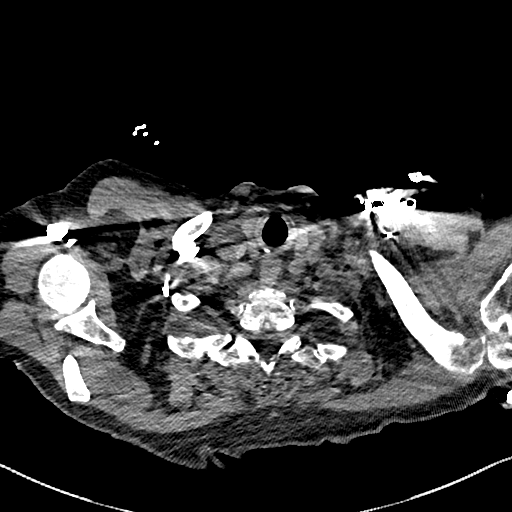
[im 408/430  lung]
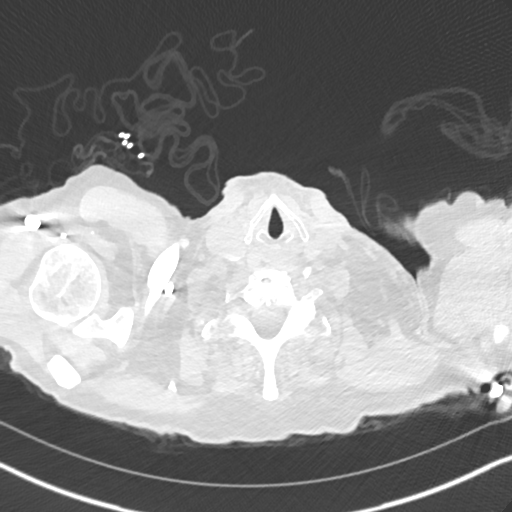

[Series 8: pe 2mm cor · coronal · 0.59mm/px · 1 of 138 slices shown]
[im 69/138  mediastinal]
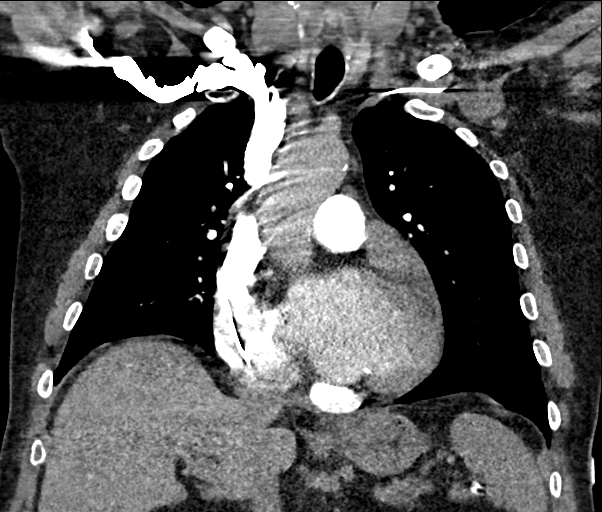

[18 of 36 positions shown; findings below may reference images not displayed]

FINDINGS: Cardiovascular: There are no filling defects within the pulmonary
arteries to suggest pulmonary embolus. Atherosclerosis of the
thoracic aorta without aneurysm. Can not assess for dissection given
phase contrast timing tailored for pulmonary embolus evaluation.
Single lead left-sided pacemaker in place with lead in the right
ventricle. Multi chamber cardiomegaly, with prominent left atrial
enlargement. There are coronary artery calcifications. No
pericardial effusion.

Mediastinum/Nodes: No enlarged mediastinal or hilar lymph nodes. The
esophagus is decompressed. No visualized thyroid nodule.

Lungs/Pleura: Mild smudgy ground-glass opacities in the dependent
lower lobes. Dependent left lower lobe atelectasis. Trace left
pleural thickening without frank effusion. Mild bilateral lower lobe
bronchiectasis, greater on the right. No pulmonary edema. A 3 mm
nodule in the right middle lobe image 80 series [DATE] be fissural
thickening.

Upper Abdomen: No acute findings.

Musculoskeletal: Multilevel degenerative change in the spine. There
are no acute or suspicious osseous abnormalities. 2

Review of the MIP images confirms the above findings.
IMPRESSION: 1. No pulmonary embolus.
2. Faint tree in bud opacities in the dependent lower lobes with
concurrent bronchiectasis. Findings are suggestive of aspiration.
Bronchiolitis also considered.
3. Tiny 3 mm right middle lobe nodule, this may simply represent
fissural thickening versus true pulmonary nodule. No follow-up
needed if patient is low-risk. Non-contrast chest CT can be
considered in 12 months if patient is high-risk. This recommendation
follows the consensus statement: Guidelines for Management of
Incidental Pulmonary Nodules Detected on CT Images: From the
4. Multi chamber cardiomegaly. Coronary artery calcifications.
Aortic Atherosclerosis (L4RSJ-CIJ.J).

## 2020-06-24 ENCOUNTER — Other Ambulatory Visit: Payer: Self-pay

## 2020-06-24 ENCOUNTER — Ambulatory Visit (INDEPENDENT_AMBULATORY_CARE_PROVIDER_SITE_OTHER): Payer: Medicare Other

## 2020-06-24 DIAGNOSIS — Z7901 Long term (current) use of anticoagulants: Secondary | ICD-10-CM | POA: Diagnosis not present

## 2020-06-24 LAB — POCT INR: INR: 3.7 — AB (ref 2.0–3.0)

## 2020-06-24 NOTE — Patient Instructions (Signed)
Hold today and then Continue to take 2.5 mg daily except 3.75 mg each Monday and Friday,  Repeat INR In 4 weeks

## 2020-07-08 ENCOUNTER — Other Ambulatory Visit: Payer: Self-pay | Admitting: Internal Medicine

## 2020-07-13 DIAGNOSIS — D3611 Benign neoplasm of peripheral nerves and autonomic nervous system of face, head, and neck: Secondary | ICD-10-CM | POA: Diagnosis not present

## 2020-07-13 DIAGNOSIS — L821 Other seborrheic keratosis: Secondary | ICD-10-CM | POA: Diagnosis not present

## 2020-07-13 DIAGNOSIS — Z85828 Personal history of other malignant neoplasm of skin: Secondary | ICD-10-CM | POA: Diagnosis not present

## 2020-07-13 DIAGNOSIS — D3612 Benign neoplasm of peripheral nerves and autonomic nervous system, upper limb, including shoulder: Secondary | ICD-10-CM | POA: Diagnosis not present

## 2020-07-13 DIAGNOSIS — L570 Actinic keratosis: Secondary | ICD-10-CM | POA: Diagnosis not present

## 2020-07-22 ENCOUNTER — Ambulatory Visit (INDEPENDENT_AMBULATORY_CARE_PROVIDER_SITE_OTHER): Payer: Medicare Other

## 2020-07-22 DIAGNOSIS — Z7901 Long term (current) use of anticoagulants: Secondary | ICD-10-CM | POA: Diagnosis not present

## 2020-07-22 LAB — POCT INR: INR: 2.7 (ref 2.0–3.0)

## 2020-07-22 NOTE — Patient Instructions (Signed)
Continue to take 2.5 mg daily except 3.75 mg each Monday and Friday,  Repeat INR In 6 weeks

## 2020-09-02 ENCOUNTER — Ambulatory Visit (INDEPENDENT_AMBULATORY_CARE_PROVIDER_SITE_OTHER): Payer: Medicare Other

## 2020-09-02 ENCOUNTER — Other Ambulatory Visit: Payer: Self-pay

## 2020-09-02 DIAGNOSIS — I4821 Permanent atrial fibrillation: Secondary | ICD-10-CM | POA: Diagnosis not present

## 2020-09-02 DIAGNOSIS — Z7901 Long term (current) use of anticoagulants: Secondary | ICD-10-CM | POA: Diagnosis not present

## 2020-09-02 LAB — POCT INR: INR: 4.4 — AB (ref 2.0–3.0)

## 2020-09-02 NOTE — Patient Instructions (Signed)
Hold today and tomorrow and then Continue to take 2.5 mg daily except 3.75 mg each Monday and Friday,  Repeat INR In 4 weeks

## 2020-09-03 LAB — CUP PACEART REMOTE DEVICE CHECK
Battery Remaining Longevity: 78 mo
Battery Remaining Percentage: 65 %
Battery Voltage: 2.9 V
Brady Statistic RV Percent Paced: 36 %
Date Time Interrogation Session: 20211223095107
Implantable Lead Implant Date: 20040429
Implantable Lead Location: 753860
Implantable Pulse Generator Implant Date: 20110325
Lead Channel Impedance Value: 440 Ohm
Lead Channel Pacing Threshold Amplitude: 1 V
Lead Channel Pacing Threshold Pulse Width: 0.9 ms
Lead Channel Sensing Intrinsic Amplitude: 2.1 mV
Lead Channel Setting Pacing Amplitude: 2.5 V
Lead Channel Setting Pacing Pulse Width: 0.9 ms
Lead Channel Setting Sensing Sensitivity: 0.7 mV
Pulse Gen Model: 1110
Pulse Gen Serial Number: 2313421

## 2020-09-16 DIAGNOSIS — H401132 Primary open-angle glaucoma, bilateral, moderate stage: Secondary | ICD-10-CM | POA: Diagnosis not present

## 2020-09-16 DIAGNOSIS — H353132 Nonexudative age-related macular degeneration, bilateral, intermediate dry stage: Secondary | ICD-10-CM | POA: Diagnosis not present

## 2020-09-16 DIAGNOSIS — Z961 Presence of intraocular lens: Secondary | ICD-10-CM | POA: Diagnosis not present

## 2020-09-16 NOTE — Progress Notes (Signed)
Remote pacemaker transmission.   

## 2020-09-29 ENCOUNTER — Other Ambulatory Visit: Payer: Self-pay | Admitting: Family Medicine

## 2020-09-30 ENCOUNTER — Ambulatory Visit (INDEPENDENT_AMBULATORY_CARE_PROVIDER_SITE_OTHER): Payer: Medicare Other

## 2020-09-30 ENCOUNTER — Other Ambulatory Visit: Payer: Self-pay

## 2020-09-30 DIAGNOSIS — Z7901 Long term (current) use of anticoagulants: Secondary | ICD-10-CM | POA: Diagnosis not present

## 2020-09-30 LAB — POCT INR: INR: 3.5 — AB (ref 2.0–3.0)

## 2020-09-30 NOTE — Patient Instructions (Signed)
Hold today and then decrease 2.5 mg daily except 3.75 mg each Monday.  Repeat INR In 4 weeks

## 2020-10-15 ENCOUNTER — Encounter: Payer: Medicare Other | Admitting: Internal Medicine

## 2020-10-23 ENCOUNTER — Encounter: Payer: Self-pay | Admitting: Family Medicine

## 2020-10-28 ENCOUNTER — Other Ambulatory Visit: Payer: Self-pay

## 2020-10-28 ENCOUNTER — Ambulatory Visit (INDEPENDENT_AMBULATORY_CARE_PROVIDER_SITE_OTHER): Payer: Medicare Other

## 2020-10-28 DIAGNOSIS — Z7901 Long term (current) use of anticoagulants: Secondary | ICD-10-CM | POA: Diagnosis not present

## 2020-10-28 LAB — POCT INR: INR: 2.7 (ref 2.0–3.0)

## 2020-10-28 NOTE — Patient Instructions (Signed)
Continue taking 2.5 mg daily except 3.75 mg each Monday.  Repeat INR In 6 weeks

## 2020-10-29 ENCOUNTER — Encounter: Payer: Self-pay | Admitting: Family Medicine

## 2020-10-29 ENCOUNTER — Ambulatory Visit (INDEPENDENT_AMBULATORY_CARE_PROVIDER_SITE_OTHER): Payer: Medicare Other | Admitting: Family Medicine

## 2020-10-29 VITALS — BP 130/78 | HR 71 | Temp 96.2°F | Wt 186.2 lb

## 2020-10-29 DIAGNOSIS — I700 Atherosclerosis of aorta: Secondary | ICD-10-CM

## 2020-10-29 DIAGNOSIS — K219 Gastro-esophageal reflux disease without esophagitis: Secondary | ICD-10-CM

## 2020-10-29 DIAGNOSIS — M65331 Trigger finger, right middle finger: Secondary | ICD-10-CM

## 2020-10-29 NOTE — Patient Instructions (Signed)
Try either Prilosec or Nexium daily for the next couple weeks and see if that will help with your swallowing.  If it does not then we can pursue that further if we need to. The same thing with the finger when it becomes more of a problem let me know

## 2020-10-29 NOTE — Progress Notes (Signed)
   Subjective:    Patient ID: [name]., male    DOB: 1931-01-06, 87 y.o.   MRN: [id number]  HPI He is here for consult concerning for right third finger and what he describes as a triggering sensation in it.  He states that it is less bothersome than in the past but still has concerns over it.  He also has a several month history of feeling of being choked on occasion.  He cannot tell whether solids or liquids causes and has no real symptoms of acid reflux. Review of his record also indicates aortic atherosclerosis.  Presently he is on a statin drug.   Review of Systems     Objective:   Physical Exam Alert and in no distress.  Exam of his neck shows no masses or tenderness.  Cardiac exam does show an irregular rhythm with a 2/6 murmur. Exam of the right hand does show lack of full extension of the right third finger and some fullness in the MCP joint.       Assessment & Plan:  Trigger middle finger of right hand  Gastroesophageal reflux disease, unspecified whether esophagitis present  Aortic atherosclerosis ([place]) I explained that I think he does have trigger finger and discussed options including surgery versus injection.  At this time he is not interested in pursuing this further unless it gives him more difficulty. I then discussed the GI symptoms that he is having and recommend he try Prilosec regularly for a couple weeks and see if that will help.  If no better, he will call me. I then discussed the aortic atherosclerosis with him and the fact that he is already on a statin drug which would be the therapy of choice.  He understood this.

## 2020-11-03 ENCOUNTER — Other Ambulatory Visit: Payer: Self-pay

## 2020-11-03 ENCOUNTER — Encounter: Payer: Self-pay | Admitting: Internal Medicine

## 2020-11-03 ENCOUNTER — Ambulatory Visit: Payer: Medicare Other | Admitting: Internal Medicine

## 2020-11-03 VITALS — BP 140/86 | HR 70 | Ht 65.0 in | Wt 188.2 lb

## 2020-11-03 DIAGNOSIS — I10 Essential (primary) hypertension: Secondary | ICD-10-CM

## 2020-11-03 DIAGNOSIS — Z950 Presence of cardiac pacemaker: Secondary | ICD-10-CM | POA: Diagnosis not present

## 2020-11-03 DIAGNOSIS — R001 Bradycardia, unspecified: Secondary | ICD-10-CM

## 2020-11-03 DIAGNOSIS — I4821 Permanent atrial fibrillation: Secondary | ICD-10-CM | POA: Diagnosis not present

## 2020-11-03 NOTE — Patient Instructions (Signed)
Medication Instructions:  Your physician recommends that you continue on your current medications as directed. Please refer to the Current Medication list given to you today.  Labwork: None ordered.  Testing/Procedures: None ordered.  Follow-Up: Your physician wants you to follow-up in: one year with [name], MD or one of the following Advanced Practice Providers on your designated Care Team:    Chanetta Marshall, NP  Tommye Standard, PA-C  Legrand Como "[name]" [place], [place]  Remote monitoring is used to monitor your Pacemaker from home. This monitoring reduces the number of office visits required to check your device to one time per year. It allows [place] to keep an eye on the functioning of your device to ensure it is working properly. You are scheduled for a device check from home on 12/02/2020. You may send your transmission at any time that day. If you have a wireless device, the transmission will be sent automatically. After your physician reviews your transmission, you will receive a postcard with your next transmission date.  Any Other Special Instructions Will Be Listed Below (If Applicable).  If you need a refill on your cardiac medications before your next appointment, please call your pharmacy.

## 2020-11-03 NOTE — Progress Notes (Signed)
HPI Mr. Gotay returns today for followup. He is a pleasant 87 yo man with chronic atrial fib, symptomatic bradycardia, s/p PPM insertion and recent episode of sob. He denies chest pain or sob.  Allergies  Allergen Reactions  . Tramadol Itching     Current Outpatient Medications  Medication Sig Dispense Refill  . acetaminophen (TYLENOL) 500 MG tablet Take 500 mg by mouth every 6 (six) hours as needed (for pain).     Marland Kitchen allopurinol (ZYLOPRIM) 100 MG tablet TAKE 1 TABLET BY MOUTH EVERY DAY 90 tablet 3  . carvedilol (COREG) 3.125 MG tablet TAKE 1 TABLET BY MOUTH TWO  TIMES DAILY 180 tablet 3  . Cholecalciferol (VITAMIN D3) 1000 units CAPS Take 1,000 Units by mouth daily.    Marland Kitchen doxazosin (CARDURA) 4 MG tablet TAKE 1 TABLET BY MOUTH AT  BEDTIME 90 tablet 3  . finasteride (PROSCAR) 5 MG tablet TAKE 1 TABLET BY MOUTH EVERY DAY 90 tablet 3  . latanoprost (XALATAN) 0.005 % ophthalmic solution Place 1 drop into both eyes at bedtime.   3  . loperamide (IMODIUM) 2 MG capsule Take 1 capsule (2 mg total) by mouth as needed for diarrhea or loose stools. 30 capsule 0  . Multiple Vitamins-Minerals (PRESERVISION AREDS 2 PO) Take 1 capsule by mouth 2 (two) times daily.     . potassium chloride SA (KLOR-CON) 20 MEQ tablet TAKE ONE-HALF TABLET BY  MOUTH DAILY 45 tablet 3  . simvastatin (ZOCOR) 20 MG tablet TAKE 1 TABLET BY MOUTH AT  BEDTIME 90 tablet 3  . warfarin (COUMADIN) 2.5 MG tablet TAKE 1 TO 1 AND 1/2 TABLETS BY  MOUTH DAILY AS DIRECTED BY THE COUMADIN CLINIC 100 tablet 0   No current facility-administered medications for this visit.   Facility-Administered Medications Ordered in Other Visits  Medication Dose Route Frequency Provider Last Rate Last Admin  . influenza  inactive virus vaccine (FLUZONE/FLUARIX) injection 0.5 mL  0.5 mL Intramuscular Once Rita Ohara, MD         Past Medical History:  Diagnosis Date  . A-fib ([place])   . Arthritis   . CAD (coronary artery disease)    GXT, neg  bruce protocol GXT  . Chronic kidney disease    RENAL INSUFFICIENCY  . Diverticulosis   . ED (erectile dysfunction)   . GERD (gastroesophageal reflux disease)   . Glaucoma   . Gout    no flare in years   . Heart disorder   . Hypertension   . Obesity     ROS:   All systems reviewed and negative except as noted in the HPI.   Past Surgical History:  Procedure Laterality Date  . CATARACT EXTRACTION, BILATERAL  2018   with lens placement   . PACEMAKER INSERTION  01/09/03  . TONSILLECTOMY    . TOTAL KNEE ARTHROPLASTY Right 02/27/2018   Procedure: RIGHT TOTAL KNEE ARTHROPLASTY;  Surgeon: Paralee Cancel, MD;  Location: [facility];  Service: Orthopedics;  Laterality: Right;  70 mins  . TOTAL KNEE ARTHROPLASTY Left 09/06/2018   Procedure: LEFT TOTAL KNEE ARTHROPLASTY;  Surgeon: Paralee Cancel, MD;  Location: [facility];  Service: Orthopedics;  Laterality: Left;  70 mins     Family History  Problem Relation Age of Onset  . Heart disease Mother   . Hypertension Mother   . Kidney disease Mother   . Stroke Mother   . Heart disease Father      Social History   Socioeconomic History  .  Marital status: Widowed    Spouse name: Not on file  . Number of children: 2  . Years of education: S-College  . Highest education level: Not on file  Occupational History    Comment: Works part time  Tobacco Use  . Smoking status: Former Smoker    Packs/day: 1.00    Years: 10.00    Pack years: 10.00    Quit date: 09/13/1979    Years since quitting: 41.1  . Smokeless tobacco: Never Used  Vaping Use  . Vaping Use: Never used  Substance and Sexual Activity  . Alcohol use: Yes    Comment: Consumes 2-3 glasses of wine per week; now only socially   . Drug use: No  . Sexual activity: Not on file  Other Topics Concern  . Not on file  Social History Narrative  . Not on file   Social Determinants of Health   Financial Resource Strain: Not on file  Food Insecurity: Not on file  Transportation Needs:  Not on file  Physical Activity: Not on file  Stress: Not on file  Social Connections: Not on file  Intimate Partner Violence: Not on file     BP 140/86   Pulse 70   Ht 5\' 5"  (1.651 m)   Wt 188 lb 3.2 oz (85.4 kg)   SpO2 98%   BMI 31.32 kg/m   Physical Exam:  Well appearing NAD HEENT: Unremarkable Neck:  No JVD, no thyromegally Lymphatics:  No adenopathy Back:  No CVA tenderness Lungs:  Clear HEART:  Regular rate rhythm, no murmurs, no rubs, no clicks Abd:  soft, positive bowel sounds, no organomegally, no rebound, no guarding Ext:  2 plus pulses, no edema, no cyanosis, no clubbing Skin:  No rashes no nodules Neuro:  CN II through XII intact, motor grossly intact  EKG - Atrial fib with a CVR  DEVICE  Normal device function.  See PaceArt for details.   Assess/Plan: 1. Atrial fib - his VR is well controlled. We will follow.  2. AS - on exam he sounds like he has a fairly tight valve but his 2D echo from a year ago demonstrated a mean gradient of 15. I have recommended he undergo watchful waiting and a repeat echo in 10 months. I instructed him to call our office if he develops any chest pain, sob, or syncope. 3. PPM - his St. Jude single chamber PPM appears to be working normally. We will recheck in several months.  4. HTN - his bp has been high in the doctors office but is usually better at home. We will hold off on BP lowering.  Carleene Overlie Taylor,MD

## 2020-11-12 ENCOUNTER — Telehealth: Payer: Self-pay | Admitting: Family Medicine

## 2020-11-12 NOTE — Telephone Encounter (Signed)
Pt came in and dropped off parking placard. Pt attached a stamped envelope to be mailed to [facility]. Pt can be reached at [phone].

## 2020-11-25 LAB — CUP PACEART INCLINIC DEVICE CHECK
Battery Remaining Longevity: 85 mo
Battery Voltage: 2.9 V
Brady Statistic RV Percent Paced: 38 %
Date Time Interrogation Session: 20220222151500
Implantable Lead Implant Date: 20040429
Implantable Lead Location: 753860
Implantable Pulse Generator Implant Date: 20110325
Lead Channel Impedance Value: 475 Ohm
Lead Channel Pacing Threshold Amplitude: 1 V
Lead Channel Pacing Threshold Pulse Width: 0.9 ms
Lead Channel Sensing Intrinsic Amplitude: 2.7 mV
Lead Channel Setting Pacing Amplitude: 2.5 V
Lead Channel Setting Pacing Pulse Width: 0.9 ms
Lead Channel Setting Sensing Sensitivity: 0.7 mV
Pulse Gen Model: 1110
Pulse Gen Serial Number: 2313421

## 2020-11-30 ENCOUNTER — Other Ambulatory Visit: Payer: Self-pay | Admitting: Internal Medicine

## 2020-12-02 ENCOUNTER — Ambulatory Visit (INDEPENDENT_AMBULATORY_CARE_PROVIDER_SITE_OTHER): Payer: Medicare Other

## 2020-12-02 DIAGNOSIS — I4821 Permanent atrial fibrillation: Secondary | ICD-10-CM | POA: Diagnosis not present

## 2020-12-02 LAB — CUP PACEART REMOTE DEVICE CHECK
Battery Remaining Longevity: 68 mo
Battery Remaining Percentage: 57 %
Battery Voltage: 2.89 V
Brady Statistic RV Percent Paced: 47 %
Date Time Interrogation Session: 20220323115555
Implantable Lead Implant Date: 20040429
Implantable Lead Location: 753860
Implantable Pulse Generator Implant Date: 20110325
Lead Channel Impedance Value: 460 Ohm
Lead Channel Pacing Threshold Amplitude: 1 V
Lead Channel Pacing Threshold Pulse Width: 0.9 ms
Lead Channel Sensing Intrinsic Amplitude: 1.6 mV
Lead Channel Setting Pacing Amplitude: 2.5 V
Lead Channel Setting Pacing Pulse Width: 0.9 ms
Lead Channel Setting Sensing Sensitivity: 0.7 mV
Pulse Gen Model: 1110
Pulse Gen Serial Number: 2313421

## 2020-12-10 ENCOUNTER — Other Ambulatory Visit: Payer: Self-pay

## 2020-12-10 ENCOUNTER — Ambulatory Visit (INDEPENDENT_AMBULATORY_CARE_PROVIDER_SITE_OTHER): Payer: Medicare Other | Admitting: *Deleted

## 2020-12-10 DIAGNOSIS — Z7901 Long term (current) use of anticoagulants: Secondary | ICD-10-CM | POA: Diagnosis not present

## 2020-12-10 LAB — POCT INR: INR: 2.5 (ref 2.0–3.0)

## 2020-12-10 NOTE — Progress Notes (Signed)
Remote pacemaker transmission.   

## 2020-12-10 NOTE — Patient Instructions (Signed)
Description   Continue taking 2.5 mg daily except 3.75 mg each Monday.  Repeat INR In 6 weeks

## 2020-12-31 ENCOUNTER — Encounter: Payer: Self-pay | Admitting: Family Medicine

## 2020-12-31 ENCOUNTER — Other Ambulatory Visit: Payer: Self-pay

## 2020-12-31 ENCOUNTER — Ambulatory Visit (INDEPENDENT_AMBULATORY_CARE_PROVIDER_SITE_OTHER): Payer: Medicare Other | Admitting: Family Medicine

## 2020-12-31 VITALS — BP 166/76 | HR 75 | Temp 97.7°F | Ht 64.75 in | Wt 189.8 lb

## 2020-12-31 DIAGNOSIS — M1990 Unspecified osteoarthritis, unspecified site: Secondary | ICD-10-CM | POA: Diagnosis not present

## 2020-12-31 DIAGNOSIS — R350 Frequency of micturition: Secondary | ICD-10-CM

## 2020-12-31 DIAGNOSIS — Z96653 Presence of artificial knee joint, bilateral: Secondary | ICD-10-CM

## 2020-12-31 DIAGNOSIS — Z950 Presence of cardiac pacemaker: Secondary | ICD-10-CM

## 2020-12-31 DIAGNOSIS — I700 Atherosclerosis of aorta: Secondary | ICD-10-CM | POA: Diagnosis not present

## 2020-12-31 DIAGNOSIS — I2510 Atherosclerotic heart disease of native coronary artery without angina pectoris: Secondary | ICD-10-CM

## 2020-12-31 DIAGNOSIS — Z0000 Encounter for general adult medical examination without abnormal findings: Secondary | ICD-10-CM

## 2020-12-31 DIAGNOSIS — N1831 Chronic kidney disease, stage 3a: Secondary | ICD-10-CM | POA: Diagnosis not present

## 2020-12-31 DIAGNOSIS — H409 Unspecified glaucoma: Secondary | ICD-10-CM | POA: Diagnosis not present

## 2020-12-31 DIAGNOSIS — K219 Gastro-esophageal reflux disease without esophagitis: Secondary | ICD-10-CM

## 2020-12-31 DIAGNOSIS — I10 Essential (primary) hypertension: Secondary | ICD-10-CM | POA: Diagnosis not present

## 2020-12-31 DIAGNOSIS — E785 Hyperlipidemia, unspecified: Secondary | ICD-10-CM

## 2020-12-31 DIAGNOSIS — Z6835 Body mass index (BMI) 35.0-35.9, adult: Secondary | ICD-10-CM

## 2020-12-31 DIAGNOSIS — Z23 Encounter for immunization: Secondary | ICD-10-CM

## 2020-12-31 DIAGNOSIS — M109 Gout, unspecified: Secondary | ICD-10-CM

## 2020-12-31 DIAGNOSIS — N401 Enlarged prostate with lower urinary tract symptoms: Secondary | ICD-10-CM

## 2020-12-31 DIAGNOSIS — Z7901 Long term (current) use of anticoagulants: Secondary | ICD-10-CM

## 2020-12-31 NOTE — Patient Instructions (Signed)
  [name] , Thank you for taking time to come for your Medicare Wellness Visit. I appreciate your ongoing commitment to your health goals. Please review the following plan we discussed and let me know if I can assist you in the future.   These are the goals we discussed: Start exercising more.  Check out going to Silver sneakers. Try Axid or Pepcid to help with your indigestion symptoms. This is a list of the screening recommended for you and due dates:  Health Maintenance  Topic Date Due  . Flu Shot  04/12/2021  . Tetanus Vaccine  05/17/2027  . COVID-19 Vaccine  Completed  . Pneumonia vaccines  Completed  . HPV Vaccine  Aged Out

## 2020-12-31 NOTE — Progress Notes (Signed)
[name]. is a 87 y.o. male who presents for annual wellness visit,CPE and follow-up on chronic medical conditions.  He has no particular concerns or complaints.  He does have a pacer and checks in regularly via the Internet.  He is scheduled to see cardiology in the near future.  He continues on Coumadin.  He sees his ophthalmologist regularly for his glaucoma.  Continues on his statin as well as Cardura and Coreg.  He continues on finasteride but still having some urinary frequency.  He has had TKR bilaterally and does admit to not exercising regularly.  He is having some reflux symptoms but has not taken anything for it.  He lives with his daughter.   Immunizations and Health Maintenance Immunization History  Administered Date(s) Administered  . Fluad Quad(high Dose 65+) 05/28/2019  . Influenza Split 07/26/2011, 06/25/2012  . Influenza Whole 08/08/2007, 06/11/2008, 06/01/2010  . Influenza, High Dose Seasonal PF 06/18/2013, 06/06/2014, 05/19/2015, 05/31/2016, 05/16/2017, 06/08/2018, 06/24/2020  . PFIZER(Purple Top)SARS-COV-2 Vaccination 10/07/2019, 10/28/2019, 06/22/2020  . Pneumococcal Conjugate-13 06/25/2015  . Pneumococcal Polysaccharide-23 01/03/1998, 06/11/2008  . Tdap 09/10/2008, 05/16/2017   There are no preventive care reminders to display for this patient.  Last colonoscopy:11/18/08 Last PSA: N/A Dentist: Q year Ophtho:Q year Exercise: N/A   Other doctors caring for patient include: Dr.Taylor cardiology  Advanced Directives: Does Patient Have a Medical Advance Directive?: Yes Type of Advance Directive: Living will Does patient want to make changes to medical advance directive?: No - Patient declined Copy asked for Depression screen:  See questionnaire below.     Depression screen [facility] 2/9 12/31/2020 04/20/2020 03/21/2019 01/12/2017 06/16/2015  Decreased Interest 0 0 0 0 0  Down, Depressed, Hopeless 0 0 0 0 0  PHQ - 2 Score 0 0 0 0 0  Some recent data might be hidden     Fall Screen: See Questionaire below.   Fall Risk  12/31/2020 04/20/2020 03/21/2019 01/26/2018 01/12/2017  Falls in the past year? 0 0 1 No No  Number falls in past yr: 0 - - - -  Injury with Fall? 0 - 0 - -  Risk for fall due to : No Fall Risks Impaired balance/gait - - -  Follow up Falls evaluation completed - - - -    ADL screen:  See questionnaire below.  Functional Status Survey: Is the patient deaf or have difficulty hearing?: No Does the patient have difficulty seeing, even when wearing glasses/contacts?: No Does the patient have difficulty concentrating, remembering, or making decisions?: No Does the patient have difficulty walking or climbing stairs?: Yes Does the patient have difficulty dressing or bathing?: No Does the patient have difficulty doing errands alone such as visiting a doctor's office or shopping?: No (duaghter helps)   Review of Systems  Constitutional: -, -unexpected weight change, -anorexia, -fatigue Allergy: -sneezing, -itching, -congestion Dermatology: denies changing moles, rash, lumps ENT: -runny nose, -ear pain, -sore throat,  Cardiology:  -chest pain, -palpitations, -orthopnea, Respiratory: -cough, -shortness of breath, -dyspnea on exertion, -wheezing,  Gastroenterology: -abdominal pain, -nausea, -vomiting, -diarrhea, -constipation, -dysphagia Hematology: -bleeding or bruising problems Musculoskeletal: -arthralgias, -myalgias, -joint swelling, -back pain, - Ophthalmology: -vision changes,  Urology: -dysuria, -difficulty urinating,  -urinary frequency, -urgency, incontinence Neurology: -, -numbness, , -memory loss, -falls, -dizziness    PHYSICAL EXAM:  General Appearance: Alert, cooperative, no distress, appears stated age Head: Normocephalic, without obvious abnormality, atraumatic Eyes: PERRL, conjunctiva/corneas clear, EOM's intact. Ears: Normal TM's and external ear canals Nose: Nares normal, mucosa normal,  no drainage or sinus    tenderness Throat: Lips, mucosa, and tongue normal; teeth and gums normal Neck: Supple, no lymphadenopathy, thyroid:no enlargement/tenderness/nodules; no carotid bruit or JVD Lungs: Clear to auscultation bilaterally without wheezes, rales or ronchi; respirations unlabored Heart: Regular rate and rhythm, S1 and S2 normal, no murmur, rub or gallop Abdomen: Soft, non-tender, nondistended, normoactive bowel sounds, no masses, no hepatosplenomegaly Extremities: No clubbing, cyanosis or edema Pulses: 2+ and symmetric all extremities Skin: Skin color, texture, turgor normal, no rashes or lesions Lymph nodes: Cervical, supraclavicular, and axillary nodes normal Neurologic: CNII-XII intact, normal strength, sensation and gait; reflexes 2+ and symmetric throughout   Psych: Normal mood, affect, hygiene and grooming  ASSESSMENT/PLAN: Routine general medical examination at a health care facility - Plan: CBC with Differential/Platelet, Comprehensive metabolic panel, Lipid panel  PACEMAKER-[employer]  Glaucoma of both eyes, unspecified glaucoma type  Hyperlipidemia LDL goal <70 - Plan: Lipid panel  Primary hypertension - Plan: CBC with Differential/Platelet, Comprehensive metabolic panel  Long term (current) use of anticoagulants  Arthritis  Stage 3a chronic kidney disease (HCC)  Aortic atherosclerosis ([place]) - Plan: Uric Acid  Gout involving toe, unspecified cause, unspecified chronicity, unspecified laterality  Class 2 severe obesity due to excess calories with serious comorbidity and body mass index (BMI) of 35.0 to 35.9 in adult (HCC)  Immunization, viral disease - Plan: PFIZER Comirnaty(GRAY TOP)COVID-19 Vaccine  Status post total bilateral knee replacement Recommend he try Axid or Pepcid for his reflux symptoms and let me know.  Also strongly encouraged him to get more physically active including [facility] and using his [employer] sne[employer] membership.     recommended at least 30  minutes of aerobic activity at least 5 days/week;  Immunization recommendations discussed.  He is to set up getting the Shingrix vaccine. Medicare Attestation I have personally reviewed: The patient's medical and social history Their use of alcohol, tobacco or illicit drugs Their current medications and supplements The patient's functional ability including ADLs,fall risks, home safety risks, cognitive, and hearing and visual impairment Diet and physical activities Evidence for depression or mood disorders  The patient's weight, height, and BMI have been recorded in the chart.  I have made referrals, counseling, and provided education to the patient based on review of the above and I have provided the patient with a written personalized care plan for preventive services.     Jill Alexanders, MD   12/31/2020

## 2021-01-01 LAB — COMPREHENSIVE METABOLIC PANEL
ALT: 13 IU/L (ref 0–44)
AST: 21 IU/L (ref 0–40)
Albumin/Globulin Ratio: 2 (ref 1.2–2.2)
Albumin: 4.3 g/dL (ref 3.6–4.6)
Alkaline Phosphatase: 96 IU/L (ref 44–121)
BUN/Creatinine Ratio: 14 (ref 10–24)
BUN: 21 mg/dL (ref 8–27)
Bilirubin Total: 0.9 mg/dL (ref 0.0–1.2)
CO2: 23 mmol/L (ref 20–29)
Calcium: 9.4 mg/dL (ref 8.6–10.2)
Chloride: 103 mmol/L (ref 96–106)
Creatinine, Ser: 1.48 mg/dL — ABNORMAL HIGH (ref 0.76–1.27)
Globulin, Total: 2.2 g/dL (ref 1.5–4.5)
Glucose: 98 mg/dL (ref 65–99)
Potassium: 4.9 mmol/L (ref 3.5–5.2)
Sodium: 143 mmol/L (ref 134–144)
Total Protein: 6.5 g/dL (ref 6.0–8.5)
eGFR: 46 mL/min/{1.73_m2} — ABNORMAL LOW (ref >59)

## 2021-01-01 LAB — CBC WITH DIFFERENTIAL/PLATELET
Basophils Absolute: 0.1 10*3/uL (ref 0.0–0.2)
Basos: 1 %
EOS (ABSOLUTE): 0.4 10*3/uL (ref 0.0–0.4)
Eos: 6 %
Hematocrit: 41.8 % (ref 37.5–51.0)
Hemoglobin: 13.5 g/dL (ref 13.0–17.7)
Immature Grans (Abs): 0 10*3/uL (ref 0.0–0.1)
Immature Granulocytes: 0 %
Lymphocytes Absolute: 1 10*3/uL (ref 0.7–3.1)
Lymphs: 17 %
MCH: 29.5 pg (ref 26.6–33.0)
MCHC: 32.3 g/dL (ref 31.5–35.7)
MCV: 91 fL (ref 79–97)
Monocytes Absolute: 0.5 10*3/uL (ref 0.1–0.9)
Monocytes: 8 %
Neutrophils Absolute: 3.8 10*3/uL (ref 1.4–7.0)
Neutrophils: 68 %
Platelets: 134 10*3/uL — ABNORMAL LOW (ref 150–450)
RBC: 4.58 x10E6/uL (ref 4.14–5.80)
RDW: 14.1 % (ref 11.6–15.4)
WBC: 5.7 10*3/uL (ref 3.4–10.8)

## 2021-01-01 LAB — LIPID PANEL
Chol/HDL Ratio: 3.5 ratio (ref 0.0–5.0)
Cholesterol, Total: 118 mg/dL (ref 100–199)
HDL: 34 mg/dL — ABNORMAL LOW (ref >39)
LDL Chol Calc (NIH): 61 mg/dL (ref 0–99)
Triglycerides: 125 mg/dL (ref 0–149)
VLDL Cholesterol Cal: 23 mg/dL (ref 5–40)

## 2021-01-01 LAB — URIC ACID: Uric Acid: 4.1 mg/dL (ref 3.8–8.4)

## 2021-01-21 ENCOUNTER — Other Ambulatory Visit: Payer: Self-pay

## 2021-01-21 ENCOUNTER — Ambulatory Visit (INDEPENDENT_AMBULATORY_CARE_PROVIDER_SITE_OTHER): Payer: Medicare Other | Admitting: Pharmacist

## 2021-01-21 DIAGNOSIS — Z7901 Long term (current) use of anticoagulants: Secondary | ICD-10-CM | POA: Diagnosis not present

## 2021-01-21 DIAGNOSIS — I4821 Permanent atrial fibrillation: Secondary | ICD-10-CM | POA: Diagnosis not present

## 2021-01-21 LAB — POCT INR: INR: 2.6 (ref 2.0–3.0)

## 2021-01-28 DIAGNOSIS — Z85828 Personal history of other malignant neoplasm of skin: Secondary | ICD-10-CM | POA: Diagnosis not present

## 2021-01-28 DIAGNOSIS — D3612 Benign neoplasm of peripheral nerves and autonomic nervous system, upper limb, including shoulder: Secondary | ICD-10-CM | POA: Diagnosis not present

## 2021-01-28 DIAGNOSIS — L821 Other seborrheic keratosis: Secondary | ICD-10-CM | POA: Diagnosis not present

## 2021-01-28 DIAGNOSIS — L570 Actinic keratosis: Secondary | ICD-10-CM | POA: Diagnosis not present

## 2021-01-28 DIAGNOSIS — D3611 Benign neoplasm of peripheral nerves and autonomic nervous system of face, head, and neck: Secondary | ICD-10-CM | POA: Diagnosis not present

## 2021-01-28 DIAGNOSIS — D1801 Hemangioma of skin and subcutaneous tissue: Secondary | ICD-10-CM | POA: Diagnosis not present

## 2021-01-30 IMAGING — US US RENAL
1 series · 14 of 25 positions shown · non-contrast
Comparison: None.

CLINICAL DATA: Acute renal failure

EXAM:
RENAL / URINARY TRACT ULTRASOUND COMPLETE

[Series 1: us renal · 14 of 34 slices shown]
[im 1/34]
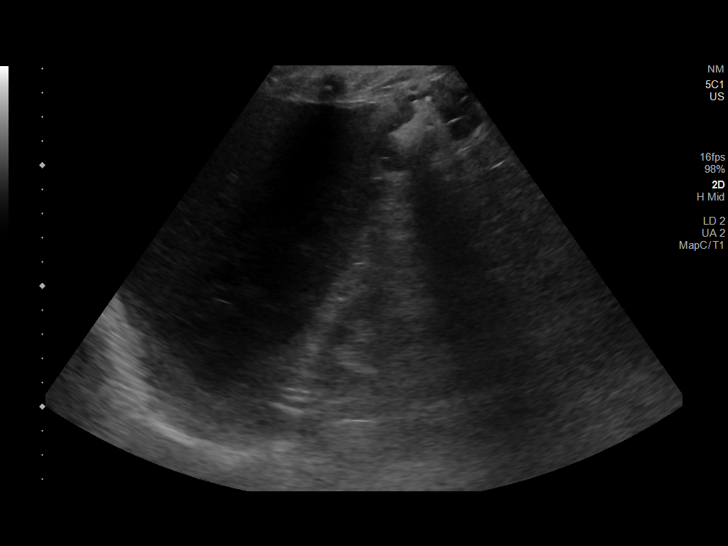
[im 3/34]
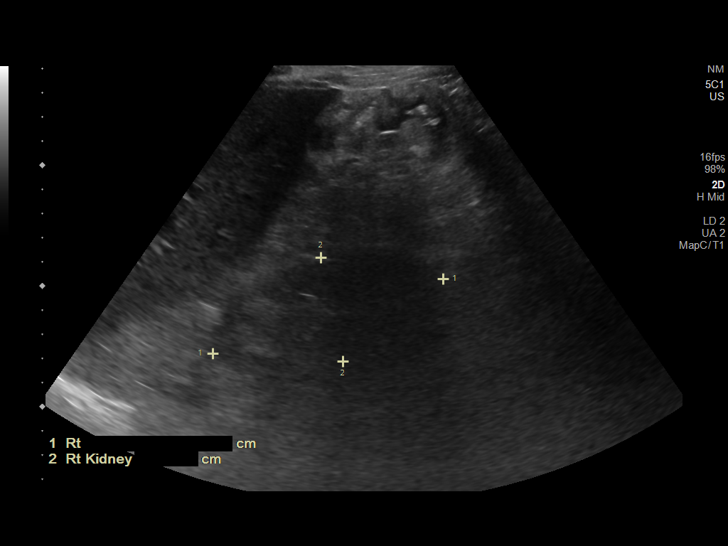
[im 6/34]
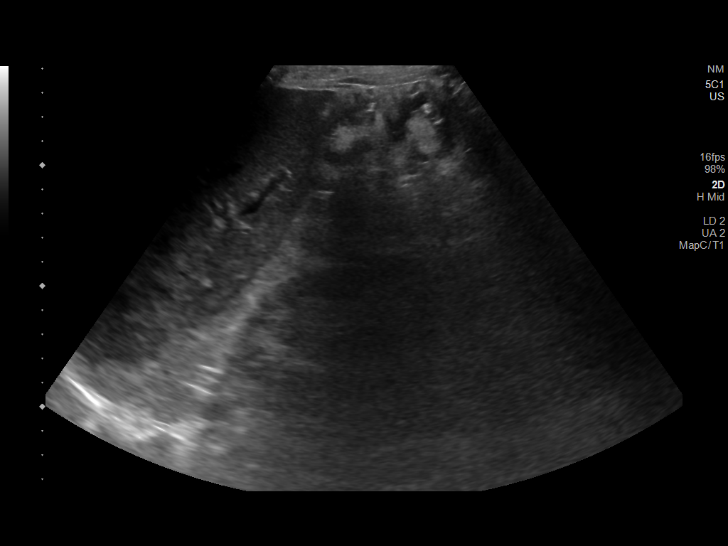
[im 9/34]
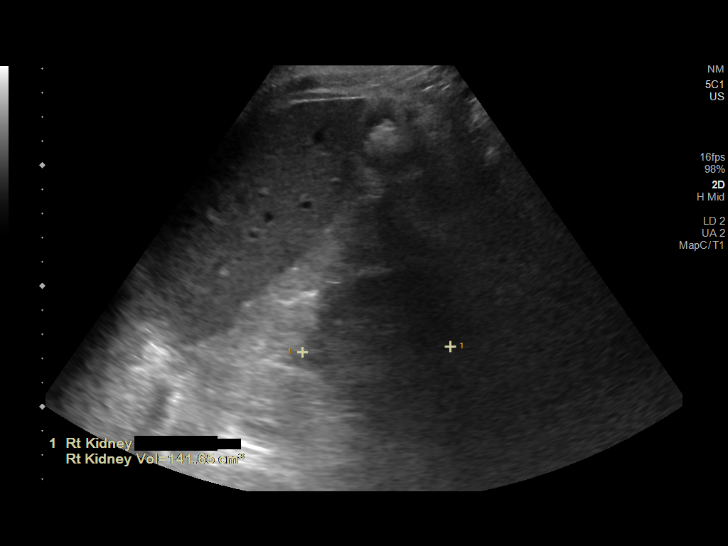
[im 12/34]
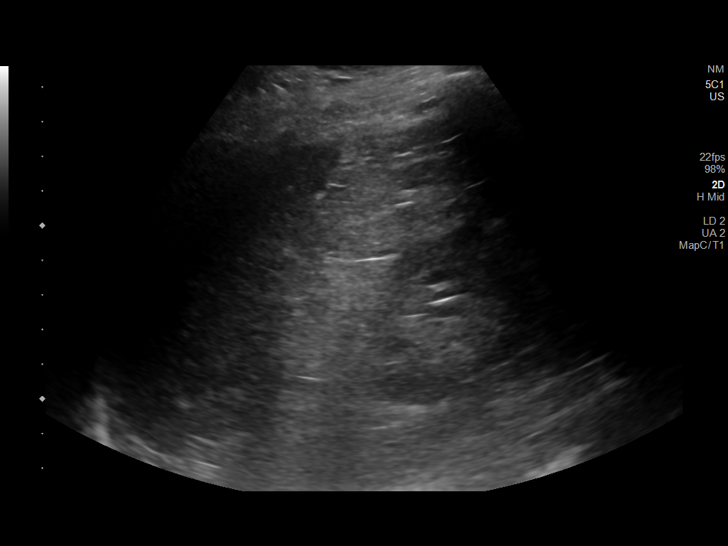
[im 13/34]
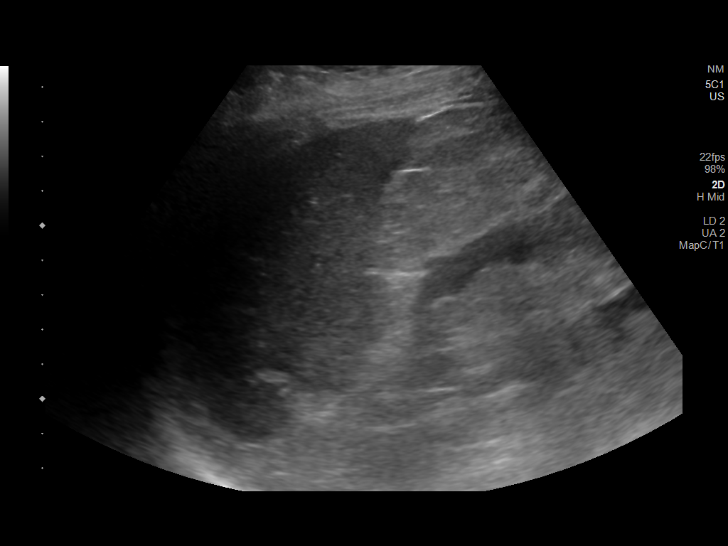
[im 16/34]
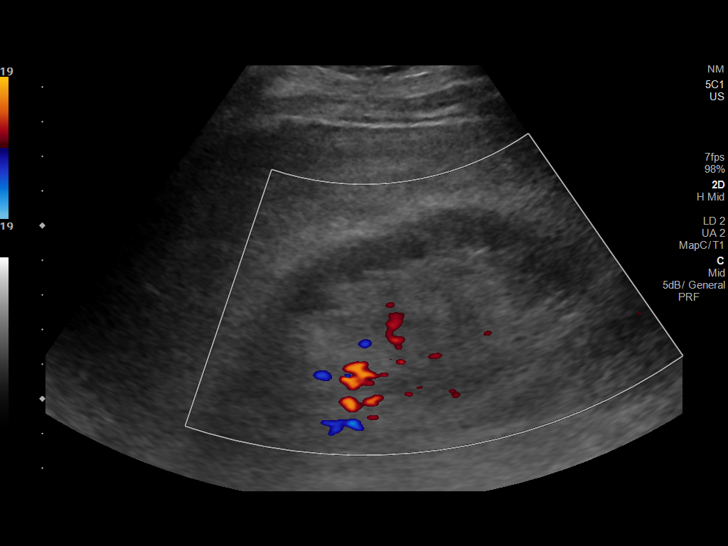
[im 18/34]
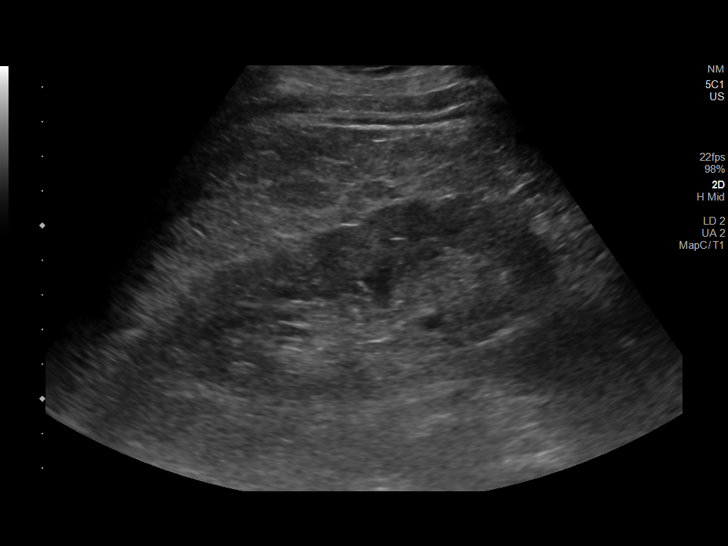
[im 21/34]
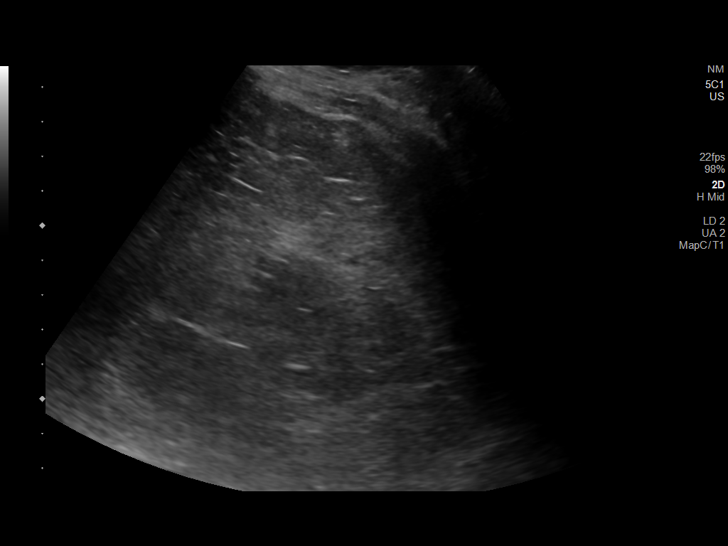
[im 23/34]
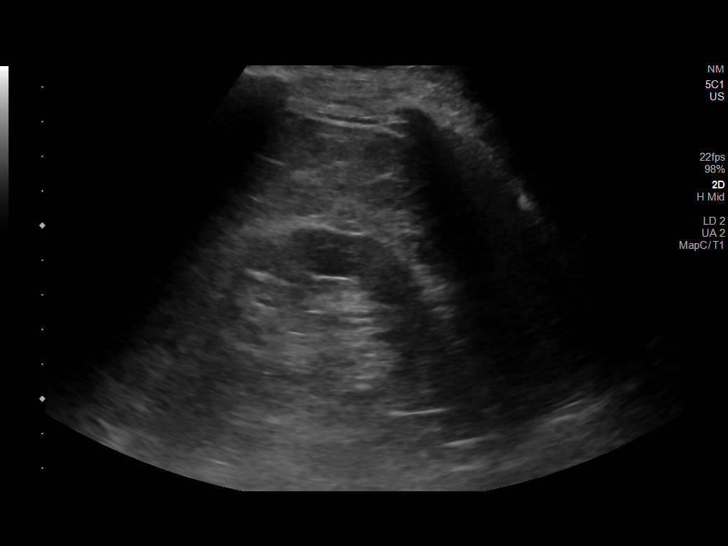
[im 25/34]
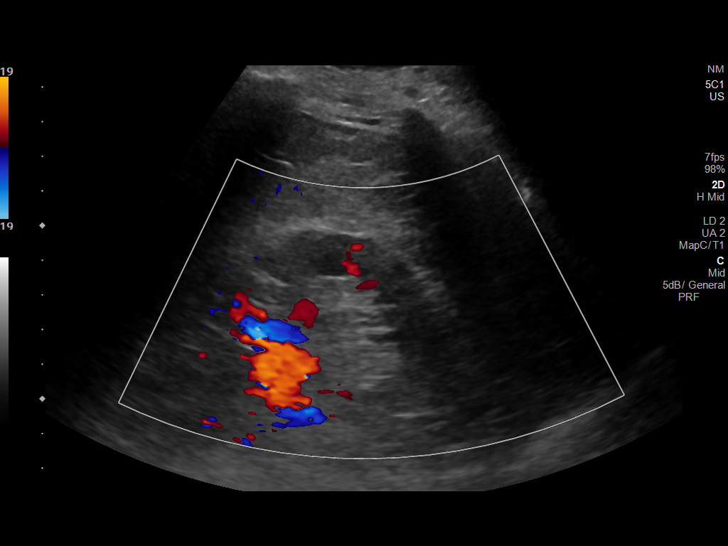
[im 28/34]
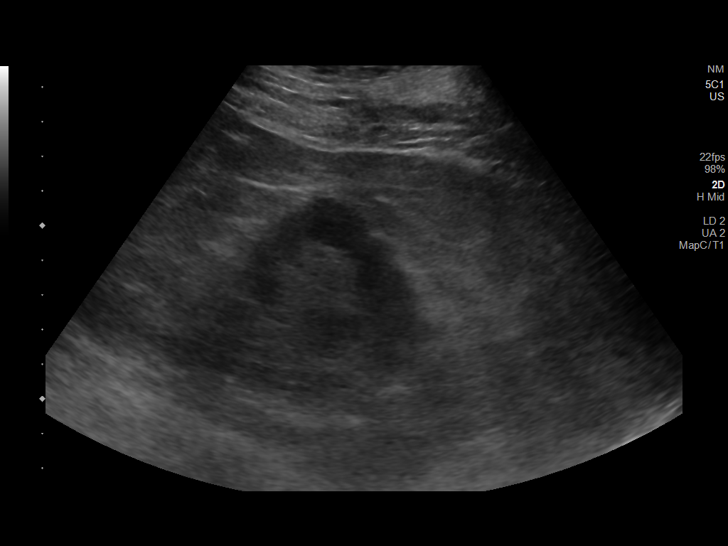
[im 31/34]
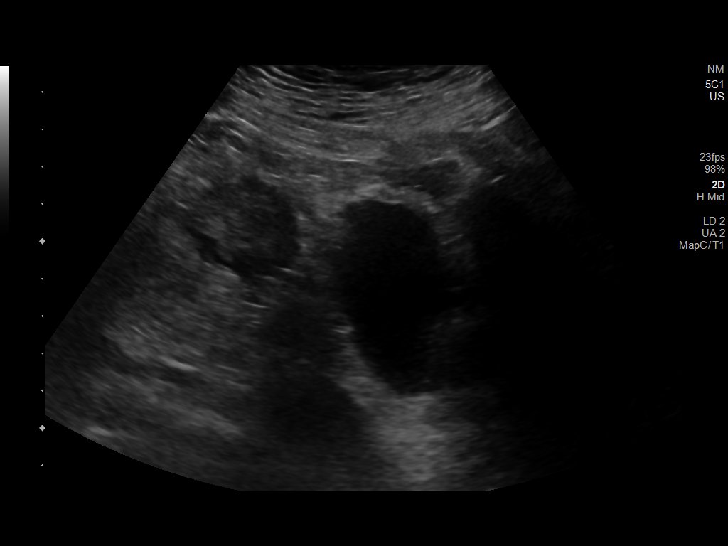
[im 34/34]
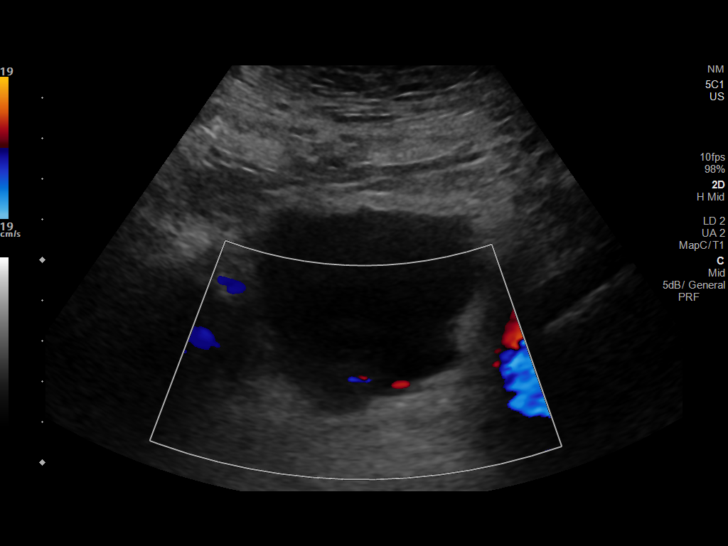

[14 of 25 positions shown; findings below may reference images not displayed]

FINDINGS: Right Kidney:

Limited visualization due to shadowing colonic gas. Renal
measurements: 10 x 4 x 6 cm = volume: 140 mL. Cortical thinning to
11 mm. Limited assessment of echogenicity. No mass or hydronephrosis
visualized.

Left Kidney:

Renal measurements: 10 x 4 x 5 cm = volume: 110 mL. Cortical
thinning to 11 mm. Mildly increased echogenicity. No solid mass or
hydronephrosis visualized.

Bladder:

Appears normal for degree of bladder distention.
IMPRESSION: 1. Medical renal disease with bilateral cortical thinning.
2. No hydronephrosis.

## 2021-02-22 ENCOUNTER — Other Ambulatory Visit: Payer: Self-pay

## 2021-02-22 MED ORDER — SIMVASTATIN 20 MG PO TABS: 20.0000 mg | ORAL_TABLET | Freq: Every day | ORAL | 1 refills | Status: DC

## 2021-03-03 ENCOUNTER — Ambulatory Visit (INDEPENDENT_AMBULATORY_CARE_PROVIDER_SITE_OTHER): Payer: Medicare Other

## 2021-03-03 ENCOUNTER — Other Ambulatory Visit: Payer: Self-pay

## 2021-03-03 DIAGNOSIS — Z7901 Long term (current) use of anticoagulants: Secondary | ICD-10-CM | POA: Diagnosis not present

## 2021-03-03 DIAGNOSIS — I4821 Permanent atrial fibrillation: Secondary | ICD-10-CM

## 2021-03-03 LAB — CUP PACEART REMOTE DEVICE CHECK
Battery Remaining Longevity: 19 mo
Battery Remaining Percentage: 17 %
Battery Voltage: 2.9 V
Brady Statistic RV Percent Paced: 40 %
Date Time Interrogation Session: 20220622114737
Implantable Lead Implant Date: 20040429
Implantable Lead Location: 753860
Implantable Pulse Generator Implant Date: 20110325
Lead Channel Impedance Value: 440 Ohm
Lead Channel Pacing Threshold Amplitude: 1 V
Lead Channel Pacing Threshold Pulse Width: 0.9 ms
Lead Channel Sensing Intrinsic Amplitude: 1.3 mV
Lead Channel Setting Pacing Amplitude: 2.5 V
Lead Channel Setting Pacing Pulse Width: 0.9 ms
Lead Channel Setting Sensing Sensitivity: 0.7 mV
Pulse Gen Model: 1110
Pulse Gen Serial Number: 2313421

## 2021-03-03 LAB — POCT INR: INR: 3.2 — AB (ref 2.0–3.0)

## 2021-03-03 NOTE — Patient Instructions (Signed)
Continue taking 2.5 mg daily except 3.75 mg each Monday.  Repeat INR In 6 weeks. Eat greens tonight

## 2021-03-23 NOTE — Progress Notes (Signed)
Remote pacemaker transmission.   

## 2021-03-24 ENCOUNTER — Other Ambulatory Visit: Payer: Self-pay | Admitting: Family Medicine

## 2021-03-24 DIAGNOSIS — N401 Enlarged prostate with lower urinary tract symptoms: Secondary | ICD-10-CM

## 2021-03-31 DIAGNOSIS — H353132 Nonexudative age-related macular degeneration, bilateral, intermediate dry stage: Secondary | ICD-10-CM | POA: Diagnosis not present

## 2021-03-31 DIAGNOSIS — H52203 Unspecified astigmatism, bilateral: Secondary | ICD-10-CM | POA: Diagnosis not present

## 2021-03-31 DIAGNOSIS — Z961 Presence of intraocular lens: Secondary | ICD-10-CM | POA: Diagnosis not present

## 2021-03-31 DIAGNOSIS — H401131 Primary open-angle glaucoma, bilateral, mild stage: Secondary | ICD-10-CM | POA: Diagnosis not present

## 2021-04-14 ENCOUNTER — Ambulatory Visit (INDEPENDENT_AMBULATORY_CARE_PROVIDER_SITE_OTHER): Payer: Medicare Other

## 2021-04-14 ENCOUNTER — Other Ambulatory Visit: Payer: Self-pay

## 2021-04-14 DIAGNOSIS — Z7901 Long term (current) use of anticoagulants: Secondary | ICD-10-CM | POA: Diagnosis not present

## 2021-04-14 LAB — POCT INR: INR: 2.6 (ref 2.0–3.0)

## 2021-04-14 NOTE — Patient Instructions (Signed)
Continue taking 2.5 mg daily except 3.75 mg each Monday.  Repeat INR In 6 weeks

## 2021-05-26 ENCOUNTER — Other Ambulatory Visit: Payer: Self-pay

## 2021-05-26 ENCOUNTER — Ambulatory Visit (INDEPENDENT_AMBULATORY_CARE_PROVIDER_SITE_OTHER): Payer: Medicare Other

## 2021-05-26 DIAGNOSIS — Z7901 Long term (current) use of anticoagulants: Secondary | ICD-10-CM

## 2021-05-26 LAB — POCT INR: INR: 2 (ref 2.0–3.0)

## 2021-05-26 NOTE — Patient Instructions (Signed)
Continue taking 2.5 mg daily except 3.75 mg each Monday.  Repeat INR In 6 weeks

## 2021-05-29 ENCOUNTER — Other Ambulatory Visit: Payer: Self-pay | Admitting: Family Medicine

## 2021-06-02 ENCOUNTER — Ambulatory Visit (INDEPENDENT_AMBULATORY_CARE_PROVIDER_SITE_OTHER): Payer: Medicare Other

## 2021-06-02 ENCOUNTER — Telehealth: Payer: Self-pay | Admitting: Family Medicine

## 2021-06-02 DIAGNOSIS — I4821 Permanent atrial fibrillation: Secondary | ICD-10-CM | POA: Diagnosis not present

## 2021-06-02 LAB — CUP PACEART REMOTE DEVICE CHECK
Battery Remaining Longevity: 18 mo
Battery Remaining Percentage: 15 %
Battery Voltage: 2.87 V
Brady Statistic RV Percent Paced: 44 %
Date Time Interrogation Session: 20220921100313
Implantable Lead Implant Date: 20040429
Implantable Lead Location: 753860
Implantable Pulse Generator Implant Date: 20110325
Lead Channel Impedance Value: 480 Ohm
Lead Channel Pacing Threshold Amplitude: 1 V
Lead Channel Pacing Threshold Pulse Width: 0.9 ms
Lead Channel Sensing Intrinsic Amplitude: 1.8 mV
Lead Channel Setting Pacing Amplitude: 2.5 V
Lead Channel Setting Pacing Pulse Width: 0.9 ms
Lead Channel Setting Sensing Sensitivity: 0.7 mV
Pulse Gen Model: 1110
Pulse Gen Serial Number: 2313421

## 2021-06-02 NOTE — Progress Notes (Signed)
  Chronic Care Management   Note  06/02/2021 Name: [name]. MRN: [id number] DOB: 13-Jun-1931  [name]. is a 87 y.o. year old male who is a primary care patient of [name], MD. I reached out to [employer]. by phone today in response to a referral sent by [name]'s PCP, [name], MD.   [name] was given information about Chronic Care Management services today including:  CCM service includes personalized support from designated clinical staff supervised by his physician, including individualized plan of care and coordination with other care providers 24/7 contact phone numbers for assistance for urgent and routine care needs. Service will only be billed when office clinical staff spend 20 minutes or more in a month to coordinate care. Only one practitioner may furnish and bill the service in a calendar month. The patient may stop CCM services at any time (effective at the end of the month) by phone call to the office staff.   Patient agreed to services and verbal consent obtained.   Follow up plan:   Tatjana Secretary/administrator

## 2021-06-02 NOTE — Chronic Care Management (AMB) (Signed)
  Chronic Care Management   Outreach Note  06/02/2021 Name: [name]. MRN: [id number] DOB: 06-Aug-1931  Referred by: [name], MD Reason for referral : No chief complaint on file.   An unsuccessful telephone outreach was attempted today. The patient was referred to the pharmacist for assistance with care management and care coordination.   Follow Up Plan:   Tatjana Dellinger Upstream Scheduler

## 2021-06-08 NOTE — Progress Notes (Signed)
Remote pacemaker transmission.   

## 2021-06-25 ENCOUNTER — Telehealth: Payer: Self-pay | Admitting: Pharmacist

## 2021-06-25 NOTE — Chronic Care Management (AMB) (Signed)
Chronic Care Management Pharmacy Assistant   Name: [name].  MRN: [id number] DOB: 09-19-1930  [name]. is an 87 y.o. year old male who presents for his initial CCM visit with the clinical pharmacist.  Reason for Encounter: Chart Prep for initial visit with Christoper Fabian, D on 07/01/21.   Conditions to be addressed/monitored: CAD, HTN, HLD, CKD Stage 3, and GERD, Permanent A-FIB, Aortic Atherosclerosis, Arthritis, Diverticulosis, Glaucoma and Obesity.   Recent office visits:  12/31/20 Jill Alexanders MD (PCP) - seen for routine general medical examination/ Medicare annual wellness exam at a health care facility and other chronic conditions. No medication changes. Follow up in 1 year or sooner if needed.   Recent consult visits:  05/26/21 Frederik Schmidt RN (Cardiology) - seen for anticoagulation office visit PT/INR check.  01/28/21 Jarome Matin MD (Dermatology) - seen for actinic keratosis and other skin related issues. Skin biopsies performed in office. No medication changes or follow up noted.   Hospital visits:  None in previous 6 months  Medications: Outpatient Encounter Medications as of 06/25/2021  Medication Sig   acetaminophen (TYLENOL) 500 MG tablet Take 500 mg by mouth every 6 (six) hours as needed (for pain).    allopurinol (ZYLOPRIM) 100 MG tablet TAKE 1 TABLET BY MOUTH EVERY DAY   carvedilol (COREG) 3.125 MG tablet TAKE 1 TABLET BY MOUTH  TWICE DAILY   Cholecalciferol (VITAMIN D3) 1000 units CAPS Take 1,000 Units by mouth daily.   doxazosin (CARDURA) 4 MG tablet TAKE 1 TABLET BY MOUTH AT  BEDTIME   finasteride (PROSCAR) 5 MG tablet TAKE 1 TABLET BY MOUTH EVERY DAY   latanoprost (XALATAN) 0.005 % ophthalmic solution Place 1 drop into both eyes at bedtime.    loperamide (IMODIUM) 2 MG capsule Take 1 capsule (2 mg total) by mouth as needed for diarrhea or loose stools.   Multiple Vitamins-Minerals (PRESERVISION AREDS 2 PO) Take 1 capsule by mouth 2  (two) times daily.    potassium chloride SA (KLOR-CON) 20 MEQ tablet TAKE ONE-HALF TABLET BY  MOUTH DAILY   simvastatin (ZOCOR) 20 MG tablet Take 1 tablet (20 mg total) by mouth at bedtime.   warfarin (COUMADIN) 2.5 MG tablet TAKE 1 TO 1 AND 1/2 TABLETS BY MOUTH DAILY AS DIRECTED  BY THE COUMADIN CLINIC   Facility-Administered Encounter Medications as of 06/25/2021  Medication   influenza  inactive virus vaccine (FLUZONE/FLUARIX) injection 0.5 mL   Fill History: ALLOPURINOL 100 MG TABLET 03/24/2021 90   CARVEDILOL  3.125 MG TABS 12/20/2020 90   DOXAZOSIN MESYLATE  4 MG TABS 03/28/2021 90   FINASTERIDE 5 MG TABLET 03/24/2021 90   LATANOPROST  0.005 % SOLN 03/28/2021 90   POTASSIUM CHLORIDE ER  20 MEQ TBCR 03/28/2021 90   SIMVASTATIN  20 MG TABS 03/28/2021 90   WARFARIN SODIUM  2.5 MG TABS 12/20/2020 60   Initial Questions: Have you seen any other providers since your last visit? Nothing besides his coumadin clinic visits.   Any changes in your medications or health? No changes.   Any side effects from any medications? Not that he knows of.   Do you have any symptoms or problems not managed by your medications? Yes. Patient feels the finasteride is not really helping.   Any concerns about your health right now? Nothing besides getting older.  Has your provider asked that you check blood pressure, blood sugar, or follow special diet at home? No.   Do you  get any type of exercise on a regular basis? Not much due to using a walker.   Can you think of a goal you would like to reach for your health? Patient would like to maintain his current health and be able to do more with better balance.   Do you have any problems getting your medications? Not at this time.   Is there anything that you would like to discuss during the appointment? Nothing besides the finasteride efficacy.   Please bring medications and supplements to appointment.   Care Gaps:  AWV - completed on  12/31/20 Zoster vaccines - never done Covid-19 vaccine booster 5 - overdue  Star Rating Drugs:  Simvastatin 20mg  - last filled on 03/28/21 90DS at [facility] Pharmacist Assistant [phone]

## 2021-06-30 ENCOUNTER — Telehealth: Payer: Self-pay | Admitting: Pharmacist

## 2021-06-30 NOTE — Chronic Care Management (AMB) (Signed)
    Chronic Care Management Pharmacy Assistant   Name: [name].  MRN: [id number] DOB: [birth year]  07/01/21 APPOINTMENT REMINDER   [name]. was reminded to have all medications, supplements and any blood glucose and blood pressure readings available for review with Jeni Salles, Pharm. D, at his telephone visit on 07/01/21 at 9am.   Questions: Have you had any recent office visit or specialist visit outside of [place]? Yes.   Are there any concerns you would like to discuss during your office visit? Just the finasteride not working as well as he thought. He still gets up to pee like twice a night.  Are you having any problems obtaining your medications? No.   If patient has any PAP medications ask if they are having any problems getting their PAP medication or refill? No patient assistance at this time.   Care Gaps:  AWV - completed on 12/31/20 Zoster vaccines - never done Covid-19 vaccine booster 5 - overdue  Star Rating Drug:  Simvastatin 20mg  - last filled on 03/28/21 90DS at [facility]  Any gaps in medications fill history? No.  Miller  Clinical Pharmacist Assistant [phone]

## 2021-07-01 ENCOUNTER — Ambulatory Visit (INDEPENDENT_AMBULATORY_CARE_PROVIDER_SITE_OTHER): Payer: Medicare Other | Admitting: Pharmacist

## 2021-07-01 DIAGNOSIS — I10 Essential (primary) hypertension: Secondary | ICD-10-CM

## 2021-07-01 DIAGNOSIS — E785 Hyperlipidemia, unspecified: Secondary | ICD-10-CM

## 2021-07-01 NOTE — Progress Notes (Signed)
Chronic Care Management Pharmacy Note  07/08/2021 Name:  [name]. MRN:  [id number] DOB:  11/09/1930  Summary: Pt does not check BP at home LDL at goal < 70  Recommendations/Changes made from today's visit: -Recommended youtube videos for balance exercises (yoga) -Recommended weekly BP monitoring at home -Requested switch to potassium chloride 10 mEq tablets to avoid pill splitting  Plan: Follow up BP assessment in 2-3 months  Subjective: [name]. is an 87 y.o. year old male who is a primary patient of [name], MD.  The CCM team was consulted for assistance with disease management and care coordination needs.    Engaged with patient by telephone for initial visit in response to provider referral for pharmacy case management and/or care coordination services.   Consent to Services:  The patient was given the following information about Chronic Care Management services today, agreed to services, and gave verbal consent: 1. CCM service includes personalized support from designated clinical staff supervised by the primary care provider, including individualized plan of care and coordination with other care providers 2. 24/7 contact phone numbers for assistance for urgent and routine care needs. 3. Service will only be billed when office clinical staff spend 20 minutes or more in a month to coordinate care. 4. Only one practitioner may furnish and bill the service in a calendar month. 5.The patient may stop CCM services at any time (effective at the end of the month) by phone call to the office staff. 6. The patient will be responsible for cost sharing (co-pay) of up to 20% of the service fee (after annual deductible is met). Patient agreed to services and consent obtained.  Patient Care Team: [name], MD as PCP - General (Family Medicine) Evans Lance, MD as PCP - Cardiology (Cardiology) Viona Gilmore, [facility] as Pharmacist (Pharmacist)  Recent  office visits: 12/31/20 Jill Alexanders MD (PCP) - seen for routine general medical examination/ Medicare annual wellness exam at a health care facility and other chronic conditions. No medication changes. Follow up in 1 year or sooner if needed.   Recent consult visits: 05/26/21 Frederik Schmidt RN (Cardiology) - seen for anticoagulation office visit PT/INR check.   01/28/21 Jarome Matin MD (Dermatology) - seen for actinic keratosis and other skin related issues. Skin biopsies performed in office. No medication changes or follow up noted.   Hospital visits: None in previous 6 months   Objective:  Lab Results  Component Value Date   CREATININE 1.48 (H) 12/31/2020   BUN 21 12/31/2020   GFRNONAA 46 (L) 04/20/2020   GFRAA 54 (L) 04/20/2020   NA 143 12/31/2020   K 4.9 12/31/2020   CALCIUM 9.4 12/31/2020   CO2 23 12/31/2020   GLUCOSE 98 12/31/2020    No results found for: HGBA1C, FRUCTOSAMINE, GFR, MICROALBUR  Last diabetic Eye exam: No results found for: HMDIABEYEEXA  Last diabetic Foot exam: No results found for: HMDIABFOOTEX   Lab Results  Component Value Date   CHOL 118 12/31/2020   HDL 34 (L) 12/31/2020   LDLCALC 61 12/31/2020   TRIG 125 12/31/2020   CHOLHDL 3.5 12/31/2020    Hepatic Function Latest Ref Rng & Units 12/31/2020 04/20/2020 08/07/2019  Total Protein 6.0 - 8.5 g/dL 6.5 6.7 6.0  Albumin 3.6 - 4.6 g/dL 4.3 4.3 4.0  AST 0 - 40 IU/L $Remov'21 22 19  'OBBzdq$ ALT 0 - 44 IU/L $Remov'13 12 14  'wqfgYf$ Alk Phosphatase 44 - 121 IU/L 96  92 94  Total Bilirubin 0.0 - 1.2 mg/dL 0.9 0.9 0.8    No results found for: TSH, FREET4  CBC Latest Ref Rng & Units 12/31/2020 04/20/2020 05/14/2019  WBC 3.4 - 10.8 x10E3/uL 5.7 7.0 9.7  Hemoglobin 13.0 - 17.7 g/dL 13.5 13.6 11.2(L)  Hematocrit 37.5 - 51.0 % 41.8 43.4 35.8(L)  Platelets 150 - 450 x10E3/uL 134(L) 147(L) 175    No results found for: VD25OH  Clinical ASCVD: Yes  The ASCVD Risk score (Arnett DK, et al., 2019) failed to calculate for the following reasons:    The 2019 ASCVD risk score is only valid for ages 52 to 70    Depression screen PHQ 2/9 12/31/2020 04/20/2020 03/21/2019  Decreased Interest 0 0 0  Down, Depressed, Hopeless 0 0 0  PHQ - 2 Score 0 0 0  Some recent data might be hidden    CHA2DS2/VAS Stroke Risk Points  Current as of 12 minutes ago     4 >= 2 Points: High Risk  1 - 1.99 Points: Medium Risk  0 Points: Low Risk    Last Change: N/A      Details    This score determines the patient's risk of having a stroke if the  patient has atrial fibrillation.       Points Metrics  0 Has Congestive Heart Failure:  No    Current as of 12 minutes ago  1 Has Vascular Disease:  Yes    Current as of 12 minutes ago  1 Has Hypertension:  Yes    Current as of 12 minutes ago  2 Age:  60    Current as of 12 minutes ago  0 Has Diabetes:  No    Current as of 12 minutes ago  0 Had Stroke:  No  Had TIA:  No  Had Thromboembolism:  No    Current as of 12 minutes ago  0 Male:  No    Current as of 12 minutes ago    Social History   Tobacco Use  Smoking Status Former   Packs/day: 1.00   Years: 10.00   Pack years: 10.00   Types: Cigarettes   Quit date: 09/13/1979   Years since quitting: 41.8  Smokeless Tobacco Never   BP Readings from Last 3 Encounters:  12/31/20 (!) 166/76  11/03/20 140/86  10/29/20 130/78   Pulse Readings from Last 3 Encounters:  12/31/20 75  11/03/20 70  10/29/20 71   Wt Readings from Last 3 Encounters:  12/31/20 189 lb 12.8 oz (86.1 kg)  11/03/20 188 lb 3.2 oz (85.4 kg)  10/29/20 186 lb 3.2 oz (84.5 kg)   BMI Readings from Last 3 Encounters:  12/31/20 31.83 kg/m  11/03/20 31.32 kg/m  10/29/20 30.99 kg/m    Assessment/Interventions: Review of patient past medical history, allergies, medications, health status, including review of consultants reports, laboratory and other test data, was performed as part of comprehensive evaluation and provision of chronic care management services.   SDOH:  (Social  Determinants of Health) assessments and interventions performed: Yes SDOH Interventions    Flowsheet Row Most Recent Value  SDOH Interventions   Financial Strain Interventions Intervention Not Indicated  Transportation Interventions Intervention Not Indicated      SDOH Screenings   Alcohol Screen: Not on file  Depression (PHQ2-9): Low Risk    PHQ-2 Score: 0  Financial Resource Strain: Low Risk    Difficulty of Paying Living Expenses: Not hard at all  Food Insecurity: Not  on file  Housing: Not on file  Physical Activity: Not on file  Social Connections: Not on file  Stress: Not on file  Tobacco Use: Medium Risk   Smoking Tobacco Use: Former   Smokeless Tobacco Use: Never   Passive Exposure: Not on file  Transportation Needs: No Transportation Needs   Lack of Transportation (Medical): No   Lack of Transportation (Non-Medical): No   Patient has been a patient of Dr. Lanice Shirts since before he was   Patient has had both knees replaced in the last 2 years and he uses a cane or walker. Patient does not have pain in the knees but they don't work very well and has to stand up slowly. Patient did have a fall the night before last and he was standing next to the recliner and he turned and he went down and he broke the arm on the chair. Patient's balance is terrible.  Patient doesn't do any exercises. He doesn't have a good place to walk and his driveway is gravel. He does feel unsteady.   He does go out every once in a while and is going to lunch with his buddy today. Patient is going to [employer] for fish today at the [facility] and his brother is also coming up from [place].  Patient lives with his daughter. She just retired as a Pharmacist, hospital.  Patient eats out once a week. Patient usually cooks his own breakfast and then doesn't usually eat lunch and his daughter cooks dinner. His son in law is a Retail banker and he eats a lot of deer meat. He does eat a lot of chicken otherwise. His daughter  is a Systems developer and cooks a variety and he eats vegetables every day. Patient is not very picky and doesn't eat desserts often. Patient drinks water and regular soda (1 can every day) and coffee every morning.  Patient sleeps pretty well and doesn't have trouble going to sleep. He does wake up during the night but easily goes back to sleep. Patient sometimes takes naps.  Patient doesn't feel like the finasteride is working very well. Patient uses a pillbox for one week at a time. Patient feels pretty comfortable with his medications.   CCM Care Plan  Allergies  Allergen Reactions   Tramadol Itching    Medications Reviewed Today     Reviewed by Viona Gilmore, [facility] (Pharmacist) on 07/01/21 at [phone]  Med List Status: <None>   Medication Order Taking? Sig Documenting Provider Last Dose Status Informant  acetaminophen (TYLENOL) 500 MG tablet [id number] Yes Take 500 mg by mouth every 6 (six) hours as needed (for pain).  [provider] Taking Active Self  allopurinol (ZYLOPRIM) 100 MG tablet [id number] Yes TAKE 1 TABLET BY MOUTH EVERY DAY [name], MD Taking Active   carvedilol (COREG) 3.125 MG tablet [id number] Yes TAKE 1 TABLET BY MOUTH  TWICE DAILY Evans Lance, MD Taking Active   Cholecalciferol (VITAMIN D3) 1000 units CAPS [id number] Yes Take 2,000 Units by mouth daily. [provider] Taking Active Self           Med Note Duffy Bruce, Ardelia Mems Sep 17, 2018  6:17 PM)    doxazosin (CARDURA) 4 MG tablet [id number] Yes TAKE 1 TABLET BY MOUTH AT  BEDTIME Evans Lance, MD Taking Active   finasteride (PROSCAR) 5 MG tablet [id number] Yes TAKE 1 TABLET BY MOUTH EVERY DAY [name], MD Taking Active   latanoprost (XALATAN) 0.005 %  ophthalmic solution [id number] Yes Place 1 drop into both eyes at bedtime.  [provider] Taking Active Self  Multiple Vitamins-Minerals (PRESERVISION AREDS 2 PO) [id number] Yes Take 1 capsule by mouth 2 (two) times daily.   [provider] Taking Active Self           Med Note Duffy Bruce, Ardelia Mems Sep 17, 2018  6:20 PM)    potassium chloride SA (KLOR-CON) 20 MEQ tablet [id number] Yes TAKE ONE-HALF TABLET BY  MOUTH DAILY [name], MD Taking Active   simvastatin (ZOCOR) 20 MG tablet [id number] Yes Take 1 tablet (20 mg total) by mouth at bedtime. [name], MD Taking Active   warfarin (COUMADIN) 2.5 MG tablet [id number] Yes TAKE 1 TO 1 AND 1/2 TABLETS BY MOUTH DAILY AS DIRECTED  BY THE COUMADIN CLINIC Evans Lance, MD Taking Active             Patient Active Problem List   Diagnosis Date Noted   Aortic atherosclerosis ([place]) 10/29/2020   Gout involving toe 08/07/2019   Benign prostatic hyperplasia with urinary frequency 03/21/2019   Status post total bilateral knee replacement 09/06/2018   S/P right TKA 02/27/2018   Obesity    GERD (gastroesophageal reflux disease)    ED (erectile dysfunction)    Diverticulosis    CAD (coronary artery disease)    Permanent atrial fibrillation (HCC)    CKD (chronic kidney disease) stage 3, GFR 30-59 ml/min (HCC) 01/13/2017   Atherosclerosis 12/12/2016   Arthritis 12/01/2014   Abnormality of gait 02/05/2013   Long term (current) use of anticoagulants 12/29/2012   Glaucoma 07/26/2011   Hyperlipidemia LDL goal <70 07/26/2011   Hypertension 07/26/2011   PACEMAKER-St.Jude 12/26/2008    Immunization History  Administered Date(s) Administered   Fluad Quad(high Dose 65+) 05/28/2019   Influenza Split 07/26/2011, 06/25/2012   Influenza Whole 08/08/2007, 06/11/2008, 06/01/2010   Influenza, High Dose Seasonal PF 06/18/2013, 06/06/2014, 05/19/2015, 05/31/2016, 05/16/2017, 06/08/2018, 06/24/2020   Influenza-Unspecified 06/21/2021   PFIZER Comirnaty(Gray Top)Covid-19 Tri-Sucrose Vaccine 12/31/2020   PFIZER(Purple Top)SARS-COV-2 Vaccination 10/07/2019, 10/28/2019, 06/22/2020   Pfizer Covid-19 Vaccine Bivalent Booster 13yrs & up 06/21/2021   Pneumococcal  Conjugate-13 06/25/2015   Pneumococcal Polysaccharide-23 01/03/1998, 06/11/2008   Tdap 09/10/2008, 05/16/2017    Conditions to be addressed/monitored:  Hypertension, Hyperlipidemia, Atrial Fibrillation, Coronary Artery Disease, GERD, Chronic Kidney Disease, Osteoarthritis, BPH, and Gout  Care Plan : [place]  Updates made by Viona Gilmore, [place] since 07/08/2021 12:00 AM     Problem: Problem: Hypertension, Hyperlipidemia, Atrial Fibrillation, Coronary Artery Disease, GERD, Chronic Kidney Disease, Osteoarthritis, BPH, and Gout      Long-Range Goal: Patient-Specific Goal   Start Date: 07/01/2021  Expected End Date: 07/01/2021  This Visit's Progress: On track  Priority: High  Note:   Current Barriers:  Unable to independently monitor therapeutic efficacy  Pharmacist Clinical Goal(s):  Patient will achieve adherence to monitoring guidelines and medication adherence to achieve therapeutic efficacy through collaboration with PharmD and provider.   Interventions: 1:1 collaboration with [name], MD regarding development and update of comprehensive plan of care as evidenced by provider attestation and co-signature Inter-disciplinary care team collaboration (see longitudinal plan of care) Comprehensive medication review performed; medication list updated in electronic medical record  Hypertension (BP goal <140/90) -Not ideally controlled -Current treatment: Carvedilol 3.125 mg 1 tablet twice daily Doxazosin 4 mg 1 tablet at bedtime  -Medications previously tried: n/a  -Current home readings: does not  check (believes daughter has a machine) -Current dietary habits: he has never been a salt person - only on tomato and sandwiches -Current exercise habits: no structured exercise -Denies hypotensive/hypertensive symptoms -Educated on Importance of home blood pressure monitoring; Proper BP monitoring technique; Symptoms of hypotension and importance of maintaining  adequate hydration; -Counseled to monitor BP at home weekly, document, and provide log at future appointments -Recommended to continue current medication Counseled on taking carvedilol with food.  Hyperlipidemia: (LDL goal < 70) -Controlled -Current treatment: Simvastatin 20 mg 1 tablet daily at bedtime -Medications previously tried: none  -Current dietary patterns: eats fish once a week; doesn't eat oatmeal -Current exercise habits: no structured exercise -Educated on Cholesterol goals;  Benefits of statin for ASCVD risk reduction; -Counseled on diet and exercise extensively Recommended to continue current medication  Atrial Fibrillation (Goal: prevent stroke and major bleeding) -Controlled -CHADSVASC: 4 -Current treatment: Rate control: Carvedilol 3.125 mg 1 tablet twice daily Anticoagulation: warfarin 2.5 mg tablet as directed by coumadin clinic -Medications previously tried: none -Home BP and HR readings: does not check  -Counseled on increased risk of stroke due to Afib and benefits of anticoagulation for stroke prevention; bleeding risk associated with warfarin and importance of self-monitoring for signs/symptoms of bleeding; avoidance of NSAIDs due to increased bleeding risk with anticoagulants; -Recommended to continue current medication  CAD (Goal: prevent heart events) -Controlled -Current treatment  Warfarin 2.5 mg tablet as directed by coumadin clinic Simvastatin 20 mg 1 tablet daily -Medications previously tried: none  -Recommended to continue current medication  Gout (Goal: uric acid < 6 and prevent flare ups) -Controlled -Current treatment  Allopurinol 100 mg 1 tablet daily -Medications previously tried: none  -Counseled on foods/beverages that can increase uric acid levels and increase risk for gout.  BPH (Goal: minimize symptoms of enlarged prostate) -Not ideally controlled -Current treatment  Doxazosin 4 mg 1 tablet at bedtime  Finasteride 5 mg 1  tablet daily -Medications previously tried: none  -Recommended cutting back on caffeine intake to limit frequent bathroom trips. Educated on availability of another medication (dutasteride) to replace finasteride as patient felt it wasn't working. He will consider.   Health Maintenance -Vaccine gaps: shingrix, COVID booster -Current therapy:  Vitamin D3 2000 units daily  Latanoprost 0.005% 1 drop in both eyes at bedtime Potassium chloride 20 mEq 1/2 tablet daily Multivitamin 1 tablet twice daily Acetaminophen 500 mg as needed -Educated on Cost vs benefit of each product must be carefully weighed by individual consumer -Patient is satisfied with current therapy and denies issues -Recommended switching to 10 mEq tablet to avoid pill splitting.  Patient Goals/Self-Care Activities Patient will:  - take medications as prescribed check blood pressure weekly, document, and provide at future appointments  Follow Up Plan: Telephone follow up appointment with care management team member scheduled for: 6 months      Medication Assistance: None required.  Patient affirms current coverage meets needs.  Compliance/Adherence/Medication fill history: Care Gaps: Shingrix, COVID booster Last BP: 166/76  Star-Rating Drugs: Simvastatin 20mg  - last filled on 03/28/21 90DS at [facility]  Patient's preferred pharmacy is:  [id number] [address], [place] [address] Phone: [phone] Fax: [phone]  [facility]  ([place]) - [place], [address] [facility] [place] [address] Phone: [phone] Fax: [place] [address] [place] [address] Phone: [phone] Fax:  [facility] ([facility]) - [place], [place] [address] [place] [address] Phone:  [phone] Fax: [phone]  Uses pill box? Yes - weekly Pt endorses 95% compliance (once or twice a month)  We discussed: Current pharmacy is preferred with insurance plan and patient is satisfied with pharmacy services Patient decided to: Continue current medication management strategy  Care Plan and Follow Up Patient Decision:  Patient agrees to Care Plan and Follow-up.  Plan: Telephone follow up appointment with care management team member scheduled for:  6 months  Jeni Salles, PharmD, [place] [facility] [place] [phone]

## 2021-07-05 ENCOUNTER — Other Ambulatory Visit: Payer: Self-pay | Admitting: *Deleted

## 2021-07-05 ENCOUNTER — Other Ambulatory Visit: Payer: Self-pay | Admitting: Internal Medicine

## 2021-07-05 NOTE — Telephone Encounter (Signed)
Prescription refill request received for warfarin [place]: 11/03/20 [name]) Next INR check: 07/07/21  Warfarin tablet strength: 2.5mg   Appropriate dose and refill sent to requested pharmacy.

## 2021-07-07 ENCOUNTER — Other Ambulatory Visit: Payer: Self-pay

## 2021-07-07 ENCOUNTER — Ambulatory Visit: Payer: Medicare Other

## 2021-07-07 DIAGNOSIS — Z7901 Long term (current) use of anticoagulants: Secondary | ICD-10-CM | POA: Diagnosis not present

## 2021-07-07 LAB — POCT INR: INR: 2.6 (ref 2.0–3.0)

## 2021-07-07 NOTE — Patient Instructions (Signed)
Continue taking 2.5 mg daily except 3.75 mg each Monday.  Repeat INR In 6 weeks

## 2021-07-08 ENCOUNTER — Telehealth: Payer: Self-pay

## 2021-07-08 NOTE — Patient Instructions (Addendum)
Hi [name],  It was great to get to meet you over the telephone! Don't forget to look into getting a blood pressure cuff so you can start checking it at home.   Please reach out to me if you have any questions or need anything before our follow up!  Best, [name]  [name], PharmD, [facility]   Visit Information   Goals Addressed             This Visit's Progress    Track and Manage My Blood Pressure-Hypertension       Timeframe:  Long-Range Goal Priority:  Medium Start Date:                             Expected End Date:                       Follow Up Date 10/01/21    - check blood pressure weekly - choose a place to take my blood pressure (home, clinic or office, retail store) - write blood pressure results in a log or diary    Why is this important?   You won't feel high blood pressure, but it can still hurt your blood vessels.  High blood pressure can cause heart or kidney problems. It can also cause a stroke.  Making lifestyle changes like losing a little weight or eating less salt will help.  Checking your blood pressure at home and at different times of the day can help to control blood pressure.  If the doctor prescribes medicine remember to take it the way the doctor ordered.  Call the office if you cannot afford the medicine or if there are questions about it.     Notes:        Patient Care Plan: CCM Pharmacy Care Plan     Problem Identified: Problem: Hypertension, Hyperlipidemia, Atrial Fibrillation, Coronary Artery Disease, GERD, Chronic Kidney Disease, Osteoarthritis, BPH, and Gout      Long-Range Goal: Patient-Specific Goal   Start Date: 07/01/2021  Expected End Date: 07/01/2021  This Visit's Progress: On track  Priority: High  Note:   Current Barriers:  Unable to independently monitor therapeutic efficacy  Pharmacist Clinical Goal(s):  Patient will achieve adherence to  monitoring guidelines and medication adherence to achieve therapeutic efficacy through collaboration with PharmD and provider.   Interventions: 1:1 collaboration with Mark Lung, MD regarding development and update of comprehensive plan of care as evidenced by provider attestation and co-signature Inter-disciplinary care team collaboration (see longitudinal plan of care) Comprehensive medication review performed; medication list updated in electronic medical record  Hypertension (BP goal <140/90) -Not ideally controlled -Current treatment: Carvedilol 3.125 mg 1 tablet twice daily Doxazosin 4 mg 1 tablet at bedtime  -Medications previously tried: n/a  -Current home readings: does not check (believes daughter has a machine) -Current dietary habits: he has never been a salt person - only on tomato and sandwiches -Current exercise habits: no structured exercise -Denies hypotensive/hypertensive symptoms -Educated on Importance of home blood pressure monitoring; Proper BP monitoring technique; Symptoms of hypotension and importance of maintaining adequate hydration; -Counseled to monitor BP at home weekly, document, and provide log at future appointments -Recommended to continue current medication Counseled on taking carvedilol with food.  Hyperlipidemia: (LDL goal < 70) -Controlled -Current treatment: Simvastatin 20 mg 1 tablet daily at bedtime -Medications previously tried: none  -Current dietary patterns:  eats fish once a week; doesn't eat oatmeal -Current exercise habits: no structured exercise -Educated on Cholesterol goals;  Benefits of statin for ASCVD risk reduction; -Counseled on diet and exercise extensively Recommended to continue current medication  Atrial Fibrillation (Goal: prevent stroke and major bleeding) -Controlled -CHADSVASC: 4 -Current treatment: Rate control: Carvedilol 3.125 mg 1 tablet twice daily Anticoagulation: warfarin 2.5 mg tablet as directed by  coumadin clinic -Medications previously tried: none -Home BP and HR readings: does not check  -Counseled on increased risk of stroke due to Afib and benefits of anticoagulation for stroke prevention; bleeding risk associated with warfarin and importance of self-monitoring for signs/symptoms of bleeding; avoidance of NSAIDs due to increased bleeding risk with anticoagulants; -Recommended to continue current medication  CAD (Goal: prevent heart events) -Controlled -Current treatment  Warfarin 2.5 mg tablet as directed by coumadin clinic Simvastatin 20 mg 1 tablet daily -Medications previously tried: none  -Recommended to continue current medication  Gout (Goal: uric acid < 6 and prevent flare ups) -Controlled -Current treatment  Allopurinol 100 mg 1 tablet daily -Medications previously tried: none  -Counseled on foods/beverages that can increase uric acid levels and increase risk for gout.  BPH (Goal: minimize symptoms of enlarged prostate) -Not ideally controlled -Current treatment  Doxazosin 4 mg 1 tablet at bedtime  Finasteride 5 mg 1 tablet daily -Medications previously tried: none  -Recommended cutting back on caffeine intake to limit frequent bathroom trips. Educated on availability of another medication (dutasteride) to replace finasteride as patient felt it wasn't working. He will consider.   Health Maintenance -Vaccine gaps: shingrix, COVID booster -Current therapy:  Vitamin D3 2000 units daily  Latanoprost 0.005% 1 drop in both eyes at bedtime Potassium chloride 20 mEq 1/2 tablet daily Multivitamin 1 tablet twice daily Acetaminophen 500 mg as needed -Educated on Cost vs benefit of each product must be carefully weighed by individual consumer -Patient is satisfied with current therapy and denies issues -Recommended switching to 10 mEq tablet to avoid pill splitting.  Patient Goals/Self-Care Activities Patient will:  - take medications as prescribed check blood  pressure weekly, document, and provide at future appointments  Follow Up Plan: Telephone follow up appointment with care management team member scheduled for: 6 months      [name] was given information about Chronic Care Management services today including:  CCM service includes personalized support from designated clinical staff supervised by his physician, including individualized plan of care and coordination with other care providers 24/7 contact phone numbers for assistance for urgent and routine care needs. Standard insurance, coinsurance, copays and deductibles apply for chronic care management only during months in which we provide at least 20 minutes of these services. Most insurances cover these services at 100%, however patients may be responsible for any copay, coinsurance and/or deductible if applicable. This service may help you avoid the need for more expensive face-to-face services. Only one practitioner may furnish and bill the service in a calendar month. The patient may stop CCM services at any time (effective at the end of the month) by phone call to the office staff.  Patient agreed to services and verbal consent obtained.   The patient verbalized understanding of instructions, educational materials, and care plan provided today and agreed to receive a mailed copy of patient instructions, educational materials, and care plan.  Telephone follow up appointment with pharmacy team member scheduled for: 6 months  Viona Gilmore, [facility]

## 2021-07-08 NOTE — Telephone Encounter (Signed)
Spoke to pt and advised. Pt was also made an appointment to follow up on change [facility]

## 2021-07-08 NOTE — Telephone Encounter (Signed)
-----   Message from [name], MD sent at 07/08/2021 12:27 PM EDT ----- Regarding: RE: Potassium refill Let him know that at that lower dose we can probably get away with not using it.  You are having come back in another month or so to check that.  Probably time for him to come in for recheck appointment anyway ----- Message ----- From: Elyse Jarvis, [place]: 07/08/2021  10:55 AM EDT To: [name], MD Subject: FW: Potassium refill                           Please advise if this can be changed [place] ----- Message ----- From: Viona Gilmore, [facility] Sent: 07/08/2021  10:30 AM EDT To: [facility] Clinical Pool Subject: Potassium refill                               Hi,  When I spoke with Mr. Conigliaro, he was having issues splitting his potassium tablets (getting them equal sizes). Can the potassium be refilled at 1 full 10 mEq tablet instead of 1/2 of the 20 mEq tablets? He requested for the refill to be sent to [facility] mail order.  Thank you! Maddie

## 2021-07-12 DIAGNOSIS — I10 Essential (primary) hypertension: Secondary | ICD-10-CM | POA: Diagnosis not present

## 2021-07-12 DIAGNOSIS — E785 Hyperlipidemia, unspecified: Secondary | ICD-10-CM

## 2021-07-22 ENCOUNTER — Ambulatory Visit (INDEPENDENT_AMBULATORY_CARE_PROVIDER_SITE_OTHER): Payer: Medicare Other | Admitting: Family Medicine

## 2021-07-22 ENCOUNTER — Encounter: Payer: Self-pay | Admitting: Family Medicine

## 2021-07-22 ENCOUNTER — Other Ambulatory Visit: Payer: Self-pay

## 2021-07-22 VITALS — BP 140/80 | HR 67 | Temp 96.0°F | Wt 184.8 lb

## 2021-07-22 DIAGNOSIS — R29898 Other symptoms and signs involving the musculoskeletal system: Secondary | ICD-10-CM

## 2021-07-22 DIAGNOSIS — H409 Unspecified glaucoma: Secondary | ICD-10-CM | POA: Diagnosis not present

## 2021-07-22 DIAGNOSIS — E785 Hyperlipidemia, unspecified: Secondary | ICD-10-CM | POA: Diagnosis not present

## 2021-07-22 DIAGNOSIS — Z950 Presence of cardiac pacemaker: Secondary | ICD-10-CM

## 2021-07-22 DIAGNOSIS — Z7901 Long term (current) use of anticoagulants: Secondary | ICD-10-CM | POA: Diagnosis not present

## 2021-07-22 DIAGNOSIS — I700 Atherosclerosis of aorta: Secondary | ICD-10-CM | POA: Diagnosis not present

## 2021-07-22 DIAGNOSIS — I10 Essential (primary) hypertension: Secondary | ICD-10-CM | POA: Diagnosis not present

## 2021-07-22 NOTE — Progress Notes (Addendum)
   Subjective:    Patient ID: [name]., male    DOB: May 03, 1931, 87 y.o.   MRN: [id number]  HPI He is here for an interval evaluation.  He continues to be followed by cardiology and gets routine Coumadin checks as well as cardiac monitoring.Marland Kitchen  He continues on finasteride for prostate related symptoms.  He also is taking his simvastatin.  He continues on allopurinol has not had any gout attacks.  He is taking cord without difficulty.  His physical activity is quite limited.  He admits to not exercising regularly.  He does state he is having some difficulty in swallowing but is vague about whether liquid or solid causing any trouble.  He will keep in touch with me concerning that. He does have x-ray evidence of aortic atherosclerosis.  Review of Systems     Objective:   Physical Exam Alert and in no distress.  Left lateral conjunctival hemorrhaging is noted in the left eye.  Cardiac exam does show an irregular rhythm with a grade 1/6 systolic murmur.  Lungs are clear to auscultation.       Assessment & Plan:  Muscular deconditioning  Atherosclerosis of aorta (HCC)  Primary hypertension  Hyperlipidemia LDL goal <70  [facility]  Long term (current) use of anticoagulants  Glaucoma of both eyes, unspecified glaucoma type He will continue on his present medication regimen.  I strongly encouraged him to become more physically active.  Information given concerning transportation through [place]. Because of these mobility issues he will also probably need a wheelchair for any significant travel requirements

## 2021-07-28 ENCOUNTER — Encounter: Payer: Medicare Other | Admitting: Family Medicine

## 2021-08-13 ENCOUNTER — Encounter: Payer: Self-pay | Admitting: Family Medicine

## 2021-08-13 DIAGNOSIS — M1990 Unspecified osteoarthritis, unspecified site: Secondary | ICD-10-CM

## 2021-08-18 ENCOUNTER — Other Ambulatory Visit: Payer: Self-pay

## 2021-08-18 ENCOUNTER — Ambulatory Visit: Payer: Medicare Other

## 2021-08-18 DIAGNOSIS — Z7901 Long term (current) use of anticoagulants: Secondary | ICD-10-CM

## 2021-08-18 LAB — POCT INR: INR: 2.7 (ref 2.0–3.0)

## 2021-08-18 NOTE — Patient Instructions (Signed)
Continue taking 2.5 mg daily except 3.75 mg each Monday.  Repeat INR In 8 weeks

## 2021-08-22 ENCOUNTER — Other Ambulatory Visit: Payer: Self-pay | Admitting: Family Medicine

## 2021-08-24 ENCOUNTER — Telehealth: Payer: Self-pay | Admitting: Pharmacist

## 2021-08-24 NOTE — Progress Notes (Signed)
Chronic Care Management Pharmacy Assistant   Name: [name].  MRN: [id number] DOB: March 09, 1931  Reason for Encounter: Disease State Hypertension Assessment Call   Conditions to be addressed/monitored: HTN  Recent office visits:  07/22/21 [name], MD - Clinical Support note for Muscular deconditioning and other concerns. No medication changes.  Recent consult visits:  08/24/21 Dapp, Hoover Browns RN (Cardiology) - Patient presented for Anti Coag Visit reading was 2.7  Hospital visits:  None in previous 6 months  Medications: Outpatient Encounter Medications as of 08/24/2021  Medication Sig   acetaminophen (TYLENOL) 500 MG tablet Take 500 mg by mouth every 6 (six) hours as needed (for pain).    allopurinol (ZYLOPRIM) 100 MG tablet TAKE 1 TABLET BY MOUTH EVERY DAY   carvedilol (COREG) 3.125 MG tablet TAKE 1 TABLET BY MOUTH  TWICE DAILY   Cholecalciferol (VITAMIN D3) 1000 units CAPS Take 2,000 Units by mouth daily.   doxazosin (CARDURA) 4 MG tablet TAKE 1 TABLET BY MOUTH AT  BEDTIME   finasteride (PROSCAR) 5 MG tablet TAKE 1 TABLET BY MOUTH EVERY DAY   latanoprost (XALATAN) 0.005 % ophthalmic solution Place 1 drop into both eyes at bedtime.    Multiple Vitamins-Minerals (PRESERVISION AREDS 2 PO) Take 1 capsule by mouth 2 (two) times daily.    potassium chloride SA (KLOR-CON) 20 MEQ tablet TAKE ONE-HALF TABLET BY  MOUTH DAILY (Patient not taking: Reported on 07/22/2021)   simvastatin (ZOCOR) 20 MG tablet TAKE 1 TABLET BY MOUTH AT  BEDTIME   warfarin (COUMADIN) 2.5 MG tablet TAKE 1 TO 1 AND 1/2 TABLETS BY MOUTH DAILY AS DIRECTED  BY THE COUMADIN CLINIC   Facility-Administered Encounter Medications as of 08/24/2021  Medication   influenza  inactive virus vaccine (FLUZONE/FLUARIX) injection 0.5 mL  Reviewed chart prior to disease state call. Spoke with patient regarding BP  Recent Office Vitals: BP Readings from Last 3 Encounters:  07/22/21 140/80  12/31/20 (!) 166/76   11/03/20 140/86   Pulse Readings from Last 3 Encounters:  07/22/21 67  12/31/20 75  11/03/20 70    Wt Readings from Last 3 Encounters:  07/22/21 184 lb 12.8 oz (83.8 kg)  12/31/20 189 lb 12.8 oz (86.1 kg)  11/03/20 188 lb 3.2 oz (85.4 kg)     Kidney Function Lab Results  Component Value Date/Time   CREATININE 1.48 (H) 12/31/2020 02:11 PM   CREATININE 1.36 (H) 04/20/2020 01:53 PM   CREATININE 1.45 (H) 01/12/2017 08:49 AM   CREATININE 1.51 (H) 06/16/2015 12:01 AM   GFRNONAA 46 (L) 04/20/2020 01:53 PM   GFRAA 54 (L) 04/20/2020 01:53 PM    BMP Latest Ref Rng & Units 12/31/2020 04/20/2020 08/07/2019  Glucose 65 - 99 mg/dL 98 81 113(H)  BUN 8 - 27 mg/dL 21 22 21   Creatinine 0.76 - 1.27 mg/dL 1.48(H) 1.36(H) 1.21  BUN/Creat Ratio 10 - 24 14 16 17   Sodium 134 - 144 mmol/L 143 144 143  Potassium 3.5 - 5.2 mmol/L 4.9 5.4(H) 4.8  Chloride 96 - 106 mmol/L 103 105 106  CO2 20 - 29 mmol/L 23 27 23   Calcium 8.6 - 10.2 mg/dL 9.4 10.0 9.5    Current antihypertensive regimen:  Carvedilol 3.125 mg 1 tablet twice daily Doxazosin 4 mg 1 tablet at bedtime  How often are you checking your Blood Pressure? 1-2x per week Current home BP readings: Patient reports on the lower end his pressures have been 130/75 and no higher than 140/80 since he  started checking at home What recent interventions/DTPs have been made by any provider to improve Blood Pressure control since last CPP Visit: Patient reports no changes Any recent hospitalizations or ED visits since last visit with CPP? No What diet changes have been made to improve Blood Pressure Control?  Patient reports he is not using much salt at all and has a good appetite still. What exercise is being done to improve your Blood Pressure Control?  Patient reports his activity level is still about the same.  Adherence Review: Is the patient currently on ACE/ARB medication? No Does the patient have >5 day gap between last estimated fill dates?  No     Care Gaps: BP- 166/76 ( 12/31/20) Home 130/75 Pharm Follow up - 4/23 AWV-4/22 Zoster Vaccine - Overdue  Star Rating Drugs: Simvastatin (Zocor) 20 mg - Last filled 06/22/21 90 DS at [place] with pharmacy as accurate    [facility] Clinical Pharmacist Assistant [phone]

## 2021-09-01 ENCOUNTER — Ambulatory Visit (INDEPENDENT_AMBULATORY_CARE_PROVIDER_SITE_OTHER): Payer: Medicare Other

## 2021-09-01 DIAGNOSIS — I4821 Permanent atrial fibrillation: Secondary | ICD-10-CM | POA: Diagnosis not present

## 2021-09-01 LAB — CUP PACEART REMOTE DEVICE CHECK
Battery Remaining Longevity: 16 mo
Battery Remaining Percentage: 14 %
Battery Voltage: 2.87 V
Brady Statistic RV Percent Paced: 44 %
Date Time Interrogation Session: 20221221095935
Implantable Lead Implant Date: 20040429
Implantable Lead Location: 753860
Implantable Pulse Generator Implant Date: 20110325
Lead Channel Impedance Value: 460 Ohm
Lead Channel Pacing Threshold Amplitude: 1 V
Lead Channel Pacing Threshold Pulse Width: 0.9 ms
Lead Channel Sensing Intrinsic Amplitude: 1.9 mV
Lead Channel Setting Pacing Amplitude: 2.5 V
Lead Channel Setting Pacing Pulse Width: 0.9 ms
Lead Channel Setting Sensing Sensitivity: 0.7 mV
Pulse Gen Model: 1110
Pulse Gen Serial Number: 2313421

## 2021-09-09 NOTE — Progress Notes (Signed)
Remote pacemaker transmission.   

## 2021-09-28 ENCOUNTER — Other Ambulatory Visit: Payer: Self-pay | Admitting: Family Medicine

## 2021-09-28 NOTE — Telephone Encounter (Signed)
[facility] is requesting to fill pt allopurinol. Please advise [facility]

## 2021-10-05 DIAGNOSIS — H401131 Primary open-angle glaucoma, bilateral, mild stage: Secondary | ICD-10-CM | POA: Diagnosis not present

## 2021-10-05 DIAGNOSIS — Z961 Presence of intraocular lens: Secondary | ICD-10-CM | POA: Diagnosis not present

## 2021-10-14 ENCOUNTER — Ambulatory Visit: Payer: Medicare Other

## 2021-10-14 ENCOUNTER — Other Ambulatory Visit: Payer: Self-pay

## 2021-10-14 DIAGNOSIS — Z7901 Long term (current) use of anticoagulants: Secondary | ICD-10-CM | POA: Diagnosis not present

## 2021-10-14 LAB — POCT INR: INR: 2.6 (ref 2.0–3.0)

## 2021-10-14 NOTE — Patient Instructions (Signed)
Continue taking 2.5 mg daily except 3.75 mg each Monday.  Repeat INR In 8 weeks

## 2021-11-04 ENCOUNTER — Other Ambulatory Visit: Payer: Self-pay

## 2021-11-04 MED ORDER — DOXAZOSIN MESYLATE 4 MG PO TABS: 4.0000 mg | ORAL_TABLET | Freq: Every day | ORAL | 0 refills | Status: DC

## 2021-11-04 NOTE — Telephone Encounter (Signed)
Pt's medication was sent to pt's pharmacy as requested. Confirmation received.  °

## 2021-11-07 ENCOUNTER — Other Ambulatory Visit: Payer: Self-pay | Admitting: Internal Medicine

## 2021-12-01 ENCOUNTER — Ambulatory Visit (INDEPENDENT_AMBULATORY_CARE_PROVIDER_SITE_OTHER): Payer: Medicare Other

## 2021-12-01 DIAGNOSIS — I4821 Permanent atrial fibrillation: Secondary | ICD-10-CM

## 2021-12-01 LAB — CUP PACEART REMOTE DEVICE CHECK
Battery Remaining Longevity: 14 mo
Battery Remaining Percentage: 12 %
Battery Voltage: 2.86 V
Brady Statistic RV Percent Paced: 45 %
Date Time Interrogation Session: 20230322073754
Implantable Lead Implant Date: 20040429
Implantable Lead Location: 753860
Implantable Pulse Generator Implant Date: 20110325
Lead Channel Impedance Value: 440 Ohm
Lead Channel Pacing Threshold Amplitude: 1 V
Lead Channel Pacing Threshold Pulse Width: 0.9 ms
Lead Channel Sensing Intrinsic Amplitude: 1.3 mV
Lead Channel Setting Pacing Amplitude: 2.5 V
Lead Channel Setting Pacing Pulse Width: 0.9 ms
Lead Channel Setting Sensing Sensitivity: 0.7 mV
Pulse Gen Model: 1110
Pulse Gen Serial Number: 2313421

## 2021-12-07 ENCOUNTER — Ambulatory Visit: Payer: Medicare Other | Admitting: *Deleted

## 2021-12-07 ENCOUNTER — Other Ambulatory Visit: Payer: Self-pay

## 2021-12-07 DIAGNOSIS — Z5181 Encounter for therapeutic drug level monitoring: Secondary | ICD-10-CM | POA: Diagnosis not present

## 2021-12-07 DIAGNOSIS — Z7901 Long term (current) use of anticoagulants: Secondary | ICD-10-CM

## 2021-12-07 LAB — POCT INR: INR: 2.7 (ref 2.0–3.0)

## 2021-12-07 NOTE — Patient Instructions (Signed)
Description   ?Continue taking 2.5 mg daily except 3.75 mg each Monday.  Repeat INR In 8 weeks ?  ? ? ?

## 2021-12-14 NOTE — Progress Notes (Signed)
Remote pacemaker transmission.   

## 2021-12-15 ENCOUNTER — Telehealth: Payer: Self-pay | Admitting: Pharmacist

## 2021-12-15 NOTE — Chronic Care Management (AMB) (Signed)
A user error has taken place: encounter opened in error, closed for administrative reasons.

## 2021-12-15 NOTE — Progress Notes (Signed)
? ? ?  Chronic Care Management ?Pharmacy Assistant  ? ? ?Name: [name].  MRN: [id number] DOB: 07-26-1931 ? ?12/15/21 APPOINTMENT REMINDER ? ? ?Patient was reminded to have all medications, supplements and any blood glucose and blood pressure readings available for review with Jeni Salles, Pharm. D, for telephone visit on 12/16/21 at 8:30. ? ? ? ?Care Gaps: ?BP- 166/76 ( 12/31/20) Home 130/75 ?AWV-4/22 ?Zoster Vaccine - Overdue ? ?Star Rating Drug: ?Simvastatin (Zocor) 20 mg - Last filled 12/07/21 90 DS at [place] ? ?  ? ? ?Medications: ?Outpatient Encounter Medications as of 12/15/2021  ?Medication Sig  ? allopurinol (ZYLOPRIM) 100 MG tablet TAKE 1 TABLET BY MOUTH EVERY DAY  ? acetaminophen (TYLENOL) 500 MG tablet Take 500 mg by mouth every 6 (six) hours as needed (for pain).   ? carvedilol (COREG) 3.125 MG tablet TAKE 1 TABLET BY MOUTH  TWICE DAILY  ? Cholecalciferol (VITAMIN D3) 1000 units CAPS Take 2,000 Units by mouth daily.  ? doxazosin (CARDURA) 4 MG tablet Take 1 tablet (4 mg total) by mouth at bedtime. Please keep upcoming appt in April 2023 with Dr. Lovena Le before anymore refills. Thank you Final Attempt  ? finasteride (PROSCAR) 5 MG tablet TAKE 1 TABLET BY MOUTH EVERY DAY  ? latanoprost (XALATAN) 0.005 % ophthalmic solution Place 1 drop into both eyes at bedtime.   ? Multiple Vitamins-Minerals (PRESERVISION AREDS 2 PO) Take 1 capsule by mouth 2 (two) times daily.   ? potassium chloride SA (KLOR-CON) 20 MEQ tablet TAKE ONE-HALF TABLET BY  MOUTH DAILY (Patient not taking: Reported on 07/22/2021)  ? simvastatin (ZOCOR) 20 MG tablet TAKE 1 TABLET BY MOUTH AT  BEDTIME  ? warfarin (COUMADIN) 2.5 MG tablet TAKE 1 TO 1 AND 1/2 TABLETS BY MOUTH DAILY AS DIRECTED  BY THE COUMADIN CLINIC  ? ?Facility-Administered Encounter Medications as of 12/15/2021  ?Medication  ? influenza  inactive virus vaccine (FLUZONE/FLUARIX) injection 0.5 mL  ? ?Ned Clines CMA ?Clinical Pharmacist Assistant ?[phone] ?

## 2021-12-16 ENCOUNTER — Ambulatory Visit (INDEPENDENT_AMBULATORY_CARE_PROVIDER_SITE_OTHER): Payer: Medicare Other | Admitting: Pharmacist

## 2021-12-16 DIAGNOSIS — I10 Essential (primary) hypertension: Secondary | ICD-10-CM

## 2021-12-16 DIAGNOSIS — E785 Hyperlipidemia, unspecified: Secondary | ICD-10-CM

## 2021-12-16 NOTE — Patient Instructions (Addendum)
Hi Trew, ? ?It was great to catch up with you again! ? ?Please reach out to me if you have any questions or need anything before our follow up! ? ?Best, ?Maddie ? ?Jeni Salles, PharmD, BCACP ?Clinical Pharmacist ?[place] ?[phone] ? ? Visit Information ? ? Goals Addressed   ?None ?  ? ?Patient Care Plan: [place]  ?  ? ?Problem Identified: Problem: Hypertension, Hyperlipidemia, Atrial Fibrillation, Coronary Artery Disease, GERD, Chronic Kidney Disease, Osteoarthritis, BPH, and Gout   ?  ? ?Long-Range Goal: Patient-Specific Goal   ?Start Date: 07/01/2021  ?Expected End Date: 07/01/2021  ?Recent Progress: On track  ?Priority: High  ?Note:   ?Current Barriers:  ?Unable to independently monitor therapeutic efficacy ? ?Pharmacist Clinical Goal(s):  ?Patient will achieve adherence to monitoring guidelines and medication adherence to achieve therapeutic efficacy through collaboration with PharmD and provider.  ? ?Interventions: ?1:1 collaboration with [name], MD regarding development and update of comprehensive plan of care as evidenced by provider attestation and co-signature ?Inter-disciplinary care team collaboration (see longitudinal plan of care) ?Comprehensive medication review performed; medication list updated in electronic medical record ? ?Hypertension (BP goal <140/90) ?-Not ideally controlled ?-Current treatment: ?Carvedilol 3.125 mg 1 tablet twice daily - Appropriate, Effective, Safe, Accessible ?Doxazosin 4 mg 1 tablet at bedtime - Appropriate, Effective, Query Safe, Accessible ?-Medications previously tried: n/a  ?-Current home readings: 128 -130/80 (checking once or twice a week) ?-Current dietary habits: he has never been a salt person - only on tomato and sandwiches ?-Current exercise habits: no structured exercise ?-Denies hypotensive/hypertensive symptoms ?-Educated on Importance of home blood pressure monitoring; ?Proper BP monitoring technique; ?Symptoms  of hypotension and importance of maintaining adequate hydration; ?-Counseled to monitor BP at home weekly, document, and provide log at future appointments ?-Recommended to continue current medication ?Counseled on taking carvedilol with food. ? ?Hyperlipidemia: (LDL goal < 70) ?-Controlled ?-Current treatment: ?Simvastatin 20 mg 1 tablet daily at bedtime - Appropriate, Effective, Safe, Accessible ?-Medications previously tried: none  ?-Current dietary patterns: eats fish once a week; doesn't eat oatmeal ?-Current exercise habits: no structured exercise ?-Educated on Cholesterol goals;  ?Benefits of statin for ASCVD risk reduction; ?-Counseled on diet and exercise extensively ?Recommended to continue current medication ? ?Atrial Fibrillation (Goal: prevent stroke and major bleeding) ?-Controlled ?-CHADSVASC: 4 ?-Current treatment: ?Rate control: Carvedilol 3.125 mg 1 tablet twice daily - Appropriate, Effective, Safe, Accessible ?Anticoagulation: warfarin 2.5 mg tablet as directed by coumadin clinic - Appropriate, Effective, Safe, Accessible ?-Medications previously tried: none ?-Home BP and HR readings: does not check  ?-Counseled on increased risk of stroke due to Afib and benefits of anticoagulation for stroke prevention; ?bleeding risk associated with warfarin and importance of self-monitoring for signs/symptoms of bleeding; ?avoidance of NSAIDs due to increased bleeding risk with anticoagulants; ?-Recommended to continue current medication ? ?CAD (Goal: prevent heart events) ?-Controlled ?-Current treatment  ?Warfarin 2.5 mg tablet as directed by coumadin clinic - Appropriate, Effective, Safe, Accessible ?Simvastatin 20 mg 1 tablet daily - Appropriate, Effective, Safe, Accessible ?-Medications previously tried: none  ?-Recommended to continue current medication ? ?Gout (Goal: uric acid < 6 and prevent flare ups) ?-Controlled ?-Current treatment  ?Allopurinol 100 mg 1 tablet daily - Appropriate, Effective, Safe,  Accessible ?-Medications previously tried: none  ?-Counseled on foods/beverages that can increase uric acid levels and increase risk for gout. ? ?BPH (Goal: minimize symptoms of enlarged prostate) ?-Not ideally controlled ?-Current treatment  ?Doxazosin 4 mg 1 tablet at bedtime -  Appropriate, Query effective, Query Safe, Accessible ?Finasteride 5 mg 1 tablet daily - Appropriate, Query effective, Safe, Accessible ?-Medications previously tried: none  ?-Recommended cutting back on caffeine intake to limit frequent bathroom trips. ?Educated on availability of another medication tamsulosin to replace the doxazosin. ? ?Health Maintenance ?-Vaccine gaps: shingrix, COVID booster ?-Current therapy:  ?Vitamin D3 2000 units daily  ?Latanoprost 0.005% 1 drop in both eyes at bedtime ?Areds 2 1 tablet twice daily ?Acetaminophen 500 mg as needed ?-Educated on Cost vs benefit of each product must be carefully weighed by individual consumer ?-Patient is satisfied with current therapy and denies issues ?-Recommended to continue current medication ? ?Patient Goals/Self-Care Activities ?Patient will:  ?- take medications as prescribed ?check blood pressure weekly, document, and provide at future appointments ? ?Follow Up Plan: Telephone follow up appointment with care management team member scheduled for: 6 months ? ?  ?  ? ?Patient verbalizes understanding of instructions and care plan provided today and agrees to view in [place]. Active MyChart status confirmed with patient.   ?Telephone follow up appointment with pharmacy team member scheduled for: ? ?Viona Gilmore, RPH  ?

## 2021-12-16 NOTE — Progress Notes (Signed)
? ? ?Chronic Care Management ?Pharmacy Note ? ?12/16/2021 ?Name:  [name]. MRN:  [id number] DOB:  26-Mar-1931 ? ?Summary: ?BP is at goal < 140/90 in office and at home ?LDL at goal < 70 ?Pt doesn't feel like the prostate medications are helping ? ?Recommendations/Changes made from today's visit: ?-Recommended youtube videos for balance exercises (yoga) ?-Recommended moving finasteride to bedtime ?-Recommended switching doxazosin to tamsulosin for prostate specificity and less side effects ? ?Plan: ?Follow up BP assessment in 3 months ?Follow up in 6 months ? ?Subjective: ?[name]. is an 88 y.o. year old male who is a primary patient of [employer], Elyse Jarvis, MD.  The CCM team was consulted for assistance with disease management and care coordination needs.   ? ?Engaged with patient by telephone for follow up visit in response to provider referral for pharmacy case management and/or care coordination services.  ? ?Consent to Services:  ?The patient was given information about Chronic Care Management services, agreed to services, and gave verbal consent prior to initiation of services.  Please see initial visit note for detailed documentation.  ? ?Patient Care Team: ?[name], MD as PCP - General (Family Medicine) ?Evans Lance, MD as PCP - Cardiology (Cardiology) ?Viona Gilmore, [facility] as Pharmacist (Pharmacist) ? ?Recent office visits: ?07/22/21 [name], MD - Clinical Support note for Muscular deconditioning and other concerns. No medication changes. ? ?Recent consult visits: ?12/07/21 Johny Shock, RN (Cardiology): Patient presented for anti-coag visit. INR 2.7, goal 2-3. Continue 3.75 mg every Monday and 2.5 mg all other days. Recheck in 8 weeks. ?  ?01/28/21 Jarome Matin MD (Dermatology) - seen for actinic keratosis and other skin related issues. Skin biopsies performed in office. No medication changes or follow up noted.  ? ?Hospital visits: ?None in previous 6  months ? ? ?Objective: ? ?Lab Results  ?Component Value Date  ? CREATININE 1.48 (H) 12/31/2020  ? BUN 21 12/31/2020  ? GFRNONAA 46 (L) 04/20/2020  ? GFRAA 54 (L) 04/20/2020  ? NA 143 12/31/2020  ? K 4.9 12/31/2020  ? CALCIUM 9.4 12/31/2020  ? CO2 23 12/31/2020  ? GLUCOSE 98 12/31/2020  ? ? ?No results found for: HGBA1C, FRUCTOSAMINE, GFR, MICROALBUR  ?Last diabetic Eye exam: No results found for: HMDIABEYEEXA  ?Last diabetic Foot exam: No results found for: HMDIABFOOTEX  ? ?Lab Results  ?Component Value Date  ? CHOL 118 12/31/2020  ? HDL 34 (L) 12/31/2020  ? [place] 61 12/31/2020  ? TRIG 125 12/31/2020  ? CHOLHDL 3.5 12/31/2020  ? ? ? ?  Latest Ref Rng & Units 12/31/2020  ?  2:11 PM 04/20/2020  ?  1:53 PM 08/07/2019  ?  9:44 AM  ?Hepatic Function  ?Total Protein 6.0 - 8.5 g/dL 6.5   6.7   6.0    ?Albumin 3.6 - 4.6 g/dL 4.3   4.3   4.0    ?AST 0 - 40 IU/L $Remov'21   22   19    'XnYlSt$ ?ALT 0 - 44 IU/L $Remov'13   12   14    'OAgaHK$ ?Alk Phosphatase 44 - 121 IU/L 96   92   94    ?Total Bilirubin 0.0 - 1.2 mg/dL 0.9   0.9   0.8    ? ? ?No results found for: TSH, FREET4 ? ? ?  Latest Ref Rng & Units 12/31/2020  ?  2:11 PM 04/20/2020  ?  1:53 PM 05/14/2019  ?  3:16  PM  ?CBC  ?WBC 3.4 - 10.8 x10E3/uL 5.7   7.0   9.7    ?Hemoglobin 13.0 - 17.7 g/dL 13.5   13.6   11.2    ?Hematocrit 37.5 - 51.0 % 41.8   43.4   35.8    ?Platelets 150 - 450 x10E3/uL 134   147   175    ? ? ?No results found for: VD25OH ? ?Clinical ASCVD: Yes  ?The ASCVD Risk score (Arnett DK, et al., 2019) failed to calculate for the following reasons: ?  The 2019 ASCVD risk score is only valid for ages 86 to 65   ? ? ?  12/31/2020  ?  1:31 PM 04/20/2020  ? 11:30 AM 03/21/2019  ?  9:28 AM  ?Depression screen PHQ 2/9  ?Decreased Interest 0 0 0  ?Down, Depressed, Hopeless 0 0 0  ?PHQ - 2 Score 0 0 0  ?  ?CHA2DS2/VAS Stroke Risk Points  Current as of 12 minutes ago  ?   4 >= 2 Points: High Risk  ?1 - 1.99 Points: Medium Risk  ?0 Points: Low Risk  ?  Last Change: N/A   ?  ? Details   ? This score  determines the patient's risk of having a stroke if the  ?patient has atrial fibrillation.  ?  ?  ? Points Metrics  ?0 Has Congestive Heart Failure:  No   ? Current as of 12 minutes ago  ?1 Has Vascular Disease:  Yes   ? Current as of 12 minutes ago  ?1 Has Hypertension:  Yes   ? Current as of 12 minutes ago  ?2 Age:  88   ? Current as of 12 minutes ago  ?0 Has Diabetes:  No   ? Current as of 12 minutes ago  ?0 Had Stroke:  No  Had TIA:  No  Had Thromboembolism:  No   ? Current as of 12 minutes ago  ?0 Male:  No   ? Current as of 12 minutes ago  ? ? ?Social History  ? ?Tobacco Use  ?Smoking Status Former  ? Packs/day: 1.00  ? Years: 10.00  ? Pack years: 10.00  ? Types: Cigarettes  ? Quit date: 09/13/1979  ? Years since quitting: 42.2  ?Smokeless Tobacco Never  ? ?BP Readings from Last 3 Encounters:  ?07/22/21 140/80  ?12/31/20 (!) 166/76  ?11/03/20 140/86  ? ?Pulse Readings from Last 3 Encounters:  ?07/22/21 67  ?12/31/20 75  ?11/03/20 70  ? ?Wt Readings from Last 3 Encounters:  ?07/22/21 184 lb 12.8 oz (83.8 kg)  ?12/31/20 189 lb 12.8 oz (86.1 kg)  ?11/03/20 188 lb 3.2 oz (85.4 kg)  ? ?BMI Readings from Last 3 Encounters:  ?07/22/21 30.99 kg/m?  ?12/31/20 31.83 kg/m?  ?11/03/20 31.32 kg/m?  ? ? ?Assessment/Interventions: Review of patient past medical history, allergies, medications, health status, including review of consultants reports, laboratory and other test data, was performed as part of comprehensive evaluation and provision of chronic care management services.  ? ?SDOH:  (Social Determinants of Health) assessments and interventions performed: No ? ? ?SDOH Screenings  ? ?Alcohol Screen: Not on file  ?Depression (PHQ2-9): Low Risk   ? PHQ-2 Score: 0  ?Financial Resource Strain: Low Risk   ? Difficulty of Paying Living Expenses: Not hard at all  ?Food Insecurity: Not on file  ?Housing: Not on file  ?Physical Activity: Not on file  ?Social Connections: Not on file  ?Stress: Not on file  ?  Tobacco Use: Medium  Risk  ? Smoking Tobacco Use: Former  ? Smokeless Tobacco Use: Never  ? Passive Exposure: Not on file  ?Transportation Needs: No Transportation Needs  ? Lack of Transportation (Medical): No  ? Lack of Transportation (Non-Medical): No  ? ?Patient has been a patient of Dr. Lanice Shirts since before he was  ? ?CCM Care Plan ? ?Allergies  ?Allergen Reactions  ? Tramadol Itching  ? ? ?Medications Reviewed Today   ? ? Reviewed by Viona Gilmore, [place] (Pharmacist) on 12/16/21 at [phone]  Med List Status: <None>  ? ?Medication Order Taking? Sig Documenting Provider Last Dose Status Informant  ?acetaminophen (TYLENOL) 500 MG tablet [id number]  Take 500 mg by mouth every 6 (six) hours as needed (for pain).  [provider]  Active Self  ?allopurinol (ZYLOPRIM) 100 MG tablet [id number]  TAKE 1 TABLET BY MOUTH EVERY DAY [name], MD  Active   ?carvedilol (COREG) 3.125 MG tablet [id number]  TAKE 1 TABLET BY MOUTH  TWICE DAILY Evans Lance, MD  Active   ?Cholecalciferol (VITAMIN D3) 1000 units CAPS [id number]  Take 2,000 Units by mouth daily. [provider]  Active Self  ?         ?Med Note Duffy Bruce, Ardelia Mems Sep 17, 2018  6:17 PM)    ?doxazosin (CARDURA) 4 MG tablet [id number] Yes Take 1 tablet (4 mg total) by mouth at bedtime. Please keep upcoming appt in April 2023 with Dr. Lovena Le before anymore refills. Thank you Final Attempt Evans Lance, MD Taking Active   ?finasteride (PROSCAR) 5 MG tablet [id number] Yes TAKE 1 TABLET BY MOUTH EVERY DAY [name], MD Taking Active   ?latanoprost (XALATAN) 0.005 % ophthalmic solution [id number]  Place 1 drop into both eyes at bedtime.  [provider]  Active Self  ?Multiple Vitamins-Minerals (PRESERVISION AREDS 2 PO) [id number]  Take 1 capsule by mouth 2 (two) times daily.  [provider]  Active Self  ?         ?Med Note Duffy Bruce, Ardelia Mems Sep 17, 2018  6:20 PM)    ?  Discontinued 12/16/21 3329 (Completed Course)   ?simvastatin (ZOCOR) 20 MG  tablet [id number]  TAKE 1 TABLET BY MOUTH AT  BEDTIME [name], MD  Active   ?warfarin (COUMADIN) 2.5 MG tablet [id number]  TAKE 1 TO 1 AND 1/2 TABLETS BY MOUTH DAILY AS DIRECTED  BY THE COUMADIN CLINIC Evans Lance, MD  Activ

## 2022-01-03 ENCOUNTER — Encounter: Payer: Self-pay | Admitting: Family Medicine

## 2022-01-03 ENCOUNTER — Ambulatory Visit (INDEPENDENT_AMBULATORY_CARE_PROVIDER_SITE_OTHER): Payer: Medicare Other | Admitting: Family Medicine

## 2022-01-03 VITALS — BP 130/80 | HR 70 | Temp 96.8°F | Ht 64.0 in | Wt 192.2 lb

## 2022-01-03 DIAGNOSIS — R3911 Hesitancy of micturition: Secondary | ICD-10-CM

## 2022-01-03 DIAGNOSIS — Z950 Presence of cardiac pacemaker: Secondary | ICD-10-CM | POA: Diagnosis not present

## 2022-01-03 DIAGNOSIS — I700 Atherosclerosis of aorta: Secondary | ICD-10-CM

## 2022-01-03 DIAGNOSIS — Z0000 Encounter for general adult medical examination without abnormal findings: Secondary | ICD-10-CM | POA: Diagnosis not present

## 2022-01-03 DIAGNOSIS — Z7901 Long term (current) use of anticoagulants: Secondary | ICD-10-CM

## 2022-01-03 DIAGNOSIS — I10 Essential (primary) hypertension: Secondary | ICD-10-CM | POA: Diagnosis not present

## 2022-01-03 DIAGNOSIS — N401 Enlarged prostate with lower urinary tract symptoms: Secondary | ICD-10-CM

## 2022-01-03 DIAGNOSIS — M109 Gout, unspecified: Secondary | ICD-10-CM | POA: Diagnosis not present

## 2022-01-03 DIAGNOSIS — N1831 Chronic kidney disease, stage 3a: Secondary | ICD-10-CM

## 2022-01-03 DIAGNOSIS — R350 Frequency of micturition: Secondary | ICD-10-CM

## 2022-01-03 DIAGNOSIS — Z96651 Presence of right artificial knee joint: Secondary | ICD-10-CM | POA: Diagnosis not present

## 2022-01-03 DIAGNOSIS — E785 Hyperlipidemia, unspecified: Secondary | ICD-10-CM

## 2022-01-03 DIAGNOSIS — Z23 Encounter for immunization: Secondary | ICD-10-CM | POA: Diagnosis not present

## 2022-01-03 MED ORDER — ALLOPURINOL 100 MG PO TABS: 100.0000 mg | ORAL_TABLET | Freq: Every day | ORAL | 3 refills | Status: DC

## 2022-01-03 MED ORDER — FINASTERIDE 5 MG PO TABS: 5.0000 mg | ORAL_TABLET | Freq: Every day | ORAL | 3 refills | Status: DC

## 2022-01-03 MED ORDER — TAMSULOSIN HCL 0.4 MG PO CAPS: 0.4000 mg | ORAL_CAPSULE | Freq: Every day | ORAL | 3 refills | Status: DC

## 2022-01-03 NOTE — Progress Notes (Signed)
[name]. is a 88 y.o. male who presents for annual wellness visit and follow-up on chronic medical conditions.  He has underlying atrial fib and has a pacer and is followed regularly by cardiology.  He continues on Coumadin and is having no difficulty with that.  He does have BPH with nocturia 2 and presently is on finasteride as well as Cardura.  He has had both knees replaced and does stay active but not aggressively so.  He does have gout and is doing well on the allopurinol 100 mg.  His renal function is diminished.  Continues on simvastatin and is having no difficulty with that.  He does have a history of aortic atherosclerosis.  He does live with his daughter which seems to be going well. ? ? ?Immunizations and Health Maintenance ?Immunization History  ?Administered Date(s) Administered  ? Fluad Quad(high Dose 65+) 05/28/2019  ? Influenza Split 07/26/2011, 06/25/2012  ? Influenza Whole 08/08/2007, 06/11/2008, 06/01/2010  ? Influenza, High Dose Seasonal PF 06/18/2013, 06/06/2014, 05/19/2015, 05/31/2016, 05/16/2017, 06/08/2018, 06/24/2020  ? Influenza-Unspecified 06/21/2021  ? PFIZER Comirnaty(Gray Top)Covid-19 Tri-Sucrose Vaccine 12/31/2020  ? PFIZER(Purple Top)SARS-COV-2 Vaccination 10/07/2019, 10/28/2019, 06/22/2020  ? Pension scheme manager 71yr & up 06/21/2021  ? Pneumococcal Conjugate-13 06/25/2015  ? Pneumococcal Polysaccharide-23 01/03/1998, 06/11/2008  ? Tdap 09/10/2008, 05/16/2017  ? ?There are no preventive care reminders to display for this patient. ? ?Last colonoscopy:aged out 11/18/08 ?Last PSA: N/ A ?Dentist: Q year ?Ophtho: Q six months  ?Exercise: N/A ? ?Other doctors caring for patient include:  Dr. TLovena Lecardiology ?                       Dr. OAlvan Dame? ?Advanced Directives: ?Does Patient Have a Medical Advance Directive?: Yes ?Type of Advance Directive: Living will, Healthcare Power of Attorney ?Does patient want to make changes to medical advance directive?: No -  Patient declined ?[place] Yes - validated most recent copy scanned in chart (See row information) ? ?Depression screen:  See questionnaire below.   ?  ? ?  01/03/2022  ?  1:45 PM 12/31/2020  ?  1:31 PM 04/20/2020  ? 11:30 AM 03/21/2019  ?  9:28 AM 01/12/2017  ?  8:55 AM  ?Depression screen PHQ 2/9  ?Decreased Interest 0 0 0 0 0  ?Down, Depressed, Hopeless 0 0 0 0 0  ?PHQ - 2 Score 0 0 0 0 0  ? ? ?Fall Screen: See Questionaire below. ?  ? ?  01/03/2022  ?  1:44 PM 12/31/2020  ?  1:31 PM 04/20/2020  ? 11:30 AM 03/21/2019  ?  9:27 AM 01/26/2018  ?  1:16 PM  ?Fall Risk   ?Falls in the past year? 1 0 0 1 No  ?Number falls in past yr: 0 0     ?Injury with Fall? 0 0  0   ?Risk for fall due to : No Fall Risks No Fall Risks Impaired balance/gait    ?Follow up Falls evaluation completed Falls evaluation completed     ? ? ?ADL screen:  See questionnaire below.  ?Functional Status Survey: ?Is the patient deaf or have difficulty hearing?: No ?Does the patient have difficulty seeing, even when wearing glasses/contacts?: No ?Does the patient have difficulty concentrating, remembering, or making decisions?: No ?Does the patient have difficulty walking or climbing stairs?: Yes ?Does the patient have difficulty dressing or bathing?: No ?Does the patient have difficulty  doing errands alone such as visiting a doctor's office or shopping?: No (family helps) ? ? ?Review of Systems ? ?Constitutional: -, -unexpected weight change, -anorexia, -fatigue ?Allergy: -sneezing, -itching, -congestion ?Dermatology: denies changing moles, rash, lumps ?ENT: -runny nose, -ear pain, -sore throat,  ?Cardiology:  -chest pain, -palpitations, -orthopnea, ?Respiratory: -cough, -shortness of breath, -dyspnea on exertion, -wheezing,  ?Gastroenterology: -abdominal pain, -nausea, -vomiting, -diarrhea, -constipation, -dysphagia ?Hematology: -bleeding or bruising problems ?Musculoskeletal: -arthralgias, -myalgias, -joint swelling, -back  pain, - ?Ophthalmology: -vision changes,  ?Urology: -dysuria, -difficulty urinating,  -urinary frequency, -urgency, incontinence ?Neurology: -, -numbness, , -memory loss, -falls, -dizziness ? ?PHYSICAL EXAM: ?General Appearance: Alert, cooperative, no distress, appears stated age ?Head: Normocephalic, without obvious abnormality, atraumatic ?Eyes: PERRL, conjunctiva/corneas clear, EOM's intact, fundi benign ?Ears: Normal TM's and external ear canals ?Nose: Nares normal, mucosa normal, no drainage or sinus   tenderness ?Throat: Lips, mucosa, and tongue normal; teeth and gums normal ?Neck: Supple, no lymphadenopathy, thyroid:no enlargement/tenderness/nodules; no carotid bruit or JVD ?Lungs: Clear to auscultation bilaterally without wheezes, rales or ronchi; respirations unlabored ?Heart: Irregular rate S1 and S2 normal, 2/6 SEM,no rub or gallop ?Abdomen: Soft, non-tender, nondistended, normoactive bowel sounds, no masses, no hepatosplenomegaly ?Extremities: No clubbing, cyanosis or edema ?Pulses: 2+ and symmetric all extremities ?Skin: Skin color, texture, turgor normal, no rashes or lesions ?Lymph nodes: Cervical, supraclavicular, and axillary nodes normal ?Neurologic: CNII-XII intact, normal strength, sensation and gait; reflexes 2+ and symmetric throughout   ?Psych: Normal mood, affect, hygiene and grooming ? ?ASSESSMENT/PLAN: ?Primary hypertension ? ?Benign prostatic hyperplasia with urinary hesitancy ? ?Hyperlipidemia LDL goal <70 ? ?PACEMAKER-St.Jude ? ?Long term (current) use of anticoagulants ? ?Stage 3a chronic kidney disease ([place]) ? ?Gout involving toe, unspecified cause, unspecified chronicity, unspecified laterality ? ?Aortic atherosclerosis ([place]) ? ?Benign prostatic hyperplasia with urinary frequency ? ?S/P right TKA ?I will switch him to Flomax and stop the Cardura.  He will continue on his other medications.  He will follow-up regularly with cardiology.  He is to get the Shingrix in the near  future. ?Discussed  at least 30 minutes of aerobic activity at least 5 days/week;  Immunization recommendations discussed.   ?Medicare Attestation ?I have personally reviewed: ?The patient's medical and social history ?Their use of alcohol, tobacco or illicit drugs ?Their current medications and supplements ?The patient's functional ability including ADLs,fall risks, home safety risks, cognitive, and hearing and visual impairment ?Diet and physical activities ?Evidence for depression or mood disorders ? ?The patient's weight, height, and BMI have been recorded in the chart.  I have made referrals, counseling, and provided education to the patient based on review of the above and I have provided the patient with a written personalized care plan for preventive services.   ? ? ?Jill Alexanders, MD   01/03/2022  ? ? ?  ?

## 2022-01-03 NOTE — Patient Instructions (Signed)
?  Mr. Gamblin , ?Thank you for taking time to come for your Medicare Wellness Visit. I appreciate your ongoing commitment to your health goals. Please review the following plan we discussed and let me know if I can assist you in the future.  ? ?These are the goals we discussed: ? Goals   ? ?  Track and Manage My Blood Pressure-Hypertension   ?  Timeframe:  Long-Range Goal ?Priority:  Medium ?Start Date:                             ?Expected End Date:                      ? ?Follow Up Date 10/01/21  ?  ?- check blood pressure weekly ?- choose a place to take my blood pressure (home, clinic or office, retail store) ?- write blood pressure results in a log or diary  ?  ?Why is this important?   ?You won't feel high blood pressure, but it can still hurt your blood vessels.  ?High blood pressure can cause heart or kidney problems. It can also cause a stroke.  ?Making lifestyle changes like losing a little weight or eating less salt will help.  ?Checking your blood pressure at home and at different times of the day can help to control blood pressure.  ?If the doctor prescribes medicine remember to take it the way the doctor ordered.  ?Call the office if you cannot afford the medicine or if there are questions about it.   ?  ?Notes:  ?  ? ?  ?  ?This is a list of the screening recommended for you and due dates:  ?Health Maintenance  ?Topic Date Due  ? Zoster (Shingles) Vaccine (1 of 2) 02/24/2022*  ? Flu Shot  04/12/2022  ? Tetanus Vaccine  05/17/2027  ? Pneumonia Vaccine  Completed  ? COVID-19 Vaccine  Completed  ? HPV Vaccine  Aged Out  ?*Topic was postponed. The date shown is not the original due date.  ?  ?

## 2022-01-04 ENCOUNTER — Encounter: Payer: Self-pay | Admitting: Internal Medicine

## 2022-01-04 ENCOUNTER — Ambulatory Visit: Payer: Medicare Other | Admitting: Internal Medicine

## 2022-01-04 VITALS — BP 142/84 | HR 76 | Ht 64.0 in | Wt 191.8 lb

## 2022-01-04 DIAGNOSIS — R001 Bradycardia, unspecified: Secondary | ICD-10-CM

## 2022-01-04 DIAGNOSIS — I4821 Permanent atrial fibrillation: Secondary | ICD-10-CM | POA: Diagnosis not present

## 2022-01-04 DIAGNOSIS — Z23 Encounter for immunization: Secondary | ICD-10-CM | POA: Diagnosis not present

## 2022-01-04 DIAGNOSIS — Z950 Presence of cardiac pacemaker: Secondary | ICD-10-CM | POA: Diagnosis not present

## 2022-01-04 DIAGNOSIS — I10 Essential (primary) hypertension: Secondary | ICD-10-CM

## 2022-01-04 DIAGNOSIS — Z0000 Encounter for general adult medical examination without abnormal findings: Secondary | ICD-10-CM | POA: Diagnosis not present

## 2022-01-04 NOTE — Patient Instructions (Signed)
Medication Instructions:  ?Your physician recommends that you continue on your current medications as directed. Please refer to the Current Medication list given to you today. ? ?Labwork: ?None ordered. ? ?Testing/Procedures: ?None ordered. ? ?Follow-Up: ?Your physician wants you to follow-up in: one year with [name], MD or one of the following Advanced Practice Providers on your designated Care Team:   ?Tommye Standard, PA-C ?Legrand Como "[name]" [place], PA-C ? ?Remote monitoring is used to monitor your Pacemaker from home. This monitoring reduces the number of office visits required to check your device to one time per year. It allows [place] to keep an eye on the functioning of your device to ensure it is working properly. You are scheduled for a device check from home on 03/02/2022. You may send your transmission at any time that day. If you have a wireless device, the transmission will be sent automatically. After your physician reviews your transmission, you will receive a postcard with your next transmission date. ? ?Any Other Special Instructions Will Be Listed Below (If Applicable). ? ?If you need a refill on your cardiac medications before your next appointment, please call your pharmacy.  ? ?Important Information About Sugar ? ? ? ? ? ? ? ?

## 2022-01-04 NOTE — Progress Notes (Signed)
? ? ? ? ?HPI ?[name] returns today for followup. He is a pleasant 88 yo man with chronic atrial fib, symptomatic bradycardia, s/p PPM insertion and recent episode of sob. He denies chest pain or sob. No syncope. No edema. He has been more sedentary. ?Allergies  ?Allergen Reactions  ? Tramadol Itching  ? ? ? ?Current Outpatient Medications  ?Medication Sig Dispense Refill  ? acetaminophen (TYLENOL) 500 MG tablet Take 500 mg by mouth every 6 (six) hours as needed (for pain).    ? allopurinol (ZYLOPRIM) 100 MG tablet Take 1 tablet (100 mg total) by mouth daily. 90 tablet 3  ? carvedilol (COREG) 3.125 MG tablet TAKE 1 TABLET BY MOUTH  TWICE DAILY 180 tablet 3  ? Cholecalciferol (VITAMIN D3) 1000 units CAPS Take 2,000 Units by mouth daily.    ? finasteride (PROSCAR) 5 MG tablet Take 1 tablet (5 mg total) by mouth daily. 90 tablet 3  ? latanoprost (XALATAN) 0.005 % ophthalmic solution Place 1 drop into both eyes at bedtime.   3  ? Multiple Vitamins-Minerals (PRESERVISION AREDS 2 PO) Take 1 capsule by mouth 2 (two) times daily.     ? simvastatin (ZOCOR) 20 MG tablet TAKE 1 TABLET BY MOUTH AT  BEDTIME 90 tablet 1  ? tamsulosin (FLOMAX) 0.4 MG CAPS capsule Take 1 capsule (0.4 mg total) by mouth daily. 90 capsule 3  ? warfarin (COUMADIN) 2.5 MG tablet TAKE 1 TO 1 AND 1/2 TABLETS BY MOUTH DAILY AS DIRECTED  BY THE COUMADIN CLINIC 90 tablet 5  ? ?No current facility-administered medications for this visit.  ? ?Facility-Administered Medications Ordered in Other Visits  ?Medication Dose Route Frequency Provider Last Rate Last Admin  ? influenza  inactive virus vaccine (FLUZONE/FLUARIX) injection 0.5 mL  0.5 mL Intramuscular Once Rita Ohara, MD      ? ? ? ?Past Medical History:  ?Diagnosis Date  ? A-fib ([place])   ? Arthritis   ? CAD (coronary artery disease)   ? GXT, neg bruce protocol GXT  ? Chronic kidney disease   ? RENAL INSUFFICIENCY  ? Diverticulosis   ? ED (erectile dysfunction)   ? GERD (gastroesophageal reflux disease)    ? Glaucoma   ? Gout   ? no flare in years   ? Heart disorder   ? Hypertension   ? Obesity   ? ? ?ROS: ? ? All systems reviewed and negative except as noted in the HPI. ? ? ?Past Surgical History:  ?Procedure Laterality Date  ? CATARACT EXTRACTION, BILATERAL  2018  ? with lens placement   ? PACEMAKER INSERTION  01/09/03  ? TONSILLECTOMY    ? TOTAL KNEE ARTHROPLASTY Right 02/27/2018  ? Procedure: RIGHT TOTAL KNEE ARTHROPLASTY;  Surgeon: Paralee Cancel, MD;  Location: [facility];  Service: Orthopedics;  Laterality: Right;  70 mins  ? TOTAL KNEE ARTHROPLASTY Left 09/06/2018  ? Procedure: LEFT TOTAL KNEE ARTHROPLASTY;  Surgeon: Paralee Cancel, MD;  Location: [facility];  Service: Orthopedics;  Laterality: Left;  70 mins  ? ? ? ?Family History  ?Problem Relation Age of Onset  ? Heart disease Mother   ? Hypertension Mother   ? Kidney disease Mother   ? Stroke Mother   ? Heart disease Father   ? ? ? ?Social History  ? ?Socioeconomic History  ? Marital status: Widowed  ?  Spouse name: Not on file  ? Number of children: 2  ? Years of education: [employer]  ? Highest education level: Not on  file  ?Occupational History  ?  Comment: Works part time  ?Tobacco Use  ? Smoking status: Former  ?  Packs/day: 1.00  ?  Years: 10.00  ?  Pack years: 10.00  ?  Types: Cigarettes  ?  Quit date: 09/13/1979  ?  Years since quitting: 42.3  ? Smokeless tobacco: Never  ?Vaping Use  ? Vaping Use: Never used  ?Substance and Sexual Activity  ? Alcohol use: Yes  ?  Comment: Consumes 2-3 glasses of wine per week; now only socially   ? Drug use: No  ? Sexual activity: Not on file  ?Other Topics Concern  ? Not on file  ?Social History Narrative  ? Not on file  ? ?Social Determinants of Health  ? ?Financial Resource Strain: Low Risk   ? Difficulty of Paying Living Expenses: Not hard at all  ?Food Insecurity: Not on file  ?Transportation Needs: No Transportation Needs  ? Lack of Transportation (Medical): No  ? Lack of Transportation (Non-Medical): No  ?Physical  Activity: Not on file  ?Stress: Not on file  ?Social Connections: Not on file  ?Intimate Partner Violence: Not on file  ? ? ? ?BP (!) 142/84   Pulse 76   Ht '5\' 4"'$  (1.626 m)   Wt 191 lb 12.8 oz (87 kg)   SpO2 96%   BMI 32.92 kg/m?  ? ?Physical Exam: ? ?Well appearing NAD ?HEENT: Unremarkable ?Neck:  No JVD, no thyromegally ?Lymphatics:  No adenopathy ?Back:  No CVA tenderness ?Lungs:  Clear ?HEART:  Regular rate rhythm, no murmurs, no rubs, no clicks ?Abd:  soft, positive bowel sounds, no organomegally, no rebound, no guarding ?Ext:  2 plus pulses, no edema, no cyanosis, no clubbing ?Skin:  No rashes no nodules ?Neuro:  CN II through XII intact, motor grossly intact ? ?EKG - atrial fib with a controlled VR ? ?DEVICE  ?Normal device function.  See PaceArt for details.  ? ?Assess/Plan:  ?Syncope - he has not had any since his PPM insertion. ?PPM - his St. Jude device is working normally. He is about 14 months from ERI. ?Atrial fib - his VR is well controlled. No change in meds. ?Coags - he is tolerating coumadin with no bleeding. ? ?Carleene Overlie Sheamus Hasting,MD ?

## 2022-01-05 LAB — COMPREHENSIVE METABOLIC PANEL
ALT: 12 IU/L (ref 0–44)
AST: 19 IU/L (ref 0–40)
Albumin/Globulin Ratio: 2 (ref 1.2–2.2)
Albumin: 4.3 g/dL (ref 3.6–4.6)
Alkaline Phosphatase: 92 IU/L (ref 44–121)
BUN/Creatinine Ratio: 15 (ref 10–24)
BUN: 20 mg/dL (ref 8–27)
Bilirubin Total: 0.9 mg/dL (ref 0.0–1.2)
CO2: 24 mmol/L (ref 20–29)
Calcium: 9.9 mg/dL (ref 8.6–10.2)
Chloride: 106 mmol/L (ref 96–106)
Creatinine, Ser: 1.36 mg/dL — ABNORMAL HIGH (ref 0.76–1.27)
Globulin, Total: 2.1 g/dL (ref 1.5–4.5)
Glucose: 88 mg/dL (ref 70–99)
Potassium: 5.1 mmol/L (ref 3.5–5.2)
Sodium: 148 mmol/L — ABNORMAL HIGH (ref 134–144)
Total Protein: 6.4 g/dL (ref 6.0–8.5)
eGFR: 50 mL/min/{1.73_m2} — ABNORMAL LOW (ref >59)

## 2022-01-05 LAB — CBC WITH DIFFERENTIAL/PLATELET
Basophils Absolute: 0.1 10*3/uL (ref 0.0–0.2)
Basos: 1 %
EOS (ABSOLUTE): 0.6 10*3/uL — ABNORMAL HIGH (ref 0.0–0.4)
Eos: 11 %
Hematocrit: 37.3 % — ABNORMAL LOW (ref 37.5–51.0)
Hemoglobin: 11.9 g/dL — ABNORMAL LOW (ref 13.0–17.7)
Immature Grans (Abs): 0 10*3/uL (ref 0.0–0.1)
Immature Granulocytes: 0 %
Lymphocytes Absolute: 0.9 10*3/uL (ref 0.7–3.1)
Lymphs: 16 %
MCH: 30 pg (ref 26.6–33.0)
MCHC: 31.9 g/dL (ref 31.5–35.7)
MCV: 94 fL (ref 79–97)
Monocytes Absolute: 0.5 10*3/uL (ref 0.1–0.9)
Monocytes: 9 %
Neutrophils Absolute: 3.4 10*3/uL (ref 1.4–7.0)
Neutrophils: 63 %
Platelets: 135 10*3/uL — ABNORMAL LOW (ref 150–450)
RBC: 3.97 x10E6/uL — ABNORMAL LOW (ref 4.14–5.80)
RDW: 13.7 % (ref 11.6–15.4)
WBC: 5.4 10*3/uL (ref 3.4–10.8)

## 2022-01-05 LAB — LIPID PANEL
Chol/HDL Ratio: 2.8 ratio (ref 0.0–5.0)
Cholesterol, Total: 106 mg/dL (ref 100–199)
HDL: 38 mg/dL — ABNORMAL LOW (ref >39)
LDL Chol Calc (NIH): 52 mg/dL (ref 0–99)
Triglycerides: 81 mg/dL (ref 0–149)
VLDL Cholesterol Cal: 16 mg/dL (ref 5–40)

## 2022-01-05 LAB — URIC ACID: Uric Acid: 4.5 mg/dL (ref 3.8–8.4)

## 2022-01-09 DIAGNOSIS — I10 Essential (primary) hypertension: Secondary | ICD-10-CM

## 2022-01-09 DIAGNOSIS — E785 Hyperlipidemia, unspecified: Secondary | ICD-10-CM | POA: Diagnosis not present

## 2022-02-01 ENCOUNTER — Other Ambulatory Visit: Payer: Self-pay | Admitting: Family Medicine

## 2022-02-03 ENCOUNTER — Ambulatory Visit: Payer: Medicare Other | Admitting: Pharmacist Clinician (PhC)/ Clinical Pharmacy Specialist

## 2022-02-03 DIAGNOSIS — Z7901 Long term (current) use of anticoagulants: Secondary | ICD-10-CM | POA: Diagnosis not present

## 2022-02-03 DIAGNOSIS — I4821 Permanent atrial fibrillation: Secondary | ICD-10-CM

## 2022-02-03 LAB — POCT INR: INR: 3.4 — AB (ref 2.0–3.0)

## 2022-02-05 ENCOUNTER — Other Ambulatory Visit: Payer: Self-pay | Admitting: Internal Medicine

## 2022-02-22 ENCOUNTER — Other Ambulatory Visit: Payer: Self-pay | Admitting: Family Medicine

## 2022-02-22 DIAGNOSIS — N401 Enlarged prostate with lower urinary tract symptoms: Secondary | ICD-10-CM

## 2022-02-24 ENCOUNTER — Encounter: Payer: Self-pay | Admitting: Family Medicine

## 2022-02-28 ENCOUNTER — Other Ambulatory Visit: Payer: Self-pay | Admitting: Internal Medicine

## 2022-02-28 DIAGNOSIS — I4821 Permanent atrial fibrillation: Secondary | ICD-10-CM

## 2022-03-02 ENCOUNTER — Ambulatory Visit (INDEPENDENT_AMBULATORY_CARE_PROVIDER_SITE_OTHER): Payer: Medicare Other

## 2022-03-02 DIAGNOSIS — Z950 Presence of cardiac pacemaker: Secondary | ICD-10-CM

## 2022-03-03 ENCOUNTER — Ambulatory Visit: Payer: Medicare Other

## 2022-03-03 DIAGNOSIS — Z7901 Long term (current) use of anticoagulants: Secondary | ICD-10-CM | POA: Diagnosis not present

## 2022-03-03 DIAGNOSIS — I4821 Permanent atrial fibrillation: Secondary | ICD-10-CM | POA: Diagnosis not present

## 2022-03-03 LAB — CUP PACEART REMOTE DEVICE CHECK
Battery Remaining Longevity: 14 mo
Battery Remaining Percentage: 12 %
Battery Voltage: 2.86 V
Brady Statistic RV Percent Paced: 49 %
Date Time Interrogation Session: 20230621085730
Implantable Lead Implant Date: 20040429
Implantable Lead Location: 753860
Implantable Pulse Generator Implant Date: 20110325
Lead Channel Impedance Value: 430 Ohm
Lead Channel Pacing Threshold Amplitude: 0.75 V
Lead Channel Pacing Threshold Pulse Width: 0.9 ms
Lead Channel Sensing Intrinsic Amplitude: 1.3 mV
Lead Channel Setting Pacing Amplitude: 2.5 V
Lead Channel Setting Pacing Pulse Width: 0.9 ms
Lead Channel Setting Sensing Sensitivity: 0.7 mV
Pulse Gen Model: 1110
Pulse Gen Serial Number: 2313421

## 2022-03-03 LAB — POCT INR: INR: 2.1 (ref 2.0–3.0)

## 2022-03-03 NOTE — Patient Instructions (Signed)
Description   Continue taking 2.5 mg daily except 3.75 mg each Monday.   Recheck INR In 5 weeks [facility]in [facility] [phone]

## 2022-03-11 NOTE — Progress Notes (Signed)
Remote pacemaker transmission.   

## 2022-03-21 ENCOUNTER — Telehealth: Payer: Self-pay | Admitting: Pharmacist

## 2022-03-21 NOTE — Chronic Care Management (AMB) (Signed)
Chronic Care Management Pharmacy Assistant   Name: [name].  MRN: [id number] DOB: March 14, 1931  Reason for Encounter: Disease State   Conditions to be addressed/monitored: HTN  Recent office visits:  01/03/22 [name], MD - Patient presented for Primary hypertension and other concerns. Prescribed Tamsulosin. Stopped Doxazosin Mesylate.  Recent consult visits:  03/03/22 [name], RN - Patient presented for Anti Coag Visit reading was 2.1.  02/03/22 Rockne Menghini, RPH-PCP - - Patient presented for Anti Coag Visit reading was 3.4.  01/04/22 Evans Lance, MD (Cardiology) - Patient presented for Bradycardia and other concerns. No medication changes.  Hospital visits:  None in previous 6 months  Medications: Outpatient Encounter Medications as of 03/21/2022  Medication Sig   warfarin (COUMADIN) 2.5 MG tablet TAKE 1 TO 1 AND 1/2 TABLETS BY MOUTH DAILY AS DIRECTED  BY THE COUMADIN CLINIC   acetaminophen (TYLENOL) 500 MG tablet Take 500 mg by mouth every 6 (six) hours as needed (for pain).   allopurinol (ZYLOPRIM) 100 MG tablet Take 1 tablet (100 mg total) by mouth daily.   carvedilol (COREG) 3.125 MG tablet TAKE 1 TABLET BY MOUTH  TWICE DAILY   Cholecalciferol (VITAMIN D3) 1000 units CAPS Take 2,000 Units by mouth daily.   finasteride (PROSCAR) 5 MG tablet TAKE 1 TABLET BY MOUTH EVERY DAY   latanoprost (XALATAN) 0.005 % ophthalmic solution Place 1 drop into both eyes at bedtime.    Multiple Vitamins-Minerals (PRESERVISION AREDS 2 PO) Take 1 capsule by mouth 2 (two) times daily.    simvastatin (ZOCOR) 20 MG tablet TAKE 1 TABLET BY MOUTH AT  BEDTIME   tamsulosin (FLOMAX) 0.4 MG CAPS capsule Take 1 capsule (0.4 mg total) by mouth daily.   Facility-Administered Encounter Medications as of 03/21/2022  Medication   influenza  inactive virus vaccine (FLUZONE/FLUARIX) injection 0.5 mL   Reviewed chart prior to disease state call. Spoke with patient regarding  BP  Recent Office Vitals: BP Readings from Last 3 Encounters:  01/04/22 (!) 142/84  01/03/22 130/80  07/22/21 140/80   Pulse Readings from Last 3 Encounters:  01/04/22 76  01/03/22 70  07/22/21 67    Wt Readings from Last 3 Encounters:  01/04/22 191 lb 12.8 oz (87 kg)  01/03/22 192 lb 3.2 oz (87.2 kg)  07/22/21 184 lb 12.8 oz (83.8 kg)     Kidney Function Lab Results  Component Value Date/Time   CREATININE 1.36 (H) 01/03/2022 02:43 PM   CREATININE 1.48 (H) 12/31/2020 02:11 PM   CREATININE 1.45 (H) 01/12/2017 08:49 AM   CREATININE 1.51 (H) 06/16/2015 12:01 AM   GFRNONAA 46 (L) 04/20/2020 01:53 PM   GFRAA 54 (L) 04/20/2020 01:53 PM       Latest Ref Rng & Units 01/03/2022    2:43 PM 12/31/2020    2:11 PM 04/20/2020    1:53 PM  BMP  Glucose 70 - 99 mg/dL 88  98  81   BUN 8 - 27 mg/dL '20  21  22   '$ Creatinine 0.76 - 1.27 mg/dL 1.36  1.48  1.36   BUN/Creat Ratio 10 - '24 15  14  16   '$ Sodium 134 - 144 mmol/L 148  143  144   Potassium 3.5 - 5.2 mmol/L 5.1  4.9  5.4   Chloride 96 - 106 mmol/L 106  103  105   CO2 20 - 29 mmol/L '24  23  27   '$ Calcium 8.6 - 10.2 mg/dL  9.9  9.4  10.0     Current antihypertensive regimen:  Carvedilol 3.125 mg 1 tablet twice daily - Appropriate, Effective, Safe, Accessible Doxazosin 4 mg 1 tablet at bedtime - Appropriate, Effective, Query Safe, Accessible  Unable to reach for assessment   Care Gaps: Zoster Vaccine - Overdue BP- 130/80 01/03/22 AWV- 10/23 CCM- Need  Star Rating Drugs: Simvastatin (Zocor) 20 mg - Last filled 12/07/21 90 DS at [place] Pharmacist Assistant [phone]

## 2022-04-05 DIAGNOSIS — H353132 Nonexudative age-related macular degeneration, bilateral, intermediate dry stage: Secondary | ICD-10-CM | POA: Diagnosis not present

## 2022-04-05 DIAGNOSIS — H52203 Unspecified astigmatism, bilateral: Secondary | ICD-10-CM | POA: Diagnosis not present

## 2022-04-05 DIAGNOSIS — Z961 Presence of intraocular lens: Secondary | ICD-10-CM | POA: Diagnosis not present

## 2022-04-05 DIAGNOSIS — H401131 Primary open-angle glaucoma, bilateral, mild stage: Secondary | ICD-10-CM | POA: Diagnosis not present

## 2022-04-06 ENCOUNTER — Encounter: Payer: Self-pay | Admitting: Medical

## 2022-04-06 ENCOUNTER — Ambulatory Visit (INDEPENDENT_AMBULATORY_CARE_PROVIDER_SITE_OTHER): Payer: Medicare Other | Admitting: Medical

## 2022-04-06 VITALS — BP 140/90 | HR 72 | Wt 184.6 lb

## 2022-04-06 DIAGNOSIS — I10 Essential (primary) hypertension: Secondary | ICD-10-CM

## 2022-04-06 DIAGNOSIS — Z7901 Long term (current) use of anticoagulants: Secondary | ICD-10-CM | POA: Diagnosis not present

## 2022-04-06 DIAGNOSIS — Z950 Presence of cardiac pacemaker: Secondary | ICD-10-CM

## 2022-04-06 DIAGNOSIS — I4821 Permanent atrial fibrillation: Secondary | ICD-10-CM | POA: Diagnosis not present

## 2022-04-06 DIAGNOSIS — I700 Atherosclerosis of aorta: Secondary | ICD-10-CM

## 2022-04-06 DIAGNOSIS — N1831 Chronic kidney disease, stage 3a: Secondary | ICD-10-CM | POA: Diagnosis not present

## 2022-04-06 NOTE — Progress Notes (Signed)
Subjective:  [name]. is a 88 y.o. male who presents for Chief Complaint  Patient presents with   Acute Visit    Bp was 150/100 today when pt check it.     Here today with his daughter [name]  Here for concerns about BP.  No symptoms of concern.  He has not been checking his pressures in general but today decided to check it and it was high.  Got a reading of 150/100 at home today.  He denies any symptoms.  No chest pain, palpitations, or other.  He does note some mild swelling of his legs.   currently on Coreg.  Limits or avoids salt.  Was on Doxazosin prior but this was discontinued prior, maybe months ago. No other c/o.   Past Medical History:  Diagnosis Date   A-fib [facility])    Arthritis    CAD (coronary artery disease)    GXT, neg bruce protocol GXT   Chronic kidney disease    RENAL INSUFFICIENCY   Diverticulosis    ED (erectile dysfunction)    GERD (gastroesophageal reflux disease)    Glaucoma    Gout    no flare in years    Heart disorder    Hypertension    Obesity    Current Outpatient Medications on File Prior to Visit  Medication Sig Dispense Refill   acetaminophen (TYLENOL) 500 MG tablet Take 500 mg by mouth every 6 (six) hours as needed (for pain).     allopurinol (ZYLOPRIM) 100 MG tablet Take 1 tablet (100 mg total) by mouth daily. 90 tablet 3   carvedilol (COREG) 3.125 MG tablet TAKE 1 TABLET BY MOUTH  TWICE DAILY 180 tablet 3   Cholecalciferol (VITAMIN D3) 1000 units CAPS Take 2,000 Units by mouth daily.     finasteride (PROSCAR) 5 MG tablet TAKE 1 TABLET BY MOUTH EVERY DAY 90 tablet 1   latanoprost (XALATAN) 0.005 % ophthalmic solution Place 1 drop into both eyes at bedtime.   3   Multiple Vitamins-Minerals (PRESERVISION AREDS 2 PO) Take 1 capsule by mouth 2 (two) times daily.      simvastatin (ZOCOR) 20 MG tablet TAKE 1 TABLET BY MOUTH AT  BEDTIME 90 tablet 3   tamsulosin (FLOMAX) 0.4 MG CAPS capsule Take 1 capsule (0.4 mg total) by mouth daily. 90  capsule 3   warfarin (COUMADIN) 2.5 MG tablet TAKE 1 TO 1 AND 1/2 TABLETS BY MOUTH DAILY AS DIRECTED  BY THE COUMADIN CLINIC 120 tablet 1   Current Facility-Administered Medications on File Prior to Visit  Medication Dose Route Frequency Provider Last Rate Last Admin   influenza  inactive virus vaccine (FLUZONE/FLUARIX) injection 0.5 mL  0.5 mL Intramuscular Once Rita Ohara, MD        The following portions of the patient's history were reviewed and updated as appropriate: allergies, current medications, past family history, past medical history, past social history, past surgical history and problem list.  ROS Otherwise as in subjective above    Objective: BP 140/90   Pulse 72   Wt 184 lb 9.6 oz (83.7 kg)   SpO2 98%   BMI 31.69 kg/m   BP Readings from Last 3 Encounters:  04/06/22 140/90  01/04/22 (!) 142/84  01/03/22 130/80   Wt Readings from Last 3 Encounters:  04/06/22 184 lb 9.6 oz (83.7 kg)  01/04/22 191 lb 12.8 oz (87 kg)  01/03/22 192 lb 3.2 oz (87.2 kg)   General appearance: alert, no distress,  well developed, well nourished Neck: supple, no lymphadenopathy, no thyromegaly, no masses, no JVD Heart: somewhat irregular, otherwise normal S1, S2, no murmurs Lungs: CTA bilaterally, no wheezes, rhonchi, or rales Pulses: 2+ radial pulses, 1+ pedal pulses, normal cap refill Ext: 1+ LE bilat  edema, no calve swelling tendnerss or asymmetry   Assessment: Encounter Diagnoses  Name Primary?   Primary hypertension Yes   Permanent atrial fibrillation (HCC)    PACEMAKER-[place]    Long term (current) use of anticoagulants    Stage 3a chronic kidney disease (HCC)    Aortic atherosclerosis ([place])      Plan: He is feeling fine and just happened to check a random blood pressure today that was elevated.  He has not been checking his pressures in general.  Looking back his blood pressures have been normal to a little borderline in the past year.  His reading today is not  terribly high.  I recommended he bring his cuff in next time to compare side-by-side with our cuff.  I recommended he monitor his blood pressures 3 days/week for the next 2 to 3 weeks then recheck.  Limit salt, consider TED hose over-the-counter, consider leg elevation.  He does need to be walking more.  He has a circular place in his house he can walk regularly he is not doing it currently.  [name] was seen today for acute visit.  Diagnoses and all orders for this visit:  Primary hypertension -     Basic metabolic panel  Permanent atrial fibrillation (HCC)  PACEMAKER-[facility]  Long term (current) use of anticoagulants  Stage 3a chronic kidney disease ([place])  Aortic atherosclerosis ([place])    Follow up: 2-4 weeks

## 2022-04-07 ENCOUNTER — Ambulatory Visit: Payer: Medicare Other

## 2022-04-07 DIAGNOSIS — Z5181 Encounter for therapeutic drug level monitoring: Secondary | ICD-10-CM

## 2022-04-07 DIAGNOSIS — Z7901 Long term (current) use of anticoagulants: Secondary | ICD-10-CM | POA: Diagnosis not present

## 2022-04-07 LAB — BASIC METABOLIC PANEL
BUN/Creatinine Ratio: 17 (ref 10–24)
BUN: 24 mg/dL (ref 8–27)
CO2: 25 mmol/L (ref 20–29)
Calcium: 10.1 mg/dL (ref 8.6–10.2)
Chloride: 106 mmol/L (ref 96–106)
Creatinine, Ser: 1.39 mg/dL — ABNORMAL HIGH (ref 0.76–1.27)
Glucose: 85 mg/dL (ref 70–99)
Potassium: 5.2 mmol/L (ref 3.5–5.2)
Sodium: 144 mmol/L (ref 134–144)
eGFR: 48 mL/min/{1.73_m2} — ABNORMAL LOW (ref >59)

## 2022-04-07 LAB — POCT INR: INR: 2.4 (ref 2.0–3.0)

## 2022-04-07 NOTE — Patient Instructions (Signed)
Description   Continue taking 2.5 mg daily except 3.75 mg each Monday.   Recheck INR In 6 weeks [facility] [phone]

## 2022-04-26 ENCOUNTER — Ambulatory Visit: Payer: Medicare Other | Admitting: Family Medicine

## 2022-04-27 ENCOUNTER — Encounter: Payer: Self-pay | Admitting: Family Medicine

## 2022-04-27 ENCOUNTER — Ambulatory Visit (INDEPENDENT_AMBULATORY_CARE_PROVIDER_SITE_OTHER): Payer: Medicare Other | Admitting: Family Medicine

## 2022-04-27 VITALS — BP 138/86 | HR 66 | Temp 98.5°F | Wt 185.6 lb

## 2022-04-27 DIAGNOSIS — I10 Essential (primary) hypertension: Secondary | ICD-10-CM

## 2022-04-27 DIAGNOSIS — R29898 Other symptoms and signs involving the musculoskeletal system: Secondary | ICD-10-CM | POA: Insufficient documentation

## 2022-04-27 NOTE — Progress Notes (Signed)
   Subjective:    Patient ID: Mark Hooker., male    DOB: 1930/11/24, 88 y.o.   MRN: [id number]  HPI He is here for mainly for blood pressure recheck.  He continues on Coreg and is having no difficulty with that.  He is very and active in spite of being admonished and encouraged to be as physically active as possible.  He does use a walker and his daughter is interested in him getting a wheelchair for when they have to go longer distances.   Review of Systems     Objective:   Physical Exam Alert and in no distress.  Blood pressure is recorded.       Assessment & Plan:   Primary hypertension  Muscular deconditioning Continue on present medication regimen.  I encouraged him as much as possible to get active in the house and even outside.  Discussed 20 minutes of something physical even divided up in the 5-minute increments.

## 2022-05-13 ENCOUNTER — Telehealth: Payer: Self-pay | Admitting: Pharmacist

## 2022-05-13 NOTE — Chronic Care Management (AMB) (Signed)
Chronic Care Management Pharmacy Assistant   Name: [name].  MRN: [id number] DOB: March 07, 1931  Reason for Encounter: Disease State    Conditions to be addressed/monitored: HTN  Recent office visits:  04/27/22 [name], MD - Patient presented for Primary hypertension and other concerns. No medication changes.  04/06/22 Tysinger, [name], PA-C - Patient presented for Primary hypertension and other concerns. No medication changes.  Recent consult visits:  04/07/22 [name], RN - Patient presented for Anti Coag Visit. Reading was 2.4.  Hospital visits:  None in previous 6 months  Medications: Outpatient Encounter Medications as of 05/13/2022  Medication Sig   acetaminophen (TYLENOL) 500 MG tablet Take 500 mg by mouth every 6 (six) hours as needed (for pain).   allopurinol (ZYLOPRIM) 100 MG tablet Take 1 tablet (100 mg total) by mouth daily.   carvedilol (COREG) 3.125 MG tablet TAKE 1 TABLET BY MOUTH  TWICE DAILY   Cholecalciferol (VITAMIN D3) 1000 units CAPS Take 2,000 Units by mouth daily.   finasteride (PROSCAR) 5 MG tablet TAKE 1 TABLET BY MOUTH EVERY DAY   latanoprost (XALATAN) 0.005 % ophthalmic solution Place 1 drop into both eyes at bedtime.    Multiple Vitamins-Minerals (PRESERVISION AREDS 2 PO) Take 1 capsule by mouth 2 (two) times daily.    simvastatin (ZOCOR) 20 MG tablet TAKE 1 TABLET BY MOUTH AT  BEDTIME   tamsulosin (FLOMAX) 0.4 MG CAPS capsule Take 1 capsule (0.4 mg total) by mouth daily.   warfarin (COUMADIN) 2.5 MG tablet TAKE 1 TO 1 AND 1/2 TABLETS BY MOUTH DAILY AS DIRECTED  BY THE COUMADIN CLINIC   Facility-Administered Encounter Medications as of 05/13/2022  Medication   influenza  inactive virus vaccine (FLUZONE/FLUARIX) injection 0.5 mL   Reviewed chart prior to disease state call. Spoke with patient regarding BP  Recent Office Vitals: BP Readings from Last 3 Encounters:  04/27/22 138/86  04/06/22 140/90  01/04/22 (!) 142/84    Pulse Readings from Last 3 Encounters:  04/27/22 66  04/06/22 72  01/04/22 76    Wt Readings from Last 3 Encounters:  04/27/22 185 lb 9.6 oz (84.2 kg)  04/06/22 184 lb 9.6 oz (83.7 kg)  01/04/22 191 lb 12.8 oz (87 kg)     Kidney Function Lab Results  Component Value Date/Time   CREATININE 1.39 (H) 04/06/2022 02:16 PM   CREATININE 1.36 (H) 01/03/2022 02:43 PM   CREATININE 1.45 (H) 01/12/2017 08:49 AM   CREATININE 1.51 (H) 06/16/2015 12:01 AM   GFRNONAA 46 (L) 04/20/2020 01:53 PM   GFRAA 54 (L) 04/20/2020 01:53 PM       Latest Ref Rng & Units 04/06/2022    2:16 PM 01/03/2022    2:43 PM 12/31/2020    2:11 PM  BMP  Glucose 70 - 99 mg/dL 85  88  98   BUN 8 - 27 mg/dL '24  20  21   '$ Creatinine 0.76 - 1.27 mg/dL 1.39  1.36  1.48   BUN/Creat Ratio 10 - '24 17  15  14   '$ Sodium 134 - 144 mmol/L 144  148  143   Potassium 3.5 - 5.2 mmol/L 5.2  5.1  4.9   Chloride 96 - 106 mmol/L 106  106  103   CO2 20 - 29 mmol/L '25  24  23   '$ Calcium 8.6 - 10.2 mg/dL 10.1  9.9  9.4     Current antihypertensive regimen:  Carvedilol 3.125 mg 1 tablet  twice daily - Appropriate, Effective, Safe, Accessible Doxazosin 4 mg 1 tablet at bedtime - Appropriate, Effective, Query Safe, Accessible Unable to reach for assessment   Care Gaps: Zoster Vaccine - Overdue COVID Booster - Overdue Flu Vaccine - Overdue CCM- 10/23 BP- 138/86 04/27/22 AWV- 4/23   Star Rating Drugs: Simvastatin (Zocor) 20 mg - Last filled 03/07/22 90 DS at [place] Pharmacist Assistant [phone]

## 2022-05-18 ENCOUNTER — Encounter: Payer: Self-pay | Admitting: Internal Medicine

## 2022-05-19 ENCOUNTER — Ambulatory Visit: Payer: Medicare Other

## 2022-05-26 ENCOUNTER — Ambulatory Visit: Payer: Medicare Other | Attending: Cardiology

## 2022-05-26 DIAGNOSIS — Z7901 Long term (current) use of anticoagulants: Secondary | ICD-10-CM | POA: Diagnosis not present

## 2022-05-26 LAB — POCT INR: INR: 2.8 (ref 2.0–3.0)

## 2022-05-26 NOTE — Patient Instructions (Signed)
Description   Continue taking 2.5 mg daily except 3.75 mg each Monday. Recheck INR In 6 weeks [facility] [phone]

## 2022-06-01 ENCOUNTER — Other Ambulatory Visit: Payer: Self-pay | Admitting: Internal Medicine

## 2022-06-01 DIAGNOSIS — I4821 Permanent atrial fibrillation: Secondary | ICD-10-CM

## 2022-06-10 ENCOUNTER — Ambulatory Visit (INDEPENDENT_AMBULATORY_CARE_PROVIDER_SITE_OTHER): Payer: Medicare Other

## 2022-06-10 DIAGNOSIS — I4821 Permanent atrial fibrillation: Secondary | ICD-10-CM

## 2022-06-10 LAB — CUP PACEART REMOTE DEVICE CHECK
Battery Remaining Longevity: 13 mo
Battery Remaining Percentage: 11 %
Battery Voltage: 2.84 V
Brady Statistic RV Percent Paced: 50 %
Date Time Interrogation Session: 20230928162147
Implantable Lead Implant Date: 20040429
Implantable Lead Location: 753860
Implantable Pulse Generator Implant Date: 20110325
Lead Channel Impedance Value: 430 Ohm
Lead Channel Pacing Threshold Amplitude: 0.75 V
Lead Channel Pacing Threshold Pulse Width: 0.9 ms
Lead Channel Sensing Intrinsic Amplitude: 1.3 mV
Lead Channel Setting Pacing Amplitude: 2.5 V
Lead Channel Setting Pacing Pulse Width: 0.9 ms
Lead Channel Setting Sensing Sensitivity: 0.7 mV
Pulse Gen Model: 1110
Pulse Gen Serial Number: 2313421

## 2022-06-15 ENCOUNTER — Telehealth: Payer: Self-pay | Admitting: Pharmacist

## 2022-06-15 NOTE — Chronic Care Management (AMB) (Signed)
    Chronic Care Management Pharmacy Assistant   Name: [name].  MRN: [id number] DOB: [birth year] . 06/15/22 APPOINTMENT REMINDER  Called Patient No answer, left message of appointment on 06/16/22 at 8:30 via telephone visit with Jeni Salles, Pharm D.   Notified to have all medications, supplements, blood pressure and/or blood sugar logs available during appointment and to return call if need to reschedule.     Care Gaps: Zoster Vaccine - Overdue COVID Booster - Overdue Flu Vaccine - Overdue BP-  138/86 04/27/22 AWV- 4/23   Star Rating Drug: Simvastatin (Zocor) 20 mg - Last filled 06/07/22 90 DS at [place]     Medications: Outpatient Encounter Medications as of 06/15/2022  Medication Sig   acetaminophen (TYLENOL) 500 MG tablet Take 500 mg by mouth every 6 (six) hours as needed (for pain).   allopurinol (ZYLOPRIM) 100 MG tablet Take 1 tablet (100 mg total) by mouth daily.   carvedilol (COREG) 3.125 MG tablet TAKE 1 TABLET BY MOUTH  TWICE DAILY   Cholecalciferol (VITAMIN D3) 1000 units CAPS Take 2,000 Units by mouth daily.   finasteride (PROSCAR) 5 MG tablet TAKE 1 TABLET BY MOUTH EVERY DAY   latanoprost (XALATAN) 0.005 % ophthalmic solution Place 1 drop into both eyes at bedtime.    Multiple Vitamins-Minerals (PRESERVISION AREDS 2 PO) Take 1 capsule by mouth 2 (two) times daily.    simvastatin (ZOCOR) 20 MG tablet TAKE 1 TABLET BY MOUTH AT  BEDTIME   tamsulosin (FLOMAX) 0.4 MG CAPS capsule Take 1 capsule (0.4 mg total) by mouth daily.   warfarin (COUMADIN) 2.5 MG tablet TAKE 1 TO 1 AND 1/2 TABLETS BY  MOUTH DAILY AS DIRECTED BY THE  COUMADIN CLINIC   Facility-Administered Encounter Medications as of 06/15/2022  Medication   influenza  inactive virus vaccine (FLUZONE/FLUARIX) injection 0.5 mL     Ned Clines CMA Clinical Pharmacist Assistant [phone]
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
## 2022-06-15 NOTE — Progress Notes (Signed)
Remote pacemaker transmission.   

## 2022-06-16 ENCOUNTER — Ambulatory Visit: Payer: Medicare Other | Admitting: Pharmacist

## 2022-06-16 DIAGNOSIS — I4821 Permanent atrial fibrillation: Secondary | ICD-10-CM

## 2022-06-16 DIAGNOSIS — I10 Essential (primary) hypertension: Secondary | ICD-10-CM

## 2022-06-16 NOTE — Patient Instructions (Addendum)
Hi [name],  It was great to catch up with you again! Don't forget to move tamsulosin and finasteride to before bedtime.  Also, please  Please reach out to me if you have any questions or need anything before our follow up!  Best, Maddie  Jeni Salles, PharmD, [place] [phone]  Visit Information   Goals Addressed   None    Patient Care Plan: CCM Pharmacy Care Plan     Problem Identified: Problem: Hypertension, Hyperlipidemia, Atrial Fibrillation, Coronary Artery Disease, GERD, Chronic Kidney Disease, Osteoarthritis, BPH, and Gout      Long-Range Goal: Patient-Specific Goal   Start Date: 07/01/2021  Expected End Date: 07/01/2021  Recent Progress: On track  Priority: High  Note:   Current Barriers:  Unable to independently monitor therapeutic efficacy  Pharmacist Clinical Goal(s):  Patient will achieve adherence to monitoring guidelines and medication adherence to achieve therapeutic efficacy through collaboration with PharmD and provider.   Interventions: 1:1 collaboration with [name], MD regarding development and update of comprehensive plan of care as evidenced by provider attestation and co-signature Inter-disciplinary care team collaboration (see longitudinal plan of care) Comprehensive medication review performed; medication list updated in electronic medical record  Hypertension (BP goal <140/90) -Not ideally controlled -Current treatment: Carvedilol 3.125 mg 1 tablet twice daily - Appropriate, Effective, Safe, Accessible -Medications previously tried: doxazosin (dizziness)  -Current home readings: not checking anymore (has an arm cuff but was 50 points) -Current dietary habits: he has never been a salt person - only on tomato and sandwiches -Current exercise habits: no structured exercise -Denies hypotensive/hypertensive symptoms -Educated on Importance of home blood pressure monitoring; Proper BP  monitoring technique; Symptoms of hypotension and importance of maintaining adequate hydration; -Counseled to monitor BP at home weekly, document, and provide log at future appointments -Recommended to continue current medication Counseled on taking carvedilol with food.  Hyperlipidemia: (LDL goal < 70) -Controlled -Current treatment: Simvastatin 20 mg 1 tablet daily at bedtime - Appropriate, Effective, Safe, Accessible -Medications previously tried: none  -Current dietary patterns: eats fish once a week; doesn't eat oatmeal -Current exercise habits: no structured exercise -Educated on Cholesterol goals;  Benefits of statin for ASCVD risk reduction; -Counseled on diet and exercise extensively Recommended to continue current medication  Atrial Fibrillation (Goal: prevent stroke and major bleeding) -Controlled -CHADSVASC: 4 -Current treatment: Rate control: Carvedilol 3.125 mg 1 tablet twice daily - Appropriate, Effective, Safe, Accessible Anticoagulation: warfarin 2.5 mg tablet as directed by coumadin clinic - Appropriate, Effective, Safe, Accessible -Medications previously tried: none -Home BP and HR readings: does not check  -Counseled on increased risk of stroke due to Afib and benefits of anticoagulation for stroke prevention; bleeding risk associated with warfarin and importance of self-monitoring for signs/symptoms of bleeding; avoidance of NSAIDs due to increased bleeding risk with anticoagulants; -Recommended to continue current medication  CAD (Goal: prevent heart events) -Controlled -Current treatment  Warfarin 2.5 mg tablet as directed by coumadin clinic - Appropriate, Effective, Safe, Accessible Simvastatin 20 mg 1 tablet daily - Appropriate, Effective, Safe, Accessible -Medications previously tried: none  -Recommended to continue current medication  Gout (Goal: uric acid < 6 and prevent flare ups) -Controlled -Current treatment  Allopurinol 100 mg 1 tablet daily  - Appropriate, Effective, Safe, Accessible -Medications previously tried: none  -Counseled on foods/beverages that can increase uric acid levels and increase risk for gout.  BPH (Goal: minimize symptoms of enlarged prostate) -Not ideally controlled -Current treatment  Tamsulosin 0.4 mg 1  capsule daily - Appropriate, Query effective, Safe, Accessible Finasteride 5 mg 1 tablet daily - Appropriate, Query effective, Safe, Accessible -Medications previously tried: doxazosin (switched to tamsulosin) -Recommended moving tamsulosin and finasteride to bedtime.  Health Maintenance -Vaccine gaps: shingrix, COVID booster -Current therapy:  Vitamin D3 2000 units daily  Latanoprost 0.005% 1 drop in both eyes at bedtime Areds 2 1 tablet twice daily Acetaminophen 500 mg as needed -Educated on Cost vs benefit of each product must be carefully weighed by individual consumer -Patient is satisfied with current therapy and denies issues -Recommended to continue current medication  Patient Goals/Self-Care Activities Patient will:  - take medications as prescribed check blood pressure weekly, document, and provide at future appointments  Follow Up Plan: Telephone follow up appointment with care management team member scheduled for: 6 months       Patient verbalizes understanding of instructions and care plan provided today and agrees to view in [place]. Active MyChart status and patient understanding of how to access instructions and care plan via MyChart confirmed with patient.    Telephone follow up appointment with pharmacy team member scheduled for: 6 months  Viona Gilmore, [facility]

## 2022-06-17 ENCOUNTER — Other Ambulatory Visit: Payer: Self-pay

## 2022-06-17 DIAGNOSIS — R531 Weakness: Secondary | ICD-10-CM

## 2022-06-21 ENCOUNTER — Encounter: Payer: Self-pay | Admitting: Internal Medicine

## 2022-06-29 DIAGNOSIS — L570 Actinic keratosis: Secondary | ICD-10-CM | POA: Diagnosis not present

## 2022-06-29 DIAGNOSIS — D0422 Carcinoma in situ of skin of left ear and external auricular canal: Secondary | ICD-10-CM | POA: Diagnosis not present

## 2022-07-04 ENCOUNTER — Encounter: Payer: Self-pay | Admitting: Internal Medicine

## 2022-07-07 ENCOUNTER — Ambulatory Visit: Payer: Medicare Other

## 2022-07-11 ENCOUNTER — Ambulatory Visit: Payer: Medicare Other | Attending: Cardiology

## 2022-07-11 DIAGNOSIS — Z5181 Encounter for therapeutic drug level monitoring: Secondary | ICD-10-CM

## 2022-07-11 DIAGNOSIS — Z7901 Long term (current) use of anticoagulants: Secondary | ICD-10-CM

## 2022-07-11 LAB — POCT INR: INR: 2.3 (ref 2.0–3.0)

## 2022-07-11 NOTE — Patient Instructions (Signed)
Description   Continue taking 2.5 mg daily except 3.75 mg each Monday. Recheck INR In 7 weeks Coumadin Clinic [phone]

## 2022-07-14 ENCOUNTER — Encounter: Payer: Self-pay | Admitting: Internal Medicine

## 2022-07-14 ENCOUNTER — Ambulatory Visit: Payer: Medicare Other | Attending: Family Medicine | Admitting: Physical Therapy

## 2022-07-14 DIAGNOSIS — M6281 Muscle weakness (generalized): Secondary | ICD-10-CM | POA: Insufficient documentation

## 2022-07-14 DIAGNOSIS — R2681 Unsteadiness on feet: Secondary | ICD-10-CM | POA: Diagnosis not present

## 2022-07-14 DIAGNOSIS — R531 Weakness: Secondary | ICD-10-CM | POA: Insufficient documentation

## 2022-07-14 DIAGNOSIS — R2689 Other abnormalities of gait and mobility: Secondary | ICD-10-CM | POA: Insufficient documentation

## 2022-07-14 NOTE — Therapy (Signed)
OUTPATIENT PHYSICAL THERAPY NEURO EVALUATION   Patient Name: [name]. MRN: [id number] DOB:Aug 05, 1931, 88 y.o., male Today's Date: 07/14/2022   PCP: [name], MD  REFERRING PROVIDER: [name], MD    PT End of Session - 07/14/22 [id number]     Visit Number 1    Number of Visits 7   with eval   Date for PT Re-Evaluation 09/08/22   to allow for scheduling conflicts   Authorization Type [employer] Medicare    Progress Note Due on Visit --    PT Start Time 0935   pt late   PT Stop Time 1007   eval   PT Time Calculation (min) 32 min    Activity Tolerance Patient tolerated treatment well    Behavior During Therapy WFL for tasks assessed/performed             Past Medical History:  Diagnosis Date   A-fib ([place])    Arthritis    CAD (coronary artery disease)    GXT, neg bruce protocol GXT   Chronic kidney disease    RENAL INSUFFICIENCY   Diverticulosis    ED (erectile dysfunction)    GERD (gastroesophageal reflux disease)    Glaucoma    Gout    no flare in years    Heart disorder    Hypertension    Obesity    Past Surgical History:  Procedure Laterality Date   CATARACT EXTRACTION, BILATERAL  2018   with lens placement    PACEMAKER INSERTION  01/09/03   TONSILLECTOMY     TOTAL KNEE ARTHROPLASTY Right 02/27/2018   Procedure: RIGHT TOTAL KNEE ARTHROPLASTY;  Surgeon: Paralee Cancel, MD;  Location: [facility];  Service: Orthopedics;  Laterality: Right;  70 mins   TOTAL KNEE ARTHROPLASTY Left 09/06/2018   Procedure: LEFT TOTAL KNEE ARTHROPLASTY;  Surgeon: Paralee Cancel, MD;  Location: [facility];  Service: Orthopedics;  Laterality: Left;  70 mins   Patient Active Problem List   Diagnosis Date Noted   Muscular deconditioning 04/27/2022   Aortic atherosclerosis ([facility]) 10/29/2020   Bradycardia 09/26/2019   Gout involving toe 08/07/2019   Benign prostatic hyperplasia with urinary frequency 03/21/2019   Status post total bilateral knee replacement 09/06/2018   S/P right TKA  02/27/2018   Obesity    GERD (gastroesophageal reflux disease)    ED (erectile dysfunction)    Diverticulosis    CAD (coronary artery disease)    Permanent atrial fibrillation ([facility])    Stage 3a chronic kidney disease ([place]) 01/13/2017   Atherosclerosis 12/12/2016   Arthritis 12/01/2014   Abnormality of gait 02/05/2013   Long term (current) use of anticoagulants 12/29/2012   Glaucoma 07/26/2011   Hyperlipidemia LDL goal <70 07/26/2011   Primary hypertension 07/26/2011   PACEMAKER-St.Jude 12/26/2008    ONSET DATE: 06/17/2022   REFERRING DIAG: R53.1 (ICD-10-CM) - Decreased strength   THERAPY DIAG:  Muscle weakness (generalized)  Other abnormalities of gait and mobility  Unsteadiness on feet  Rationale for Evaluation and Treatment: Rehabilitation  SUBJECTIVE:  SUBJECTIVE STATEMENT: Pt reports decreased strength in his LLE since his TKA in 2019. Pt reports several falls and inability to get back up by himself, reports no injuries with falls. Pt reports his main problem is balance, uses SPC in the home and RW in the community for about a year. Pt reports that when he falls he falls backwards.  Pt accompanied by: self  PERTINENT HISTORY: primary HTN, permanent A-fib, PACEMAKER-St.Jude, long term use of anticoagulants, stage 3a CKD, aortic atherosclerosis  PAIN:  Are you having pain? No  PRECAUTIONS: Fall and ICD/Pacemaker  WEIGHT BEARING RESTRICTIONS: No  FALLS: Has patient fallen in last 6 months? Yes. Number of falls unable to recall; initially states "no falls" but then recounts having difficulty getting back up from the floor after a fall  LIVING ENVIRONMENT: Lives with: lives with their daughter [name]) Lives in: House/apartment Stairs: Yes: External: 5 steps; can reach both Has  following equipment at home: Single point cane and Walker - 2 wheeled  PLOF: Independent with gait, Independent with transfers, and Requires assistive device for independence  PATIENT GOALS: "my balance is the main thing"  OBJECTIVE:   DIAGNOSTIC FINDINGS: None relevant to this treatment plan  COGNITION: Overall cognitive status: No family/caregiver present to determine baseline cognitive functioning   SENSATION: WFL per pt report  EDEMA:  Pt reports edema in BLE, wears compression stockings  POSTURE: rounded shoulders and forward head  LOWER EXTREMITY MMT:    MMT Right Eval Left Eval  Hip flexion 5 4  Hip extension    Hip abduction    Hip adduction    Hip internal rotation    Hip external rotation    Knee flexion 5 4  Knee extension 5 4  Ankle dorsiflexion 5 4  Ankle plantarflexion    Ankle inversion    Ankle eversion    (Blank rows = not tested)  BED MOBILITY:  Pt reports he can get into bed independently, has more trouble getting out of bed due to soft mattress.  TRANSFERS: Assistive device utilized: Environmental consultant - 2 wheeled  Sit to stand: Modified independence Stand to sit: Modified independence Chair to chair: Modified independence Floor:  not assessed at eval  GAIT: Gait pattern: decreased step length- Right, decreased step length- Left, and trunk flexed Distance walked: various clinic distances Assistive device utilized: Environmental consultant - 2 wheeled Level of assistance: Modified independence Comments: intermittent use of RW, tends to push device out and away from him  FUNCTIONAL TESTS:    [facility] PT Assessment - 07/14/22 0948       Ambulation/Gait   Gait velocity 32.8 ft over 23 sec = 1.43 ft/sec      Standardized Balance Assessment   Standardized Balance Assessment Timed Up and Go Test;Five Times Sit to Stand;Berg Balance Test    Five times sit to stand comments  26   pushing up with BUE from arms of chair     Berg Balance Test   Sit to Stand Able to stand  without using hands and stabilize independently    Standing Unsupported Able to stand safely 2 minutes    Sitting with Back Unsupported but Feet Supported on Floor or Stool Able to sit safely and securely 2 minutes    Stand to Sit Sits safely with minimal use of hands    Transfers Able to transfer safely, minor use of hands    Standing Unsupported with Eyes Closed Able to stand 10 seconds with supervision    Standing Unsupported  with Feet Together Able to place feet together independently and stand for 1 minute with supervision    From Standing, Reach Forward with Outstretched Arm Reaches forward but needs supervision    From Standing Position, Pick up Object from [place] to pick up shoe, needs supervision    From Standing Position, Turn to Look Behind Over each Shoulder Looks behind from both sides and weight shifts well    Turn 360 Degrees Able to turn 360 degrees safely but slowly    Standing Unsupported, Alternately Place Feet on Step/Stool Needs assistance to keep from falling or unable to try    Standing Unsupported, One Foot in [place] help to step but can hold 15 seconds    Standing on One Leg Unable to try or needs assist to prevent fall    Total Score 37    Berg comment: 37/56, significant fall risk      Timed Up and Go Test   TUG Normal TUG    Normal TUG (seconds) 17.47   with RW            TODAY'S TREATMENT:                                                                                                                               PT eval   PATIENT EDUCATION: Education details: Eval findings, POC Person educated: Patient Education method: Customer service manager Education comprehension: verbalized understanding and needs further education  HOME EXERCISE PROGRAM: To be initiated next session  GOALS: Goals reviewed with patient? Yes  SHORT TERM GOALS: Target date: 08/11/2022  1.  Pt will be independent with initial HEP for improved strength,  balance, transfers and gait. Baseline:  Goal status: INITIAL  2.  Pt will improve gait velocity to at least 1.75 ft/sec for improved gait efficiency and performance at mod I level  Baseline: 1.43 ft/sec (11/2) Goal status: INITIAL  3.  FGA to be assessed and LTG written Baseline:  Goal status: INITIAL   LONG TERM GOALS: Target date: 09/08/2022  Pt will be independent with final HEP for improved strength, balance, transfers and gait. Baseline:  Goal status: INITIAL  2.  Pt will improve gait velocity to at least 2.0 ft/sec for improved gait efficiency and performance at mod I level  Baseline: 1.43 ft/sec (11/2) Goal status: INITIAL  3.  Pt will improve 5 x STS to less than or equal to 20 seconds to demonstrate improved functional strength and transfer efficiency.  Baseline: 26 sec (11/2) Goal status: INITIAL  4.  Pt will improve Berg score to 45/56 for decreased fall risk Baseline: 37/56 (11/2) Goal status: INITIAL  5.  FGA to be assessed and LTG written Baseline:  Goal status: INITIAL   ASSESSMENT:  CLINICAL IMPRESSION: Patient is a 88 year old male referred to Neuro OPPT for decreased strength.   Pt's PMH is significant for: primary HTN, permanent A-fib, PACEMAKER-St.Jude, long term use  of anticoagulants, stage 3a CKD, aortic atherosclerosis. The following deficits were present during the exam: decreased gait speed, decreased LLE strength, and decreased balance. Based on his gait speed of 1.43 ft/sec, TUG score of 17.47 sec, and Berg score of 37/56, pt is an increased risk for falls. Pt would benefit from skilled PT to address these impairments and functional limitations to maximize functional mobility independence.  OBJECTIVE IMPAIRMENTS: decreased balance and decreased strength.   ACTIVITY LIMITATIONS: bed mobility  PARTICIPATION LIMITATIONS: community activity  PERSONAL FACTORS: Age, Fitness, Transportation, and 1-2 comorbidities:    primary HTN, permanent A-fib,  PACEMAKER-St.Jude, long term use of anticoagulants, stage 3a CKD, aortic atherosclerosisare also affecting patient's functional outcome.   REHAB POTENTIAL: Fair sedentary lifestyle  CLINICAL DECISION MAKING: Stable/uncomplicated  EVALUATION COMPLEXITY: Low  PLAN:  PT FREQUENCY: 1x/week  PT DURATION: 8 weeks (7 total visits)  PLANNED INTERVENTIONS: Therapeutic exercises, Therapeutic activity, Neuromuscular re-education, Balance training, Gait training, Patient/Family education, Self Care, Joint mobilization, Stair training, Vestibular training, Canalith repositioning, Visual/preceptual remediation/compensation, Orthotic/Fit training, DME instructions, Dry Needling, Cryotherapy, Moist heat, Taping, Manual therapy, and Re-evaluation  PLAN FOR NEXT SESSION: assess FGA and write LTG, initiate HEP for balance (based on BBS struggles with tandem, SLS, step-taps)   Excell Seltzer, PT, DPT, CSRS 07/14/2022, 10:09 AM

## 2022-07-28 ENCOUNTER — Ambulatory Visit: Payer: Medicare Other

## 2022-07-29 ENCOUNTER — Ambulatory Visit: Payer: Medicare Other

## 2022-07-29 DIAGNOSIS — M6281 Muscle weakness (generalized): Secondary | ICD-10-CM

## 2022-07-29 DIAGNOSIS — R531 Weakness: Secondary | ICD-10-CM | POA: Diagnosis not present

## 2022-07-29 DIAGNOSIS — R2681 Unsteadiness on feet: Secondary | ICD-10-CM | POA: Diagnosis not present

## 2022-07-29 DIAGNOSIS — R2689 Other abnormalities of gait and mobility: Secondary | ICD-10-CM

## 2022-07-29 NOTE — Therapy (Signed)
OUTPATIENT PHYSICAL THERAPY NEURO TREATMENT   Patient Name: [name]. MRN: [id number] DOB:[birth year], 88 y.o., male Today's Date: 07/29/2022   PCP: [name], MD  REFERRING PROVIDER: [name], MD    PT End of Session - 07/29/22 0757     Visit Number 2    Number of Visits 7    Date for PT Re-Evaluation 09/08/22    Authorization Type [employer] Medicare    Progress Note Due on Visit 10    PT Start Time 0800    PT Stop Time 0842    PT Time Calculation (min) 42 min    Equipment Utilized During Treatment Gait belt    Activity Tolerance Patient tolerated treatment well    Behavior During Therapy WFL for tasks assessed/performed             Past Medical History:  Diagnosis Date   A-fib ([place])    Arthritis    CAD (coronary artery disease)    GXT, neg bruce protocol GXT   Chronic kidney disease    RENAL INSUFFICIENCY   Diverticulosis    ED (erectile dysfunction)    GERD (gastroesophageal reflux disease)    Glaucoma    Gout    no flare in years    Heart disorder    Hypertension    Obesity    Squamous cell carcinoma in situ    Past Surgical History:  Procedure Laterality Date   CATARACT EXTRACTION, BILATERAL  2018   with lens placement    PACEMAKER INSERTION  01/09/03   TONSILLECTOMY     TOTAL KNEE ARTHROPLASTY Right 02/27/2018   Procedure: RIGHT TOTAL KNEE ARTHROPLASTY;  Surgeon: Paralee Cancel, MD;  Location: [facility];  Service: Orthopedics;  Laterality: Right;  70 mins   TOTAL KNEE ARTHROPLASTY Left 09/06/2018   Procedure: LEFT TOTAL KNEE ARTHROPLASTY;  Surgeon: Paralee Cancel, MD;  Location: [facility];  Service: Orthopedics;  Laterality: Left;  70 mins   Patient Active Problem List   Diagnosis Date Noted   Muscular deconditioning 04/27/2022   Aortic atherosclerosis ([place]) 10/29/2020   Bradycardia 09/26/2019   Gout involving toe 08/07/2019   Benign prostatic hyperplasia with urinary frequency 03/21/2019   Status post total bilateral knee replacement  09/06/2018   S/P right TKA 02/27/2018   Obesity    GERD (gastroesophageal reflux disease)    ED (erectile dysfunction)    Diverticulosis    CAD (coronary artery disease)    Permanent atrial fibrillation ([facility])    Stage 3a chronic kidney disease ([place]) 01/13/2017   Atherosclerosis 12/12/2016   Arthritis 12/01/2014   Abnormality of gait 02/05/2013   Long term (current) use of anticoagulants 12/29/2012   Glaucoma 07/26/2011   Hyperlipidemia LDL goal <70 07/26/2011   Primary hypertension 07/26/2011   PACEMAKER-St.Jude 12/26/2008    ONSET DATE: 06/17/2022   REFERRING DIAG: R53.1 (ICD-10-CM) - Decreased strength   THERAPY DIAG:  Muscle weakness (generalized)  Other abnormalities of gait and mobility  Unsteadiness on feet  Rationale for Evaluation and Treatment: Rehabilitation  SUBJECTIVE:  SUBJECTIVE STATEMENT: Patient reports doing well. Denies falls/near falls, but then describes semi-frequent LOB posteriorly "my weight just goes to my heels."   Pt accompanied by: self  PERTINENT HISTORY: primary HTN, permanent A-fib, PACEMAKER-St.Jude, long term use of anticoagulants, stage 3a CKD, aortic atherosclerosis  PAIN:  Are you having pain? No  PRECAUTIONS: Fall and ICD/Pacemaker   PATIENT GOALS: "my balance is the main thing"  TODAY'S TREATMENT:                                                                                                                              Therex:  -scifit hills level 1 x10 mins B UE/LE   NMR:  -HEP (see below)  -M-CTSIB:  Condition 1: 30s mild sway   Condition 2: 30s mild sway   Condition 3: 13s moderate sway   Condition 4: 3s with moderate sway   [facility] PT Assessment - 07/29/22 0001       Functional Gait  Assessment   Gait assessed  Yes    Gait Level  Surface Walks 20 ft in less than 7 sec but greater than 5.5 sec, uses assistive device, slower speed, mild gait deviations, or deviates 6-10 in outside of the 12 in walkway width.    Change in Gait Speed Makes only minor adjustments to walking speed, or accomplishes a change in speed with significant gait deviations, deviates 10-15 in outside the 12 in walkway width, or changes speed but loses balance but is able to recover and continue walking.    Gait with Horizontal Head Turns Performs head turns with moderate changes in gait velocity, slows down, deviates 10-15 in outside 12 in walkway width but recovers, can continue to walk.    Gait with Vertical Head Turns Performs task with moderate change in gait velocity, slows down, deviates 10-15 in outside 12 in walkway width but recovers, can continue to walk.    Gait and Pivot Turn Cannot turn safely, requires assistance to turn and stop.    Step Over Obstacle Cannot perform without assistance.    Gait with Narrow Base of Support Ambulates less than 4 steps heel to toe or cannot perform without assistance.    Gait with Eyes Closed Cannot walk 20 ft without assistance, severe gait deviations or imbalance, deviates greater than 15 in outside 12 in walkway width or will not attempt task.    Ambulating Backwards Cannot walk 20 ft without assistance, severe gait deviations or imbalance, deviates greater than 15 in outside 12 in walkway width or will not attempt task.    Steps Two feet to a stair, must use rail.    Total Score 6              PATIENT EDUCATION: Education details: initial HEP, exam results Person educated: Patient Education method: Explanation and Demonstration Education comprehension: verbalized understanding and needs further education  HOME EXERCISE PROGRAM: Access Code: [email or web address] URL: [email or web address] Date: 07/29/2022 Prepared by: Anderson Malta  Shron Ozer  Exercises - Supine Bridge  - 1 x daily - 7 x weekly - 3 sets  - 10 reps - Clamshell  - 1 x daily - 7 x weekly - 3 sets - 10 reps - Sit to Stand with Counter Support  - 1 x daily - 7 x weekly - 3 sets - 10 reps - Tandem Walking with Counter Support  - 1 x daily - 7 x weekly - 3 sets - 10 reps - Heel Toe Raises with Counter Support  - 1 x daily - 7 x weekly - 3 sets - 10 reps - Backward Walking with Counter Support  - 1 x daily - 7 x weekly - 3 sets - 10 reps  GOALS: Goals reviewed with patient? Yes  SHORT TERM GOALS: Target date: 08/11/2022  1.  Pt will be independent with initial HEP for improved strength, balance, transfers and gait. Baseline:  Goal status: INITIAL  2.  Pt will improve gait velocity to at least 1.75 ft/sec for improved gait efficiency and performance at mod I level  Baseline: 1.43 ft/sec (11/2) Goal status: INITIAL  3.  FGA to be assessed and LTG written Baseline: assessed Goal status: MET   LONG TERM GOALS: Target date: 09/08/2022  Pt will be independent with final HEP for improved strength, balance, transfers and gait. Baseline:  Goal status: INITIAL  2.  Pt will improve gait velocity to at least 2.0 ft/sec for improved gait efficiency and performance at mod I level  Baseline: 1.43 ft/sec (11/2) Goal status: INITIAL  3.  Pt will improve 5 x STS to less than or equal to 20 seconds to demonstrate improved functional strength and transfer efficiency.  Baseline: 26 sec (11/2) Goal status: INITIAL  4.  Pt will improve Berg score to 45/56 for decreased fall risk Baseline: 37/56 (11/2) Goal status: INITIAL  5.  Pt will improve FGA to >/= 11/30 to demonstrate improved balance and reduced fall risk  Baseline: 6/30 Goal status: INITIAL  6. Patient will be able to complete >/=15s on condition 4 of M-CTSIB to demonstrate improved balance   Baseline: 3s  Goal Status: NEW   ASSESSMENT:  CLINICAL IMPRESSION: Patient seen for skilled PT session with emphasis on establishing HEP and functional LE strengthening. Patient  frequently leaving RW behind when turning to sit and requires increased cuing to correctly turn to sit when using RW. Patient demonstrates increased fall risk as noted by score of 6/30 on  Functional Gait Assessment.   <22/30 = predictive of falls, <20/30 = fall in 6 months, <18/30 = predictive of falls in PD MCID: 5 points stroke population, 4 points geriatric population (ANPTA Core Set of Outcome Measures for Adults with Neurologic Conditions, 2018). Patient with significant difficulty maintaining balance on compliant surfaces and even more so with EC. Continue POC.    OBJECTIVE IMPAIRMENTS: decreased balance and decreased strength.   ACTIVITY LIMITATIONS: bed mobility  PARTICIPATION LIMITATIONS: community activity  PERSONAL FACTORS: Age, Fitness, Transportation, and 1-2 comorbidities:    primary HTN, permanent A-fib, PACEMAKER-St.Jude, long term use of anticoagulants, stage 3a CKD, aortic atherosclerosisare also affecting patient's functional outcome.   REHAB POTENTIAL: Fair sedentary lifestyle  CLINICAL DECISION MAKING: Stable/uncomplicated  EVALUATION COMPLEXITY: Low  PLAN:  PT FREQUENCY: 1x/week  PT DURATION: 8 weeks (7 total visits)  PLANNED INTERVENTIONS: Therapeutic exercises, Therapeutic activity, Neuromuscular re-education, Balance training, Gait training, Patient/Family education, Self Care, Joint mobilization, Stair training, Vestibular training, Canalith repositioning, Visual/preceptual remediation/compensation, Orthotic/Fit training, DME instructions,  Dry Needling, Cryotherapy, Moist heat, Taping, Manual therapy, and Re-evaluation  PLAN FOR NEXT SESSION: backward walking, stepping strategies, compliant surfaces, EC    Debbora Dus, PT, DPT, CBIS 07/29/2022, 8:47 AM

## 2022-08-11 ENCOUNTER — Ambulatory Visit: Payer: Medicare Other | Admitting: Physical Therapy

## 2022-08-11 DIAGNOSIS — M6281 Muscle weakness (generalized): Secondary | ICD-10-CM

## 2022-08-11 DIAGNOSIS — R2681 Unsteadiness on feet: Secondary | ICD-10-CM | POA: Diagnosis not present

## 2022-08-11 DIAGNOSIS — R2689 Other abnormalities of gait and mobility: Secondary | ICD-10-CM | POA: Diagnosis not present

## 2022-08-11 DIAGNOSIS — R531 Weakness: Secondary | ICD-10-CM | POA: Diagnosis not present

## 2022-08-11 NOTE — Therapy (Addendum)
OUTPATIENT PHYSICAL THERAPY NEURO TREATMENT   Patient Name: [name]. MRN: [id number] DOB:[birth year], 88 y.o., male Today's Date: 08/11/2022   PCP: [name], MD  REFERRING PROVIDER: [name], MD    PT End of Session - 08/11/22 1452     Visit Number 3    Number of Visits 7    Date for PT Re-Evaluation 09/08/22    Authorization Type [employer] Medicare    Progress Note Due on Visit 10    PT Start Time 1450   ran over with previous patient   PT Stop Time 40    PT Time Calculation (min) 40 min    Equipment Utilized During Treatment Gait belt    Activity Tolerance Patient tolerated treatment well    Behavior During Therapy WFL for tasks assessed/performed              Past Medical History:  Diagnosis Date   A-fib ([place])    Arthritis    CAD (coronary artery disease)    GXT, neg bruce protocol GXT   Chronic kidney disease    RENAL INSUFFICIENCY   Diverticulosis    ED (erectile dysfunction)    GERD (gastroesophageal reflux disease)    Glaucoma    Gout    no flare in years    Heart disorder    Hypertension    Obesity    Squamous cell carcinoma in situ    Past Surgical History:  Procedure Laterality Date   CATARACT EXTRACTION, BILATERAL  2018   with lens placement    PACEMAKER INSERTION  01/09/03   TONSILLECTOMY     TOTAL KNEE ARTHROPLASTY Right 02/27/2018   Procedure: RIGHT TOTAL KNEE ARTHROPLASTY;  Surgeon: Paralee Cancel, MD;  Location: [facility];  Service: Orthopedics;  Laterality: Right;  70 mins   TOTAL KNEE ARTHROPLASTY Left 09/06/2018   Procedure: LEFT TOTAL KNEE ARTHROPLASTY;  Surgeon: Paralee Cancel, MD;  Location: [facility];  Service: Orthopedics;  Laterality: Left;  70 mins   Patient Active Problem List   Diagnosis Date Noted   Muscular deconditioning 04/27/2022   Aortic atherosclerosis ([place]) 10/29/2020   Bradycardia 09/26/2019   Gout involving toe 08/07/2019   Benign prostatic hyperplasia with urinary frequency 03/21/2019   Status post  total bilateral knee replacement 09/06/2018   S/P right TKA 02/27/2018   Obesity    GERD (gastroesophageal reflux disease)    ED (erectile dysfunction)    Diverticulosis    CAD (coronary artery disease)    Permanent atrial fibrillation ([facility])    Stage 3a chronic kidney disease ([place]) 01/13/2017   Atherosclerosis 12/12/2016   Arthritis 12/01/2014   Abnormality of gait 02/05/2013   Long term (current) use of anticoagulants 12/29/2012   Glaucoma 07/26/2011   Hyperlipidemia LDL goal <70 07/26/2011   Primary hypertension 07/26/2011   PACEMAKER-St.Jude 12/26/2008    ONSET DATE: 06/17/2022   REFERRING DIAG: R53.1 (ICD-10-CM) - Decreased strength   THERAPY DIAG:  Muscle weakness (generalized)  Other abnormalities of gait and mobility  Unsteadiness on feet  Rationale for Evaluation and Treatment: Rehabilitation  SUBJECTIVE:  SUBJECTIVE STATEMENT: Pt reports no falls since last session and no pain today. Pt reports he has been working on his HEP, struggles to keep his balance with tandem gait.  Pt accompanied by: self  PERTINENT HISTORY: primary HTN, permanent A-fib, PACEMAKER-St.Jude, long term use of anticoagulants, stage 3a CKD, aortic atherosclerosis  PAIN:  Are you having pain? No  PRECAUTIONS: Fall and ICD/Pacemaker   PATIENT GOALS: "my balance is the main thing"  TODAY'S TREATMENT:                                                                                                                               THER ACT:  [facility] PT Assessment - 08/11/22 1458       Ambulation/Gait   Gait velocity 32.8 ft over 20.5 sec = 1.6 ft/sec             Tandem gait at countertop with one UE support, 6 x 10 ft Pt exhibits good balance when performing exercise, encouraged him to keep working on  this exercise at home as he tends to notice decreased balance when performing it at home  In // bars for static standing balance: static stance on airex performing ball toss x 15 reps each normal stance Romberg stance L/R modified tandem stance No UE support and CGA for balance sidesteps on foam beam // bars with progression from BUE support to B fingertip support, 6 x 10 ft  At mat table for standing balance and single leg stance stability: Blaze pods random 5 Alt L/R tapping with min A for balance Pt scores 10, 8, then 5 taps (decreased performance as task progresses) Pt exhibits most difficulty with SLS on LLE    [facility] PT Assessment - 08/11/22 1458       Ambulation/Gait   Gait velocity 32.8 ft over 20.5 sec = 1.6 ft/sec             PATIENT EDUCATION: Education details: continue HEP, exam results Person educated: Patient Education method: Explanation and Demonstration Education comprehension: verbalized understanding and needs further education  HOME EXERCISE PROGRAM: Access Code: [email or web address] URL: [email or web address] Date: 07/29/2022 Prepared by: Estevan Ryder  Exercises - Supine Bridge  - 1 x daily - 7 x weekly - 3 sets - 10 reps - Clamshell  - 1 x daily - 7 x weekly - 3 sets - 10 reps - Sit to Stand with Counter Support  - 1 x daily - 7 x weekly - 3 sets - 10 reps - Tandem Walking with Counter Support  - 1 x daily - 7 x weekly - 3 sets - 10 reps - Heel Toe Raises with Counter Support  - 1 x daily - 7 x weekly - 3 sets - 10 reps - Backward Walking with Counter Support  - 1 x daily - 7 x weekly - 3 sets - 10 reps  GOALS: Goals reviewed with patient? Yes  SHORT TERM GOALS: Target date: 08/11/2022  1.  Pt will be independent with initial HEP for improved strength, balance, transfers and gait. Baseline:  Goal status: MET  2.  Pt will improve gait velocity to at least 1.75 ft/sec for improved gait efficiency and performance at mod I level   Baseline: 1.43 ft/sec (11/2), 1.6 ft/sec (11/30) Goal status: IN PROGRESS  3.  FGA to be assessed and LTG written Baseline: assessed Goal status: MET   LONG TERM GOALS: Target date: 09/08/2022  Pt will be independent with final HEP for improved strength, balance, transfers and gait. Baseline:  Goal status: INITIAL  2.  Pt will improve gait velocity to at least 1.75 ft/sec for improved gait efficiency and performance at mod I level  Baseline: 1.43 ft/sec (11/2), 1.6 ft/sec (11/30) Goal status: REVISED  3.  Pt will improve 5 x STS to less than or equal to 20 seconds to demonstrate improved functional strength and transfer efficiency.  Baseline: 26 sec (11/2) Goal status: INITIAL  4.  Pt will improve Berg score to 45/56 for decreased fall risk Baseline: 37/56 (11/2) Goal status: INITIAL  5.  Pt will improve FGA to >/= 11/30 to demonstrate improved balance and reduced fall risk  Baseline: 6/30 Goal status: INITIAL  6. Patient will be able to complete >/=15s on condition 4 of M-CTSIB to demonstrate improved balance   Baseline: 3s  Goal Status: NEW   ASSESSMENT:  CLINICAL IMPRESSION: Emphasis of skilled PT session on performing STG assessment, working on static standing balance on compliant surface, and working on single limb stability in stance. Pt has met 2/3 STG due to being independent with his initial HEP and having the FGA assessed and a LTG written. Pt is making progress towards improving his gait speed from 1.43 ft/sec initially to 1.6 ft/sec this date, but did not quite meet his goal of 1.75 ft/sec. Pt continues to benefit from skilled therapy services to improve his global endurance and his balance in order to decrease his fall risk. Continue POC.    OBJECTIVE IMPAIRMENTS: decreased balance and decreased strength.   ACTIVITY LIMITATIONS: bed mobility  PARTICIPATION LIMITATIONS: community activity  PERSONAL FACTORS: Age, Fitness, Transportation, and 1-2  comorbidities:    primary HTN, permanent A-fib, PACEMAKER-St.Jude, long term use of anticoagulants, stage 3a CKD, aortic atherosclerosisare also affecting patient's functional outcome.   REHAB POTENTIAL: Fair sedentary lifestyle  CLINICAL DECISION MAKING: Stable/uncomplicated  EVALUATION COMPLEXITY: Low  PLAN:  PT FREQUENCY: 1x/week  PT DURATION: 8 weeks (7 total visits)  PLANNED INTERVENTIONS: Therapeutic exercises, Therapeutic activity, Neuromuscular re-education, Balance training, Gait training, Patient/Family education, Self Care, Joint mobilization, Stair training, Vestibular training, Canalith repositioning, Visual/preceptual remediation/compensation, Orthotic/Fit training, DME instructions, Dry Needling, Cryotherapy, Moist heat, Taping, Manual therapy, and Re-evaluation  PLAN FOR NEXT SESSION: backward walking, stepping strategies, compliant surfaces, EC, SLS, decreased UE support    Excell Seltzer, PT, DPT, CSRS 08/11/2022, 4:11 PM

## 2022-08-18 ENCOUNTER — Ambulatory Visit: Payer: Medicare Other | Attending: Family Medicine | Admitting: Physical Therapy

## 2022-08-18 DIAGNOSIS — R2681 Unsteadiness on feet: Secondary | ICD-10-CM | POA: Diagnosis not present

## 2022-08-18 DIAGNOSIS — M6281 Muscle weakness (generalized): Secondary | ICD-10-CM | POA: Diagnosis not present

## 2022-08-18 DIAGNOSIS — R2689 Other abnormalities of gait and mobility: Secondary | ICD-10-CM | POA: Diagnosis not present

## 2022-08-18 NOTE — Therapy (Signed)
OUTPATIENT PHYSICAL THERAPY NEURO TREATMENT   Patient Name: [name]. MRN: [id number] DOB:Mar 05, 1931, 88 y.o., male Today's Date: 08/18/2022   PCP: [name], MD  REFERRING PROVIDER: [name], MD    PT End of Session - 08/18/22 1456     Visit Number 4    Number of Visits 7    Date for PT Re-Evaluation 09/08/22    Authorization Type [employer] Medicare    Progress Note Due on Visit 10    PT Start Time 1455   ran over with previous patient   PT Stop Time 1535    PT Time Calculation (min) 40 min    Equipment Utilized During Treatment Gait belt    Activity Tolerance Patient tolerated treatment well    Behavior During Therapy WFL for tasks assessed/performed               Past Medical History:  Diagnosis Date   A-fib ([place])    Arthritis    CAD (coronary artery disease)    GXT, neg bruce protocol GXT   Chronic kidney disease    RENAL INSUFFICIENCY   Diverticulosis    ED (erectile dysfunction)    GERD (gastroesophageal reflux disease)    Glaucoma    Gout    no flare in years    Heart disorder    Hypertension    Obesity    Squamous cell carcinoma in situ    Past Surgical History:  Procedure Laterality Date   CATARACT EXTRACTION, BILATERAL  2018   with lens placement    PACEMAKER INSERTION  01/09/03   TONSILLECTOMY     TOTAL KNEE ARTHROPLASTY Right 02/27/2018   Procedure: RIGHT TOTAL KNEE ARTHROPLASTY;  Surgeon: Paralee Cancel, MD;  Location: [facility];  Service: Orthopedics;  Laterality: Right;  70 mins   TOTAL KNEE ARTHROPLASTY Left 09/06/2018   Procedure: LEFT TOTAL KNEE ARTHROPLASTY;  Surgeon: Paralee Cancel, MD;  Location: [facility];  Service: Orthopedics;  Laterality: Left;  70 mins   Patient Active Problem List   Diagnosis Date Noted   Muscular deconditioning 04/27/2022   Aortic atherosclerosis ([place]) 10/29/2020   Bradycardia 09/26/2019   Gout involving toe 08/07/2019   Benign prostatic hyperplasia with urinary frequency 03/21/2019   Status post  total bilateral knee replacement 09/06/2018   S/P right TKA 02/27/2018   Obesity    GERD (gastroesophageal reflux disease)    ED (erectile dysfunction)    Diverticulosis    CAD (coronary artery disease)    Permanent atrial fibrillation ([facility])    Stage 3a chronic kidney disease ([place]) 01/13/2017   Atherosclerosis 12/12/2016   Arthritis 12/01/2014   Abnormality of gait 02/05/2013   Long term (current) use of anticoagulants 12/29/2012   Glaucoma 07/26/2011   Hyperlipidemia LDL goal <70 07/26/2011   Primary hypertension 07/26/2011   PACEMAKER-St.Jude 12/26/2008    ONSET DATE: 06/17/2022   REFERRING DIAG: R53.1 (ICD-10-CM) - Decreased strength   THERAPY DIAG:  Muscle weakness (generalized)  Other abnormalities of gait and mobility  Unsteadiness on feet  Rationale for Evaluation and Treatment: Rehabilitation  SUBJECTIVE:  SUBJECTIVE STATEMENT: Pt reports he is doing ok, no acute changes since last session. Pt reports his HEP is going well except for the tandem gait which still remains a challenge.  Pt accompanied by: self  PERTINENT HISTORY: primary HTN, permanent A-fib, PACEMAKER-St.Jude, long term use of anticoagulants, stage 3a CKD, aortic atherosclerosis  PAIN:  Are you having pain? No  PRECAUTIONS: Fall and ICD/Pacemaker   PATIENT GOALS: "my balance is the main thing"  TODAY'S TREATMENT:                                                                                                                               THER ACT: At mat table with no UE support and min A for balance Sidesteps L/R with alt L/R gumdrop taps Pt exhibits decreased balance as task progresses Needs min A to prevent falls with 2 instances of LOB  Alt L/R gumdrop taps with gait with min A for balance 3 x 20 ft  with cues for more controlled target tapping in order to increase time spent in SLS  Standing L/R yardstick stepovers with min A for balance Focus on increasing step length during gait Folllowed by gait around therapy gym with improved LE clearance and step length noted initially, does decrease with onset of fatigue (decreased speed and decreased step length)   GAIT: Gait pattern: decreased step length- Right, decreased step length- Left, and shuffling Distance walked: 115 ft Assistive device utilized: None Level of assistance: CGA Comments: trial gait no AD around therapy gym, decreased gait speed, onset of fatigue after one lap    PATIENT EDUCATION: Education details: continue HEP Person educated: Patient Education method: Customer service manager Education comprehension: verbalized understanding and needs further education  HOME EXERCISE PROGRAM: Access Code: [email or web address] URL: [email or web address] Date: 07/29/2022 Prepared by: Estevan Ryder  Exercises - Supine Bridge  - 1 x daily - 7 x weekly - 3 sets - 10 reps - Clamshell  - 1 x daily - 7 x weekly - 3 sets - 10 reps - Sit to Stand with Counter Support  - 1 x daily - 7 x weekly - 3 sets - 10 reps - Tandem Walking with Counter Support  - 1 x daily - 7 x weekly - 3 sets - 10 reps - Heel Toe Raises with Counter Support  - 1 x daily - 7 x weekly - 3 sets - 10 reps - Backward Walking with Counter Support  - 1 x daily - 7 x weekly - 3 sets - 10 reps  GOALS: Goals reviewed with patient? Yes  SHORT TERM GOALS: Target date: 08/11/2022  1.  Pt will be independent with initial HEP for improved strength, balance, transfers and gait. Baseline:  Goal status: MET  2.  Pt will improve gait velocity to at least 1.75 ft/sec for improved gait efficiency and performance at mod I level  Baseline: 1.43 ft/sec (11/2), 1.6 ft/sec (11/30) Goal status: IN PROGRESS  3.  FGA to be assessed and LTG written Baseline:  assessed Goal status: MET   LONG TERM GOALS: Target date: 09/08/2022  Pt will be independent with final HEP for improved strength, balance, transfers and gait. Baseline:  Goal status: INITIAL  2.  Pt will improve gait velocity to at least 1.75 ft/sec for improved gait efficiency and performance at mod I level  Baseline: 1.43 ft/sec (11/2), 1.6 ft/sec (11/30) Goal status: REVISED  3.  Pt will improve 5 x STS to less than or equal to 20 seconds to demonstrate improved functional strength and transfer efficiency.  Baseline: 26 sec (11/2) Goal status: INITIAL  4.  Pt will improve Berg score to 45/56 for decreased fall risk Baseline: 37/56 (11/2) Goal status: INITIAL  5.  Pt will improve FGA to >/= 11/30 to demonstrate improved balance and reduced fall risk  Baseline: 6/30 Goal status: INITIAL  6. Patient will be able to complete >/=15s on condition 4 of M-CTSIB to demonstrate improved balance   Baseline: 3s  Goal Status: NEW   ASSESSMENT:  CLINICAL IMPRESSION: Emphasis of skilled PT session on continuing to work on single limb stability and stance with decreased UE support as well as increasing B step length during gait. Pt continues to exhibit difficulty maintaining standing balance in SLS with up to min A needed at times to prevent a fall. Pt also continues to exhibit a shuffling gait pattern with decreased BLE clearance during gait. Pt exhibits some improvement in his gait pattern following blocked practice of BLE target step-overs with good carryover to gait. However, with onset of fatigue he does revert to his shuffling gait pattern. Pt continues to benefit from skilled therapy services to address ongoing decreased BLE strength, decreased balance, and increased fall risk. Continue POC.    OBJECTIVE IMPAIRMENTS: decreased balance and decreased strength.   ACTIVITY LIMITATIONS: bed mobility  PARTICIPATION LIMITATIONS: community activity  PERSONAL FACTORS: Age, Fitness,  Transportation, and 1-2 comorbidities:    primary HTN, permanent A-fib, PACEMAKER-St.Jude, long term use of anticoagulants, stage 3a CKD, aortic atherosclerosisare also affecting patient's functional outcome.   REHAB POTENTIAL: Fair sedentary lifestyle  CLINICAL DECISION MAKING: Stable/uncomplicated  EVALUATION COMPLEXITY: Low  PLAN:  PT FREQUENCY: 1x/week  PT DURATION: 8 weeks (7 total visits)  PLANNED INTERVENTIONS: Therapeutic exercises, Therapeutic activity, Neuromuscular re-education, Balance training, Gait training, Patient/Family education, Self Care, Joint mobilization, Stair training, Vestibular training, Canalith repositioning, Visual/preceptual remediation/compensation, Orthotic/Fit training, DME instructions, Dry Needling, Cryotherapy, Moist heat, Taping, Manual therapy, and Re-evaluation  PLAN FOR NEXT SESSION: backward walking, stepping strategies, compliant surfaces, EC, SLS, decreased UE support, agility ladder    Excell Seltzer, PT, DPT, CSRS 08/18/2022, 3:36 PM

## 2022-08-19 ENCOUNTER — Other Ambulatory Visit: Payer: Self-pay | Admitting: Internal Medicine

## 2022-08-25 ENCOUNTER — Ambulatory Visit: Payer: Medicare Other | Admitting: Physical Therapy

## 2022-08-25 DIAGNOSIS — M6281 Muscle weakness (generalized): Secondary | ICD-10-CM

## 2022-08-25 DIAGNOSIS — R2681 Unsteadiness on feet: Secondary | ICD-10-CM

## 2022-08-25 DIAGNOSIS — R2689 Other abnormalities of gait and mobility: Secondary | ICD-10-CM

## 2022-08-25 NOTE — Therapy (Signed)
OUTPATIENT PHYSICAL THERAPY NEURO TREATMENT   Patient Name: [name]. MRN: [id number] DOB:1931/09/05, 88 y.o., male Today's Date: 08/25/2022   PCP: [name], MD  REFERRING PROVIDER: [name], MD    PT End of Session - 08/25/22 1453     Visit Number 5    Number of Visits 7    Date for PT Re-Evaluation 09/08/22    Authorization Type [employer] Medicare    Progress Note Due on Visit 10    PT Start Time 1450    PT Stop Time 1530    PT Time Calculation (min) 40 min    Equipment Utilized During Treatment Gait belt    Activity Tolerance Patient tolerated treatment well    Behavior During Therapy WFL for tasks assessed/performed                Past Medical History:  Diagnosis Date   A-fib ([place])    Arthritis    CAD (coronary artery disease)    GXT, neg bruce protocol GXT   Chronic kidney disease    RENAL INSUFFICIENCY   Diverticulosis    ED (erectile dysfunction)    GERD (gastroesophageal reflux disease)    Glaucoma    Gout    no flare in years    Heart disorder    Hypertension    Obesity    Squamous cell carcinoma in situ    Past Surgical History:  Procedure Laterality Date   CATARACT EXTRACTION, BILATERAL  2018   with lens placement    PACEMAKER INSERTION  01/09/03   TONSILLECTOMY     TOTAL KNEE ARTHROPLASTY Right 02/27/2018   Procedure: RIGHT TOTAL KNEE ARTHROPLASTY;  Surgeon: Paralee Cancel, MD;  Location: [facility];  Service: Orthopedics;  Laterality: Right;  70 mins   TOTAL KNEE ARTHROPLASTY Left 09/06/2018   Procedure: LEFT TOTAL KNEE ARTHROPLASTY;  Surgeon: Paralee Cancel, MD;  Location: [facility];  Service: Orthopedics;  Laterality: Left;  70 mins   Patient Active Problem List   Diagnosis Date Noted   Muscular deconditioning 04/27/2022   Aortic atherosclerosis ([place]) 10/29/2020   Bradycardia 09/26/2019   Gout involving toe 08/07/2019   Benign prostatic hyperplasia with urinary frequency 03/21/2019   Status post total bilateral knee  replacement 09/06/2018   S/P right TKA 02/27/2018   Obesity    GERD (gastroesophageal reflux disease)    ED (erectile dysfunction)    Diverticulosis    CAD (coronary artery disease)    Permanent atrial fibrillation ([facility])    Stage 3a chronic kidney disease ([place]) 01/13/2017   Atherosclerosis 12/12/2016   Arthritis 12/01/2014   Abnormality of gait 02/05/2013   Long term (current) use of anticoagulants 12/29/2012   Glaucoma 07/26/2011   Hyperlipidemia LDL goal <70 07/26/2011   Primary hypertension 07/26/2011   PACEMAKER-St.Jude 12/26/2008    ONSET DATE: 06/17/2022   REFERRING DIAG: R53.1 (ICD-10-CM) - Decreased strength   THERAPY DIAG:  Muscle weakness (generalized)  Other abnormalities of gait and mobility  Unsteadiness on feet  Rationale for Evaluation and Treatment: Rehabilitation  SUBJECTIVE:  SUBJECTIVE STATEMENT: Pt reports no acute changes, no falls. No pain today. Pt reports he feels like his walking hasn't gotten much better, is "slow". Pt reports that tandem gait remains challenging.  Pt accompanied by: self  PERTINENT HISTORY: primary HTN, permanent A-fib, PACEMAKER-St.Jude, long term use of anticoagulants, stage 3a CKD, aortic atherosclerosis  PAIN:  Are you having pain? No  PRECAUTIONS: Fall and ICD/Pacemaker   PATIENT GOALS: "my balance is the main thing"  TODAY'S TREATMENT:                                                                                                                               THER ACT: At mat table with no UE support and CGA for balance with focus on increased time in SLS Sidesteps L/R with alt L/R gumdrop taps Static stance on LLE performing RLE gumdrop taps Progresses sit to stands to resisted sit to stands, HEP updated Alt L/R 4" step taps  with no UE support with transition to one UE support for balance Added to HEP, see bolded below  GAIT: Treadmill level ground x 5 min initially at 0.7 mph with progression to 0.9 mph with focus on upright posture, widened BOS, decreased shuffling of feet, and maintaining a constant speed. Pt feels fatigued following gait on treadmill but exhibits good ability to maintain consistent speed.   PATIENT EDUCATION: Education details: continue HEP, added to HEP Person educated: Patient Education method: Explanation, Demonstration, and Handouts Education comprehension: verbalized understanding and needs further education  HOME EXERCISE PROGRAM: Access Code: [email or web address] URL: [email or web address] Date: 07/29/2022 Prepared by: Estevan Ryder  Exercises - Supine Bridge  - 1 x daily - 7 x weekly - 3 sets - 10 reps - Clamshell  - 1 x daily - 7 x weekly - 3 sets - 10 reps - Sit to Stand with Counter Support  - 1 x daily - 7 x weekly - 3 sets - 10 reps - Tandem Walking with Counter Support  - 1 x daily - 7 x weekly - 3 sets - 10 reps - Heel Toe Raises with Counter Support  - 1 x daily - 7 x weekly - 3 sets - 10 reps - Backward Walking with Counter Support  - 1 x daily - 7 x weekly - 3 sets - 10 reps - Alternating Step Taps with Counter Support  - 1 x daily - 7 x weekly - 3 sets - 10 reps - Sit to Stand with Resistance Around Legs  - 1 x daily - 7 x weekly - 3 sets - 10 reps  GOALS: Goals reviewed with patient? Yes  SHORT TERM GOALS: Target date: 08/11/2022  1.  Pt will be independent with initial HEP for improved strength, balance, transfers and gait. Baseline:  Goal status: MET  2.  Pt will improve gait velocity to at least 1.75 ft/sec for improved gait efficiency and performance at mod I level  Baseline: 1.43 ft/sec (11/2),  1.6 ft/sec (11/30) Goal status: IN PROGRESS  3.  FGA to be assessed and LTG written Baseline: assessed Goal status: MET   LONG TERM GOALS: Target  date: 09/08/2022  Pt will be independent with final HEP for improved strength, balance, transfers and gait. Baseline:  Goal status: INITIAL  2.  Pt will improve gait velocity to at least 1.75 ft/sec for improved gait efficiency and performance at mod I level  Baseline: 1.43 ft/sec (11/2), 1.6 ft/sec (11/30) Goal status: REVISED  3.  Pt will improve 5 x STS to less than or equal to 20 seconds to demonstrate improved functional strength and transfer efficiency.  Baseline: 26 sec (11/2) Goal status: INITIAL  4.  Pt will improve Berg score to 45/56 for decreased fall risk Baseline: 37/56 (11/2) Goal status: INITIAL  5.  Pt will improve FGA to >/= 11/30 to demonstrate improved balance and reduced fall risk  Baseline: 6/30 Goal status: INITIAL  6. Patient will be able to complete >/=15s on condition 4 of M-CTSIB to demonstrate improved balance   Baseline: 3s  Goal Status: NEW   ASSESSMENT:  CLINICAL IMPRESSION: Emphasis of skilled PT session on continuing to work on maintaining a consistent speed during gait, increased B step length during gait, and increasing balance in single limb stance. Pt continues to exhibit shuffling gait pattern with decreased gait speed and decreased balance in SLS (L>R). Pt continues to benefit from skilled therapy services to improve his balance and LE strength in order to prevent a fall. Continue POC.    OBJECTIVE IMPAIRMENTS: decreased balance and decreased strength.   ACTIVITY LIMITATIONS: bed mobility  PARTICIPATION LIMITATIONS: community activity  PERSONAL FACTORS: Age, Fitness, Transportation, and 1-2 comorbidities:    primary HTN, permanent A-fib, PACEMAKER-St.Jude, long term use of anticoagulants, stage 3a CKD, aortic atherosclerosisare also affecting patient's functional outcome.   REHAB POTENTIAL: Fair sedentary lifestyle  CLINICAL DECISION MAKING: Stable/uncomplicated  EVALUATION COMPLEXITY: Low  PLAN:  PT FREQUENCY: 1x/week  PT  DURATION: 8 weeks (7 total visits)  PLANNED INTERVENTIONS: Therapeutic exercises, Therapeutic activity, Neuromuscular re-education, Balance training, Gait training, Patient/Family education, Self Care, Joint mobilization, Stair training, Vestibular training, Canalith repositioning, Visual/preceptual remediation/compensation, Orthotic/Fit training, DME instructions, Dry Needling, Cryotherapy, Moist heat, Taping, Manual therapy, and Re-evaluation  PLAN FOR NEXT SESSION: backward walking, stepping strategies, compliant surfaces, EC, SLS, decreased UE support, agility ladder    Excell Seltzer, PT, DPT, CSRS 08/25/2022, 3:32 PM

## 2022-08-29 ENCOUNTER — Ambulatory Visit: Payer: Medicare Other | Attending: Cardiology

## 2022-08-29 DIAGNOSIS — Z7901 Long term (current) use of anticoagulants: Secondary | ICD-10-CM

## 2022-08-29 DIAGNOSIS — Z5181 Encounter for therapeutic drug level monitoring: Secondary | ICD-10-CM | POA: Diagnosis not present

## 2022-08-29 LAB — POCT INR: INR: 4.2 — AB (ref 2.0–3.0)

## 2022-08-29 NOTE — Patient Instructions (Signed)
HOLD TONIGHT ONLY AND THEN Continue taking 2.5 mg daily except 3.75 mg each Monday. Recheck INR In 4 weeks [facility] [phone]

## 2022-09-01 ENCOUNTER — Ambulatory Visit: Payer: Medicare Other | Admitting: Physical Therapy

## 2022-09-01 DIAGNOSIS — R2689 Other abnormalities of gait and mobility: Secondary | ICD-10-CM

## 2022-09-01 DIAGNOSIS — M6281 Muscle weakness (generalized): Secondary | ICD-10-CM

## 2022-09-01 DIAGNOSIS — R2681 Unsteadiness on feet: Secondary | ICD-10-CM

## 2022-09-01 NOTE — Therapy (Signed)
OUTPATIENT PHYSICAL THERAPY NEURO TREATMENT   Patient Name: [name]. MRN: [id number] DOB:1931/08/04, 88 y.o., male Today's Date: 09/01/2022   PCP: [name], MD  REFERRING PROVIDER: [name], MD    PT End of Session - 09/01/22 1450     Visit Number 6    Number of Visits 7    Date for PT Re-Evaluation 09/08/22    Authorization Type [employer] Medicare    Progress Note Due on Visit 10    PT Start Time 1448    PT Stop Time 1530    PT Time Calculation (min) 42 min    Equipment Utilized During Treatment Gait belt    Activity Tolerance Patient tolerated treatment well    Behavior During Therapy WFL for tasks assessed/performed                 Past Medical History:  Diagnosis Date   A-fib ([place])    Arthritis    CAD (coronary artery disease)    GXT, neg bruce protocol GXT   Chronic kidney disease    RENAL INSUFFICIENCY   Diverticulosis    ED (erectile dysfunction)    GERD (gastroesophageal reflux disease)    Glaucoma    Gout    no flare in years    Heart disorder    Hypertension    Obesity    Squamous cell carcinoma in situ    Past Surgical History:  Procedure Laterality Date   CATARACT EXTRACTION, BILATERAL  2018   with lens placement    PACEMAKER INSERTION  01/09/03   TONSILLECTOMY     TOTAL KNEE ARTHROPLASTY Right 02/27/2018   Procedure: RIGHT TOTAL KNEE ARTHROPLASTY;  Surgeon: Paralee Cancel, MD;  Location: [facility];  Service: Orthopedics;  Laterality: Right;  70 mins   TOTAL KNEE ARTHROPLASTY Left 09/06/2018   Procedure: LEFT TOTAL KNEE ARTHROPLASTY;  Surgeon: Paralee Cancel, MD;  Location: [facility];  Service: Orthopedics;  Laterality: Left;  70 mins   Patient Active Problem List   Diagnosis Date Noted   Muscular deconditioning 04/27/2022   Aortic atherosclerosis ([place]) 10/29/2020   Bradycardia 09/26/2019   Gout involving toe 08/07/2019   Benign prostatic hyperplasia with urinary frequency 03/21/2019   Status post total bilateral knee  replacement 09/06/2018   S/P right TKA 02/27/2018   Obesity    GERD (gastroesophageal reflux disease)    ED (erectile dysfunction)    Diverticulosis    CAD (coronary artery disease)    Permanent atrial fibrillation ([facility])    Stage 3a chronic kidney disease ([place]) 01/13/2017   Atherosclerosis 12/12/2016   Arthritis 12/01/2014   Abnormality of gait 02/05/2013   Long term (current) use of anticoagulants 12/29/2012   Glaucoma 07/26/2011   Hyperlipidemia LDL goal <70 07/26/2011   Primary hypertension 07/26/2011   PACEMAKER-St.Jude 12/26/2008    ONSET DATE: 06/17/2022   REFERRING DIAG: R53.1 (ICD-10-CM) - Decreased strength   THERAPY DIAG:  Muscle weakness (generalized)  Other abnormalities of gait and mobility  Unsteadiness on feet  Rationale for Evaluation and Treatment: Rehabilitation  SUBJECTIVE:  SUBJECTIVE STATEMENT: Pt reports his R Achilles has been bothering him today. Not sure why, only hurts when he points his toes up. No falls   Pt accompanied by: self  PERTINENT HISTORY: primary HTN, permanent A-fib, PACEMAKER-St.Jude, long term use of anticoagulants, stage 3a CKD, aortic atherosclerosis  PAIN:  Are you having pain? Yes: NPRS scale: 1/10 Pain location: R Achilles  Pain description: Achy  PRECAUTIONS: Fall and ICD/Pacemaker   PATIENT GOALS: "my balance is the main thing"  TODAY'S TREATMENT:                                                                                                                              Ther Ex  SciFit level 2 for 8 minutes using BUE/BLEs for neural priming for reciprocal movement, dynamic cardiovascular conditioning and global strengthening. RPE of 4/10 following activity.   Ther Act  LTG Assessment   [facility] PT Assessment - 09/01/22 1508        Berg Balance Test   Sit to Stand Able to stand without using hands and stabilize independently    Standing Unsupported Able to stand safely 2 minutes    Sitting with Back Unsupported but Feet Supported on Floor or Stool Able to sit safely and securely 2 minutes    Stand to Sit Sits safely with minimal use of hands    Transfers Able to transfer safely, minor use of hands    Standing Unsupported with Eyes Closed Able to stand 10 seconds safely    Standing Unsupported with Feet Together Able to place feet together independently and stand for 1 minute with supervision    From Standing, Reach Forward with Outstretched Arm Can reach forward >12 cm safely (5")    From Standing Position, Pick up Object from Floor Able to pick up shoe, needs supervision             Assessed pt's R foot and pt reported TTP along lateral aspect of foot and pain w/passive inversion. Noted increased edema of R ankle compared to L, mostly in lateral aspect. Suspect pt has sprained his R ankle (he is unsure how). Educated pt on R.I.C.E. method and ace-wrapped pt's ankle for added stability and pain modulation.    PATIENT EDUCATION: Education details: continue HEP Person educated: Patient Education method: Explanation, Demonstration, and Handouts Education comprehension: verbalized understanding and needs further education  HOME EXERCISE PROGRAM: Access Code: [email or web address] URL: [email or web address] Date: 07/29/2022 Prepared by: Estevan Ryder  Exercises - Supine Bridge  - 1 x daily - 7 x weekly - 3 sets - 10 reps - Clamshell  - 1 x daily - 7 x weekly - 3 sets - 10 reps - Sit to Stand with Counter Support  - 1 x daily - 7 x weekly - 3 sets - 10 reps - Tandem Walking with Counter Support  - 1 x daily - 7 x weekly - 3 sets - 10 reps - Heel Toe Raises with  Counter Support  - 1 x daily - 7 x weekly - 3 sets - 10 reps - Backward Walking with Counter Support  - 1 x daily - 7 x weekly - 3 sets - 10 reps -  Alternating Step Taps with Counter Support  - 1 x daily - 7 x weekly - 3 sets - 10 reps - Sit to Stand with Resistance Around Legs  - 1 x daily - 7 x weekly - 3 sets - 10 reps  GOALS: Goals reviewed with patient? Yes  SHORT TERM GOALS: Target date: 08/11/2022  1.  Pt will be independent with initial HEP for improved strength, balance, transfers and gait. Baseline:  Goal status: MET  2.  Pt will improve gait velocity to at least 1.75 ft/sec for improved gait efficiency and performance at mod I level  Baseline: 1.43 ft/sec (11/2), 1.6 ft/sec (11/30) Goal status: IN PROGRESS  3.  FGA to be assessed and LTG written Baseline: assessed Goal status: MET   LONG TERM GOALS: Target date: 09/08/2022  Pt will be independent with final HEP for improved strength, balance, transfers and gait. Baseline:  Goal status: INITIAL  2.  Pt will improve gait velocity to at least 1.75 ft/sec for improved gait efficiency and performance at mod I level  Baseline: 1.43 ft/sec (11/2), 1.6 ft/sec (11/30) Goal status: REVISED  3.  Pt will improve 5 x STS to less than or equal to 20 seconds to demonstrate improved functional strength and transfer efficiency.  Baseline: 26 sec (11/2) Goal status: INITIAL  4.  Pt will improve Berg score to 45/56 for decreased fall risk Baseline: 37/56 (11/2) Goal status: INITIAL  5.  Pt will improve FGA to >/= 11/30 to demonstrate improved balance and reduced fall risk  Baseline: 6/30 Goal status: INITIAL  6. Patient will be able to complete >/=15s on condition 4 of M-CTSIB to demonstrate improved balance   Baseline: 3s  Goal Status: NEW   ASSESSMENT:  CLINICAL IMPRESSION: Emphasis of skilled PT session on starting LTG assessment and assessing pt's ankle. Pt limited by R ankle pain today and upon further assessment, likely that pt sprained it. Educated pt on R.I.C.E. method and ace-wrapped ankle for added support. Initiated berg today but did not finish due to time  constraints. Continue POC.     OBJECTIVE IMPAIRMENTS: decreased balance and decreased strength.   ACTIVITY LIMITATIONS: bed mobility  PARTICIPATION LIMITATIONS: community activity  PERSONAL FACTORS: Age, Fitness, Transportation, and 1-2 comorbidities:    primary HTN, permanent A-fib, PACEMAKER-St.Jude, long term use of anticoagulants, stage 3a CKD, aortic atherosclerosisare also affecting patient's functional outcome.   REHAB POTENTIAL: Fair sedentary lifestyle  CLINICAL DECISION MAKING: Stable/uncomplicated  EVALUATION COMPLEXITY: Low  PLAN:  PT FREQUENCY: 1x/week  PT DURATION: 8 weeks (7 total visits)  PLANNED INTERVENTIONS: Therapeutic exercises, Therapeutic activity, Neuromuscular re-education, Balance training, Gait training, Patient/Family education, Self Care, Joint mobilization, Stair training, Vestibular training, Canalith repositioning, Visual/preceptual remediation/compensation, Orthotic/Fit training, DME instructions, Dry Needling, Cryotherapy, Moist heat, Taping, Manual therapy, and Re-evaluation  PLAN FOR NEXT SESSION: finish Berg and check goals, DC? backward walking, stepping strategies, compliant surfaces, EC, SLS, decreased UE support, agility ladder    Cruzita Lederer Adger Cantera, PT, DPT 09/01/2022, 3:31 PM

## 2022-09-05 ENCOUNTER — Other Ambulatory Visit: Payer: Self-pay | Admitting: Family Medicine

## 2022-09-05 DIAGNOSIS — N401 Enlarged prostate with lower urinary tract symptoms: Secondary | ICD-10-CM

## 2022-09-08 ENCOUNTER — Ambulatory Visit: Payer: Medicare Other | Admitting: Physical Therapy

## 2022-09-08 DIAGNOSIS — R2681 Unsteadiness on feet: Secondary | ICD-10-CM

## 2022-09-08 DIAGNOSIS — M6281 Muscle weakness (generalized): Secondary | ICD-10-CM

## 2022-09-08 DIAGNOSIS — R2689 Other abnormalities of gait and mobility: Secondary | ICD-10-CM

## 2022-09-08 NOTE — Therapy (Signed)
OUTPATIENT PHYSICAL THERAPY NEURO TREATMENT-DISCHARGE NOTE   Patient Name: [name]. MRN: [id number] DOB:[birth year], 88 y.o., male Today's Date: 09/08/2022  PHYSICAL THERAPY DISCHARGE SUMMARY  Visits from Start of Care: 7  Current functional level related to goals / functional outcomes: Mod I with RW   Remaining deficits: Ongoing decreased balance and increased fall risk   Education / Equipment: HEP handout   Patient agrees to discharge. Patient goals were partially met. Patient is being discharged due to maximized rehab potential.     PCP: [name], MD  REFERRING PROVIDER: [name], MD    PT End of Session - 09/08/22 1451     Visit Number 7    Number of Visits 7    Date for PT Re-Evaluation 09/08/22    Authorization Type [employer] Medicare    Progress Note Due on Visit 10    PT Start Time 1450    PT Stop Time 1519   d/c   PT Time Calculation (min) 29 min    Equipment Utilized During Treatment Gait belt    Activity Tolerance Patient tolerated treatment well    Behavior During Therapy WFL for tasks assessed/performed                  Past Medical History:  Diagnosis Date   A-fib ([place])    Arthritis    CAD (coronary artery disease)    GXT, neg bruce protocol GXT   Chronic kidney disease    RENAL INSUFFICIENCY   Diverticulosis    ED (erectile dysfunction)    GERD (gastroesophageal reflux disease)    Glaucoma    Gout    no flare in years    Heart disorder    Hypertension    Obesity    Squamous cell carcinoma in situ    Past Surgical History:  Procedure Laterality Date   CATARACT EXTRACTION, BILATERAL  2018   with lens placement    PACEMAKER INSERTION  01/09/03   TONSILLECTOMY     TOTAL KNEE ARTHROPLASTY Right 02/27/2018   Procedure: RIGHT TOTAL KNEE ARTHROPLASTY;  Surgeon: Paralee Cancel, MD;  Location: [facility];  Service: Orthopedics;  Laterality: Right;  70 mins   TOTAL KNEE ARTHROPLASTY Left 09/06/2018   Procedure: LEFT TOTAL  KNEE ARTHROPLASTY;  Surgeon: Paralee Cancel, MD;  Location: [facility];  Service: Orthopedics;  Laterality: Left;  70 mins   Patient Active Problem List   Diagnosis Date Noted   Muscular deconditioning 04/27/2022   Aortic atherosclerosis ([place]) 10/29/2020   Bradycardia 09/26/2019   Gout involving toe 08/07/2019   Benign prostatic hyperplasia with urinary frequency 03/21/2019   Status post total bilateral knee replacement 09/06/2018   S/P right TKA 02/27/2018   Obesity    GERD (gastroesophageal reflux disease)    ED (erectile dysfunction)    Diverticulosis    CAD (coronary artery disease)    Permanent atrial fibrillation ([facility])    Stage 3a chronic kidney disease ([place]) 01/13/2017   Atherosclerosis 12/12/2016   Arthritis 12/01/2014   Abnormality of gait 02/05/2013   Long term (current) use of anticoagulants 12/29/2012   Glaucoma 07/26/2011   Hyperlipidemia LDL goal <70 07/26/2011   Primary hypertension 07/26/2011   PACEMAKER-St.Jude 12/26/2008    ONSET DATE: 06/17/2022   REFERRING DIAG: R53.1 (ICD-10-CM) - Decreased strength   THERAPY DIAG:  Muscle weakness (generalized)  Other abnormalities of gait and mobility  Unsteadiness on feet  Rationale for Evaluation and Treatment: Rehabilitation  SUBJECTIVE:  SUBJECTIVE STATEMENT: Pt reports his R ankle is feeling better, no swelling and no pain. No falls or other acute changes since last session.  Pt accompanied by: self  PERTINENT HISTORY: primary HTN, permanent A-fib, PACEMAKER-St.Jude, long term use of anticoagulants, stage 3a CKD, aortic atherosclerosis  PAIN:  Are you having pain? No  PRECAUTIONS: Fall and ICD/Pacemaker   PATIENT GOALS: "my balance is the main thing"  TODAY'S TREATMENT:                                                                                                                                Ther Act  LTG Assessment:  [facility] PT Assessment - 09/08/22 1457       Ambulation/Gait   Gait velocity 32.8 ft over 20 sec = 1.64 ft/sec      Standardized Balance Assessment   Standardized Balance Assessment Berg Balance Test;Timed Up and Go Test;Five Times Sit to Stand    Five times sit to stand comments  19   BUE on arms of chair     Berg Balance Test   Sit to Stand Able to stand without using hands and stabilize independently    Standing Unsupported Able to stand safely 2 minutes    Sitting with Back Unsupported but Feet Supported on Floor or Stool Able to sit safely and securely 2 minutes    Stand to Sit Sits safely with minimal use of hands    Transfers Able to transfer safely, minor use of hands    Standing Unsupported with Eyes Closed Able to stand 10 seconds safely    Standing Unsupported with Feet Together Able to place feet together independently and stand for 1 minute with supervision    From Standing, Reach Forward with Outstretched Arm Can reach forward >12 cm safely (5")    From Standing Position, Pick up Object from Floor Able to pick up shoe, needs supervision    From Standing Position, Turn to Look Behind Over each Shoulder Looks behind from both sides and weight shifts well    Turn 360 Degrees Able to turn 360 degrees safely in 4 seconds or less    Standing Unsupported, Alternately Place Feet on Step/Stool Needs assistance to keep from falling or unable to try    Standing Unsupported, One Foot in Front Able to take small step independently and hold 30 seconds    Standing on One Leg Tries to lift leg/unable to hold 3 seconds but remains standing independently    Total Score 44    Berg comment: 44/56, significant fall risk      Functional Gait  Assessment   Gait assessed  Yes    Gait Level Surface Walks 20 ft in less than 7 sec but greater than 5.5 sec, uses assistive device, slower speed, mild  gait deviations, or deviates 6-10 in outside of the 12 in walkway width.    Change in Gait Speed Able to change speed, demonstrates  mild gait deviations, deviates 6-10 in outside of the 12 in walkway width, or no gait deviations, unable to achieve a major change in velocity, or uses a change in velocity, or uses an assistive device.    Gait with Horizontal Head Turns Performs head turns with moderate changes in gait velocity, slows down, deviates 10-15 in outside 12 in walkway width but recovers, can continue to walk.    Gait with Vertical Head Turns Performs task with slight change in gait velocity (eg, minor disruption to smooth gait path), deviates 6 - 10 in outside 12 in walkway width or uses assistive device    Gait and Pivot Turn Pivot turns safely in greater than 3 sec and stops with no loss of balance, or pivot turns safely within 3 sec and stops with mild imbalance, requires small steps to catch balance.    Step Over Obstacle Cannot perform without assistance.    Gait with Narrow Base of Support Ambulates less than 4 steps heel to toe or cannot perform without assistance.    Gait with Eyes Closed Cannot walk 20 ft without assistance, severe gait deviations or imbalance, deviates greater than 15 in outside 12 in walkway width or will not attempt task.    Ambulating Backwards Walks 20 ft, slow speed, abnormal gait pattern, evidence for imbalance, deviates 10-15 in outside 12 in walkway width.    Steps Two feet to a stair, must use rail.    Total Score 11    FGA comment: 11/30, high fall risk             PATIENT EDUCATION: Education details: continue HEP, d/c from PT Person educated: Patient Education method: Customer service manager Education comprehension: verbalized understanding and needs further education  HOME EXERCISE PROGRAM: Access Code: [email or web address] URL: [email or web address] Date: 07/29/2022 Prepared by: Estevan Ryder  Exercises - Supine Bridge  - 1 x  daily - 7 x weekly - 3 sets - 10 reps - Clamshell  - 1 x daily - 7 x weekly - 3 sets - 10 reps - Sit to Stand with Counter Support  - 1 x daily - 7 x weekly - 3 sets - 10 reps - Tandem Walking with Counter Support  - 1 x daily - 7 x weekly - 3 sets - 10 reps - Heel Toe Raises with Counter Support  - 1 x daily - 7 x weekly - 3 sets - 10 reps - Backward Walking with Counter Support  - 1 x daily - 7 x weekly - 3 sets - 10 reps - Alternating Step Taps with Counter Support  - 1 x daily - 7 x weekly - 3 sets - 10 reps - Sit to Stand with Resistance Around Legs  - 1 x daily - 7 x weekly - 3 sets - 10 reps  GOALS: Goals reviewed with patient? Yes  SHORT TERM GOALS: Target date: 08/11/2022  1.  Pt will be independent with initial HEP for improved strength, balance, transfers and gait. Baseline:  Goal status: MET  2.  Pt will improve gait velocity to at least 1.75 ft/sec for improved gait efficiency and performance at mod I level  Baseline: 1.43 ft/sec (11/2), 1.6 ft/sec (11/30) Goal status: IN PROGRESS  3.  FGA to be assessed and LTG written Baseline: assessed Goal status: MET   LONG TERM GOALS: Target date: 09/08/2022  Pt will be independent with final HEP for improved strength, balance, transfers and gait. Baseline:  Goal status: MET  2.  Pt will improve gait velocity to at least 1.75 ft/sec for improved gait efficiency and performance at mod I level  Baseline: 1.43 ft/sec (11/2), 1.6 ft/sec (11/30), 1.64 ft/sec (12/28) Goal status: NOT MET  3.  Pt will improve 5 x STS to less than or equal to 20 seconds to demonstrate improved functional strength and transfer efficiency.  Baseline: 26 sec (11/2), 19 sec (12/28) Goal status: MET  4.  Pt will improve Berg score to 45/56 for decreased fall risk Baseline: 37/56 (11/2), 44/56 (12/28) Goal status: NOT MET  5.  Pt will improve FGA to >/= 11/30 to demonstrate improved balance and reduced fall risk  Baseline: 6/30, 11/30 (12/28) Goal  status: MET  6. Patient will be able to complete >/=15s on condition 4 of M-CTSIB to demonstrate improved balance   Baseline: 3s, 25 sec (12/28)  Goal Status: MET   ASSESSMENT:  CLINICAL IMPRESSION: Emphasis of skilled PT session on reassessing LTG in preparation for d/c from PT services this date. Pt has met 4/6 LTG due to being independent with his HEP, improving his 5xSTS score from 26 sec initially to 19 sec this date, improving his FGA score from 6/30 initially to 11/30 this date, and improving his score on Condition 4 of the mCTSIB to 25 sec from 3 sec initially. He improved his gait speed from 1.43 ft/sec initially to 1.64 ft/sec this date but did not improve enough to meet goal of 1.75 ft/sec. Additionally, he improved his score on the Berg from 37/56 initially to 44/56 this date but did not quite meet his goal of 45/56. Overall, pt demonstrates improved balance and decreased fall risk as evidenced by scores on functional outcome measures. Pt to d/c from PT services this date and continue with his HEP.   OBJECTIVE IMPAIRMENTS: decreased balance and decreased strength.   ACTIVITY LIMITATIONS: bed mobility  PARTICIPATION LIMITATIONS: community activity  PERSONAL FACTORS: Age, Fitness, Transportation, and 1-2 comorbidities:    primary HTN, permanent A-fib, PACEMAKER-St.Jude, long term use of anticoagulants, stage 3a CKD, aortic atherosclerosisare also affecting patient's functional outcome.   REHAB POTENTIAL: Fair sedentary lifestyle  CLINICAL DECISION MAKING: Stable/uncomplicated  EVALUATION COMPLEXITY: Low     Excell Seltzer, PT, DPT, CSRS 09/08/2022, 3:20 PM

## 2022-09-09 ENCOUNTER — Ambulatory Visit (INDEPENDENT_AMBULATORY_CARE_PROVIDER_SITE_OTHER): Payer: Medicare Other

## 2022-09-09 DIAGNOSIS — I4821 Permanent atrial fibrillation: Secondary | ICD-10-CM | POA: Diagnosis not present

## 2022-09-13 ENCOUNTER — Telehealth: Payer: Self-pay

## 2022-09-13 NOTE — Telephone Encounter (Signed)
I let the patient know that we did get his transmission.

## 2022-09-14 LAB — CUP PACEART REMOTE DEVICE CHECK
Battery Remaining Longevity: 12 mo
Battery Remaining Percentage: 10 %
Battery Voltage: 2.83 V
Brady Statistic RV Percent Paced: 51 %
Date Time Interrogation Session: 20240102104741
Implantable Lead Connection Status: 753985
Implantable Lead Implant Date: 20040429
Implantable Lead Location: 753860
Implantable Pulse Generator Implant Date: 20110325
Lead Channel Impedance Value: 410 Ohm
Lead Channel Pacing Threshold Amplitude: 0.75 V
Lead Channel Pacing Threshold Pulse Width: 0.9 ms
Lead Channel Sensing Intrinsic Amplitude: 1.2 mV
Lead Channel Setting Pacing Amplitude: 2.5 V
Lead Channel Setting Pacing Pulse Width: 0.9 ms
Lead Channel Setting Sensing Sensitivity: 0.7 mV
Pulse Gen Model: 1110
Pulse Gen Serial Number: 2313421

## 2022-09-26 ENCOUNTER — Ambulatory Visit: Payer: Medicare Other | Attending: Cardiology

## 2022-09-26 DIAGNOSIS — Z7901 Long term (current) use of anticoagulants: Secondary | ICD-10-CM

## 2022-09-26 DIAGNOSIS — Z5181 Encounter for therapeutic drug level monitoring: Secondary | ICD-10-CM

## 2022-09-26 LAB — POCT INR: INR: 3.4 — AB (ref 2.0–3.0)

## 2022-09-26 NOTE — Patient Instructions (Signed)
HOLD TONIGHT ONLY AND THEN Continue taking 2.5 mg daily except 3.75 mg each Monday. Recheck INR In 4 weeks [facility] [phone]

## 2022-09-26 NOTE — Progress Notes (Signed)
Remote pacemaker transmission.   

## 2022-10-14 ENCOUNTER — Telehealth: Payer: Self-pay

## 2022-10-14 NOTE — Progress Notes (Unsigned)
Care Management & Coordination Services Pharmacy Team  Reason for Encounter: Hypertension  Contacted patient to discuss hypertension disease state. {US HC Outreach:28874}    Current antihypertensive regimen:  Carvedilol 3.125 mg 1 tablet twice daily   Patient verbally confirms he is taking the above medications as directed. {yes/no:20286}  How often are you checking your Blood Pressure? {CHL HP BP Monitoring Frequency:[id number]}  he checks his blood pressure {timing:25218} {before/after:25217} taking his medication.  Current home BP readings: ***  DATE:             BP               PULSE   Wrist or arm cuff: Caffeine intake: Salt intake: OTC medications including pseudoephedrine or NSAIDs?  Any readings above 180/100? {yes/no:20286} If yes any symptoms of hypertensive emergency? {hypertensive emergency symptoms:25354}  What recent interventions/DTPs have been made by any provider to improve Blood Pressure control since last CPP Visit: ***  Any recent hospitalizations or ED visits since last visit with CPP? No  What diet changes have been made to improve Blood Pressure Control?  ***  What exercise is being done to improve your Blood Pressure Control?  ***  Adherence Review: Is the patient currently on ACE/ARB medication? Yes Does the patient have >5 day gap between last estimated fill dates? No  ** Advised pt that Pharm call has been changed to 12/22/22 as Pharmacist will be out on the 4th  Care Gaps:  Zoster Vaccine - Overdue COVID Booster - Overdue Flu Vaccine - Overdue AWV- 4/23     Star Rating Drug: Simvastatin (Zocor) 20 mg - Last filled 09/08/22 100 DS at [facility]    Chart Updates: Recent office visits:  None   Recent consult visits:  09/26/22  - Patient presented for Anti Coag visit. Reading was 3.4  09/08/22 Excell Seltzer, PT - Patient presented for Muscle weakness and other concerns.No medication changes.   08/29/22 - Patient presented  for Anti Coag visit. Reading was 4.2  07/11/22 - Patient presented for Anti Coag visit. Reading was 2.3  06/29/22 Jarome Matin (Dermatology) - Claims encounter for Actinic keratosis. No other visit details available.    Hospital visits:  None in previous 6 months  Medications: Outpatient Encounter Medications as of 10/14/2022  Medication Sig   acetaminophen (TYLENOL) 500 MG tablet Take 500 mg by mouth every 6 (six) hours as needed (for pain).   allopurinol (ZYLOPRIM) 100 MG tablet Take 1 tablet (100 mg total) by mouth daily.   carvedilol (COREG) 3.125 MG tablet TAKE 1 TABLET BY MOUTH TWICE  DAILY   Cholecalciferol (VITAMIN D3) 1000 units CAPS Take 2,000 Units by mouth daily.   finasteride (PROSCAR) 5 MG tablet TAKE 1 TABLET BY MOUTH EVERY DAY   latanoprost (XALATAN) 0.005 % ophthalmic solution Place 1 drop into both eyes at bedtime.    Multiple Vitamins-Minerals (PRESERVISION AREDS 2 PO) Take 1 capsule by mouth 2 (two) times daily.    simvastatin (ZOCOR) 20 MG tablet TAKE 1 TABLET BY MOUTH AT  BEDTIME   tamsulosin (FLOMAX) 0.4 MG CAPS capsule TAKE 1 CAPSULE BY MOUTH DAILY   warfarin (COUMADIN) 2.5 MG tablet TAKE 1 TO 1 AND 1/2 TABLETS BY  MOUTH DAILY AS DIRECTED BY THE  COU[facility]ADIN CLINIC   Facility-Administered Encounter Medications as of 10/14/2022  Medication   influenza  inactive virus vaccine (FLUZONE/FLUARIX) injection 0.5 mL    Recent Office Vitals: BP Readings from Last 3 Encounters:  04/27/22 138/86  04/06/22 140/90  01/04/22 (!) 142/84   Pulse Readings from Last 3 Encounters:  04/27/22 66  04/06/22 72  01/04/22 76    Wt Readings from Last 3 Encounters:  04/27/22 185 lb 9.6 oz (84.2 kg)  04/06/22 184 lb 9.6 oz (83.7 kg)  01/04/22 191 lb 12.8 oz (87 kg)     Kidney Function Lab Results  Component Value Date/Time   CREATININE 1.39 (H) 04/06/2022 02:16 PM   CREATININE 1.36 (H) 01/03/2022 02:43 PM   CREATININE 1.45 (H) 01/12/2017 08:49 AM   CREATININE 1.51 (H)  06/16/2015 12:01 AM   GFRNONAA 46 (L) 04/20/2020 01:53 PM   GFRAA 54 (L) 04/20/2020 01:53 PM       Latest Ref Rng & Units 04/06/2022    2:16 PM 01/03/2022    2:43 PM 12/31/2020    2:11 PM  BMP  Glucose 70 - 99 mg/dL 85  88  98   BUN 8 - 27 mg/dL 24  20  21   $ Creatinine 0.76 - 1.27 mg/dL 1.39  1.36  1.48   BUN/Creat Ratio 10 - 24 17  15  14   $ Sodium 134 - 144 mmol/L 144  148  143   Potassium 3.5 - 5.2 mmol/L 5.2  5.1  4.9   Chloride 96 - 106 mmol/L 106  106  103   CO2 20 - 29 mmol/L 25  24  23   $ Calcium 8.6 - 10.2 mg/dL 10.1  9.9  9.4       [place] Pharmacist Assistant [phone]

## 2022-10-17 ENCOUNTER — Other Ambulatory Visit: Payer: Self-pay | Admitting: Family Medicine

## 2022-10-17 DIAGNOSIS — M109 Gout, unspecified: Secondary | ICD-10-CM

## 2022-10-17 DIAGNOSIS — N401 Enlarged prostate with lower urinary tract symptoms: Secondary | ICD-10-CM

## 2022-10-26 ENCOUNTER — Ambulatory Visit: Payer: Medicare Other | Attending: Cardiology | Admitting: *Deleted

## 2022-10-26 DIAGNOSIS — Z7901 Long term (current) use of anticoagulants: Secondary | ICD-10-CM | POA: Diagnosis not present

## 2022-10-26 DIAGNOSIS — I4821 Permanent atrial fibrillation: Secondary | ICD-10-CM

## 2022-10-26 LAB — POCT INR: INR: 4.9 — AB (ref 2.0–3.0)

## 2022-10-26 NOTE — Patient Instructions (Addendum)
Description   DO NOT TAKE ANY WARFARIN TONIGHT AND NO WARFARIN TOMORROW THEN START taking 2.5 mg daily. Recheck INR in 3 weeks [facility] [phone]

## 2022-10-28 ENCOUNTER — Other Ambulatory Visit: Payer: Self-pay | Admitting: Internal Medicine

## 2022-10-30 ENCOUNTER — Other Ambulatory Visit: Payer: Self-pay | Admitting: Internal Medicine

## 2022-10-30 DIAGNOSIS — I4821 Permanent atrial fibrillation: Secondary | ICD-10-CM

## 2022-11-09 DIAGNOSIS — H401132 Primary open-angle glaucoma, bilateral, moderate stage: Secondary | ICD-10-CM | POA: Diagnosis not present

## 2022-11-09 DIAGNOSIS — H353131 Nonexudative age-related macular degeneration, bilateral, early dry stage: Secondary | ICD-10-CM | POA: Diagnosis not present

## 2022-11-12 ENCOUNTER — Other Ambulatory Visit: Payer: Self-pay | Admitting: Family Medicine

## 2022-11-16 ENCOUNTER — Ambulatory Visit: Payer: Medicare Other | Attending: Internal Medicine | Admitting: *Deleted

## 2022-11-16 DIAGNOSIS — Z7901 Long term (current) use of anticoagulants: Secondary | ICD-10-CM

## 2022-11-16 LAB — POCT INR: INR: 3 (ref 2.0–3.0)

## 2022-11-16 NOTE — Patient Instructions (Addendum)
Description   Today take 1.'25mg'$  (1/2 tablet) then start taking warfarin 2.'5mg'$  (1 tablet) daily except 1.'25mg'$  (1/2 tablet) on Wednesdays. Recheck INR in 3 weeks. [facility] [phone]

## 2022-12-13 ENCOUNTER — Ambulatory Visit: Payer: Medicare Other | Attending: Cardiology | Admitting: *Deleted

## 2022-12-13 ENCOUNTER — Ambulatory Visit (INDEPENDENT_AMBULATORY_CARE_PROVIDER_SITE_OTHER): Payer: Medicare Other

## 2022-12-13 DIAGNOSIS — Z7901 Long term (current) use of anticoagulants: Secondary | ICD-10-CM | POA: Diagnosis not present

## 2022-12-13 DIAGNOSIS — I4821 Permanent atrial fibrillation: Secondary | ICD-10-CM

## 2022-12-13 LAB — POCT INR: INR: 3.5 — AB (ref 2.0–3.0)

## 2022-12-13 LAB — CUP PACEART REMOTE DEVICE CHECK
Battery Remaining Longevity: 10 mo
Battery Remaining Percentage: 9 %
Battery Voltage: 2.81 V
Brady Statistic RV Percent Paced: 53 %
Date Time Interrogation Session: 20240402132201
Implantable Lead Connection Status: 753985
Implantable Lead Implant Date: 20040429
Implantable Lead Location: 753860
Implantable Pulse Generator Implant Date: 20110325
Lead Channel Impedance Value: 380 Ohm
Lead Channel Pacing Threshold Amplitude: 0.75 V
Lead Channel Pacing Threshold Pulse Width: 0.9 ms
Lead Channel Sensing Intrinsic Amplitude: 1 mV
Lead Channel Setting Pacing Amplitude: 2.5 V
Lead Channel Setting Pacing Pulse Width: 0.9 ms
Lead Channel Setting Sensing Sensitivity: 0.7 mV
Pulse Gen Model: 1110
Pulse Gen Serial Number: 2313421

## 2022-12-13 NOTE — Patient Instructions (Signed)
Description   Do not take any warfarin today then start taking warfarin 2.5mg  (1 tablet) daily except 1.25mg  (1/2 tablet) on Sundays and Wednesdays. Recheck INR in 3 weeks. [facility] [phone]

## 2022-12-21 ENCOUNTER — Telehealth: Payer: Self-pay

## 2022-12-21 NOTE — Progress Notes (Unsigned)
Care Management & Coordination Services Pharmacy Note  12/22/2022 Name:  [name]. MRN:  [id number] DOB:  [birth year]  Summary: -C/o of frequent nighttime urination (3-5 times per night), despite restricting fluids in evening and practicing double voiding -BP at goal <140/90, not able to check at home -Denies any signs of bleed with warfarin use  Recommendations/Changes made from today's visit: -INCREASE tamsulosin to 0.8mg  daily with PCP approval -Counseled on restricting salt intake and being active daily -Counseled on monitoring for bleed with warfarin and avoidance of NSAIDs  Follow up plan: BPH in 1 month Pharmacist visit in 6 months   Subjective: [name]. is an 89 y.o. year old male who is a primary patient of Mark Nian, MD.  The care coordination team was consulted for assistance with disease management and care coordination needs.    Engaged with patient by telephone for follow up visit.  Recent office visits: None  Recent consult visits: 12/13/22  - Patient presented for Anti Coag visit. Reading was 3.5. Warfarin dose 1 tab daily except 1/2 tab on Sun/Wed  11/16/22  - Patient presented for Anti Coag visit. Reading was 3.0  09/26/22  - Patient presented for Anti Coag visit. Reading was 3.4   09/08/22 Peter Congo, PT - Patient presented for Muscle weakness and other concerns.No medication changes.    08/29/22 - Patient presented for Anti Coag visit. Reading was 4.2  Hospital visits: None in previous 6 months   Objective:  Lab Results  Component Value Date   CREATININE 1.39 (H) 04/06/2022   BUN 24 04/06/2022   EGFR 48 (L) 04/06/2022   GFRNONAA 46 (L) 04/20/2020   GFRAA 54 (L) 04/20/2020   NA 144 04/06/2022   K 5.2 04/06/2022   CALCIUM 10.1 04/06/2022   CO2 25 04/06/2022   GLUCOSE 85 04/06/2022    No results found for: "HGBA1C", "FRUCTOSAMINE", "GFR", "MICROALBUR"  Last diabetic Eye exam: No results found for: "HMDIABEYEEXA"   Last diabetic Foot exam: No results found for: "HMDIABFOOTEX"   Lab Results  Component Value Date   CHOL 106 01/03/2022   HDL 38 (L) 01/03/2022   LDLCALC 52 01/03/2022   TRIG 81 01/03/2022   CHOLHDL 2.8 01/03/2022       Latest Ref Rng & Units 01/03/2022    2:43 PM 12/31/2020    2:11 PM 04/20/2020    1:53 PM  Hepatic Function  Total Protein 6.0 - 8.5 g/dL 6.4  6.5  6.7   Albumin 3.6 - 4.6 g/dL 4.3  4.3  4.3   AST 0 - 40 IU/L 19  21  22    ALT 0 - 44 IU/L 12  13  12    Alk Phosphatase 44 - 121 IU/L 92  96  92   Total Bilirubin 0.0 - 1.2 mg/dL 0.9  0.9  0.9     No results found for: "TSH", "FREET4"     Latest Ref Rng & Units 01/03/2022    2:43 PM 12/31/2020    2:11 PM 04/20/2020    1:53 PM  CBC  WBC 3.4 - 10.8 x10E3/uL 5.4  5.7  7.0   Hemoglobin 13.0 - 17.7 g/dL 00.3  70.4  88.8   Hematocrit 37.5 - 51.0 % 37.3  41.8  43.4   Platelets 150 - 450 x10E3/uL 135  134  147     No results found for: "VD25OH", "VITAMINB12"  Clinical ASCVD: No  The ASCVD Risk score (Arnett DK, et al., 2019)  failed to calculate for the following reasons:   The 2019 ASCVD risk score is only valid for ages 61 to 65        01/03/2022    1:45 PM 12/31/2020    1:31 PM 04/20/2020   11:30 AM  Depression screen PHQ 2/9  Decreased Interest 0 0 0  Down, Depressed, Hopeless 0 0 0  PHQ - 2 Score 0 0 0     Social History   Tobacco Use  Smoking Status Former   Packs/day: 1.00   Years: 10.00   Additional pack years: 0.00   Total pack years: 10.00   Types: Cigarettes   Quit date: 09/13/1979   Years since quitting: 43.3  Smokeless Tobacco Never   BP Readings from Last 3 Encounters:  04/27/22 138/86  04/06/22 140/90  01/04/22 (!) 142/84   Pulse Readings from Last 3 Encounters:  04/27/22 66  04/06/22 72  01/04/22 76   Wt Readings from Last 3 Encounters:  04/27/22 185 lb 9.6 oz (84.2 kg)  04/06/22 184 lb 9.6 oz (83.7 kg)  01/04/22 191 lb 12.8 oz (87 kg)   BMI Readings from Last 3 Encounters:   04/27/22 31.86 kg/m  04/06/22 31.69 kg/m  01/04/22 32.92 kg/m    Allergies  Allergen Reactions   Tramadol Itching    Medications Reviewed Today     Reviewed by Sherrill Raring, RPH (Pharmacist) on 12/22/22 at 0906  Med List Status: <None>   Medication Order Taking? Sig Documenting Provider Last Dose Status Informant  acetaminophen (TYLENOL) 500 MG tablet [id number] Yes Take 500 mg by mouth every 6 (six) hours as needed (for pain). [provider] Taking Active Self  allopurinol (ZYLOPRIM) 100 MG tablet [id number] Yes TAKE 1 TABLET BY MOUTH DAILY Mark Nian, MD Taking Active   carvedilol (COREG) 3.125 MG tablet [id number] Yes Take 1 tablet (3.125 mg total) by mouth 2 (two) times daily. Please keep upcoming appointment in April for future refills. Marinus Maw, MD Taking Active   Cholecalciferol (VITAMIN D3) 1000 units CAPS [id number] Yes Take 2,000 Units by mouth daily. [provider] Taking Active Self           Med Note Antony Madura, Ladona Mow Sep 17, 2018  6:17 PM)    finasteride (PROSCAR) 5 MG tablet [id number] Yes TAKE 1 TABLET BY MOUTH DAILY Mark Nian, MD Taking Active   latanoprost (XALATAN) 0.005 % ophthalmic solution [id number] Yes Place 1 drop into both eyes at bedtime.  [provider] Taking Active Self  Multiple Vitamins-Minerals (PRESERVISION AREDS 2 PO) [id number] Yes Take 1 capsule by mouth 2 (two) times daily.  [provider] Taking Active Self           Med Note Antony Madura, Ladona Mow Sep 17, 2018  6:20 PM)    simvastatin (ZOCOR) 20 MG tablet [id number] Yes TAKE 1 TABLET BY MOUTH AT  BEDTIME Mark Nian, MD Taking Active   tamsulosin (FLOMAX) 0.4 MG CAPS capsule [id number] Yes TAKE 1 CAPSULE BY MOUTH DAILY Mark Nian, MD Taking Active   warfarin (COUMADIN) 2.5 MG tablet [id number] Yes TAKE 1 TO 1 AND 1/2 TABLETS BY  MOUTH DAILY AS DIRECTED BY THE  COUMADIN CLINIC Marinus Maw, MD Taking Active             SDOH:   (Social Determinants of Health) assessments and interventions performed: Yes SDOH Interventions    Flowsheet Row Care Coordination from  12/22/2022 in [facility] Chronic Care Management from 06/16/2022 in [facility] Chronic Care Management from 07/01/2021 in [facility]  SDOH Interventions     Food Insecurity Interventions Intervention Not Indicated -- --  Transportation Interventions Intervention Not Indicated -- Intervention Not Indicated  Financial Strain Interventions -- Intervention Not Indicated Intervention Not Indicated       Medication Assistance: None required.  Patient affirms current coverage meets needs.  Medication Access: Within the past 30 days, how often has patient missed a dose of medication? None Is a pillbox or other method used to improve adherence? Yes  Factors that may affect medication adherence? no barriers identified Are meds synced by current pharmacy? No  Are meds delivered by current pharmacy? No  Does patient experience delays in picking up medications due to transportation concerns? No   [facility] Reviewed: Is patient disadvantaged to use [facility]?: Yes  Current Rx insurance plan: [facility] and [facility] of [facility]  [phone] [place], [place] - [address] [place] [address] Phone: [phone] Fax: [phone]  [facility]) - [place], [place] - [address] [place] [address] Phone: [phone] Fax: [phone]  [facility] [address] [place] [address] Phone: [phone] Fax: [phone]  [facility] - [place], [place] - [address] [place] [address] Phone: [phone] Fax: [phone]  [facility] services reviewed with patient today?: No  Patient requests  to transfer care to [facility]?: No  Reason patient declined to change pharmacies: Disadvantaged due to insurance/mail order  Compliance/Adherence/Medication fill history: Care Gaps: Zoster Vaccine - Overdue COVID Booster - Overdue AWV - 01/03/22  Star-Rating Drugs: Simvastatin 20 mg - Last filled 11/20/22 [facility]    Assessment/Plan BPH (Goal: improve urination) -Not ideally controlled -Night time urination frequency: 3+/night -Current treatment: Tamsulosin 0.4mg  once qd Finasteride 5mg  1 qd -Medications previously tried: None -Counseled on non-pharmacologic techniques such as Kegels -Counseled on restricting fluids 2-3 hours before bed and practicing double voiding. Patient confirms he already does this Recommended increasing tamsulosin to 0.8mg  daily, with PCP approval, awaiting response    Hypertension (BP goal <140/90) -Controlled -Current treatment: Carvedilol 3.125mg  BID -Medications previously tried: Benazepril/HCTZ , Doxazosin -Current home readings: Not able to check at home currently, bought a cuff but calibrated with office and readings were way off -Current dietary habits: mindful of salt intake and is consistent with greens as he knows that can impact his warfarin -Current exercise habits: Limited due to aches and pains, "moves slower than I use to" but walks when he can around the house -Denies hypotensive/hypertensive symptoms -Educated on BP goals and benefits of medications for prevention of heart attack, stroke and kidney damage; Daily salt intake goal < 2300 mg; Exercise goal of 150 minutes per week; Importance of home blood pressure monitoring; -Recommended to continue current medication  Atrial Fibrillation (Goal: prevent stroke and major bleeding) -Not ideally controlled -CHADSVASC: 4 -Current treatment: Rate control: Carvedilol 3.125mg  BID Appropriate, Effective, Safe, Accessible Anticoagulation: Warfarin 2.5mg  1 tab daily except 1/2 tab  SUN/Wed Appropriate, Effective, Safe, Accessible -Medications previously tried: None -Home BP and HR readings: see above  -Counseled on increased risk of stroke due to Afib and benefits of anticoagulation for stroke prevention; importance of adherence to anticoagulant exactly as prescribed; bleeding risk  associated with Warfarin and importance of self-monitoring for signs/symptoms of bleeding; avoidance of NSAIDs due to increased bleeding risk with anticoagulants; seeking medical attention after a head injury or if there is blood in the urine/stool; -Recommended to continue current medication  Sherrill RaringAngela H Majesty Oehlert Clinical Pharmacist [phone]

## 2022-12-21 NOTE — Progress Notes (Signed)
Patient ID: Holland Commons., male   DOB: [birth year], 89 y.o.   MRN: [id number] Care Management & Coordination Services Pharmacy Team  Reason for Encounter: Appointment Reminder  Contacted patient to confirm telephone appointment with Milas Kocher, PharmD on 12/22/22 at 9. Unsuccessful outreach. Left voicemail for patient to return call.    Star Rating Drugs:  Simvastatin 20 mg - Last filled 11/20/22 90 DS at [facility]   Care Gaps: Zoster Vaccine - Overdue COVID Booster - Overdue AWV - 01/03/22  Pamala Duffel CMA Clinical Pharmacist Assistant [phone]

## 2022-12-22 ENCOUNTER — Ambulatory Visit: Payer: Medicare Other

## 2022-12-25 ENCOUNTER — Other Ambulatory Visit: Payer: Self-pay | Admitting: Internal Medicine

## 2022-12-26 DIAGNOSIS — L570 Actinic keratosis: Secondary | ICD-10-CM | POA: Diagnosis not present

## 2022-12-29 ENCOUNTER — Telehealth: Payer: Self-pay | Admitting: Family Medicine

## 2022-12-29 ENCOUNTER — Telehealth: Payer: Self-pay

## 2022-12-29 DIAGNOSIS — N401 Enlarged prostate with lower urinary tract symptoms: Secondary | ICD-10-CM

## 2022-12-29 MED ORDER — TAMSULOSIN HCL 0.4 MG PO CAPS: 0.8000 mg | ORAL_CAPSULE | Freq: Every day | ORAL | 2 refills | Status: DC

## 2022-12-29 NOTE — Telephone Encounter (Signed)
Patient is complaining of frequent urination at night (3-5x/night).  States flomax 0.4mg  is helping some because he use to go more frequently but is requesting a higher dose.  Can an order be placed for patient to take 2 capsules daily for total dose of 0.8mg ?  Sherrill Raring Clinical Pharmacist [phone]

## 2022-12-29 NOTE — Telephone Encounter (Signed)
Pt called and would like the [facility]ax resent [facility]  [facility] states [facility] not [facility] and would be easier for him

## 2022-12-29 NOTE — Telephone Encounter (Signed)
Prescription has been transferred to [facility], per patient request. Patient informed

## 2023-01-03 ENCOUNTER — Ambulatory Visit: Payer: Medicare Other | Attending: Cardiology

## 2023-01-03 DIAGNOSIS — I4821 Permanent atrial fibrillation: Secondary | ICD-10-CM | POA: Diagnosis not present

## 2023-01-03 DIAGNOSIS — Z7901 Long term (current) use of anticoagulants: Secondary | ICD-10-CM | POA: Diagnosis not present

## 2023-01-03 LAB — POCT INR: INR: 2.2 (ref 2.0–3.0)

## 2023-01-03 NOTE — Patient Instructions (Signed)
Description   Continue on same dosage of Warfarin 2.5mg  (1 tablet) daily except 1.25mg  (1/2 tablet) on Sundays and Wednesdays. Recheck INR in 4 weeks. [facility] [phone]

## 2023-01-05 ENCOUNTER — Ambulatory Visit: Payer: Medicare Other | Admitting: Family Medicine

## 2023-01-11 ENCOUNTER — Encounter: Payer: Medicare Other | Admitting: Student

## 2023-01-12 ENCOUNTER — Ambulatory Visit: Payer: Medicare Other | Admitting: Family Medicine

## 2023-01-14 ENCOUNTER — Other Ambulatory Visit: Payer: Self-pay | Admitting: Family Medicine

## 2023-01-15 NOTE — Progress Notes (Unsigned)
Cardiology Office Note Date:  01/15/2023  Patient ID:  [name]., DOB 11-Apr-1931, MRN [id number] PCP:  Ronnald Nian, MD  Electrophysiologist: Dr. Ladona Ridgel  ***refresh   Chief Complaint: *** annual visit  History of Present Illness: [name]. is a 89 y.o. male with history of AFib (permanent), symptomatic bradycardia w/PPM, HTN  Saw Dr. Ladona Ridgel 01/04/22, doing well, more sedentary.  34mo to ERI.,  Rate controlled Afib  *** warfarin, [place] *** bleeding *** rates *** symptoms   Device information Abbott single chamber PPM implanted 01/09/2003, gen change 12/04/2009   Past Medical History:  Diagnosis Date   A-fib [facility])    Arthritis    CAD (coronary artery disease)    GXT, neg bruce protocol GXT   Chronic kidney disease    RENAL INSUFFICIENCY   Diverticulosis    ED (erectile dysfunction)    GERD (gastroesophageal reflux disease)    Glaucoma    Gout    no flare in years    Heart disorder    Hypertension    Obesity    Squamous cell carcinoma in situ     Past Surgical History:  Procedure Laterality Date   CATARACT EXTRACTION, BILATERAL  2018   with lens placement    PACEMAKER INSERTION  01/09/03   TONSILLECTOMY     TOTAL KNEE ARTHROPLASTY Right 02/27/2018   Procedure: RIGHT TOTAL KNEE ARTHROPLASTY;  Surgeon: Durene Romans, MD;  Location: [facility];  Service: Orthopedics;  Laterality: Right;  70 mins   TOTAL KNEE ARTHROPLASTY Left 09/06/2018   Procedure: LEFT TOTAL KNEE ARTHROPLASTY;  Surgeon: Durene Romans, MD;  Location: [facility];  Service: Orthopedics;  Laterality: Left;  70 mins    Current Outpatient Medications  Medication Sig Dispense Refill   acetaminophen (TYLENOL) 500 MG tablet Take 500 mg by mouth every 6 (six) hours as needed (for pain).     allopurinol (ZYLOPRIM) 100 MG tablet TAKE 1 TABLET BY MOUTH DAILY 90 tablet 1   carvedilol (COREG) 3.125 MG tablet Take 1 tablet (3.125 mg total) by mouth 2 (two) times daily with a meal. 60 tablet 0    Cholecalciferol (VITAMIN D3) 1000 units CAPS Take 2,000 Units by mouth daily.     finasteride (PROSCAR) 5 MG tablet TAKE 1 TABLET BY MOUTH DAILY 90 tablet 1   latanoprost (XALATAN) 0.005 % ophthalmic solution Place 1 drop into both eyes at bedtime.   3   Multiple Vitamins-Minerals (PRESERVISION AREDS 2 PO) Take 1 capsule by mouth 2 (two) times daily.      simvastatin (ZOCOR) 20 MG tablet TAKE 1 TABLET BY MOUTH AT  BEDTIME 90 tablet 0   tamsulosin (FLOMAX) 0.4 MG CAPS capsule Take 2 capsules (0.8 mg total) by mouth daily. 100 capsule 2   warfarin (COUMADIN) 2.5 MG tablet TAKE 1 TO 1 AND 1/2 TABLETS BY  MOUTH DAILY AS DIRECTED BY THE  COUMADIN CLINIC 150 tablet 1   No current facility-administered medications for this visit.   Facility-Administered Medications Ordered in Other Visits  Medication Dose Route Frequency Provider Last Rate Last Admin   influenza  inactive virus vaccine (FLUZONE/FLUARIX) injection 0.5 mL  0.5 mL Intramuscular Once Joselyn Arrow, MD        Allergies:   Tramadol   Social History:  The patient  reports that he quit smoking about 43 years ago. His smoking use included cigarettes. He has a 10.00 pack-year smoking history. He has never used smokeless tobacco. He  reports current alcohol use. He reports that he does not use drugs.   Family History:  The patient's family history includes Heart disease in his father and mother; Hypertension in his mother; Kidney disease in his mother; Stroke in his mother.  ROS:  Please see the history of present illness.    All other systems are reviewed and otherwise negative.   PHYSICAL EXAM:  VS:  There were no vitals taken for this visit. BMI: There is no height or weight on file to calculate BMI. Well nourished, well developed, in no acute distress HEENT: normocephalic, atraumatic Neck: no JVD, carotid bruits or masses Cardiac:  *** RRR; no significant murmurs, no rubs, or gallops Lungs:  *** CTA b/l, no wheezing, rhonchi or  rales Abd: soft, nontender MS: no deformity or *** atrophy Ext: *** no edema Skin: warm and dry, no rash Neuro:  No gross deficits appreciated Psych: euthymic mood, full affect  *** PPM site is stable, no tethering or discomfort   EKG:  Done today and reviewed by myself shows  ***  Device interrogation done today and reviewed by myself:  ***   10/10/19: TTE 1. Left ventricular ejection fraction, by visual estimation, is 50 to  55%. The left ventricle has normal function. There is mildly increased  left ventricular hypertrophy.   2. Left ventricular diastolic function could not be evaluated.   3. The left ventricle has no regional wall motion abnormalities.   4. Global right ventricle has normal systolic function.The right  ventricular size is mildly enlarged.   5. Left atrial size was severely dilated.   6. Right atrial size was severely dilated.   7. Mild mitral annular calcification.   8. The mitral valve is normal in structure. Mild mitral valve  regurgitation. No evidence of mitral stenosis.   9. The aortic valve is tricuspid. Aortic valve regurgitation is trivial.  Mild aortic valve stenosis.  10. The pulmonic valve was normal in structure. Pulmonic valve  regurgitation is trivial.  11. Aortic dilatation noted.  12. There is mild dilatation of the ascending aorta measuring 41 mm.  13. Mildly elevated pulmonary artery systolic pressure.  14. A pacer wire is visualized.  15. The inferior vena cava is normal in size with greater than 50%  respiratory variability, suggesting right atrial pressure of 3 mmHg.  16. Normal LV systolic function; mild LVH; biatrial enlargement; mild RVE;  mild AS (mean gradient 15 mmHg); trace AI; mild MR; moderate to severe TR.   Recent Labs: 04/06/2022: BUN 24; Creatinine, Ser 1.39; Potassium 5.2; Sodium 144  No results found for requested labs within last 365 days.   CrCl cannot be calculated (Patient's most recent lab result is older than  the maximum 21 days allowed.).   Wt Readings from Last 3 Encounters:  04/27/22 185 lb 9.6 oz (84.2 kg)  04/06/22 184 lb 9.6 oz (83.7 kg)  01/04/22 191 lb 12.8 oz (87 kg)     Other studies reviewed: Additional studies/records reviewed today include: summarized above  ASSESSMENT AND PLAN:  PPM ***  Permanent AFib CHA2DS2Vasc is 3, on warfarin *** rates  HTN ***  Secondary hypercoagulable state  Disposition: F/u with ***  Current medicines are reviewed at length with the patient today.  The patient did not have any concerns regarding medicines.  Norma Fredrickson, PA-C 01/15/2023 6:17 PM     [facility] [address] [place] [address] [phone] (office)  [phone] (fax)

## 2023-01-17 ENCOUNTER — Ambulatory Visit: Payer: Medicare Other | Admitting: Physician Assistant

## 2023-01-19 ENCOUNTER — Ambulatory Visit: Payer: Medicare Other | Attending: Student | Admitting: Physician Assistant

## 2023-01-19 ENCOUNTER — Encounter: Payer: Self-pay | Admitting: Physician Assistant

## 2023-01-19 VITALS — BP 124/90 | HR 65 | Ht 64.0 in | Wt 176.0 lb

## 2023-01-19 DIAGNOSIS — D6869 Other thrombophilia: Secondary | ICD-10-CM | POA: Diagnosis not present

## 2023-01-19 DIAGNOSIS — I10 Essential (primary) hypertension: Secondary | ICD-10-CM

## 2023-01-19 DIAGNOSIS — I4821 Permanent atrial fibrillation: Secondary | ICD-10-CM

## 2023-01-19 DIAGNOSIS — Z950 Presence of cardiac pacemaker: Secondary | ICD-10-CM | POA: Diagnosis not present

## 2023-01-19 LAB — CUP PACEART INCLINIC DEVICE CHECK
Battery Remaining Longevity: 10 mo
Battery Voltage: 2.81 V
Brady Statistic RV Percent Paced: 54 %
Date Time Interrogation Session: 20240509103025
Implantable Lead Connection Status: 753985
Implantable Lead Implant Date: 20040429
Implantable Lead Location: 753860
Implantable Pulse Generator Implant Date: 20110325
Lead Channel Impedance Value: 362.5 Ohm
Lead Channel Pacing Threshold Amplitude: 0.75 V
Lead Channel Pacing Threshold Amplitude: 0.75 V
Lead Channel Pacing Threshold Pulse Width: 0.9 ms
Lead Channel Pacing Threshold Pulse Width: 0.9 ms
Lead Channel Sensing Intrinsic Amplitude: 1.1 mV
Lead Channel Setting Pacing Amplitude: 2.5 V
Lead Channel Setting Pacing Pulse Width: 0.9 ms
Lead Channel Setting Sensing Sensitivity: 0.7 mV
Pulse Gen Model: 1110
Pulse Gen Serial Number: 2313421

## 2023-01-19 NOTE — Patient Instructions (Addendum)
Medication Instructions:   Your physician recommends that you continue on your current medications as directed. Please refer to the Current Medication list given to you today.   *If you need a refill on your cardiac medications before your next appointment, please call your pharmacy*   Lab Work:  CBC TODAY   If you have labs (blood work) drawn today and your tests are completely normal, you will receive your results only by: MyChart Message (if you have MyChart) OR A paper copy in the mail If you have any lab test that is abnormal or we need to change your treatment, we will call you to review the results.   Testing/Procedures: NONE ORDERED  TODAY     Follow-Up: At [facility], you and your health needs are our priority.  As part of our continuing mission to provide you with exceptional heart care, we have created designated Provider Care Teams.  These Care Teams include your primary Cardiologist (physician) and Advanced Practice Providers (APPs -  Physician Assistants and Nurse Practitioners) who all work together to provide you with the care you need, when you need it.  We recommend signing up for the patient portal called "MyChart".  Sign up information is provided on this After Visit Summary.  MyChart is used to connect with patients for Virtual Visits (Telemedicine).  Patients are able to view lab/test results, encounter notes, upcoming appointments, etc.  Non-urgent messages can be sent to your provider as well.   To learn more about what you can do with MyChart, go to [email or web address].    Your next appointment:   8 month(s)  Provider:   You may see Dr Ladona Ridgel  or one of the following Advanced Practice Providers on your designated Care Team:   Francis Dowse, [place]   Other Instructions

## 2023-01-20 LAB — CBC
Hematocrit: 37.4 % — ABNORMAL LOW (ref 37.5–51.0)
Hemoglobin: 11.6 g/dL — ABNORMAL LOW (ref 13.0–17.7)
MCH: 29.3 pg (ref 26.6–33.0)
MCHC: 31 g/dL — ABNORMAL LOW (ref 31.5–35.7)
MCV: 94 fL (ref 79–97)
Platelets: 136 10*3/uL — ABNORMAL LOW (ref 150–450)
RBC: 3.96 x10E6/uL — ABNORMAL LOW (ref 4.14–5.80)
RDW: 15 % (ref 11.6–15.4)
WBC: 5.7 10*3/uL (ref 3.4–10.8)

## 2023-01-23 ENCOUNTER — Ambulatory Visit (INDEPENDENT_AMBULATORY_CARE_PROVIDER_SITE_OTHER): Payer: Medicare Other | Admitting: Family Medicine

## 2023-01-23 ENCOUNTER — Encounter: Payer: Self-pay | Admitting: Family Medicine

## 2023-01-23 VITALS — BP 176/96 | HR 66 | Temp 97.9°F | Resp 16 | Ht 64.0 in | Wt 178.0 lb

## 2023-01-23 DIAGNOSIS — M1990 Unspecified osteoarthritis, unspecified site: Secondary | ICD-10-CM | POA: Diagnosis not present

## 2023-01-23 DIAGNOSIS — N401 Enlarged prostate with lower urinary tract symptoms: Secondary | ICD-10-CM

## 2023-01-23 DIAGNOSIS — E785 Hyperlipidemia, unspecified: Secondary | ICD-10-CM

## 2023-01-23 DIAGNOSIS — Z96653 Presence of artificial knee joint, bilateral: Secondary | ICD-10-CM

## 2023-01-23 DIAGNOSIS — M109 Gout, unspecified: Secondary | ICD-10-CM | POA: Diagnosis not present

## 2023-01-23 DIAGNOSIS — I700 Atherosclerosis of aorta: Secondary | ICD-10-CM | POA: Diagnosis not present

## 2023-01-23 DIAGNOSIS — Z950 Presence of cardiac pacemaker: Secondary | ICD-10-CM

## 2023-01-23 DIAGNOSIS — I10 Essential (primary) hypertension: Secondary | ICD-10-CM

## 2023-01-23 DIAGNOSIS — I2510 Atherosclerotic heart disease of native coronary artery without angina pectoris: Secondary | ICD-10-CM | POA: Diagnosis not present

## 2023-01-23 DIAGNOSIS — Z9849 Cataract extraction status, unspecified eye: Secondary | ICD-10-CM

## 2023-01-23 DIAGNOSIS — Z0000 Encounter for general adult medical examination without abnormal findings: Secondary | ICD-10-CM

## 2023-01-23 DIAGNOSIS — H409 Unspecified glaucoma: Secondary | ICD-10-CM | POA: Diagnosis not present

## 2023-01-23 DIAGNOSIS — Z7901 Long term (current) use of anticoagulants: Secondary | ICD-10-CM

## 2023-01-23 DIAGNOSIS — K219 Gastro-esophageal reflux disease without esophagitis: Secondary | ICD-10-CM

## 2023-01-23 DIAGNOSIS — I4821 Permanent atrial fibrillation: Secondary | ICD-10-CM | POA: Diagnosis not present

## 2023-01-23 DIAGNOSIS — R350 Frequency of micturition: Secondary | ICD-10-CM

## 2023-01-23 DIAGNOSIS — R29898 Other symptoms and signs involving the musculoskeletal system: Secondary | ICD-10-CM

## 2023-01-23 NOTE — Progress Notes (Signed)
[name]. is a 89 y.o. male who presents for annual wellness visit and follow-up on chronic medical conditions.  He continues on Flomax and finasteride but is still having some overactive bladder symptoms of waking up sometimes 3 times per night.  He continues on Coumadin for his underlying atrial fibs and is followed regularly by cardiology.  He does have a history of CAD and has a pacer.  He is also taking Coreg.  Continues on Zocor without difficulty.  He also has a history of gout and is taking allopurinol.  His reflux causes very little difficulty.  He has had bilateral TKR.  He has had direct extraction.  He is about to turn 89.  He does live with his daughter.  His physical activity is quite limited mainly because of the knees.   Immunizations and Health Maintenance Immunization History  Administered Date(s) Administered   Fluad Quad(high Dose 65+) 05/28/2019, 06/21/2022   Influenza Split 07/26/2011, 06/25/2012   Influenza Whole 08/08/2007, 06/11/2008, 06/01/2010   Influenza, High Dose Seasonal PF 06/18/2013, 06/06/2014, 05/19/2015, 05/31/2016, 05/16/2017, 06/08/2018, 06/24/2020   Influenza-Unspecified 06/21/2021   PFIZER Comirnaty(Gray Top)Covid-19 Tri-Sucrose Vaccine 12/31/2020   PFIZER(Purple Top)SARS-COV-2 Vaccination 10/07/2019, 10/28/2019, 06/22/2020   Pfizer Covid-19 Vaccine Bivalent Booster 20yrs & up 06/21/2021, 01/04/2022   Pneumococcal Conjugate-13 06/25/2015   Pneumococcal Polysaccharide-23 01/03/1998, 06/11/2008   Tdap 09/10/2008, 05/16/2017   Unspecified SARS-COV-2 Vaccination 06/21/2022   Health Maintenance Due  Topic Date Due   Zoster Vaccines- Shingrix (1 of 2) Never done   COVID-19 Vaccine (8 - 2023-24 season) 08/16/2022   Medicare Annual Wellness (AWV)  01/04/2023    Last colonoscopy: 11/18/2008 Last PSA: no record Dentist: more than 1 year ago Ophtho: within the last year Exercise: no  Other doctors caring for patient include: Cardiology- Francis Dowse   Advanced Directives:  Yes; on file  Depression screen:  See questionnaire below.        01/23/2023    3:49 PM 01/03/2022    1:45 PM 12/31/2020    1:31 PM 04/20/2020   11:30 AM 03/21/2019    9:28 AM  Depression screen PHQ 2/9  Decreased Interest 0 0 0 0 0  Down, Depressed, Hopeless 0 0 0 0 0  PHQ - 2 Score 0 0 0 0 0    Fall Screen: See Questionaire below.      01/23/2023    3:48 PM 01/03/2022    1:44 PM 12/31/2020    1:31 PM 04/20/2020   11:30 AM 03/21/2019    9:27 AM  Fall Risk   Falls in the past year? 1 1 0 0 1  Number falls in past yr: 1 0 0    Injury with Fall? 0 0 0  0  Risk for fall due to : History of fall(s);Impaired balance/gait No Fall Risks No Fall Risks Impaired balance/gait   Follow up Falls evaluation completed;Falls prevention discussed Falls evaluation completed Falls evaluation completed      ADL screen:  See questionnaire below.  Functional Status Survey: Is the patient deaf or have difficulty hearing?: No Does the patient have difficulty seeing, even when wearing glasses/contacts?: No Does the patient have difficulty concentrating, remembering, or making decisions?: Yes Does the patient have difficulty walking or climbing stairs?: Yes Does the patient have difficulty dressing or bathing?: No Does the patient have difficulty doing errands alone such as visiting a doctor's office or shopping?: Yes   Review of Systems  Constitutional: -, -unexpected weight change, -anorexia, -  fatigue Allergy: -sneezing, -itching, -congestion Dermatology: denies changing moles, rash, lumps ENT: -runny nose, -ear pain, -sore throat,  Cardiology:  -chest pain, -palpitations, -orthopnea, Respiratory: -cough, -shortness of breath, -dyspnea on exertion, -wheezing,  Gastroenterology: -abdominal pain, -nausea, -vomiting, -diarrhea, -constipation, -dysphagia Hematology: -bleeding or bruising problems Musculoskeletal: -arthralgias, -myalgias, -joint swelling, -back pain,  - Ophthalmology: -vision changes,  Urology: -dysuria, -difficulty urinating,  -urinary frequency, -urgency, incontinence Neurology: -, -numbness, , -memory loss, -falls, -dizziness    PHYSICAL EXAM:  BP (!) 176/96 (BP Location: Left Arm, Cuff Size: Normal)   Pulse 66   Temp 97.9 F (36.6 C) (Oral)   Resp 16   Ht 5\' 4"  (1.626 m)   Wt 178 lb (80.7 kg)   SpO2 97% Comment: room air  BMI 30.55 kg/m   General Appearance: Alert, cooperative, no distress, appears stated age Head: Normocephalic, without obvious abnormality, atraumatic Eyes: PERRL, conjunctiva/corneas clear, EOM's intact, Ears: Normal TM's and external ear canals Nose: Nares normal, mucosa normal, no drainage or sinus   tenderness Throat: Lips, mucosa, and tongue normal; teeth and gums normal Neck: Supple, no lymphadenopathy, thyroid:no enlargement/tenderness/nodules; no carotid bruit or JVD Lungs: Clear to auscultation bilaterally without wheezes, rales or ronchi; respirations unlabored Heart: Regular rate and rhythm, S1 and S2 normal, no murmur, rub or gallop Abdomen: Soft, non-tender, nondistended, normoactive bowel sounds, no masses, no hepatosplenomegaly Skin: Skin color, texture, turgor normal, no rashes or lesions Lymph nodes: Cervical, supraclavicular, and axillary nodes normal Neurologic: CNII-XII intact, normal strength, sensation and gait; reflexes 2+ and symmetric throughout   Psych: Normal mood, affect, hygiene and grooming  ASSESSMENT/PLAN: Routine general medical examination at a health care facility  Primary hypertension  Permanent atrial fibrillation (HCC)  Coronary artery disease involving native heart without angina pectoris, unspecified vessel or lesion type - Plan: Lipid panel, Comprehensive metabolic panel  Aortic atherosclerosis (HCC) - Plan: Lipid panel  Gastroesophageal reflux disease, unspecified whether esophagitis present  Muscular deconditioning  Gout involving toe, unspecified  cause, unspecified chronicity, unspecified laterality - Plan: Uric Acid  Arthritis  PACEM[employer]ER-[employer]  Status post total bilateral knee replacement  Long term (current) use of anticoagulants  Hyperlipidemia LDL goal <70 - Plan: Lipid panel  Benign prostatic hyperplasia with urinary frequency  Class 2 severe obesity due to excess calories with serious comorbidity and body mass index (BMI) of 35.0 to 35.9 in adult (HCC)  Glaucoma of both eyes, unspecified glaucoma type  History of cataract extraction, unspecified laterality  Discussed trying a new medication for his bladder and the sample of Myrbetriq given.  He is to stop taking the Flomax and let me know how this works.  Otherwise he will continue on his present medication regimen. Medicare Attestation I have personally reviewed: The patient's medical and social history Their use of alcohol, tobacco or illicit drugs Their current medications and supplements The patient's functional ability including ADLs,fall risks, home safety risks, cognitive, and hearing and visual impairment Diet and physical activities Evidence for depression or mood disorders  The patient's weight, height, and BMI have been recorded in the chart.  I have made referrals, counseling, and provided education to the patient based on review of the above and I have provided the patient with a written personalized care plan for preventive services.     Sharlot Gowda, MD   01/23/2023

## 2023-01-24 LAB — COMPREHENSIVE METABOLIC PANEL
ALT: 16 IU/L (ref 0–44)
AST: 24 IU/L (ref 0–40)
Albumin/Globulin Ratio: 1.8 (ref 1.2–2.2)
Albumin: 4.2 g/dL (ref 3.7–4.7)
Alkaline Phosphatase: 113 IU/L (ref 44–121)
BUN/Creatinine Ratio: 17 (ref 10–24)
BUN: 24 mg/dL (ref 8–27)
Bilirubin Total: 1.1 mg/dL (ref 0.0–1.2)
CO2: 26 mmol/L (ref 20–29)
Calcium: 9.8 mg/dL (ref 8.6–10.2)
Chloride: 106 mmol/L (ref 96–106)
Creatinine, Ser: 1.43 mg/dL — ABNORMAL HIGH (ref 0.76–1.27)
Globulin, Total: 2.3 g/dL (ref 1.5–4.5)
Glucose: 89 mg/dL (ref 70–99)
Potassium: 5.1 mmol/L (ref 3.5–5.2)
Sodium: 145 mmol/L — ABNORMAL HIGH (ref 134–144)
Total Protein: 6.5 g/dL (ref 6.0–8.5)
eGFR: 47 mL/min/{1.73_m2} — ABNORMAL LOW (ref >59)

## 2023-01-24 LAB — LIPID PANEL
Chol/HDL Ratio: 2.4 ratio (ref 0.0–5.0)
Cholesterol, Total: 96 mg/dL — ABNORMAL LOW (ref 100–199)
HDL: 40 mg/dL (ref >39)
LDL Chol Calc (NIH): 45 mg/dL (ref 0–99)
Triglycerides: 43 mg/dL (ref 0–149)
VLDL Cholesterol Cal: 11 mg/dL (ref 5–40)

## 2023-01-24 LAB — URIC ACID: Uric Acid: 4.2 mg/dL (ref 3.8–8.4)

## 2023-01-24 NOTE — Progress Notes (Signed)
Remote pacemaker transmission.   

## 2023-02-07 ENCOUNTER — Ambulatory Visit: Payer: Medicare Other | Attending: Cardiology

## 2023-02-07 DIAGNOSIS — Z7901 Long term (current) use of anticoagulants: Secondary | ICD-10-CM | POA: Diagnosis not present

## 2023-02-07 LAB — POCT INR: INR: 2.4 (ref 2.0–3.0)

## 2023-02-07 NOTE — Patient Instructions (Signed)
Continue on same dosage of Warfarin 2.5mg  (1 tablet) daily except 1.25mg  (1/2 tablet) on Sundays and Wednesdays. Recheck INR in 6 weeks. [facility] [phone]

## 2023-03-14 ENCOUNTER — Ambulatory Visit: Payer: Medicare Other

## 2023-03-14 DIAGNOSIS — R001 Bradycardia, unspecified: Secondary | ICD-10-CM

## 2023-03-15 LAB — CUP PACEART REMOTE DEVICE CHECK
Battery Remaining Longevity: 8 mo
Battery Remaining Percentage: 7 %
Battery Voltage: 2.78 V
Brady Statistic RV Percent Paced: 61 %
Date Time Interrogation Session: 20240702111706
Implantable Lead Connection Status: 753985
Implantable Lead Implant Date: 20040429
Implantable Lead Location: 753860
Implantable Pulse Generator Implant Date: 20110325
Lead Channel Impedance Value: 340 Ohm
Lead Channel Pacing Threshold Amplitude: 0.75 V
Lead Channel Pacing Threshold Pulse Width: 0.9 ms
Lead Channel Sensing Intrinsic Amplitude: 0.9 mV
Lead Channel Setting Pacing Amplitude: 2.5 V
Lead Channel Setting Pacing Pulse Width: 0.9 ms
Lead Channel Setting Sensing Sensitivity: 0.7 mV
Pulse Gen Model: 1110
Pulse Gen Serial Number: 2313421

## 2023-03-21 ENCOUNTER — Ambulatory Visit: Payer: Medicare Other

## 2023-03-21 DIAGNOSIS — Z7901 Long term (current) use of anticoagulants: Secondary | ICD-10-CM

## 2023-03-21 LAB — POCT INR: INR: 2 (ref 2.0–3.0)

## 2023-03-21 NOTE — Patient Instructions (Signed)
Continue on same dosage of Warfarin 2.5mg (1 tablet) daily except 1.25mg (1/2 tablet) on Sundays and Wednesdays. Recheck INR in 6 weeks. [facility] [phone] 

## 2023-04-07 NOTE — Progress Notes (Signed)
Remote pacemaker transmission.   

## 2023-04-19 ENCOUNTER — Other Ambulatory Visit: Payer: Self-pay | Admitting: Family Medicine

## 2023-04-19 DIAGNOSIS — M109 Gout, unspecified: Secondary | ICD-10-CM

## 2023-04-24 ENCOUNTER — Other Ambulatory Visit: Payer: Self-pay | Admitting: Family Medicine

## 2023-04-24 DIAGNOSIS — N401 Enlarged prostate with lower urinary tract symptoms: Secondary | ICD-10-CM

## 2023-05-02 ENCOUNTER — Ambulatory Visit: Payer: Medicare Other | Attending: Cardiology

## 2023-05-02 DIAGNOSIS — Z7901 Long term (current) use of anticoagulants: Secondary | ICD-10-CM

## 2023-05-02 LAB — POCT INR: INR: 2.6 (ref 2.0–3.0)

## 2023-05-02 NOTE — Patient Instructions (Signed)
Continue on same dosage of Warfarin 2.5mg (1 tablet) daily except 1.25mg (1/2 tablet) on Sundays and Wednesdays. Recheck INR in 6 weeks. [facility] [phone] 

## 2023-05-10 DIAGNOSIS — Z961 Presence of intraocular lens: Secondary | ICD-10-CM | POA: Diagnosis not present

## 2023-05-10 DIAGNOSIS — H401131 Primary open-angle glaucoma, bilateral, mild stage: Secondary | ICD-10-CM | POA: Diagnosis not present

## 2023-05-10 DIAGNOSIS — H52203 Unspecified astigmatism, bilateral: Secondary | ICD-10-CM | POA: Diagnosis not present

## 2023-05-10 DIAGNOSIS — H353132 Nonexudative age-related macular degeneration, bilateral, intermediate dry stage: Secondary | ICD-10-CM | POA: Diagnosis not present

## 2023-06-13 ENCOUNTER — Ambulatory Visit: Payer: Medicare Other

## 2023-06-13 DIAGNOSIS — I4821 Permanent atrial fibrillation: Secondary | ICD-10-CM

## 2023-06-14 ENCOUNTER — Telehealth: Payer: Self-pay

## 2023-06-14 LAB — CUP PACEART REMOTE DEVICE CHECK
Battery Remaining Longevity: 1 mo
Battery Remaining Percentage: 4 %
Battery Voltage: 2.72 V
Brady Statistic RV Percent Paced: 58 %
Date Time Interrogation Session: 20241002140156
Implantable Lead Connection Status: 753985
Implantable Lead Implant Date: 20040429
Implantable Lead Location: 753860
Implantable Pulse Generator Implant Date: 20110325
Lead Channel Impedance Value: 210 Ohm
Lead Channel Pacing Threshold Amplitude: 0.75 V
Lead Channel Pacing Threshold Pulse Width: 0.9 ms
Lead Channel Sensing Intrinsic Amplitude: 3.1 mV
Lead Channel Setting Pacing Amplitude: 5 V
Lead Channel Setting Pacing Pulse Width: 0.9 ms
Lead Channel Setting Sensing Sensitivity: 2 mV
Pulse Gen Model: 1110
Pulse Gen Serial Number: 2313421

## 2023-06-14 NOTE — Telephone Encounter (Signed)
Patients pacemaker battery is nearing ERI with estimate 1 month. Scheduled in EPIC. Patient called and made aware.

## 2023-06-19 ENCOUNTER — Ambulatory Visit: Payer: Medicare Other | Attending: Cardiology

## 2023-06-19 DIAGNOSIS — Z7901 Long term (current) use of anticoagulants: Secondary | ICD-10-CM | POA: Diagnosis not present

## 2023-06-19 LAB — POCT INR: INR: 3 (ref 2.0–3.0)

## 2023-06-19 NOTE — Patient Instructions (Signed)
Continue on same dosage of Warfarin 2.5mg (1 tablet) daily except 1.25mg (1/2 tablet) on Sundays and Wednesdays. Recheck INR in 6 weeks. [facility] [phone] 

## 2023-06-27 NOTE — Progress Notes (Signed)
Remote pacemaker transmission.   

## 2023-07-04 ENCOUNTER — Telehealth: Payer: Self-pay | Admitting: Family Medicine

## 2023-07-04 ENCOUNTER — Encounter: Payer: Self-pay | Admitting: Family Medicine

## 2023-07-04 DIAGNOSIS — I129 Hypertensive chronic kidney disease with stage 1 through stage 4 chronic kidney disease, or unspecified chronic kidney disease: Secondary | ICD-10-CM | POA: Diagnosis not present

## 2023-07-04 DIAGNOSIS — R600 Localized edema: Secondary | ICD-10-CM | POA: Diagnosis not present

## 2023-07-04 DIAGNOSIS — I878 Other specified disorders of veins: Secondary | ICD-10-CM | POA: Diagnosis not present

## 2023-07-04 DIAGNOSIS — Z7901 Long term (current) use of anticoagulants: Secondary | ICD-10-CM | POA: Diagnosis not present

## 2023-07-04 DIAGNOSIS — N183 Chronic kidney disease, stage 3 unspecified: Secondary | ICD-10-CM | POA: Diagnosis not present

## 2023-07-04 DIAGNOSIS — I872 Venous insufficiency (chronic) (peripheral): Secondary | ICD-10-CM | POA: Diagnosis not present

## 2023-07-04 NOTE — Telephone Encounter (Signed)
[name], [phone] , daughter called they are out of town until Sunday  She states that the skin on his  legs is normally discolored but today one of them is "very Purple"  It is also swollen and it is warm to touch   . He does state it is painful when he takes first few steps but that it gets better  She wants to talk to you to see what she should do

## 2023-07-05 NOTE — Telephone Encounter (Signed)
They went to ER but they are coming in Tuesday for follow up

## 2023-07-11 ENCOUNTER — Encounter: Payer: Self-pay | Admitting: Family Medicine

## 2023-07-11 ENCOUNTER — Ambulatory Visit (INDEPENDENT_AMBULATORY_CARE_PROVIDER_SITE_OTHER): Payer: Medicare Other | Admitting: Family Medicine

## 2023-07-11 VITALS — BP 128/82 | HR 78 | Ht 64.0 in | Wt 188.4 lb

## 2023-07-11 DIAGNOSIS — L03119 Cellulitis of unspecified part of limb: Secondary | ICD-10-CM

## 2023-07-11 DIAGNOSIS — Z23 Encounter for immunization: Secondary | ICD-10-CM | POA: Diagnosis not present

## 2023-07-11 DIAGNOSIS — E785 Hyperlipidemia, unspecified: Secondary | ICD-10-CM | POA: Diagnosis not present

## 2023-07-11 MED ORDER — DOXYCYCLINE HYCLATE 100 MG PO TABS: 100.0000 mg | ORAL_TABLET | Freq: Two times a day (BID) | ORAL | 0 refills | Status: DC

## 2023-07-11 MED ORDER — SIMVASTATIN 20 MG PO TABS
20.0000 mg | ORAL_TABLET | Freq: Every day | ORAL | 1 refills | Status: DC
Start: 2023-07-11 — End: 2023-07-12

## 2023-07-11 NOTE — Progress Notes (Signed)
Subjective:    Patient ID: [name]., male    DOB: 03/25/1931, 89 y.o.   MRN: [id number]  HPI He is here for evaluation of right leg swelling.  He has had difficulty with edema for quite some time and is support stockings that he sometimes uses.  He states that for the last month he has noted the right leg to be more swollen and apparently got worse over the last week or so.  He was seen at [facility] and they did an ultrasound on the leg as well as EKG and blood work all of which was apparently normal.  He was given a topical medication.  The daughter to take a picture of the leg.  It did appear to be slightly erythematous.  Today it is not hot but still tender.   Review of Systems     Objective:    Physical Exam Exam of the right lower extremity does show edema.  It was measured at 17 inches at the calf.  He is tender to palpation over the calf muscle as well as anteriorly.  Is slightly brownish but not erythematous. The notes from [facility] emergency room were reviewed.      Assessment & Plan:  Cellulitis of calf - Plan: doxycycline (VIBRA-TABS) 100 MG tablet  Need for influenza vaccination - Plan: Flu Vaccine Trivalent High Dose (Fluad)  Need for COVID-19 vaccine - Plan: Pfizer Comirnaty Covid -19 Vaccine 37yrs and older  Hyperlipidemia LDL goal <70 - Plan: simvastatin (ZOCOR) 20 MG tablet I think the leg demonstrates cellulitis.  I will place him on doxycycline. He is to keep his leg elevated and even use an Ace wrap if needed to help with the swelling and return here in 2 weeks for follow-up.

## 2023-07-11 NOTE — Patient Instructions (Signed)
Keep your leg elevated Use a compression stocking if he cannot get it on then put an Ace wrap on

## 2023-07-12 ENCOUNTER — Other Ambulatory Visit: Payer: Self-pay | Admitting: Family Medicine

## 2023-07-12 DIAGNOSIS — L03119 Cellulitis of unspecified part of limb: Secondary | ICD-10-CM

## 2023-07-12 DIAGNOSIS — E785 Hyperlipidemia, unspecified: Secondary | ICD-10-CM

## 2023-07-12 MED ORDER — DOXYCYCLINE HYCLATE 100 MG PO TABS
100.0000 mg | ORAL_TABLET | Freq: Two times a day (BID) | ORAL | 0 refills | Status: DC
Start: 2023-07-12 — End: 2023-08-01

## 2023-07-12 MED ORDER — SIMVASTATIN 20 MG PO TABS
20.0000 mg | ORAL_TABLET | Freq: Every day | ORAL | 1 refills | Status: DC
Start: 2023-07-12 — End: 2023-08-21

## 2023-07-17 ENCOUNTER — Ambulatory Visit (INDEPENDENT_AMBULATORY_CARE_PROVIDER_SITE_OTHER): Payer: Medicare Other

## 2023-07-17 DIAGNOSIS — I4821 Permanent atrial fibrillation: Secondary | ICD-10-CM

## 2023-07-20 LAB — CUP PACEART REMOTE DEVICE CHECK
Battery Remaining Longevity: 1 mo
Battery Remaining Percentage: 5 %
Battery Voltage: 2.75 V
Brady Statistic RV Percent Paced: 53 %
Date Time Interrogation Session: 20241106112616
Implantable Lead Connection Status: 753985
Implantable Lead Implant Date: 20040429
Implantable Lead Location: 753860
Implantable Pulse Generator Implant Date: 20110325
Lead Channel Impedance Value: 200 Ohm
Lead Channel Pacing Threshold Amplitude: 0.75 V
Lead Channel Pacing Threshold Pulse Width: 0.9 ms
Lead Channel Sensing Intrinsic Amplitude: 3 mV
Lead Channel Setting Pacing Amplitude: 5 V
Lead Channel Setting Pacing Pulse Width: 0.9 ms
Lead Channel Setting Sensing Sensitivity: 2 mV
Pulse Gen Model: 1110
Pulse Gen Serial Number: 2313421

## 2023-07-31 ENCOUNTER — Ambulatory Visit: Payer: Medicare Other | Attending: Cardiology

## 2023-07-31 DIAGNOSIS — Z7901 Long term (current) use of anticoagulants: Secondary | ICD-10-CM

## 2023-07-31 LAB — POCT INR: INR: 4.4 — AB (ref 2.0–3.0)

## 2023-07-31 NOTE — Patient Instructions (Signed)
HOLD TODAY and TUESDAY THEN Continue on same dosage of Warfarin 2.5mg  (1 tablet) daily except 1.25mg  (1/2 tablet) on Sundays and Wednesdays. Recheck INR in 2 weeks. [facility] [phone]

## 2023-08-01 ENCOUNTER — Ambulatory Visit
Admission: RE | Admit: 2023-08-01 | Discharge: 2023-08-01 | Disposition: A | Discharge status: Home / Self Care | Payer: Medicare Other | Source: Ambulatory Visit | Attending: Family Medicine | Admitting: Family Medicine

## 2023-08-01 ENCOUNTER — Ambulatory Visit: Payer: Medicare Other | Admitting: Family Medicine

## 2023-08-01 VITALS — BP 122/64 | HR 54 | Ht 64.0 in | Wt 188.0 lb

## 2023-08-01 DIAGNOSIS — Z85828 Personal history of other malignant neoplasm of skin: Secondary | ICD-10-CM | POA: Diagnosis not present

## 2023-08-01 DIAGNOSIS — R609 Edema, unspecified: Secondary | ICD-10-CM

## 2023-08-01 DIAGNOSIS — L570 Actinic keratosis: Secondary | ICD-10-CM | POA: Diagnosis not present

## 2023-08-01 DIAGNOSIS — Z950 Presence of cardiac pacemaker: Secondary | ICD-10-CM

## 2023-08-01 DIAGNOSIS — L03119 Cellulitis of unspecified part of limb: Secondary | ICD-10-CM

## 2023-08-01 DIAGNOSIS — I4821 Permanent atrial fibrillation: Secondary | ICD-10-CM | POA: Diagnosis not present

## 2023-08-01 DIAGNOSIS — I350 Nonrheumatic aortic (valve) stenosis: Secondary | ICD-10-CM | POA: Diagnosis not present

## 2023-08-01 DIAGNOSIS — D692 Other nonthrombocytopenic purpura: Secondary | ICD-10-CM | POA: Diagnosis not present

## 2023-08-01 DIAGNOSIS — R0602 Shortness of breath: Secondary | ICD-10-CM | POA: Diagnosis not present

## 2023-08-01 DIAGNOSIS — L821 Other seborrheic keratosis: Secondary | ICD-10-CM | POA: Diagnosis not present

## 2023-08-01 DIAGNOSIS — R918 Other nonspecific abnormal finding of lung field: Secondary | ICD-10-CM | POA: Diagnosis not present

## 2023-08-01 DIAGNOSIS — I517 Cardiomegaly: Secondary | ICD-10-CM | POA: Diagnosis not present

## 2023-08-01 DIAGNOSIS — R1310 Dysphagia, unspecified: Secondary | ICD-10-CM | POA: Diagnosis not present

## 2023-08-01 MED ORDER — FUROSEMIDE 20 MG PO TABS: 20.0000 mg | ORAL_TABLET | Freq: Every day | ORAL | 3 refills | Status: DC

## 2023-08-01 NOTE — Progress Notes (Signed)
Remote pacemaker transmission.   

## 2023-08-01 NOTE — Addendum Note (Signed)
Addended by: Geralyn Flash D on: 08/01/2023 03:11 PM   Modules accepted: Level of Service

## 2023-08-01 NOTE — Progress Notes (Signed)
   Subjective:    Patient ID: Mark Commons., male    DOB: 01-29-1931, 89 y.o.   MRN: [id number]  HPI He is here for a recheck.  He has had a several pound weight gain and has also noted some shortness of breath and some slight dyspnea on exertion.  He does have a previous history of aortic stenosis.  Essentially says that both legs have swollen.  At the end of the encounter he then mention difficulty with swallowing and noting that solid foods get stuck high in the esophagus however liquids do go down.  He failed to mention this on any previous encounters.  He does have a history of atrial fibs and does have a pacer.   Review of Systems     Objective:    Physical Exam Alert and respiratory rate of 40.  Cardiac exam does show a grade 2/6 to 3/6 systolic murmur.  The rate is irregular.  Lungs show questionable decreased right basilar sounds.  Lower extremities show 3+ pitting edema.  The right leg does have some blebs on it but is not erythematous warm or hot.       Assessment & Plan:  Nonrheumatic aortic valve stenosis - Plan: DG Chest 2 View, ECHOCARDIOGRAM COMPLETE  Cellulitis of calf - Plan: Ambulatory referral to Pain Clinic, furosemide (LASIX) 20 MG tablet  Permanent atrial fibrillation (HCC)  Cardiac pacemaker in situ  Dependent edema  Dysphagia, unspecified type - Plan: Ambulatory referral to Gastroenterology At this time I think we need to pursue multiple Amadou's.  We need to evaluate his heart more detail with an echo as well as check his chest x-ray, give him Lasix to help with fluid that could possibly be also a CHF.  I will refer him to GI to further evaluate the dysphagia.  Discussed esophageal stenosis with him.  Recheck here in roughly 10 days.  His daughter was with him and this was all discussed with her as well.

## 2023-08-02 ENCOUNTER — Encounter: Payer: Self-pay | Admitting: Family Medicine

## 2023-08-02 DIAGNOSIS — L03119 Cellulitis of unspecified part of limb: Secondary | ICD-10-CM

## 2023-08-03 ENCOUNTER — Other Ambulatory Visit: Payer: Self-pay | Admitting: Internal Medicine

## 2023-08-03 DIAGNOSIS — I350 Nonrheumatic aortic (valve) stenosis: Secondary | ICD-10-CM

## 2023-08-03 DIAGNOSIS — I4821 Permanent atrial fibrillation: Secondary | ICD-10-CM

## 2023-08-07 ENCOUNTER — Ambulatory Visit: Payer: Medicare Other | Admitting: Family Medicine

## 2023-08-07 NOTE — Progress Notes (Signed)
Called [facility] & left message.

## 2023-08-08 NOTE — Progress Notes (Signed)
I left message yesterday at GI & left message today for Katie at GI

## 2023-08-08 NOTE — Progress Notes (Signed)
I called & had them do urgent read, sent to [facility]

## 2023-08-13 ENCOUNTER — Emergency Department (HOSPITAL_COMMUNITY): Payer: Medicare Other

## 2023-08-13 ENCOUNTER — Inpatient Hospital Stay (HOSPITAL_COMMUNITY)
Admission: EM | Admit: 2023-08-13 | Discharge: 2023-08-21 | DRG: 291 | Disposition: A | Discharge status: Hospice | Payer: Medicare Other | Attending: Internal Medicine | Admitting: Internal Medicine

## 2023-08-13 DIAGNOSIS — Z885 Allergy status to narcotic agent status: Secondary | ICD-10-CM

## 2023-08-13 DIAGNOSIS — I509 Heart failure, unspecified: Secondary | ICD-10-CM | POA: Diagnosis not present

## 2023-08-13 DIAGNOSIS — E860 Dehydration: Secondary | ICD-10-CM | POA: Diagnosis present

## 2023-08-13 DIAGNOSIS — Z79899 Other long term (current) drug therapy: Secondary | ICD-10-CM

## 2023-08-13 DIAGNOSIS — I5031 Acute diastolic (congestive) heart failure: Secondary | ICD-10-CM | POA: Diagnosis not present

## 2023-08-13 DIAGNOSIS — S0101XA Laceration without foreign body of scalp, initial encounter: Secondary | ICD-10-CM | POA: Diagnosis present

## 2023-08-13 DIAGNOSIS — R54 Age-related physical debility: Secondary | ICD-10-CM | POA: Diagnosis present

## 2023-08-13 DIAGNOSIS — I517 Cardiomegaly: Secondary | ICD-10-CM | POA: Diagnosis not present

## 2023-08-13 DIAGNOSIS — M47816 Spondylosis without myelopathy or radiculopathy, lumbar region: Secondary | ICD-10-CM | POA: Diagnosis not present

## 2023-08-13 DIAGNOSIS — J90 Pleural effusion, not elsewhere classified: Secondary | ICD-10-CM | POA: Diagnosis not present

## 2023-08-13 DIAGNOSIS — Z043 Encounter for examination and observation following other accident: Secondary | ICD-10-CM | POA: Diagnosis not present

## 2023-08-13 DIAGNOSIS — Z471 Aftercare following joint replacement surgery: Secondary | ICD-10-CM | POA: Diagnosis not present

## 2023-08-13 DIAGNOSIS — Y92009 Unspecified place in unspecified non-institutional (private) residence as the place of occurrence of the external cause: Secondary | ICD-10-CM | POA: Diagnosis not present

## 2023-08-13 DIAGNOSIS — Z950 Presence of cardiac pacemaker: Secondary | ICD-10-CM | POA: Diagnosis not present

## 2023-08-13 DIAGNOSIS — M8580 Other specified disorders of bone density and structure, unspecified site: Secondary | ICD-10-CM | POA: Diagnosis not present

## 2023-08-13 DIAGNOSIS — E8770 Fluid overload, unspecified: Secondary | ICD-10-CM | POA: Diagnosis not present

## 2023-08-13 DIAGNOSIS — I130 Hypertensive heart and chronic kidney disease with heart failure and stage 1 through stage 4 chronic kidney disease, or unspecified chronic kidney disease: Secondary | ICD-10-CM | POA: Diagnosis not present

## 2023-08-13 DIAGNOSIS — E66811 Obesity, class 1: Secondary | ICD-10-CM | POA: Diagnosis present

## 2023-08-13 DIAGNOSIS — I878 Other specified disorders of veins: Secondary | ICD-10-CM | POA: Diagnosis present

## 2023-08-13 DIAGNOSIS — R079 Chest pain, unspecified: Secondary | ICD-10-CM | POA: Diagnosis not present

## 2023-08-13 DIAGNOSIS — L03115 Cellulitis of right lower limb: Secondary | ICD-10-CM | POA: Diagnosis not present

## 2023-08-13 DIAGNOSIS — N1831 Chronic kidney disease, stage 3a: Secondary | ICD-10-CM | POA: Diagnosis present

## 2023-08-13 DIAGNOSIS — M25561 Pain in right knee: Secondary | ICD-10-CM | POA: Diagnosis present

## 2023-08-13 DIAGNOSIS — Y92239 Unspecified place in hospital as the place of occurrence of the external cause: Secondary | ICD-10-CM | POA: Diagnosis not present

## 2023-08-13 DIAGNOSIS — G8929 Other chronic pain: Secondary | ICD-10-CM | POA: Diagnosis not present

## 2023-08-13 DIAGNOSIS — R918 Other nonspecific abnormal finding of lung field: Secondary | ICD-10-CM | POA: Diagnosis not present

## 2023-08-13 DIAGNOSIS — W19XXXA Unspecified fall, initial encounter: Secondary | ICD-10-CM | POA: Diagnosis not present

## 2023-08-13 DIAGNOSIS — I2510 Atherosclerotic heart disease of native coronary artery without angina pectoris: Secondary | ICD-10-CM | POA: Diagnosis not present

## 2023-08-13 DIAGNOSIS — Z841 Family history of disorders of kidney and ureter: Secondary | ICD-10-CM

## 2023-08-13 DIAGNOSIS — E785 Hyperlipidemia, unspecified: Secondary | ICD-10-CM | POA: Diagnosis not present

## 2023-08-13 DIAGNOSIS — L304 Erythema intertrigo: Secondary | ICD-10-CM | POA: Diagnosis not present

## 2023-08-13 DIAGNOSIS — Z6832 Body mass index (BMI) 32.0-32.9, adult: Secondary | ICD-10-CM | POA: Diagnosis not present

## 2023-08-13 DIAGNOSIS — T502X5A Adverse effect of carbonic-anhydrase inhibitors, benzothiadiazides and other diuretics, initial encounter: Secondary | ICD-10-CM | POA: Diagnosis not present

## 2023-08-13 DIAGNOSIS — R1310 Dysphagia, unspecified: Secondary | ICD-10-CM | POA: Diagnosis not present

## 2023-08-13 DIAGNOSIS — I4821 Permanent atrial fibrillation: Secondary | ICD-10-CM | POA: Diagnosis not present

## 2023-08-13 DIAGNOSIS — S0990XA Unspecified injury of head, initial encounter: Secondary | ICD-10-CM | POA: Diagnosis not present

## 2023-08-13 DIAGNOSIS — K529 Noninfective gastroenteritis and colitis, unspecified: Secondary | ICD-10-CM | POA: Diagnosis present

## 2023-08-13 DIAGNOSIS — N179 Acute kidney failure, unspecified: Secondary | ICD-10-CM | POA: Diagnosis not present

## 2023-08-13 DIAGNOSIS — Z96653 Presence of artificial knee joint, bilateral: Secondary | ICD-10-CM | POA: Diagnosis present

## 2023-08-13 DIAGNOSIS — Z823 Family history of stroke: Secondary | ICD-10-CM

## 2023-08-13 DIAGNOSIS — Z87891 Personal history of nicotine dependence: Secondary | ICD-10-CM | POA: Diagnosis not present

## 2023-08-13 DIAGNOSIS — Z86008 Personal history of in-situ neoplasm of other site: Secondary | ICD-10-CM

## 2023-08-13 DIAGNOSIS — R2681 Unsteadiness on feet: Secondary | ICD-10-CM | POA: Diagnosis present

## 2023-08-13 DIAGNOSIS — E8779 Other fluid overload: Secondary | ICD-10-CM | POA: Diagnosis not present

## 2023-08-13 DIAGNOSIS — D696 Thrombocytopenia, unspecified: Secondary | ICD-10-CM | POA: Diagnosis not present

## 2023-08-13 DIAGNOSIS — M109 Gout, unspecified: Secondary | ICD-10-CM | POA: Diagnosis not present

## 2023-08-13 DIAGNOSIS — E46 Unspecified protein-calorie malnutrition: Secondary | ICD-10-CM | POA: Diagnosis present

## 2023-08-13 DIAGNOSIS — I5021 Acute systolic (congestive) heart failure: Secondary | ICD-10-CM | POA: Diagnosis present

## 2023-08-13 DIAGNOSIS — S3993XA Unspecified injury of pelvis, initial encounter: Secondary | ICD-10-CM | POA: Diagnosis not present

## 2023-08-13 DIAGNOSIS — Z7901 Long term (current) use of anticoagulants: Secondary | ICD-10-CM

## 2023-08-13 DIAGNOSIS — N400 Enlarged prostate without lower urinary tract symptoms: Secondary | ICD-10-CM | POA: Diagnosis present

## 2023-08-13 DIAGNOSIS — Z515 Encounter for palliative care: Secondary | ICD-10-CM | POA: Diagnosis not present

## 2023-08-13 DIAGNOSIS — M25559 Pain in unspecified hip: Secondary | ICD-10-CM | POA: Diagnosis not present

## 2023-08-13 DIAGNOSIS — I480 Paroxysmal atrial fibrillation: Secondary | ICD-10-CM | POA: Insufficient documentation

## 2023-08-13 DIAGNOSIS — Z96651 Presence of right artificial knee joint: Secondary | ICD-10-CM | POA: Diagnosis not present

## 2023-08-13 DIAGNOSIS — I110 Hypertensive heart disease with heart failure: Secondary | ICD-10-CM | POA: Diagnosis not present

## 2023-08-13 DIAGNOSIS — R5381 Other malaise: Secondary | ICD-10-CM | POA: Diagnosis not present

## 2023-08-13 DIAGNOSIS — Z8249 Family history of ischemic heart disease and other diseases of the circulatory system: Secondary | ICD-10-CM

## 2023-08-13 DIAGNOSIS — Z66 Do not resuscitate: Secondary | ICD-10-CM | POA: Diagnosis present

## 2023-08-13 DIAGNOSIS — S199XXA Unspecified injury of neck, initial encounter: Secondary | ICD-10-CM | POA: Diagnosis not present

## 2023-08-13 DIAGNOSIS — I10 Essential (primary) hypertension: Secondary | ICD-10-CM | POA: Diagnosis present

## 2023-08-13 DIAGNOSIS — Z9842 Cataract extraction status, left eye: Secondary | ICD-10-CM

## 2023-08-13 DIAGNOSIS — Z9841 Cataract extraction status, right eye: Secondary | ICD-10-CM

## 2023-08-13 DIAGNOSIS — S0003XA Contusion of scalp, initial encounter: Secondary | ICD-10-CM | POA: Diagnosis not present

## 2023-08-13 DIAGNOSIS — R531 Weakness: Secondary | ICD-10-CM | POA: Diagnosis not present

## 2023-08-13 DIAGNOSIS — Z7401 Bed confinement status: Secondary | ICD-10-CM | POA: Diagnosis not present

## 2023-08-13 LAB — COMPREHENSIVE METABOLIC PANEL
ALT: 12 U/L (ref 0–44)
AST: 27 U/L (ref 15–41)
Albumin: 3.2 g/dL — ABNORMAL LOW (ref 3.5–5.0)
Alkaline Phosphatase: 86 U/L (ref 38–126)
Anion gap: 8 (ref 5–15)
BUN: 26 mg/dL — ABNORMAL HIGH (ref 8–23)
CO2: 24 mmol/L (ref 22–32)
Calcium: 9.2 mg/dL (ref 8.9–10.3)
Chloride: 110 mmol/L (ref 98–111)
Creatinine, Ser: 1.58 mg/dL — ABNORMAL HIGH (ref 0.61–1.24)
GFR, Estimated: 41 mL/min — ABNORMAL LOW (ref >60)
Glucose, Bld: 118 mg/dL — ABNORMAL HIGH (ref 70–99)
Potassium: 4.3 mmol/L (ref 3.5–5.1)
Sodium: 142 mmol/L (ref 135–145)
Total Bilirubin: 1.1 mg/dL (ref <1.2)
Total Protein: 6 g/dL — ABNORMAL LOW (ref 6.5–8.1)

## 2023-08-13 LAB — I-STAT CHEM 8, ED
BUN: 27 mg/dL — ABNORMAL HIGH (ref 8–23)
Calcium, Ion: 1.1 mmol/L — ABNORMAL LOW (ref 1.15–1.40)
Chloride: 108 mmol/L (ref 98–111)
Creatinine, Ser: 1.6 mg/dL — ABNORMAL HIGH (ref 0.61–1.24)
Glucose, Bld: 114 mg/dL — ABNORMAL HIGH (ref 70–99)
HCT: 37 % — ABNORMAL LOW (ref 39.0–52.0)
Hemoglobin: 12.6 g/dL — ABNORMAL LOW (ref 13.0–17.0)
Potassium: 4.2 mmol/L (ref 3.5–5.1)
Sodium: 142 mmol/L (ref 135–145)
TCO2: 23 mmol/L (ref 22–32)

## 2023-08-13 LAB — PROTIME-INR
INR: 2.3 — ABNORMAL HIGH (ref 0.8–1.2)
Prothrombin Time: 25.2 s — ABNORMAL HIGH (ref 11.4–15.2)

## 2023-08-13 LAB — URINALYSIS, ROUTINE W REFLEX MICROSCOPIC
Bilirubin Urine: NEGATIVE
Glucose, UA: NEGATIVE mg/dL
Ketones, ur: NEGATIVE mg/dL
Nitrite: NEGATIVE
Protein, ur: NEGATIVE mg/dL
Specific Gravity, Urine: 1.005 (ref 1.005–1.030)
pH: 5 (ref 5.0–8.0)

## 2023-08-13 LAB — CBC
HCT: 37.2 % — ABNORMAL LOW (ref 39.0–52.0)
Hemoglobin: 10.9 g/dL — ABNORMAL LOW (ref 13.0–17.0)
MCH: 29.2 pg (ref 26.0–34.0)
MCHC: 29.3 g/dL — ABNORMAL LOW (ref 30.0–36.0)
MCV: 99.7 fL (ref 80.0–100.0)
Platelets: 131 10*3/uL — ABNORMAL LOW (ref 150–400)
RBC: 3.73 MIL/uL — ABNORMAL LOW (ref 4.22–5.81)
RDW: 16.9 % — ABNORMAL HIGH (ref 11.5–15.5)
WBC: 6.6 10*3/uL (ref 4.0–10.5)
nRBC: 0 % (ref 0.0–0.2)

## 2023-08-13 LAB — I-STAT CG4 LACTIC ACID, ED: Lactic Acid, Venous: 1.6 mmol/L (ref 0.5–1.9)

## 2023-08-13 LAB — BRAIN NATRIURETIC PEPTIDE: B Natriuretic Peptide: 503.5 pg/mL — ABNORMAL HIGH (ref 0.0–100.0)

## 2023-08-13 MED ORDER — ACETAMINOPHEN 325 MG PO TABS
650.0000 mg | ORAL_TABLET | ORAL | Status: DC | PRN
  Administered 2023-08-19 – 2023-08-20 (×2): 650 mg via ORAL
  Filled 2023-08-13 (×2): qty 2

## 2023-08-13 MED ORDER — WARFARIN - PHARMACIST DOSING INPATIENT: Freq: Every day | Status: DC

## 2023-08-13 MED ORDER — SODIUM CHLORIDE 0.9% FLUSH: 3.0000 mL | INTRAVENOUS | Status: DC | PRN

## 2023-08-13 MED ORDER — CARVEDILOL 3.125 MG PO TABS
3.1250 mg | ORAL_TABLET | Freq: Two times a day (BID) | ORAL | Status: DC
  Administered 2023-08-14 – 2023-08-19 (×11): 3.125 mg via ORAL
  Filled 2023-08-13 (×11): qty 1

## 2023-08-13 MED ORDER — WARFARIN 1.25 MG HALF TABLET
1.2500 mg | ORAL_TABLET | Freq: Once | ORAL | Status: AC
  Administered 2023-08-13: 1.25 mg via ORAL
  Filled 2023-08-13 (×2): qty 1

## 2023-08-13 MED ORDER — ONDANSETRON HCL 4 MG/2ML IJ SOLN: 4.0000 mg | Freq: Four times a day (QID) | INTRAMUSCULAR | Status: DC | PRN

## 2023-08-13 MED ORDER — SODIUM CHLORIDE 0.9 % IV SOLN
2.0000 g | INTRAVENOUS | Status: DC
  Administered 2023-08-13: 2 g via INTRAVENOUS
  Filled 2023-08-13: qty 20

## 2023-08-13 MED ORDER — LISINOPRIL 2.5 MG PO TABS
2.5000 mg | ORAL_TABLET | Freq: Every day | ORAL | Status: DC
  Administered 2023-08-13: 2.5 mg via ORAL
  Filled 2023-08-13: qty 1

## 2023-08-13 MED ORDER — SIMVASTATIN 20 MG PO TABS
20.0000 mg | ORAL_TABLET | Freq: Every day | ORAL | Status: DC
  Administered 2023-08-13 – 2023-08-18 (×6): 20 mg via ORAL
  Filled 2023-08-13 (×6): qty 1

## 2023-08-13 MED ORDER — FUROSEMIDE 10 MG/ML IJ SOLN
40.0000 mg | Freq: Two times a day (BID) | INTRAMUSCULAR | Status: DC
  Administered 2023-08-14 (×2): 40 mg via INTRAVENOUS
  Filled 2023-08-13 (×2): qty 4

## 2023-08-13 MED ORDER — SODIUM CHLORIDE 0.9% FLUSH
3.0000 mL | Freq: Two times a day (BID) | INTRAVENOUS | Status: DC
  Administered 2023-08-13 – 2023-08-21 (×15): 3 mL via INTRAVENOUS

## 2023-08-13 MED ORDER — SODIUM CHLORIDE 0.9 % IV SOLN: 250.0000 mL | INTRAVENOUS | Status: AC | PRN

## 2023-08-13 MED ORDER — FINASTERIDE 5 MG PO TABS
5.0000 mg | ORAL_TABLET | Freq: Every day | ORAL | Status: DC
  Administered 2023-08-14 – 2023-08-21 (×7): 5 mg via ORAL
  Filled 2023-08-13 (×9): qty 1

## 2023-08-13 MED ORDER — ENOXAPARIN SODIUM 40 MG/0.4ML IJ SOSY: 40.0000 mg | PREFILLED_SYRINGE | INTRAMUSCULAR | Status: DC

## 2023-08-13 MED ORDER — FUROSEMIDE 10 MG/ML IJ SOLN
40.0000 mg | Freq: Once | INTRAMUSCULAR | Status: AC
  Administered 2023-08-13: 40 mg via INTRAVENOUS
  Filled 2023-08-13: qty 4

## 2023-08-13 NOTE — ED Notes (Addendum)
Patient transported to CT with primary RN. 

## 2023-08-13 NOTE — Progress Notes (Signed)
PHARMACY - ANTICOAGULATION CONSULT NOTE  Pharmacy Consult for Warfarin  Indication: atrial fibrillation  Allergies  Allergen Reactions   Tramadol Itching    Patient Measurements: Height: 5\' 4"  (162.6 cm) Weight: 85.3 kg (188 lb 0.8 oz) IBW/kg (Calculated) : 59.2  Vital Signs: Temp: 97.9 F (36.6 C) (12/01 2125) Temp Source: Oral (12/01 2125) BP: 167/99 (12/01 2152) Pulse Rate: 81 (12/01 2152)  Labs: Recent Labs    08/13/23 1644 08/13/23 1653  HGB 10.9* 12.6*  HCT 37.2* 37.0*  PLT 131*  --   LABPROT 25.2*  --   INR 2.3*  --   CREATININE 1.58* 1.60*    Estimated Creatinine Clearance: 30.2 mL/min (A) (by C-G formula based on SCr of 1.6 mg/dL (H)).   Medical History: Past Medical History:  Diagnosis Date   A-fib [facility])    Arthritis    CAD (coronary artery disease)    GXT, neg bruce protocol GXT   Chronic kidney disease    RENAL INSUFFICIENCY   Diverticulosis    ED (erectile dysfunction)    GERD (gastroesophageal reflux disease)    Glaucoma    Gout    no flare in years    Heart disorder    Hypertension    Obesity    Squamous cell carcinoma in situ     Assessment: 89 y/o M on warfarin PTA for afib, presents to the ED s/p fall, CT head negative, continuing warfarin, INR therapeutic at 2.3, last dose of warfarin 11/30 PM.   Goal of Therapy:  INR 2-3 Monitor platelets by anticoagulation protocol: Yes   Plan:  Warfarin 1.25 mg PO x 1 now Daily PT/INR Monitor for bleeding  Abran Duke, PharmD, BCPS Clinical Pharmacist Phone: [phone]

## 2023-08-13 NOTE — Progress Notes (Signed)
   08/13/23 1652  Spiritual Encounters  Type of Visit Declined chaplain visit  OnCall Visit Yes  Intervention Outcomes  Outcomes Declined chaplain visit   Chaplain responded to level 2 fall on thinners. Patient and family member declined a visit.   Arlyce Dice, Chaplain Resident [phone]

## 2023-08-13 NOTE — ED Notes (Signed)
Patients head wound was cleaned and bandaged.

## 2023-08-13 NOTE — H&P (Incomplete)
PCP:   Ronnald Nian, MD   Chief Complaint:  ***  HPI: This is a 89 y/o male w/ PMHx of CAD, chronic thrombocytopenia, HLD, HTN, permanent A-fib, chronic anticoagulation, sp PPM, history of gout.  She follows with [facility] cardiology.  At baseline patient weak, uses a walker for ambulation.  Today while getting out of his car he just fell.  There is no reports of loss of consciousness.  It was a witnessed fall.  He did hit his head.  There was no seizure.  Per daughter  Got out of car, and just fell. Hit head. On coumadin. No LOC.no sz Mechanical fall  V weak, Diarrhea - pretty freq ~5x daily ~ lasy rear.no abd pain  last colonocpy remote . No bd in stool No light/dizzy  No other infective type sx Apetite poor. Some cereal bkfst. Missed - chronic progressive  Lots of sleeping Avid reader , not much of this last few wks Period confusion  At baseline uses a walker, but getting weaker Up until  last few wks able to get up  No cp  LE swelling.  End of oct . Not r leg really swollen +> ER =us neg dct  8lbs wt gain in 3 weeks  Review of Systems:  The patient denies anorexia, fever, weight loss,, vision loss, decreased hearing, hoarseness, chest pain, syncope, dyspnea on exertion, peripheral edema, balance deficits, hemoptysis, abdominal pain, melena, hematochezia, severe indigestion/heartburn, hematuria, incontinence, genital sores, muscle weakness, suspicious skin lesions, transient blindness, difficulty walking, depression, unusual weight change, abnormal bleeding, enlarged lymph nodes, angioedema, and breast masses.  Past Medical History: Past Medical History:  Diagnosis Date  . A-fib (HCC)   . Arthritis   . CAD (coronary artery disease)    GXT, neg bruce protocol GXT  . Chronic kidney disease    RENAL INSUFFICIENCY  . Diverticulosis   . ED (erectile dysfunction)   . GERD (gastroesophageal reflux disease)   . Glaucoma   . Gout    no flare in years   . Heart disorder   .  Hypertension   . Obesity   . Squamous cell carcinoma in situ    Past Surgical History:  Procedure Laterality Date  . CATARACT EXTRACTION, BILATERAL  2018   with lens placement   . PACEMAKER INSERTION  01/09/03  . TONSILLECTOMY    . TOTAL KNEE ARTHROPLASTY Right 02/27/2018   Procedure: RIGHT TOTAL KNEE ARTHROPLASTY;  Surgeon: Durene Romans, MD;  Location: [facility];  Service: Orthopedics;  Laterality: Right;  70 mins  . TOTAL KNEE ARTHROPLASTY Left 09/06/2018   Procedure: LEFT TOTAL KNEE ARTHROPLASTY;  Surgeon: Durene Romans, MD;  Location: [facility];  Service: Orthopedics;  Laterality: Left;  70 mins    Medications: Prior to Admission medications   Medication Sig Start Date End Date Taking? Authorizing Provider  acetaminophen (TYLENOL) 500 MG tablet Take 500 mg by mouth every 6 (six) hours as needed (for pain).   Yes [provider]  allopurinol (ZYLOPRIM) 100 MG tablet TAKE 1 TABLET BY MOUTH DAILY 04/19/23  Yes Ronnald Nian, MD  carvedilol (COREG) 3.125 MG tablet Take 1 tablet (3.125 mg total) by mouth 2 (two) times daily with a meal. 12/27/22  Yes Marinus Maw, MD  Cholecalciferol (VITAMIN D3) 1000 units CAPS Take 2,000 Units by mouth daily.   Yes [provider]  finasteride (PROSCAR) 5 MG tablet TAKE 1 TABLET BY MOUTH DAILY 04/24/23  Yes Ronnald Nian, MD  furosemide (LASIX)  20 MG tablet Take 1 tablet (20 mg total) by mouth daily. 08/01/23  Yes Ronnald Nian, MD  latanoprost (XALATAN) 0.005 % ophthalmic solution Place 1 drop into both eyes at bedtime.  06/12/17  Yes [provider]  Multiple Vitamins-Minerals (PRESERVISION AREDS 2 PO) Take 1 capsule by mouth 2 (two) times daily.    Yes [provider]  simvastatin (ZOCOR) 20 MG tablet Take 1 tablet (20 mg total) by mouth at bedtime. 07/12/23  Yes Ronnald Nian, MD  tamsulosin (FLOMAX) 0.4 MG CAPS capsule Take 2 capsules (0.8 mg total) by mouth daily. 12/29/22  Yes Ronnald Nian, MD  warfarin  (COUMADIN) 2.5 MG tablet TAKE 1 TO 1 AND 1/2 TABLETS BY  MOUTH DAILY AS DIRECTED BY THE  COUMADIN CLINIC Patient taking differently: Take by mouth at bedtime. TAKE 1 TO 1 AND 1/2 TABLETS BY  MOUTH DAILY AS DIRECTED BY THE COUMADIN CLINIC Take 2.5mg  (1 tablet) every night except for Sundays and Wednesdays, take 1.25mg  (0.5 tablet) 10/31/22  Yes Marinus Maw, MD    Allergies:   Allergies  Allergen Reactions  . Tramadol Itching    Social History:  reports that he quit smoking about 43 years ago. His smoking use included cigarettes. He started smoking about 53 years ago. He has a 10 pack-year smoking history. He has never used smokeless tobacco. He reports current alcohol use. He reports that he does not use drugs.  Family History: Family History  Problem Relation Age of Onset  . Heart disease Mother   . Hypertension Mother   . Kidney disease Mother   . Stroke Mother   . Heart disease Father     Physical Exam: Vitals:   08/13/23 1800 08/13/23 1830 08/13/23 1900 08/13/23 2000  BP: (!) 163/90 (!) 189/117 (!) 173/90 (!) 180/102  Pulse: 100 77 94 78  Resp: 16 (!) 22 18 19   Temp:      SpO2: 92% 96% 94% 96%  Weight:      Height:        General:  Alert and oriented times three, well developed and nourished, no acute distress Eyes: PERRLA, pink conjunctiva, no scleral icterus ENT: Moist oral mucosa, neck supple, no thyromegaly Lungs: clear to ascultation, no wheeze, no crackles, no use of accessory muscles Cardiovascular: regular rate and rhythm, no regurgitation, no gallops, no murmurs. No carotid bruits, no JVD Abdomen: soft, positive BS, non-tender, non-distended, no organomegaly, not an acute abdomen GU: not examined Neuro: CN II - XII grossly intact, sensation intact Musculoskeletal: strength 5/5 all extremities, no clubbing, cyanosis or edema Skin: no rash, no subcutaneous crepitation, no decubitus Psych: appropriate patient   Bruiding right forehead ++JVD and assesoty  Veinsspider nevi on chest wall ++>3+ B/L Le pitting up to abdomen Interigo left groin, periumbilical regionm Irrefg irreg HB, + murmus holosystolic LLE blood blister/ warthmth along entire LE  Labs on Admission:  Recent Labs    08/13/23 1644 08/13/23 1653  NA 142 142  K 4.3 4.2  CL 110 108  CO2 24  --   GLUCOSE 118* 114*  BUN 26* 27*  CREATININE 1.58* 1.60*  CALCIUM 9.2  --    Recent Labs    08/13/23 1644  AST 27  ALT 12  ALKPHOS 86  BILITOT 1.1  PROT 6.0*  ALBUMIN 3.2*    Recent Labs    08/13/23 1644 08/13/23 1653  WBC 6.6  --   HGB 10.9* 12.6*  HCT 37.2* 37.0*  MCV 99.7  --   PLT 131*  --     Radiological Exams on Admission: CT HEAD WO CONTRAST  Result Date: 08/13/2023 CLINICAL DATA:  Head trauma, moderate-severe; Polytrauma, blunt EXAM: CT HEAD WITHOUT CONTRAST CT CERVICAL SPINE WITHOUT CONTRAST TECHNIQUE: Multidetector CT imaging of the head and cervical spine was performed following the standard protocol without intravenous contrast. Multiplanar CT image reconstructions of the cervical spine were also generated. RADIATION DOSE REDUCTION: This exam was performed according to the departmental dose-optimization program which includes automated exposure control, adjustment of the mA and/or kV according to patient size and/or use of iterative reconstruction technique. COMPARISON:  Chest x-ray 08/13/2023 FINDINGS: CT HEAD FINDINGS Brain: Cerebral ventricle sizes are concordant with the degree of cerebral volume loss. Patchy and confluent areas of decreased attenuation are noted throughout the deep and periventricular white matter of the cerebral hemispheres bilaterally, compatible with chronic microvascular ischemic disease. No evidence of large-territorial acute infarction. No parenchymal hemorrhage. No mass lesion. No extra-axial collection. No mass effect or midline shift. No hydrocephalus. Basilar cisterns are patent. Vascular: No hyperdense vessel. Atherosclerotic  calcifications are present within the cavernous internal carotid and vertebral arteries. Skull: No acute fracture or focal lesion. Sinuses/Orbits: Paranasal sinuses and mastoid air cells are clear. Bilateral lens replacement. Otherwise the orbits are unremarkable. Other: 7 mm right scalp hematoma. CT CERVICAL SPINE FINDINGS Alignment: Normal. Skull base and vertebrae: Multilevel moderate degenerative change of the spine. Associated moderate to severe bilateral C3-C4 C5-C6 osseous neural foraminal stenosis. No severe osseous central canal stenosis. No acute fracture. No aggressive appearing focal osseous lesion or focal pathologic process. Soft tissues and spinal canal: No prevertebral fluid or swelling. No visible canal hematoma. Upper chest: Small volume right pleural effusion. Other: Atherosclerotic plaque of the carotid arteries within the neck. IMPRESSION: 1. No acute intracranial abnormality. 2. No acute displaced fracture or traumatic listhesis of the cervical spine. 3. Small volume right pleural effusion. Electronically Signed   By: Tish Frederickson M.D.   On: 08/13/2023 17:28   CT CERVICAL SPINE WO CONTRAST  Result Date: 08/13/2023 CLINICAL DATA:  Head trauma, moderate-severe; Polytrauma, blunt EXAM: CT HEAD WITHOUT CONTRAST CT CERVICAL SPINE WITHOUT CONTRAST TECHNIQUE: Multidetector CT imaging of the head and cervical spine was performed following the standard protocol without intravenous contrast. Multiplanar CT image reconstructions of the cervical spine were also generated. RADIATION DOSE REDUCTION: This exam was performed according to the departmental dose-optimization program which includes automated exposure control, adjustment of the mA and/or kV according to patient size and/or use of iterative reconstruction technique. COMPARISON:  Chest x-ray 08/13/2023 FINDINGS: CT HEAD FINDINGS Brain: Cerebral ventricle sizes are concordant with the degree of cerebral volume loss. Patchy and confluent areas  of decreased attenuation are noted throughout the deep and periventricular white matter of the cerebral hemispheres bilaterally, compatible with chronic microvascular ischemic disease. No evidence of large-territorial acute infarction. No parenchymal hemorrhage. No mass lesion. No extra-axial collection. No mass effect or midline shift. No hydrocephalus. Basilar cisterns are patent. Vascular: No hyperdense vessel. Atherosclerotic calcifications are present within the cavernous internal carotid and vertebral arteries. Skull: No acute fracture or focal lesion. Sinuses/Orbits: Paranasal sinuses and mastoid air cells are clear. Bilateral lens replacement. Otherwise the orbits are unremarkable. Other: 7 mm right scalp hematoma. CT CERVICAL SPINE FINDINGS Alignment: Normal. Skull base and vertebrae: Multilevel moderate degenerative change of the spine. Associated moderate to severe bilateral C3-C4 C5-C6 osseous neural foraminal stenosis. No severe osseous central canal stenosis. No acute fracture.  No aggressive appearing focal osseous lesion or focal pathologic process. Soft tissues and spinal canal: No prevertebral fluid or swelling. No visible canal hematoma. Upper chest: Small volume right pleural effusion. Other: Atherosclerotic plaque of the carotid arteries within the neck. IMPRESSION: 1. No acute intracranial abnormality. 2. No acute displaced fracture or traumatic listhesis of the cervical spine. 3. Small volume right pleural effusion. Electronically Signed   By: Tish Frederickson M.D.   On: 08/13/2023 17:28   DG Pelvis Portable  Result Date: 08/13/2023 CLINICAL DATA:  Pain after trauma EXAM: PORTABLE PELVIS 1 VIEWS COMPARISON:  None Available. FINDINGS: Hyperostosis. Slight joint space loss of the right hip compared to left. Mild degenerative changes of the sacroiliac joints. Hypertrophic changes of the pubic symphysis. No fracture or dislocation. Prominent degenerative changes along the lumbar spine. Vascular  calcifications. Osteopenia. With this level of osteopenia subtle nondisplaced injury is difficult to completely exclude and if needed additional cross-sectional imaging as clinically directed such as CT for further sensitivity. IMPRESSION: Mild degenerative changes.  Hyperostosis.  Osteopenia Electronically Signed   By: Karen Kays M.D.   On: 08/13/2023 17:25   DG Chest Port 1 View  Result Date: 08/13/2023 CLINICAL DATA:  Pain after trauma.  Larey Seat out of a car. EXAM: PORTABLE CHEST 1 VIEW COMPARISON:  X-ray 08/01/2023.  Older exams as well FINDINGS: Enlarged cardiopericardial silhouette with calcified aorta. Left upper chest pacemaker. Small right effusion. Linear opacity at the bases. No pneumothorax. Developing interstitial changes. If there is further concern of the sequela of trauma, contrast CT could be considered as clinically appropriate for further sensitivity of injury IMPRESSION: Enlarged heart. Pacemaker. New bilateral interstitial changes. Recommend follow up Small right pleural effusion.  Basilar opacities. Electronically Signed   By: Karen Kays M.D.   On: 08/13/2023 17:24    Assessment/Plan Present on Admission: . acute congestive heart failure - Chf order set -2d echo - le bauue card  RLE cellulitis -IV abx  Phycical decinditioning -PT/OT consult -  . CAD (coronary artery disease) -  . Thrombocytopenia - chronic -  . Hyperlipidemia LDL goal <70 -  . Primary hypertension -  . Permanent atrial fibrillation (HCC) . Chronic anticoagulation -  . Gout -  Kelissa Merlin 08/13/2023, 9:14 PM
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
## 2023-08-13 NOTE — H&P (Incomplete)
PCP:   Ronnald Nian, MD   Chief Complaint:  Weakness, shortness of breath  HPI: This is a 89 y/o male w/ PMHx of CAD, chronic thrombocytopenia, HLD, HTN, permanent A-fib, chronic anticoagulation, sp PPM, history of gout.  She follows with [facility] cardiology.  At baseline patient weak, uses a walker for ambulation.  Today while getting out of his car he just fell.  There is no reports of loss of consciousness.  It was a witnessed fall.  He did hit his head.  There was no seizure.  Per daughter patient has been getting progressively weaker over several weeks.  He has chronic diarrhea ~5x daily for approximately a year. (His daughter was not aware of this timeline).  He denies abdominal pain, blood in stool.  Remote history of colonoscopy.  He denies being lightheaded or dizzy.  There is report of chronic and worsening shortness of breath and increasing lower extremity swelling.  Patient's appetite is poor.  Patient losing interest in his usual entertainment and is mostly sleeping.  At the end of October he was able to urgent care for lower extremity swelling on the right.  Ultrasound done and negative.  He has gained at least 8 pounds in last 3 weeks.  There is no reports of chest pains.  His daughter brought him to the ER.  In the ER patient is hemodynamically stable.  CXR shows bibasilar opacities.  Patient's fluid overloaded on examination.  EKG atrial fibrillation, rate controlled, normal QT.    History provided by patient's daughter present at bedside.  Patient appears to be a pleasant but more deliberate and limited historian  Review of Systems:  Per HPI  Past Medical History: Past Medical History:  Diagnosis Date   A-fib (HCC)    Arthritis    CAD (coronary artery disease)    GXT, neg bruce protocol GXT   Chronic kidney disease    RENAL INSUFFICIENCY   Diverticulosis    ED (erectile dysfunction)    GERD (gastroesophageal reflux disease)    Glaucoma    Gout    no flare in years     Heart disorder    Hypertension    Obesity    Squamous cell carcinoma in situ    Past Surgical History:  Procedure Laterality Date   CATARACT EXTRACTION, BILATERAL  2018   with lens placement    PACEMAKER INSERTION  01/09/03   TONSILLECTOMY     TOTAL KNEE ARTHROPLASTY Right 02/27/2018   Procedure: RIGHT TOTAL KNEE ARTHROPLASTY;  Surgeon: Durene Romans, MD;  Location: [facility];  Service: Orthopedics;  Laterality: Right;  70 mins   TOTAL KNEE ARTHROPLASTY Left 09/06/2018   Procedure: LEFT TOTAL KNEE ARTHROPLASTY;  Surgeon: Durene Romans, MD;  Location: [facility];  Service: Orthopedics;  Laterality: Left;  70 mins    Medications: Prior to Admission medications   Medication Sig Start Date End Date Taking? Authorizing Provider  acetaminophen (TYLENOL) 500 MG tablet Take 500 mg by mouth every 6 (six) hours as needed (for pain).   Yes [provider]  allopurinol (ZYLOPRIM) 100 MG tablet TAKE 1 TABLET BY MOUTH DAILY 04/19/23  Yes Ronnald Nian, MD  carvedilol (COREG) 3.125 MG tablet Take 1 tablet (3.125 mg total) by mouth 2 (two) times daily with a meal. 12/27/22  Yes Marinus Maw, MD  Cholecalciferol (VITAMIN D3) 1000 units CAPS Take 2,000 Units by mouth daily.   Yes [provider]  finasteride (PROSCAR) 5 MG tablet TAKE 1  TABLET BY MOUTH DAILY 04/24/23  Yes Ronnald Nian, MD  furosemide (LASIX) 20 MG tablet Take 1 tablet (20 mg total) by mouth daily. 08/01/23  Yes Ronnald Nian, MD  latanoprost (XALATAN) 0.005 % ophthalmic solution Place 1 drop into both eyes at bedtime.  06/12/17  Yes [provider]  Multiple Vitamins-Minerals (PRESERVISION AREDS 2 PO) Take 1 capsule by mouth 2 (two) times daily.    Yes [provider]  simvastatin (ZOCOR) 20 MG tablet Take 1 tablet (20 mg total) by mouth at bedtime. 07/12/23  Yes Ronnald Nian, MD  tamsulosin (FLOMAX) 0.4 MG CAPS capsule Take 2 capsules (0.8 mg total) by mouth daily. 12/29/22  Yes Ronnald Nian, MD   warfarin (COUMADIN) 2.5 MG tablet TAKE 1 TO 1 AND 1/2 TABLETS BY  MOUTH DAILY AS DIRECTED BY THE  COUMADIN CLINIC Patient taking differently: Take by mouth at bedtime. TAKE 1 TO 1 AND 1/2 TABLETS BY  MOUTH DAILY AS DIRECTED BY THE COUMADIN CLINIC Take 2.5mg  (1 tablet) every night except for Sundays and Wednesdays, take 1.25mg (0.5 tablet) 10/31/22  Yes Taylor, Gregg W, MD    Allergies:   Allergies  Allergen Reactions   Tramadol Itching    Social History:  reports that he quit smoking about 43 years ago. His smoking use included cigarettes. He started smoking about 53 years ago. He has a 10 pack-year smoking history. He has never used smokeless tobacco. He reports current alcohol use. He reports that he does not use drugs.  Family History: Family History  Problem Relation Age of Onset   Heart disease Mother    Hypertension Mother    Kidney disease Mother    Stroke Mother    Heart disease Father     Physical Exam: Vitals:   08/13/23 1800 08/13/23 1830 08/13/23 1900 08/13/23 2000  BP: (!) 163/90 (!) 189/117 (!) 173/90 (!) 180/102  Pulse: 100 77 94 78  Resp: 16 (!) 22 18 19  Temp:      SpO2: 92% 96% 94% 96%  Weight:      Height:        General: A&O x 3, elderly, weak gentleman, right parietal scalp laceration Eyes: Pink conjunctiva, no scleral icterus ENT: Moist oral mucosa,  no thyromegaly Lungs: CTA B/L, no wheeze, no crackles, no use of accessory muscles Cardiovascular: Irregularly irregular, ++ JVD, positive holosystolic murmur Abdomen: soft, positive BS, NTND, not an acute abdomen GU: not examined Neuro: CN II - XII grossly intact Musculoskeletal:>3+  B/L  LE pitting edema >>3+, pitting up to abdomen strength 5/5 all extremities Skin: Small, circumscribed area with blood blister lateral LE with warmth extending to the knee..  Intertrigo  left groin, periumbilical area.  Spider nevi anterior chest wall Psych: appropriate patient  Labs on Admission:  Recent Labs     12 /01/24 1644 08/13/23 1653  NA 142 142  K 4.3 4.2  CL 110 108  CO2 24  --   GLUCOSE 118* 114*  BUN 26* 27*  CREATININE 1.58* 1.60*  CALCIUM 9.2  --    Recent Labs    08/13/23 1644  AST 27  ALT 12  ALKPHOS 86  BILITOT 1.1  PROT 6.0*  ALBUMIN 3.2*    Recent Labs    08/13/23 1644 08/13/23 1653  WBC 6.6  --   HGB 10.9* 12.6*  HCT 37.2* 37.0*  MCV 99.7  --   PLT 131*  --     Radiological Exams on  Admission: CT HEAD WO CONTRAST  Result Date: 08/13/2023 CLINICAL DATA:  Head trauma, moderate-severe; Polytrauma, blunt EXAM: CT HEAD WITHOUT CONTRAST CT CERVICAL SPINE WITHOUT CONTRAST TECHNIQUE: Multidetector CT imaging of the head and cervical spine was performed following the standard protocol without intravenous contrast. Multiplanar CT image reconstructions of the cervical spine were also generated. RADIATION DOSE REDUCTION: This exam was performed according to the departmental dose-optimization program which includes automated exposure control, adjustment of the mA and/or kV according to patient size and/or use of iterative reconstruction technique. COMPARISON:  Chest x-ray 08/13/2023 FINDINGS: CT HEAD FINDINGS Brain: Cerebral ventricle sizes are concordant with the degree of cerebral volume loss. Patchy and confluent areas of decreased attenuation are noted throughout the deep and periventricular white matter of the cerebral hemispheres bilaterally, compatible with chronic microvascular ischemic disease. No evidence of large-territorial acute infarction. No parenchymal hemorrhage. No mass lesion. No extra-axial collection. No mass effect or midline shift. No hydrocephalus. Basilar cisterns are patent. Vascular: No hyperdense vessel. Atherosclerotic calcifications are present within the cavernous internal carotid and vertebral arteries. Skull: No acute fracture or focal lesion. Sinuses/Orbits: Paranasal sinuses and mastoid air cells are clear. Bilateral lens replacement. Otherwise the  orbits are unremarkable. Other: 7 mm right scalp hematoma. CT CERVICAL SPINE FINDINGS Alignment: Normal. Skull base and vertebrae: Multilevel moderate degenerative change of the spine. Associated moderate to severe bilateral C3-C4 C5-C6 osseous neural foraminal stenosis. No severe osseous central canal stenosis. No acute fracture. No aggressive appearing focal osseous lesion or focal pathologic process. Soft tissues and spinal canal: No prevertebral fluid or swelling. No visible canal hematoma. Upper chest: Small volume right pleural effusion. Other: Atherosclerotic plaque of the carotid arteries within the neck. IMPRESSION: 1. No acute intracranial abnormality. 2. No acute displaced fracture or traumatic listhesis of the cervical spine. 3. Small volume right pleural effusion. Electronically Signed   By: Tish Frederickson M.D.   On: 08/13/2023 17:28   CT CERVICAL SPINE WO CONTRAST  Result Date: 08/13/2023 CLINICAL DATA:  Head trauma, moderate-severe; Polytrauma, blunt EXAM: CT HEAD WITHOUT CONTRAST CT CERVICAL SPINE WITHOUT CONTRAST TECHNIQUE: Multidetector CT imaging of the head and cervical spine was performed following the standard protocol without intravenous contrast. Multiplanar CT image reconstructions of the cervical spine were also generated. RADIATION DOSE REDUCTION: This exam was performed according to the departmental dose-optimization program which includes automated exposure control, adjustment of the mA and/or kV according to patient size and/or use of iterative reconstruction technique. COMPARISON:  Chest x-ray 08/13/2023 FINDINGS: CT HEAD FINDINGS Brain: Cerebral ventricle sizes are concordant with the degree of cerebral volume loss. Patchy and confluent areas of decreased attenuation are noted throughout the deep and periventricular white matter of the cerebral hemispheres bilaterally, compatible with chronic microvascular ischemic disease. No evidence of large-territorial acute infarction. No  parenchymal hemorrhage. No mass lesion. No extra-axial collection. No mass effect or midline shift. No hydrocephalus. Basilar cisterns are patent. Vascular: No hyperdense vessel. Atherosclerotic calcifications are present within the cavernous internal carotid and vertebral arteries. Skull: No acute fracture or focal lesion. Sinuses/Orbits: Paranasal sinuses and mastoid air cells are clear. Bilateral lens replacement. Otherwise the orbits are unremarkable. Other: 7 mm right scalp hematoma. CT CERVICAL SPINE FINDINGS Alignment: Normal. Skull base and vertebrae: Multilevel moderate degenerative change of the spine. Associated moderate to severe bilateral C3-C4 C5-C6 osseous neural foraminal stenosis. No severe osseous central canal stenosis. No acute fracture. No aggressive appearing focal osseous lesion or focal pathologic process. Soft tissues and spinal canal: No prevertebral  fluid or swelling. No visible canal hematoma. Upper chest: Small volume right pleural effusion. Other: Atherosclerotic plaque of the carotid arteries within the neck. IMPRESSION: 1. No acute intracranial abnormality. 2. No acute displaced fracture or traumatic listhesis of the cervical spine. 3. Small volume right pleural effusion. Electronically Signed   By: Tish Frederickson M.D.   On: 08/13/2023 17:28   DG Pelvis Portable  Result Date: 08/13/2023 CLINICAL DATA:  Pain after trauma EXAM: PORTABLE PELVIS 1 VIEWS COMPARISON:  None Available. FINDINGS: Hyperostosis. Slight joint space loss of the right hip compared to left. Mild degenerative changes of the sacroiliac joints. Hypertrophic changes of the pubic symphysis. No fracture or dislocation. Prominent degenerative changes along the lumbar spine. Vascular calcifications. Osteopenia. With this level of osteopenia subtle nondisplaced injury is difficult to completely exclude and if needed additional cross-sectional imaging as clinically directed such as CT for further sensitivity. IMPRESSION:  Mild degenerative changes.  Hyperostosis.  Osteopenia Electronically Signed   By: Karen Kays M.D.   On: 08/13/2023 17:25   DG Chest Port 1 View  Result Date: 08/13/2023 CLINICAL DATA:  Pain after trauma.  Larey Seat out of a car. EXAM: PORTABLE CHEST 1 VIEW COMPARISON:  X-ray 08/01/2023.  Older exams as well FINDINGS: Enlarged cardiopericardial silhouette with calcified aorta. Left upper chest pacemaker. Small right effusion. Linear opacity at the bases. No pneumothorax. Developing interstitial changes. If there is further concern of the sequela of trauma, contrast CT could be considered as clinically appropriate for further sensitivity of injury IMPRESSION: Enlarged heart. Pacemaker. New bilateral interstitial changes. Recommend follow up Small right pleural effusion.  Basilar opacities. Electronically Signed   By: Karen Kays M.D.   On: 08/13/2023 17:24    Assessment/Plan Present on Admission:  Acute congestive heart failure -Appears chronic but patient without that diagnosis -CHF order set initiated. -IV Lasix, strict I's and O's, daily weights -2D echo in a.m. -Cardiology consult placed -Nebulizers as needed, stable room air   Right lower extremity cellulitis -IV Rocephin started -MRSA screen   Intertrigo in moist folds/groin -Nystatin cream   Phycical deconditioning //  Fall at home, initial encounter Many contributing etiology including age-related deconditioning, dehydration from diarrhea, weakness from poor appetite malnutrition, cellulitis right lower extremity -PT/OT consult   CAD (coronary artery disease) //  Primary hypertension -Coreg, simvastatin, Coreg resumed   Thrombocytopenia - chronic -Baseline stable   Hyperlipidemia LDL goal <70 -Simvastatin resumed   Permanent atrial fibrillation (HCC)  Chronic anticoagulation -Coumadin resumed, pharmacy to dose   Gout -Resume allopurinol  BPH -Flomax resumed  Nielle Duford 08/13/2023, 9:14 PM

## 2023-08-13 NOTE — ED Notes (Signed)
Pt had of urine in canister, 10ml taken out for urine analysis.

## 2023-08-13 NOTE — ED Provider Notes (Signed)
[name] EMERGENCY DEPARTMENT AT [facility] Provider Note   CSN: [id number] Arrival date & time: 08/13/23  1607     History  Chief Complaint  Patient presents with   Fall    On thinner    [name]. is a 89 y.o. male with PMH as listed below who presents with fall on thinners. Patient is BIB his daughter who provides additional history.  [place] when he tried to get out of the car and fell forward onto the top of his head and his right side.  Did not lose consciousness, no nausea vomiting.  Patient denies any pain anywhere except for his lower legs.  He has been having trouble with fluids recently and was recently placed on Lasix but just for 5 days.  Had a chest x-ray 1 week ago and patient's daughter states that her daughter who is a nurse told her it looked like there was fluid on his lungs.  Patient is noted to be mildly tachypneic and states that he does at times feel short of breath.  Daughter notes that he is weeping from his bilateral lower legs as well.  He denies any chest pain. Patient does take Coumadin for A-fib.   Past Medical History:  Diagnosis Date   A-fib (HCC)    Arthritis    CAD (coronary artery disease)    GXT, neg bruce protocol GXT   Chronic kidney disease    RENAL INSUFFICIENCY   Diverticulosis    ED (erectile dysfunction)    GERD (gastroesophageal reflux disease)    Glaucoma    Gout    no flare in years    Heart disorder    Hypertension    Obesity    Squamous cell carcinoma in situ        Home Medications Prior to Admission medications   Medication Sig Start Date End Date Taking? Authorizing Provider  acetaminophen (TYLENOL) 500 MG tablet Take 500 mg by mouth every 6 (six) hours as needed (for pain).   Yes [provider]  allopurinol (ZYLOPRIM) 100 MG tablet TAKE 1 TABLET BY MOUTH DAILY 04/19/23  Yes Ronnald Nian, MD  carvedilol (COREG) 3.125 MG tablet Take 1 tablet (3.125 mg total)  by mouth 2 (two) times daily with a meal. 12/27/22  Yes Marinus Maw, MD  Cholecalciferol (VITAMIN D3) 1000 units CAPS Take 2,000 Units by mouth daily.   Yes [provider]  finasteride (PROSCAR) 5 MG tablet TAKE 1 TABLET BY MOUTH DAILY 04/24/23  Yes Ronnald Nian, MD  furosemide (LASIX) 20 MG tablet Take 1 tablet (20 mg total) by mouth daily. 08/01/23  Yes Ronnald Nian, MD  latanoprost (XALATAN) 0.005 % ophthalmic solution Place 1 drop into both eyes at bedtime.  06/12/17  Yes [provider]  Multiple Vitamins-Minerals (PRESERVISION AREDS 2 PO) Take 1 capsule by mouth 2 (two) times daily.    Yes [provider]  simvastatin (ZOCOR) 20 MG tablet Take 1 tablet (20 mg total) by mouth at bedtime. 07/12/23  Yes Ronnald Nian, MD  tamsulosin (FLOMAX) 0.4 MG CAPS capsule Take 2 capsules (0.8 mg total) by mouth daily. 12/29/22  Yes Ronnald Nian, MD  warfarin (COUMADIN) 2.5 MG tablet TAKE 1 TO 1 AND 1/2 TABLETS BY  MOUTH DAILY AS DIRECTED BY THE  COUMADIN CLINIC Patient taking differently: Take by mouth at bedtime. TAKE 1 TO 1 AND 1/2 TABLETS BY  MOUTH  DAILY AS DIRECTED BY THE COUMADIN CLINIC Take 2.5mg  (1 tablet) every night except for Sundays and Wednesdays, take 1.25mg  (0.5 tablet) 10/31/22  Yes Marinus Maw, MD      Allergies    Tramadol    Review of Systems   Review of Systems A 10 point review of systems was performed and is negative unless otherwise reported in HPI.  Physical Exam Updated Vital Signs BP (!) 180/102   Pulse 78   Temp 97.6 F (36.4 C)   Resp 19   Ht 5\' 4"  (1.626 m)   Wt 85.3 kg   SpO2 96%   BMI 32.28 kg/m  Physical Exam  PRIMARY SURVEY  Airway Airway intact  Breathing Bilateral breath sounds  Circulation Carotid/femoral pulses 2+ intact bilaterally  GCS E =  4 V =  5 M =  6 Total = 15  Environment All clothes removed      SECONDARY SURVEY  Gen: -NAD  HEENT: -Head: NCAT. Scalp shows mild abrasion to R parietal scalp,  hemostatic. Skull is clear of deformities or depressions -Forehead: Normal -Midface: Mild abrasion hemostatic to R zygoma -Eyes: No visible injury to eyelids or eye, PERRL, EOMI -Nose: No gross deformities -Mouth: No injuries to lips, tongue or teeth. No trismus or malposition -Ears: No auricular hematoma -Neck: Trachea is midline, no distended neck veins  Chest: -No tenderness, deformities, bruising or crepitus to clavicles or chest -Normal chest expansion -Normal heart sounds, S1/S2 normal, no m/r/g -Mild tachypnea with mildly increased WOB -No wheezes, rales, rhonchi  Abdomen: -No tenderness, bruising or penetrating injury  Pelvis: -Pelvis is stable and non-tender  Extremities: Right Upper Extremity: -Mild abrasion to R posterior shoulder -No point tenderness, deformity or other signs of injury -Radial pulse intact RUE, cap refill good -Normal sensation -Normal ROM, good strength Left Upper Extremity: -No point tenderness, deformity or other signs of injury -Radial pulse intact LUE, cap refill good -Normal sensation -Normal ROM, good strength Right Lower Extremity: -3+ pitting edema in leg up to hip, serious weeping present in lower leg -Mild 1cm abrasion to anterior knee -No point tenderness, deformity or other signs of injury -DP intact RLE -Normal sensation -Normal ROM, good strength Left Lower Extremity: -3+ pitting edema in leg up to hip -Mild abrasion to dorsal 2nd toe -No point tenderness, deformity or other signs of injury -DP intact LLE -Normal sensation -Normal ROM, good strength  Back/Spine: -No midline C T or L spine tenderness or step-offs  Other: N/A     ED Results / Procedures / Treatments   Labs (all labs ordered are listed, but only abnormal results are displayed) Labs Reviewed  COMPREHENSIVE METABOLIC PANEL - Abnormal; Notable for the following components:      Result Value   Glucose, Bld 118 (*)    BUN 26 (*)    Creatinine, Ser 1.58 (*)     Total Protein 6.0 (*)    Albumin 3.2 (*)    GFR, Estimated 41 (*)    All other components within normal limits  CBC - Abnormal; Notable for the following components:   RBC 3.73 (*)    Hemoglobin 10.9 (*)    HCT 37.2 (*)    MCHC 29.3 (*)    RDW 16.9 (*)    Platelets 131 (*)    All other components within normal limits  PROTIME-INR - Abnormal; Notable for the following components:   Prothrombin Time 25.2 (*)    INR 2.3 (*)    All  other components within normal limits  BRAIN NATRIURETIC PEPTIDE - Abnormal; Notable for the following components:   B Natriuretic Peptide 503.5 (*)    All other components within normal limits  I-STAT CHEM 8, ED - Abnormal; Notable for the following components:   BUN 27 (*)    Creatinine, Ser 1.60 (*)    Glucose, Bld 114 (*)    Calcium, Ion 1.10 (*)    Hemoglobin 12.6 (*)    HCT 37.0 (*)    All other components within normal limits  URINALYSIS, ROUTINE W REFLEX MICROSCOPIC  I-STAT CG4 LACTIC ACID, ED    EKG None  Radiology CT HEAD WO CONTRAST  Result Date: 08/13/2023 CLINICAL DATA:  Head trauma, moderate-severe; Polytrauma, blunt EXAM: CT HEAD WITHOUT CONTRAST CT CERVICAL SPINE WITHOUT CONTRAST TECHNIQUE: Multidetector CT imaging of the head and cervical spine was performed following the standard protocol without intravenous contrast. Multiplanar CT image reconstructions of the cervical spine were also generated. RADIATION DOSE REDUCTION: This exam was performed according to the departmental dose-optimization program which includes automated exposure control, adjustment of the mA and/or kV according to patient size and/or use of iterative reconstruction technique. COMPARISON:  Chest x-ray 08/13/2023 FINDINGS: CT HEAD FINDINGS Brain: Cerebral ventricle sizes are concordant with the degree of cerebral volume loss. Patchy and confluent areas of decreased attenuation are noted throughout the deep and periventricular white matter of the cerebral hemispheres  bilaterally, compatible with chronic microvascular ischemic disease. No evidence of large-territorial acute infarction. No parenchymal hemorrhage. No mass lesion. No extra-axial collection. No mass effect or midline shift. No hydrocephalus. Basilar cisterns are patent. Vascular: No hyperdense vessel. Atherosclerotic calcifications are present within the cavernous internal carotid and vertebral arteries. Skull: No acute fracture or focal lesion. Sinuses/Orbits: Paranasal sinuses and mastoid air cells are clear. Bilateral lens replacement. Otherwise the orbits are unremarkable. Other: 7 mm right scalp hematoma. CT CERVICAL SPINE FINDINGS Alignment: Normal. Skull base and vertebrae: Multilevel moderate degenerative change of the spine. Associated moderate to severe bilateral C3-C4 C5-C6 osseous neural foraminal stenosis. No severe osseous central canal stenosis. No acute fracture. No aggressive appearing focal osseous lesion or focal pathologic process. Soft tissues and spinal canal: No prevertebral fluid or swelling. No visible canal hematoma. Upper chest: Small volume right pleural effusion. Other: Atherosclerotic plaque of the carotid arteries within the neck. IMPRESSION: 1. No acute intracranial abnormality. 2. No acute displaced fracture or traumatic listhesis of the cervical spine. 3. Small volume right pleural effusion. Electronically Signed   By: Tish Frederickson M.D.   On: 08/13/2023 17:28   CT CERVICAL SPINE WO CONTRAST  Result Date: 08/13/2023 CLINICAL DATA:  Head trauma, moderate-severe; Polytrauma, blunt EXAM: CT HEAD WITHOUT CONTRAST CT CERVICAL SPINE WITHOUT CONTRAST TECHNIQUE: Multidetector CT imaging of the head and cervical spine was performed following the standard protocol without intravenous contrast. Multiplanar CT image reconstructions of the cervical spine were also generated. RADIATION DOSE REDUCTION: This exam was performed according to the departmental dose-optimization program which  includes automated exposure control, adjustment of the mA and/or kV according to patient size and/or use of iterative reconstruction technique. COMPARISON:  Chest x-ray 08/13/2023 FINDINGS: CT HEAD FINDINGS Brain: Cerebral ventricle sizes are concordant with the degree of cerebral volume loss. Patchy and confluent areas of decreased attenuation are noted throughout the deep and periventricular white matter of the cerebral hemispheres bilaterally, compatible with chronic microvascular ischemic disease. No evidence of large-territorial acute infarction. No parenchymal hemorrhage. No mass lesion. No extra-axial collection. No mass effect  or midline shift. No hydrocephalus. Basilar cisterns are patent. Vascular: No hyperdense vessel. Atherosclerotic calcifications are present within the cavernous internal carotid and vertebral arteries. Skull: No acute fracture or focal lesion. Sinuses/Orbits: Paranasal sinuses and mastoid air cells are clear. Bilateral lens replacement. Otherwise the orbits are unremarkable. Other: 7 mm right scalp hematoma. CT CERVICAL SPINE FINDINGS Alignment: Normal. Skull base and vertebrae: Multilevel moderate degenerative change of the spine. Associated moderate to severe bilateral C3-C4 C5-C6 osseous neural foraminal stenosis. No severe osseous central canal stenosis. No acute fracture. No aggressive appearing focal osseous lesion or focal pathologic process. Soft tissues and spinal canal: No prevertebral fluid or swelling. No visible canal hematoma. Upper chest: Small volume right pleural effusion. Other: Atherosclerotic plaque of the carotid arteries within the neck. IMPRESSION: 1. No acute intracranial abnormality. 2. No acute displaced fracture or traumatic listhesis of the cervical spine. 3. Small volume right pleural effusion. Electronically Signed   By: Tish Frederickson M.D.   On: 08/13/2023 17:28   DG Pelvis Portable  Result Date: 08/13/2023 CLINICAL DATA:  Pain after trauma EXAM:  PORTABLE PELVIS 1 VIEWS COMPARISON:  None Available. FINDINGS: Hyperostosis. Slight joint space loss of the right hip compared to left. Mild degenerative changes of the sacroiliac joints. Hypertrophic changes of the pubic symphysis. No fracture or dislocation. Prominent degenerative changes along the lumbar spine. Vascular calcifications. Osteopenia. With this level of osteopenia subtle nondisplaced injury is difficult to completely exclude and if needed additional cross-sectional imaging as clinically directed such as CT for further sensitivity. IMPRESSION: Mild degenerative changes.  Hyperostosis.  Osteopenia Electronically Signed   By: Karen Kays M.D.   On: 08/13/2023 17:25   DG Chest Port 1 View  Result Date: 08/13/2023 CLINICAL DATA:  Pain after trauma.  Larey Seat out of a car. EXAM: PORTABLE CHEST 1 VIEW COMPARISON:  X-ray 08/01/2023.  Older exams as well FINDINGS: Enlarged cardiopericardial silhouette with calcified aorta. Left upper chest pacemaker. Small right effusion. Linear opacity at the bases. No pneumothorax. Developing interstitial changes. If there is further concern of the sequela of trauma, contrast CT could be considered as clinically appropriate for further sensitivity of injury IMPRESSION: Enlarged heart. Pacemaker. New bilateral interstitial changes. Recommend follow up Small right pleural effusion.  Basilar opacities. Electronically Signed   By: Karen Kays M.D.   On: 08/13/2023 17:24    Procedures Procedures    Medications Ordered in ED Medications  furosemide (LASIX) injection 40 mg (40 mg Intravenous Given 08/13/23 1828)    ED Course/ Medical Decision Making/ A&P                          Medical Decision Making Amount and/or Complexity of Data Reviewed Labs: ordered. Decision-making details documented in ED Course. Radiology: ordered. Decision-making details documented in ED Course.  Risk Prescription drug management. Decision regarding hospitalization.    This  patient presents to the ED for concern of FOT, SOB; this involves an extensive number of treatment options, and is a complaint that carries with it a high risk of complications and morbidity.  I considered the following differential and admission for this acute, potentially life threatening condition.   MDM:    DDX for trauma includes but is not limited to:  -Head Injury such as skull fx or ICH -Spinal Cord or Vertebral injury - no neck pain but will get CT C-spine given age -Fractures - no peripheral deformity or signs of severe injury to indicate fx's.  No Cp to indicate rib fx's.  Also consider CHF exacerbation/fluid overload given increased WOB and significant peripheral edema. Consider pulmonary edema, pleural effusion. No cough or f/c to suggest PNA/VURI/bronchitis. No CP to indicate arrhythmia or ACS.  Clinical Course as of 08/13/23 2047  Wynelle Link Aug 13, 2023  1720 INR(!): 2.3 [HN]  1721 Lactic Acid, Venous: 1.6 wnl [HN]  1721 WBC: 6.6 No leukocytosis  [HN]  1721 Platelets(!): 131 Mild thrombocytopenia, history of same [HN]  1722 Hemoglobin(!): 10.9 Down from 11/6 one year ago [HN]  1723 Giving 40 mg IV lasix [HN]  1723 CXR on my read shows cardiomegaly, pulm edema, and R pleural effusion similar to 12 days ago [HN]  1808 CT HEAD WO CONTRAST 1. No acute intracranial abnormality. 2. No acute displaced fracture or traumatic listhesis of the cervical spine. 3. Small volume right pleural effusion.   [HN]  1808 DG Pelvis Portable Mild degenerative changes.  Hyperostosis.  Osteopenia [HN]  1808 DG Chest Port 1 View Enlarged heart. Pacemaker. New bilateral interstitial changes. Recommend follow up  Small right pleural effusion.  Basilar opacities.   [HN]  2046 Patient cleared from traumatic perspective. He has enlarged heart on CXR w/ peripheral volume overload and pulm edema on CXR, likely w/ heart failure. Had o/p echo ordered but no diagnosis as of yet of heart failure. Patient  is robustly putting out urine in response to the Lasix.  I discussed with the daughter and the patient at bedside about admission versus discharge.  Patient is extremely weak, and also recently was given outpatient Lasix.  I do believe he has been worsening in spite of that and would warrant some inpatient diuresis, as well as an echocardiogram.  I consulted and admitted the patient to medicine.  [HN]    Clinical Course User Index [HN] Loetta Rough, MD    Labs: I Ordered, and personally interpreted labs.  The pertinent results include:  those listed above  Imaging Studies ordered: I ordered imaging studies including CTH, CT C-spine, CXR, PXR I independently visualized and interpreted imaging. I agree with the radiologist interpretation  Additional history obtained from chart review, daughter at bedside  Reevaluation: After the interventions noted above, I reevaluated the patient and found that they have :stayed the same  Social Determinants of Health: Lives independently  Disposition:  Admitted to medicine  Co morbidities that complicate the patient evaluation  Past Medical History:  Diagnosis Date   A-fib [facility])    Arthritis    CAD (coronary artery disease)    GXT, neg bruce protocol GXT   Chronic kidney disease    RENAL INSUFFICIENCY   Diverticulosis    ED (erectile dysfunction)    GERD (gastroesophageal reflux disease)    Glaucoma    Gout    no flare in years    Heart disorder    Hypertension    Obesity    Squamous cell carcinoma in situ      Medicines Meds ordered this encounter  Medications   furosemide (LASIX) injection 40 mg    I have reviewed the patients home medicines and have made adjustments as needed  Problem List / ED Course: Problem List Items Addressed This Visit   None Visit Diagnoses     Fall in home, initial encounter    -  Primary   Acute on chronic congestive heart failure, unspecified heart failure type (HCC)       Relevant  Medications   furosemide (LASIX) injection 40 mg (  Completed)                   This note was created using dictation software, which may contain spelling or grammatical errors.    Loetta Rough, MD 08/13/23 [phone]

## 2023-08-14 ENCOUNTER — Inpatient Hospital Stay (HOSPITAL_COMMUNITY): Payer: Medicare Other

## 2023-08-14 ENCOUNTER — Encounter (HOSPITAL_COMMUNITY): Payer: Self-pay | Admitting: Family Medicine

## 2023-08-14 ENCOUNTER — Ambulatory Visit: Payer: Medicare Other | Admitting: Family Medicine

## 2023-08-14 ENCOUNTER — Other Ambulatory Visit: Payer: Self-pay

## 2023-08-14 DIAGNOSIS — E8770 Fluid overload, unspecified: Secondary | ICD-10-CM

## 2023-08-14 DIAGNOSIS — I5031 Acute diastolic (congestive) heart failure: Secondary | ICD-10-CM

## 2023-08-14 LAB — CBC WITH DIFFERENTIAL/PLATELET
Abs Immature Granulocytes: 0.02 10*3/uL (ref 0.00–0.07)
Basophils Absolute: 0.1 10*3/uL (ref 0.0–0.1)
Basophils Relative: 1 %
Eosinophils Absolute: 0.6 10*3/uL — ABNORMAL HIGH (ref 0.0–0.5)
Eosinophils Relative: 9 %
HCT: 34.8 % — ABNORMAL LOW (ref 39.0–52.0)
Hemoglobin: 10.4 g/dL — ABNORMAL LOW (ref 13.0–17.0)
Immature Granulocytes: 0 %
Lymphocytes Relative: 9 %
Lymphs Abs: 0.6 10*3/uL — ABNORMAL LOW (ref 0.7–4.0)
MCH: 29.3 pg (ref 26.0–34.0)
MCHC: 29.9 g/dL — ABNORMAL LOW (ref 30.0–36.0)
MCV: 98 fL (ref 80.0–100.0)
Monocytes Absolute: 0.8 10*3/uL (ref 0.1–1.0)
Monocytes Relative: 11 %
Neutro Abs: 5 10*3/uL (ref 1.7–7.7)
Neutrophils Relative %: 70 %
Platelets: 125 10*3/uL — ABNORMAL LOW (ref 150–400)
RBC: 3.55 MIL/uL — ABNORMAL LOW (ref 4.22–5.81)
RDW: 16.7 % — ABNORMAL HIGH (ref 11.5–15.5)
WBC: 7.1 10*3/uL (ref 4.0–10.5)
nRBC: 0 % (ref 0.0–0.2)

## 2023-08-14 LAB — ECHOCARDIOGRAM COMPLETE
AR max vel: 0.68 cm2
AV Area VTI: 0.61 cm2
AV Area mean vel: 0.7 cm2
AV Mean grad: 18.5 mm[Hg]
AV Peak grad: 29.5 mm[Hg]
Ao pk vel: 2.72 m/s
Area-P 1/2: 2.9 cm2
Height: 64 in
S' Lateral: 3 cm
Weight: 3008.84 [oz_av]

## 2023-08-14 LAB — BASIC METABOLIC PANEL
Anion gap: 10 (ref 5–15)
BUN: 25 mg/dL — ABNORMAL HIGH (ref 8–23)
CO2: 28 mmol/L (ref 22–32)
Calcium: 9.2 mg/dL (ref 8.9–10.3)
Chloride: 106 mmol/L (ref 98–111)
Creatinine, Ser: 1.38 mg/dL — ABNORMAL HIGH (ref 0.61–1.24)
GFR, Estimated: 49 mL/min — ABNORMAL LOW (ref >60)
Glucose, Bld: 91 mg/dL (ref 70–99)
Potassium: 4.4 mmol/L (ref 3.5–5.1)
Sodium: 144 mmol/L (ref 135–145)

## 2023-08-14 LAB — PROTIME-INR
INR: 2.5 — ABNORMAL HIGH (ref 0.8–1.2)
Prothrombin Time: 27 s — ABNORMAL HIGH (ref 11.4–15.2)

## 2023-08-14 LAB — MAGNESIUM: Magnesium: 1.7 mg/dL (ref 1.7–2.4)

## 2023-08-14 MED ORDER — HYDRALAZINE HCL 20 MG/ML IJ SOLN: 10.0000 mg | INTRAMUSCULAR | Status: DC | PRN

## 2023-08-14 MED ORDER — NYSTATIN 100000 UNIT/GM EX CREA
TOPICAL_CREAM | Freq: Two times a day (BID) | CUTANEOUS | Status: DC
  Administered 2023-08-15 – 2023-08-16 (×2): 1 via TOPICAL
  Filled 2023-08-14: qty 30

## 2023-08-14 MED ORDER — ALLOPURINOL 100 MG PO TABS
100.0000 mg | ORAL_TABLET | Freq: Every day | ORAL | Status: DC
  Administered 2023-08-14 – 2023-08-19 (×6): 100 mg via ORAL
  Filled 2023-08-14 (×6): qty 1

## 2023-08-14 MED ORDER — LATANOPROST 0.005 % OP SOLN
1.0000 [drp] | Freq: Every day | OPHTHALMIC | Status: DC
  Administered 2023-08-14 – 2023-08-20 (×7): 1 [drp] via OPHTHALMIC
  Filled 2023-08-14: qty 2.5

## 2023-08-14 MED ORDER — TAMSULOSIN HCL 0.4 MG PO CAPS
0.8000 mg | ORAL_CAPSULE | Freq: Every day | ORAL | Status: DC
  Administered 2023-08-14 – 2023-08-21 (×7): 0.8 mg via ORAL
  Filled 2023-08-14 (×9): qty 2

## 2023-08-14 MED ORDER — DRONABINOL 2.5 MG PO CAPS: 2.5000 mg | ORAL_CAPSULE | Freq: Two times a day (BID) | ORAL | Status: DC

## 2023-08-14 MED ORDER — LISINOPRIL 10 MG PO TABS
10.0000 mg | ORAL_TABLET | Freq: Every day | ORAL | Status: DC
  Administered 2023-08-14: 10 mg via ORAL
  Filled 2023-08-14: qty 1

## 2023-08-14 MED ORDER — IPRATROPIUM-ALBUTEROL 0.5-2.5 (3) MG/3ML IN SOLN: 3.0000 mL | RESPIRATORY_TRACT | Status: DC | PRN

## 2023-08-14 MED ORDER — WARFARIN SODIUM 2.5 MG PO TABS
2.5000 mg | ORAL_TABLET | Freq: Once | ORAL | Status: AC
  Administered 2023-08-14: 2.5 mg via ORAL
  Filled 2023-08-14 (×2): qty 1

## 2023-08-14 MED ORDER — MAGNESIUM SULFATE IN D5W 1-5 GM/100ML-% IV SOLN
1.0000 g | Freq: Once | INTRAVENOUS | Status: AC
  Administered 2023-08-14: 1 g via INTRAVENOUS
  Filled 2023-08-14: qty 100

## 2023-08-14 NOTE — Progress Notes (Signed)
PHARMACY - ANTICOAGULATION CONSULT NOTE  Pharmacy Consult for Warfarin  Indication: atrial fibrillation  Allergies  Allergen Reactions   Tramadol Itching    Patient Measurements: Height: 5\' 4"  (162.6 cm) Weight: 85.3 kg (188 lb 0.8 oz) IBW/kg (Calculated) : 59.2  Vital Signs: Temp: 97.6 F (36.4 C) (12/02 0218) Temp Source: Oral (12/02 0218) BP: 161/93 (12/02 0400) Pulse Rate: 46 (12/02 0400)  Labs: Recent Labs    08/13/23 1644 08/13/23 1653 08/14/23 0505  HGB 10.9* 12.6* 10.4*  HCT 37.2* 37.0* 34.8*  PLT 131*  --  125*  LABPROT 25.2*  --  27.0*  INR 2.3*  --  2.5*  CREATININE 1.58* 1.60* 1.38*    Estimated Creatinine Clearance: 35 mL/min (A) (by C-G formula based on SCr of 1.38 mg/dL (H)).   Medical History: Past Medical History:  Diagnosis Date   A-fib [facility])    Arthritis    CAD (coronary artery disease)    GXT, neg bruce protocol GXT   Chronic kidney disease    RENAL INSUFFICIENCY   Diverticulosis    ED (erectile dysfunction)    GERD (gastroesophageal reflux disease)    Glaucoma    Gout    no flare in years    Heart disorder    Hypertension    Obesity    Squamous cell carcinoma in situ     Assessment: 89 y/o M on warfarin PTA for afib, presents to the ED s/p fall, CT head negative, continuing warfarin  PTA dosing: 2.5mg  daily except 1.25mg  Sunday and Wed  INR therapeutic this AM  Goal of Therapy:  INR 2-3 Monitor platelets by anticoagulation protocol: Yes   Plan:  Warfarin 2.5 mg PO x 1 today Daily INR, s/s bleeding  Daylene Posey, PharmD, [facility] Clinical Pharmacist ED Pharmacist Phone # [phone] 08/14/2023 7:14 AM

## 2023-08-14 NOTE — ED Notes (Signed)
Incorrect temp documented at 2:14, wrong patient.

## 2023-08-14 NOTE — ED Notes (Signed)
Total for the shift is 1300 mls of urine.

## 2023-08-14 NOTE — Evaluation (Signed)
Physical Therapy Evaluation Patient Details Name: [name]. MRN: [id number] DOB: [birth year] Today's Date: 08/14/2023  History of Present Illness  14 male who presents after a witnessed fall getting out of his car. Pt sustained no LOC, CT head negative. Patient's daughter states that her daughter who is a nurse told her it looked like there was fluid on his lungs, being worked up at [facility] for fluid overload. PMH afib, artritis, CAD, CKD, Diverticulosis, Gout, Glaucoma, HTN.  Clinical Impression  Pt presents with admitting diagnosis above. Pt today was able to ambulate short distance in hallway with RW CGA. Pt noted with L inattention and needed constant cues for safety, navigating obstacles, and proximity to RW. PTA pt reports he was Mod I with RW and lives with daughter who helps with ADLs. Recommend HHPT upon DC. PT will continue to follow.       If plan is discharge home, recommend the following: A little help with walking and/or transfers;A little help with bathing/dressing/bathroom;Assistance with cooking/housework;Assist for transportation;Help with stairs or ramp for entrance;Supervision due to cognitive status   Can travel by private vehicle        Equipment Recommendations None recommended by PT  Recommendations for Other Services       Functional Status Assessment Patient has had a recent decline in their functional status and demonstrates the ability to make significant improvements in function in a reasonable and predictable amount of time.     Precautions / Restrictions Precautions Precautions: Fall Restrictions Weight Bearing Restrictions: No      Mobility  Bed Mobility Overal bed mobility: Needs Assistance Bed Mobility: Supine to Sit, Sit to Supine     Supine to sit: Min assist, HOB elevated Sit to supine: Min assist, HOB elevated   General bed mobility comments: Pt grossly min A to raise trunk and for BLE assist    Transfers Overall transfer level:  Needs assistance Equipment used: Rolling walker (2 wheels) Transfers: Sit to/from Stand Sit to Stand: Contact guard assist           General transfer comment: cues for hand placement. Cues needed during ambulation for pt to maintain use of RW and attend to obstacles on L side    Ambulation/Gait Ambulation/Gait assistance: Contact guard assist Gait Distance (Feet): 50 Feet Assistive device: Rolling walker (2 wheels) Gait Pattern/deviations: Trunk flexed, Staggering left, Decreased stride length, Step-through pattern Gait velocity: Decreased     General Gait Details: no LOB noted. Pt noted to have some L inattention by frequently running into obstacles on L side. 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 Constant cues for safety and proximity to RW.  Stairs            Wheelchair Mobility     Tilt Bed    Modified Rankin (Stroke Patients Only)       Balance Overall balance assessment: Needs assistance Sitting-balance support: Feet supported, No upper extremity supported Sitting balance-Leahy Scale: Fair Sitting balance - Comments: sitting EOB on stretcher   Standing balance support: During functional activity, Bilateral upper extremity supported Standing balance-Leahy Scale: Poor Standing balance comment: RW support                             Pertinent Vitals/Pain Pain Assessment Pain Assessment: Faces Faces Pain Scale: Hurts little more Pain Location: R leg (swelling) Pain Descriptors / Indicators: Discomfort, Aching Pain Intervention(s): Monitored during session, Repositioned  Home Living Family/patient expects to be discharged to:: Private residence Living Arrangements: Children (daughter) Available Help at Discharge: Family;Available 24 hours/day Type of Home: House Home Access: Stairs to enter Entrance Stairs-Rails: Can reach both;Right;Left Entrance Stairs-Number of Steps: 6   Home Layout: One level Home Equipment: Advice worker (2 wheels);Hand held shower head;Grab bars - tub/shower;Grab bars - toilet Additional Comments: Pt reports his daughter is a retired Runner, broadcasting/film/video, per notes it says retired Charity fundraiser. pt reports one other fall in the last year.    Prior Function Prior Level of Function : History of Falls (last six months);Independent/Modified Independent             Mobility Comments: ind with RW ADLs Comments: Ind, daughter assists with driving and iADLs     Extremity/Trunk Assessment   Upper Extremity Assessment Upper Extremity Assessment: Overall WFL for tasks assessed    Lower Extremity Assessment Lower Extremity Assessment: Overall WFL for tasks assessed    Cervical / Trunk Assessment Cervical / Trunk Assessment: Kyphotic  Communication   Communication Communication: No apparent difficulties  Cognition Arousal: Alert Behavior During Therapy: WFL for tasks assessed/performed Overall Cognitive Status: No family/caregiver present to determine baseline cognitive functioning                                 General Comments: A&Ox4, could be a little HOH but needs some questions repeated as pt may not respond appropriately to the original question that was asked        General Comments General comments (skin integrity, edema, etc.): VSS on RA, bruise on L side of face and L deltoid. heamtoma on head, bandaged    Exercises     Assessment/Plan    PT Assessment Patient needs continued PT services  PT Problem List Decreased strength;Decreased range of motion;Decreased activity tolerance;Decreased balance;Decreased mobility;Decreased coordination;Decreased knowledge of use of DME;Decreased safety awareness;Decreased knowledge of precautions;Cardiopulmonary status limiting activity       PT Treatment Interventions DME instruction;Gait training;Functional mobility training;Stair training;Therapeutic activities;Therapeutic exercise;Balance training;Neuromuscular  re-education;Patient/family education    PT Goals (Current goals can be found in the Care Plan section)  Acute Rehab PT Goals Patient Stated Goal: to go home PT Goal Formulation: With patient Time For Goal Achievement: 08/28/23 Potential to Achieve Goals: Fair    Frequency Min 1X/week     Co-evaluation               AM-PAC PT "6 Clicks" Mobility  Outcome Measure Help needed turning from your back to your side while in a flat bed without using bedrails?: A Little Help needed moving from lying on your back to sitting on the side of a flat bed without using bedrails?: A Little Help needed moving to and from a bed to a chair (including a wheelchair)?: A Little Help needed standing up from a chair using your arms (e.g., wheelchair or bedside chair)?: A Little Help needed to walk in hospital room?: A Little Help needed climbing 3-5 steps with a railing? : A Lot 6 Click Score: 17    End of Session Equipment Utilized During Treatment: Gait belt Activity Tolerance: Patient tolerated treatment well Patient left: in bed;with call bell/phone within reach Nurse Communication: Mobility status PT Visit Diagnosis: Other abnormalities of gait and mobility (R26.89)    Time: 9562-1308 PT Time Calculation (min) (ACUTE ONLY): 27 min   Charges:   PT Evaluation $PT Eval Moderate Complexity: 1  Mod   PT General Charges $$ ACUTE PT VISIT: 1 Visit         Shela Nevin, PT, DPT Acute Rehab Services [phone]   Gladys Damme 08/14/2023, 1:42 PM

## 2023-08-14 NOTE — Evaluation (Signed)
Occupational Therapy Evaluation Patient Details Name: [name]. MRN: [id number] DOB: 1930/09/18 Today's Date: 08/14/2023   History of Present Illness 14 male who presents after a witnessed fall getting out of his car. Pt sustained no LOC, CT head negative. Patient's daughter states that her daughter who is a nurse told her it looked like there was fluid on his lungs, being worked up at [facility] for fluid overload. PMH afib, artritis, CAD, CKD, Diverticulosis, Gout, Glaucoma, HTN.   Clinical Impression   Pt admitted for above, presents as grossly CGA for OOB mobility and needs CGA to setup assist for most ADLs other than LBD to which he needed Max A but uses AE at baseline. He displays some L inattention when ambulating, unsure of baseline cog but he reports a hx of bumping into things on L side. Reinforced use of scanning strategies. Pt would benefit from continued acute skilled OT services to address deficits and help transition to next level of care. Patient would benefit from post acute Home OT services to help maximize functional independence in natural environment         If plan is discharge home, recommend the following: A little help with bathing/dressing/bathroom;A little help with walking and/or transfers;Assistance with cooking/housework    Functional Status Assessment  Patient has had a recent decline in their functional status and demonstrates the ability to make significant improvements in function in a reasonable and predictable amount of time.  Equipment Recommendations  None recommended by OT    Recommendations for Other Services       Precautions / Restrictions Precautions Precautions: Fall Restrictions Weight Bearing Restrictions: No      Mobility Bed Mobility Overal bed mobility: Needs Assistance Bed Mobility: Supine to Sit, Sit to Supine     Supine to sit: Min assist, HOB elevated Sit to supine: Min assist, HOB elevated   General bed mobility comments:  Pt grossly min A to raise trunk and for BLE assist    Transfers Overall transfer level: Needs assistance Equipment used: Rolling walker (2 wheels) Transfers: Sit to/from Stand Sit to Stand: Contact guard assist           General transfer comment: cues for hand placement. Cues needed during ambulation for pt to maintain use of RW and attend to obstacles on L side      Balance Overall balance assessment: Needs assistance Sitting-balance support: Feet supported, No upper extremity supported Sitting balance-Leahy Scale: Fair Sitting balance - Comments: sitting EOB on stretcher   Standing balance support: During functional activity, Bilateral upper extremity supported Standing balance-Leahy Scale: Poor Standing balance comment: RW support                           ADL either performed or assessed with clinical judgement   ADL Overall ADL's : Needs assistance/impaired Eating/Feeding: Independent;Sitting   Grooming: Standing;Oral care;Wash/dry face;Contact guard assist   Upper Body Bathing: Sitting;Set up   Lower Body Bathing: Sitting/lateral leans;Contact guard assist   Upper Body Dressing : Sitting;Set up   Lower Body Dressing: Maximal assistance;Sitting/lateral leans Lower Body Dressing Details (indicate cue type and reason): Pt uses sockaid at baseline, assist needed to don socks Toilet Transfer: Contact guard assist;Ambulation;Rolling walker (2 wheels)   Toileting- Clothing Manipulation and Hygiene: Contact guard assist;Sit to/from stand       Functional mobility during ADLs: Contact guard assist;Rolling walker (2 wheels) General ADL Comments: Educated pt on visual scanning to reduce  risk of bumping into objects on L side     Vision   Additional Comments: Pt tracks in all planes, no signs of dysmetria, some L inattention with mobilty     Perception         Praxis         Pertinent Vitals/Pain Pain Assessment Pain Assessment: Faces Faces Pain  Scale: Hurts little more Pain Location: R leg (swelling) Pain Descriptors / Indicators: Discomfort, Aching Pain Intervention(s): Monitored during session, Repositioned     Extremity/Trunk Assessment Upper Extremity Assessment Upper Extremity Assessment: Overall WFL for tasks assessed   Lower Extremity Assessment Lower Extremity Assessment: Defer to PT evaluation (BLE swelling, R leg worse than L. BLEs reddish and warm to touch)   Cervical / Trunk Assessment Cervical / Trunk Assessment: Kyphotic   Communication     Cognition Arousal: Alert Behavior During Therapy: WFL for tasks assessed/performed Overall Cognitive Status: No family/caregiver present to determine baseline cognitive functioning                                 General Comments: A&Ox4, could be a little HOH but needs some questions repeated as pt may not respond appropriately to the original question that was asked     General Comments  VSS on RA, bruise on L side of face and L deltoid. heamtoma on head, bandaged    Exercises     Shoulder Instructions      Home Living Family/patient expects to be discharged to:: Private residence Living Arrangements: Children (daughter) Available Help at Discharge: Family;Available 24 hours/day Type of Home: House Home Access: Stairs to enter [employer] of Steps: 6 Entrance Stairs-Rails: Can reach both;Right;Left Home Layout: One level     Bathroom Shower/Tub: Producer, television/film/video: Standard     Home Equipment: Pharmacist, hospital (2 wheels);Hand held shower head;Grab bars - tub/shower;Grab bars - toilet   Additional Comments: Pt reports his daughter is a retired Runner, broadcasting/film/video, per notes it says retired Charity fundraiser. pt reports one other fall in the last year.      Prior Functioning/Environment Prior Level of Function : History of Falls (last six months);Independent/Modified Independent             Mobility Comments: ind with  RW ADLs Comments: Ind, daughter assists with driving and iADLs        OT Problem List: Impaired balance (sitting and/or standing);Increased edema      OT Treatment/Interventions: Self-care/ADL training;Balance training;Therapeutic exercise;DME and/or AE instruction;Therapeutic activities;Patient/family education    OT Goals(Current goals can be found in the care plan section) Acute Rehab OT Goals Patient Stated Goal: To go home OT Goal Formulation: With patient Time For Goal Achievement: 08/28/23 Potential to Achieve Goals: Good ADL Goals Pt Will Perform Lower Body Dressing: with supervision;sit to/from stand;with adaptive equipment (uses AE at baseline for socks) Pt Will Transfer to Toilet: with supervision;ambulating Pt Will Perform Toileting - Clothing Manipulation and hygiene: with supervision;sit to/from stand Pt Will Perform Tub/Shower Transfer: Shower transfer;ambulating;grab bars;with supervision Additional ADL Goal #1: Pt will avoid 3/3 obstacles on L hand side when ambulating with RW  OT Frequency: Min 1X/week    Co-evaluation              AM-PAC OT "6 Clicks" Daily Activity     Outcome Measure Help from another person eating meals?: None Help from another person taking care of personal grooming?: A Little  Help from another person toileting, which includes using toliet, bedpan, or urinal?: A Little Help from another person bathing (including washing, rinsing, drying)?: A Little Help from another person to put on and taking off regular upper body clothing?: A Little Help from another person to put on and taking off regular lower body clothing?: A Lot 6 Click Score: 18   End of Session Equipment Utilized During Treatment: Rolling walker (2 wheels);Gait belt Nurse Communication: Mobility status  Activity Tolerance: Patient tolerated treatment well Patient left: in bed;with call bell/phone within reach;Other (comment) (xray tech in room)  OT Visit Diagnosis:  Unsteadiness on feet (R26.81);History of falling (Z91.81)                Time: 1610-9604 OT Time Calculation (min): 30 min Charges:  OT General Charges $OT Visit: 1 Visit OT Evaluation $OT Eval Low Complexity: 1 Low  08/14/2023  AB, OTR/L  Acute Rehabilitation Services  Office: [phone]   Tristan Schroeder 08/14/2023, 10:09 AM

## 2023-08-14 NOTE — Progress Notes (Signed)
PROGRESS NOTE    [place].  GLO:[id number] DOB: [birth year] DOA: 08/13/2023 PCP: Ronnald Nian, MD     Brief Narrative:   From admission h and p  This is a 89 y/o male w/ PMHx of CAD, chronic thrombocytopenia, HLD, HTN, permanent A-fib, chronic anticoagulation, sp PPM, history of gout.  She follows with [facility] cardiology.  At baseline patient weak, uses a walker for ambulation.  Today while getting out of his car he just fell.  There is no reports of loss of consciousness.  It was a witnessed fall.  He did hit his head.  There was no seizure.  Per daughter patient has been getting progressively weaker over several weeks.  He has chronic diarrhea ~5x daily for approximately a year. (His daughter was not aware of this timeline).  He denies abdominal pain, blood in stool.  Remote history of colonoscopy.  He denies being lightheaded or dizzy.  There is report of chronic and worsening shortness of breath and increasing lower extremity swelling.  Patient's appetite is poor.  Patient losing interest in his usual entertainment and is mostly sleeping.  At the end of October he was able to urgent care for lower extremity swelling on the right.  Ultrasound done and negative.  He has gained at least 8 pounds in last 3 weeks.  There is no reports of chest pains.  His daughter brought him to the ER.   In the ER patient is hemodynamically stable.  CXR shows bibasilar opacities.  Patient's fluid overloaded on examination.  EKG atrial fibrillation, rate controlled, normal QT.    Assessment & Plan:   Principal Problem:   Fluid overload Active Problems:   Hyperlipidemia LDL goal <70   Primary hypertension   Long term (current) use of anticoagulants   CAD (coronary artery disease)   Permanent atrial fibrillation (HCC)   Fall at home, initial encounter    Acute congestive heart failure -Appears chronic but patient without that diagnosis -CHF order set initiated. - continue IV Lasix, strict I's and  O's, daily weights -2D echo pending -Cardiology consult pending -Nebulizers as needed, stable room air    lower extremity venous stasis Admitting provider thinks cellulitis but skin changes are more consistent with chronic venous stasis, also appears to have hematoma RLE probably from fall. Note there is a documented temp of 103 this morning but nursing documented this was incorrect (from a different patient). So here afebrile, no leukocytosis, etc. - photo from today (12/2) is in media tab - hold on further abx and monitor    Intertrigo in moist folds/groin -Nystatin cream  # Hypertension Here BPs in the 170s in setting of fluid overload, appears only bp med at home is coret - continue coreg - hydral prn - diuresis as above - consider adding additional agent(s) if bp fails to improve with diuresis    Phycical deconditioning //  Fall at home, initial encounter Many contributing etiology including age-related deconditioning, dehydration from diarrhea, weakness from poor appetite malnutrition, cellulitis right lower extremity -PT/OT consult  # Right lower extremity pain After fall, has history knee replacement - f/u x-ray    CAD (coronary artery disease) //  Primary hypertension -Coreg, simvastatin, Coreg resumed    Thrombocytopenia - chronic -Baseline stable    Hyperlipidemia LDL goal <70 -Simvastatin resumed    Permanent atrial fibrillation (HCC)  Chronic anticoagulation -Coumadin resumed, pharmacy to dose    Gout -Resume allopurinol   BPH - home flomax and finasteride  DVT prophylaxis: home warfarin Code Status: dnr Family Communication: daughter updated telephonically 12/2  Level of care: Telemetry Cardiac Status is: Inpatient Remains inpatient appropriate because: need for further inpatient w/u    Consultants:  cardiology  Procedures: none  Antimicrobials:  S/p ceftriaxone    Subjective: Pain RLE, no other complaints  Objective: Vitals:    08/14/23 0224 08/14/23 0300 08/14/23 0400 08/14/23 0823  BP: (!) 189/89 (!) 151/137 (!) 161/93 (!) 174/98  Pulse:  72 (!) 46 91  Resp:  16 17 (!) 23  Temp:    98.1 F (36.7 C)  TempSrc:    Oral  SpO2:  94% 95% 92%  Weight:      Height:        Intake/Output Summary (Last 24 hours) at 08/14/2023 0904 Last data filed at 08/13/2023 2250 Gross per 24 hour  Intake 5 ml  Output 875 ml  Net -870 ml   Filed Weights   08/13/23 1628  Weight: 85.3 kg    Examination:  General exam: Appears calm and comfortable  Respiratory system: rales at bases Cardiovascular system: S1 & S2 heard, irreg irreg. Soft systolic murmur Gastrointestinal system: Abdomen is obese, soft and nontender.   Central nervous system: Alert and oriented. No focal neurological deficits. Extremities: Symmetric 5 x 5 power. Skin: abrasion on toes, bandage right head, discoloration of lower half of bilateral lower extremities  Psychiatry: Judgement and insight appear normal. Mood & affect appropriate.     Data Reviewed: I have personally reviewed following labs and imaging studies  CBC: Recent Labs  Lab 08/13/23 1644 08/13/23 1653 08/14/23 0505  WBC 6.6  --  7.1  NEUTROABS  --   --  5.0  HGB 10.9* 12.6* 10.4*  HCT 37.2* 37.0* 34.8*  MCV 99.7  --  98.0  PLT 131*  --  125*   Basic Metabolic Panel: Recent Labs  Lab 08/13/23 1644 08/13/23 1653 08/14/23 0505  NA 142 142 144  K 4.3 4.2 4.4  CL 110 108 106  CO2 24  --  28  GLUCOSE 118* 114* 91  BUN 26* 27* 25*  CREATININE 1.58* 1.60* 1.38*  CALCIUM 9.2  --  9.2  MG  --   --  1.7   GFR: Estimated Creatinine Clearance: 35 mL/min (A) (by C-G formula based on SCr of 1.38 mg/dL (H)). Liver Function Tests: Recent Labs  Lab 08/13/23 1644  AST 27  ALT 12  ALKPHOS 86  BILITOT 1.1  PROT 6.0*  ALBUMIN 3.2*   No results for input(s): "LIPASE", "AMYLASE" in the last 168 hours. No results for input(s): "AMMONIA" in the last 168 hours. Coagulation  Profile: Recent Labs  Lab 08/13/23 1644 08/14/23 0505  INR 2.3* 2.5*   Cardiac Enzymes: No results for input(s): "CKTOTAL", "CKMB", "CKMBINDEX", "TROPONINI" in the last 168 hours. BNP (last 3 results) No results for input(s): "PROBNP" in the last 8760 hours. HbA1C: No results for input(s): "HGBA1C" in the last 72 hours. CBG: No results for input(s): "GLUCAP" in the last 168 hours. Lipid Profile: No results for input(s): "CHOL", "HDL", "LDLCALC", "TRIG", "CHOLHDL", "LDLDIRECT" in the last 72 hours. Thyroid Function Tests: No results for input(s): "TSH", "T4TOTAL", "FREET4", "T3FREE", "THYROIDAB" in the last 72 hours. Anemia Panel: No results for input(s): "VITAMINB12", "FOLATE", "FERRITIN", "TIBC", "IRON", "RETICCTPCT" in the last 72 hours. Urine analysis:    Component Value Date/Time   COLORURINE STRAW (A) 08/13/2023 2031   APPEARANCEUR CLEAR 08/13/2023 2031   LABSPEC 1.005 08/13/2023 2031  PHURINE 5.0 08/13/2023 2031   GLUCOSEU NEGATIVE 08/13/2023 2031   HGBUR MODERATE (A) 08/13/2023 2031   BILIRUBINUR NEGATIVE 08/13/2023 2031   KETONESUR NEGATIVE 08/13/2023 2031   PROTEINUR NEGATIVE 08/13/2023 2031   NITRITE NEGATIVE 08/13/2023 2031   LEUKOCYTESUR TRACE (A) 08/13/2023 2031   Sepsis Labs: @LABRCNTIP (procalcitonin:4,lacticidven:4)  )No results found for this or any previous visit (from the past 240 hour(s)).       Radiology Studies: CT HEAD WO CONTRAST  Result Date: 08/13/2023 CLINICAL DATA:  Head trauma, moderate-severe; Polytrauma, blunt EXAM: CT HEAD WITHOUT CONTRAST CT CERVICAL SPINE WITHOUT CONTRAST TECHNIQUE: Multidetector CT imaging of the head and cervical spine was performed following the standard protocol without intravenous contrast. Multiplanar CT image reconstructions of the cervical spine were also generated. RADIATION DOSE REDUCTION: This exam was performed according to the departmental dose-optimization program which includes automated exposure  control, adjustment of the mA and/or kV according to patient size and/or use of iterative reconstruction technique. COMPARISON:  Chest x-ray 08/13/2023 FINDINGS: CT HEAD FINDINGS Brain: Cerebral ventricle sizes are concordant with the degree of cerebral volume loss. Patchy and confluent areas of decreased attenuation are noted throughout the deep and periventricular white matter of the cerebral hemispheres bilaterally, compatible with chronic microvascular ischemic disease. No evidence of large-territorial acute infarction. No parenchymal hemorrhage. No mass lesion. No extra-axial collection. No mass effect or midline shift. No hydrocephalus. Basilar cisterns are patent. Vascular: No hyperdense vessel. Atherosclerotic calcifications are present within the cavernous internal carotid and vertebral arteries. Skull: No acute fracture or focal lesion. Sinuses/Orbits: Paranasal sinuses and mastoid air cells are clear. Bilateral lens replacement. Otherwise the orbits are unremarkable. Other: 7 mm right scalp hematoma. CT CERVICAL SPINE FINDINGS Alignment: Normal. Skull base and vertebrae: Multilevel moderate degenerative change of the spine. Associated moderate to severe bilateral C3-C4 C5-C6 osseous neural foraminal stenosis. No severe osseous central canal stenosis. No acute fracture. No aggressive appearing focal osseous lesion or focal pathologic process. Soft tissues and spinal canal: No prevertebral fluid or swelling. No visible canal hematoma. Upper chest: Small volume right pleural effusion. Other: Atherosclerotic plaque of the carotid arteries within the neck. IMPRESSION: 1. No acute intracranial abnormality. 2. No acute displaced fracture or traumatic listhesis of the cervical spine. 3. Small volume right pleural effusion. Electronically Signed   By: Tish Frederickson M.D.   On: 08/13/2023 17:28   CT CERVICAL SPINE WO CONTRAST  Result Date: 08/13/2023 CLINICAL DATA:  Head trauma, moderate-severe; Polytrauma,  blunt EXAM: CT HEAD WITHOUT CONTRAST CT CERVICAL SPINE WITHOUT CONTRAST TECHNIQUE: Multidetector CT imaging of the head and cervical spine was performed following the standard protocol without intravenous contrast. Multiplanar CT image reconstructions of the cervical spine were also generated. RADIATION DOSE REDUCTION: This exam was performed according to the departmental dose-optimization program which includes automated exposure control, adjustment of the mA and/or kV according to patient size and/or use of iterative reconstruction technique. COMPARISON:  Chest x-ray 08/13/2023 FINDINGS: CT HEAD FINDINGS Brain: Cerebral ventricle sizes are concordant with the degree of cerebral volume loss. Patchy and confluent areas of decreased attenuation are noted throughout the deep and periventricular white matter of the cerebral hemispheres bilaterally, compatible with chronic microvascular ischemic disease. No evidence of large-territorial acute infarction. No parenchymal hemorrhage. No mass lesion. No extra-axial collection. No mass effect or midline shift. No hydrocephalus. Basilar cisterns are patent. Vascular: No hyperdense vessel. Atherosclerotic calcifications are present within the cavernous internal carotid and vertebral arteries. Skull: No acute fracture or focal lesion. Sinuses/Orbits: Paranasal  sinuses and mastoid air cells are clear. Bilateral lens replacement. Otherwise the orbits are unremarkable. Other: 7 mm right scalp hematoma. CT CERVICAL SPINE FINDINGS Alignment: Normal. Skull base and vertebrae: Multilevel moderate degenerative change of the spine. Associated moderate to severe bilateral C3-C4 C5-C6 osseous neural foraminal stenosis. No severe osseous central canal stenosis. No acute fracture. No aggressive appearing focal osseous lesion or focal pathologic process. Soft tissues and spinal canal: No prevertebral fluid or swelling. No visible canal hematoma. Upper chest: Small volume right pleural  effusion. Other: Atherosclerotic plaque of the carotid arteries within the neck. IMPRESSION: 1. No acute intracranial abnormality. 2. No acute displaced fracture or traumatic listhesis of the cervical spine. 3. Small volume right pleural effusion. Electronically Signed   By: Tish Frederickson M.D.   On: 08/13/2023 17:28   DG Pelvis Portable  Result Date: 08/13/2023 CLINICAL DATA:  Pain after trauma EXAM: PORTABLE PELVIS 1 VIEWS COMPARISON:  None Available. FINDINGS: Hyperostosis. Slight joint space loss of the right hip compared to left. Mild degenerative changes of the sacroiliac joints. Hypertrophic changes of the pubic symphysis. No fracture or dislocation. Prominent degenerative changes along the lumbar spine. Vascular calcifications. Osteopenia. With this level of osteopenia subtle nondisplaced injury is difficult to completely exclude and if needed additional cross-sectional imaging as clinically directed such as CT for further sensitivity. IMPRESSION: Mild degenerative changes.  Hyperostosis.  Osteopenia Electronically Signed   By: Karen Kays M.D.   On: 08/13/2023 17:25   DG Chest Port 1 View  Result Date: 08/13/2023 CLINICAL DATA:  Pain after trauma.  Larey Seat out of a car. EXAM: PORTABLE CHEST 1 VIEW COMPARISON:  X-ray 08/01/2023.  Older exams as well FINDINGS: Enlarged cardiopericardial silhouette with calcified aorta. Left upper chest pacemaker. Small right effusion. Linear opacity at the bases. No pneumothorax. Developing interstitial changes. If there is further concern of the sequela of trauma, contrast CT could be considered as clinically appropriate for further sensitivity of injury IMPRESSION: Enlarged heart. Pacemaker. New bilateral interstitial changes. Recommend follow up Small right pleural effusion.  Basilar opacities. Electronically Signed   By: Karen Kays M.D.   On: 08/13/2023 17:24        Scheduled Meds:  allopurinol  100 mg Oral Daily   carvedilol  3.125 mg Oral BID WC    dronabinol  2.5 mg Oral BID AC   finasteride  5 mg Oral Daily   furosemide  40 mg Intravenous Q12H   latanoprost  1 drop Both Eyes QHS   nystatin cream   Topical BID   simvastatin  20 mg Oral QHS   sodium chloride flush  3 mL Intravenous Q12H   tamsulosin  0.8 mg Oral Daily   warfarin  2.5 mg Oral ONCE-1600   Warfarin - Pharmacist Dosing Inpatient   Does not apply q1600   Continuous Infusions:  sodium chloride       LOS: 1 day     Silvano Bilis, MD [facility]   If 7PM-7AM, please contact night-coverage [email or web address] Password [facility] 08/14/2023, 9:04 AM
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
## 2023-08-15 ENCOUNTER — Ambulatory Visit (HOSPITAL_COMMUNITY): Admission: RE | Admit: 2023-08-15 | Payer: Medicare Other | Source: Ambulatory Visit

## 2023-08-15 ENCOUNTER — Other Ambulatory Visit (HOSPITAL_COMMUNITY): Payer: Self-pay

## 2023-08-15 ENCOUNTER — Telehealth (HOSPITAL_COMMUNITY): Payer: Self-pay | Admitting: Pharmacy Technician

## 2023-08-15 ENCOUNTER — Ambulatory Visit: Payer: Medicare Other

## 2023-08-15 DIAGNOSIS — E8770 Fluid overload, unspecified: Secondary | ICD-10-CM | POA: Diagnosis not present

## 2023-08-15 LAB — BASIC METABOLIC PANEL
Anion gap: 6 (ref 5–15)
BUN: 25 mg/dL — ABNORMAL HIGH (ref 8–23)
CO2: 29 mmol/L (ref 22–32)
Calcium: 8.9 mg/dL (ref 8.9–10.3)
Chloride: 107 mmol/L (ref 98–111)
Creatinine, Ser: 1.71 mg/dL — ABNORMAL HIGH (ref 0.61–1.24)
GFR, Estimated: 38 mL/min — ABNORMAL LOW (ref >60)
Glucose, Bld: 103 mg/dL — ABNORMAL HIGH (ref 70–99)
Potassium: 4.3 mmol/L (ref 3.5–5.1)
Sodium: 142 mmol/L (ref 135–145)

## 2023-08-15 LAB — PROTIME-INR
INR: 3.3 — ABNORMAL HIGH (ref 0.8–1.2)
Prothrombin Time: 33.8 s — ABNORMAL HIGH (ref 11.4–15.2)

## 2023-08-15 LAB — MAGNESIUM: Magnesium: 1.8 mg/dL (ref 1.7–2.4)

## 2023-08-15 MED ORDER — GUAIFENESIN 100 MG/5ML PO LIQD: 5.0000 mL | ORAL | Status: DC | PRN

## 2023-08-15 MED ORDER — TRAZODONE HCL 50 MG PO TABS: 50.0000 mg | ORAL_TABLET | Freq: Every evening | ORAL | Status: DC | PRN

## 2023-08-15 MED ORDER — HYDRALAZINE HCL 20 MG/ML IJ SOLN: 10.0000 mg | INTRAMUSCULAR | Status: DC | PRN

## 2023-08-15 MED ORDER — IPRATROPIUM-ALBUTEROL 0.5-2.5 (3) MG/3ML IN SOLN: 3.0000 mL | RESPIRATORY_TRACT | Status: DC | PRN

## 2023-08-15 MED ORDER — SENNOSIDES-DOCUSATE SODIUM 8.6-50 MG PO TABS: 1.0000 | ORAL_TABLET | Freq: Every evening | ORAL | Status: DC | PRN

## 2023-08-15 MED ORDER — METOPROLOL TARTRATE 5 MG/5ML IV SOLN: 5.0000 mg | INTRAVENOUS | Status: DC | PRN

## 2023-08-15 NOTE — Progress Notes (Signed)
PROGRESS NOTE    [name].  NFA:[id number] DOB: [birth year] DOA: 08/13/2023 PCP: Ronnald Nian, MD    Brief Narrative:  89 y/o male w/ PMHx of CAD, chronic thrombocytopenia, HLD, HTN, permanent A-fib, chronic anticoagulation, sp PPM, history of gout. She follows with [facility] cardiology. At baseline patient weak, uses a walker for ambulation.  On the day of admission had a fall which was witnessed without loss of consciousness or evidence of seizure.  Upon admission noted to have some evidence of volume overload   Assessment & Plan:  Principal Problem:   Fluid overload Active Problems:   Hyperlipidemia LDL goal <70   Primary hypertension   Long term (current) use of anticoagulants   CAD (coronary artery disease)   Permanent atrial fibrillation (HCC)   Fall at home, initial encounter     Acute congestive heart failure with reduced EF, 40% -Echo shows reduced EF with global hypokinesia.  EF is 40%. -Rising Cr, hold lasix today -Continue Coreg, statin. -Cardiology outpatient follow up   Chronic lower extremity skin changes secondary to venous stasis. -Picture in the media tab reviewed.  No clear evidence of infection.  Hold off on antibiotics   Intertrigo in moist folds/groin -Nystatin cream   Essential hypertension -Continue Coreg.  Getting diuresed.  IV as needed   Physical deconditioning with fall Right knee pain -Trauma workup is negative.  PT/OT-home health -Right knee x-ray negative for any acute pathology.  Intact arthroplasty     CAD (coronary artery disease), no chest pain -Coreg, simvastatin, Coreg resumed    Thrombocytopenia - chronic -Baseline stable   Hyperlipidemia LDL goal <70 -Simvastatin resumed   Permanent atrial fibrillation (HCC) History of symptomatic bradycardia status post pacemaker Chronic anticoagulation -Coumadin resumed, pharmacy to dose.  Continue Coreg   Gout -Resume allopurinol   BPH - home flomax and finasteride   DVT  prophylaxis: home warfarin Code Status: dnr Family Communication: Daughter at bedside  Remains inpatient appropriate because: Hopefully dc home tomororw if Cr stable   Subjective: Doing better.  Daughter at bedside.  Unsteady gait is chronic for him.    Examination:  General exam: Appears calm and comfortable; elderly frail  Respiratory system: Clear to auscultation. Respiratory effort normal. Cardiovascular system: S1 & S2 heard, RRR. No JVD, murmurs, rubs, gallops or clicks. No pedal edema. Gastrointestinal system: Abdomen is nondistended, soft and nontender. No organomegaly or masses felt. Normal bowel sounds heard. Central nervous system: Alert and oriented. No focal neurological deficits. Extremities: Symmetric 5 x 5 power. Skin: chronic b/l le skin changes.  Psychiatry: Judgement and insight appear normal. Mood & affect appropriate.                Diet Orders (From admission, onward)     Start     Ordered   08/13/23 2130  Diet Heart Room service appropriate? Yes; Fluid consistency: Thin  Diet effective now       Question Answer Comment  Room service appropriate? Yes   Fluid consistency: Thin      08/13/23 2131            Objective: Vitals:   08/15/23 0339 08/15/23 0800 08/15/23 0954 08/15/23 1000  BP: 121/69 (!) 98/55 136/60   Pulse: 62 63    Resp: 17 15 19 18   Temp: 97.9 F (36.6 C) 97.9 F (36.6 C)    TempSrc: Oral Oral    SpO2: 93% 96%    Weight:      Height:  Intake/Output Summary (Last 24 hours) at 08/15/2023 1407 Last data filed at 08/15/2023 0600 Gross per 24 hour  Intake 180 ml  Output 2350 ml  Net -2170 ml   Filed Weights   08/13/23 1628 08/15/23 0006  Weight: 85.3 kg 87.3 kg    Scheduled Meds:  allopurinol  100 mg Oral Daily   carvedilol  3.125 mg Oral BID WC   finasteride  5 mg Oral Daily   latanoprost  1 drop Both Eyes QHS   nystatin cream   Topical BID   simvastatin  20 mg Oral QHS   sodium chloride flush  3  mL Intravenous Q12H   tamsulosin  0.8 mg Oral Daily   Continuous Infusions:  Nutritional status     Body mass index is 33.04 kg/m.  Data Reviewed:   CBC: Recent Labs  Lab 08/13/23 1644 08/13/23 1653 08/14/23 0505  WBC 6.6  --  7.1  NEUTROABS  --   --  5.0  HGB 10.9* 12.6* 10.4*  HCT 37.2* 37.0* 34.8*  MCV 99.7  --  98.0  PLT 131*  --  125*   Basic Metabolic Panel: Recent Labs  Lab 08/13/23 1644 08/13/23 1653 08/14/23 0505 08/15/23 0303  NA 142 142 144 142  K 4.3 4.2 4.4 4.3  CL 110 108 106 107  CO2 24  --  28 29  GLUCOSE 118* 114* 91 103*  BUN 26* 27* 25* 25*  CREATININE 1.58* 1.60* 1.38* 1.71*  CALCIUM 9.2  --  9.2 8.9  MG  --   --  1.7 1.8   GFR: Estimated Creatinine Clearance: 28.6 mL/min (A) (by C-G formula based on SCr of 1.71 mg/dL (H)). Liver Function Tests: Recent Labs  Lab 08/13/23 1644  AST 27  ALT 12  ALKPHOS 86  BILITOT 1.1  PROT 6.0*  ALBUMIN 3.2*   No results for input(s): "LIPASE", "AMYLASE" in the last 168 hours. No results for input(s): "AMMONIA" in the last 168 hours. Coagulation Profile: Recent Labs  Lab 08/13/23 1644 08/14/23 0505 08/15/23 0303  INR 2.3* 2.5* 3.3*   Cardiac Enzymes: No results for input(s): "CKTOTAL", "CKMB", "CKMBINDEX", "TROPONINI" in the last 168 hours. BNP (last 3 results) No results for input(s): "PROBNP" in the last 8760 hours. HbA1C: No results for input(s): "HGBA1C" in the last 72 hours. CBG: No results for input(s): "GLUCAP" in the last 168 hours. Lipid Profile: No results for input(s): "CHOL", "HDL", "LDLCALC", "TRIG", "CHOLHDL", "LDLDIRECT" in the last 72 hours. Thyroid Function Tests: No results for input(s): "TSH", "T4TOTAL", "FREET4", "T3FREE", "THYROIDAB" in the last 72 hours. Anemia Panel: No results for input(s): "VITAMINB12", "FOLATE", "FERRITIN", "TIBC", "IRON", "RETICCTPCT" in the last 72 hours. Sepsis Labs: Recent Labs  Lab 08/13/23 1654  LATICACIDVEN 1.6    No results  found for this or any previous visit (from the past 240 hour(s)).       Radiology Studies: ECHOCARDIOGRAM COMPLETE  Result Date: 08/14/2023    ECHOCARDIOGRAM REPORT   Patient Name:   [name]. Date of Exam: 08/14/2023 Medical Rec #:  [id number]          Height:       64.0 in Accession #:    [id number]         Weight:       188.1 lb Date of Birth:  03/05/1931          BSA:          1.906 m Patient Age:  89 years           BP:           112/69 mmHg Patient Gender: M                  HR:           80 bpm. Exam Location:  Inpatient Procedure: 2D Echo, Color Doppler and Cardiac Doppler Indications:    CHF-Acute Diastolic I50.31  History:        Patient has prior history of Echocardiogram examinations, most                 recent 10/10/2019. CAD, Aortic Valve Disease; Arrythmias:Atrial                 Fibrillation.  Sonographer:    Darlys Gales Referring Phys: [phone] DEBBY CROSLEY IMPRESSIONS  1. Left ventricular ejection fraction, by estimation, is 40 to 45%. Left ventricular ejection fraction by PLAX is 40 %. The left ventricle has mildly decreased function. The left ventricle demonstrates global hypokinesis. There is mild left ventricular hypertrophy. Left ventricular diastolic function could not be evaluated.  2. Right ventricular systolic function is low normal. The right ventricular size is normal.  3. Left atrial size was severely dilated.  4. Right atrial size was massively dilated.  5. The mitral valve is abnormal. Mild to moderate mitral valve regurgitation.  6. The tricuspid valve is abnormal. Tricuspid valve regurgitation is moderate to severe.  7. The aortic valve is tricuspid. There is moderate calcification of the aortic valve. Aortic valve regurgitation is trivial. Suspect low flow, low gradient moderate to severe aortic valve stenosis. Aortic valve area, by VTI measures 0.61 cm, based on an LVOT diameter of 1.9 cm. Aortic valve mean gradient measures 18.5 mmHg. Aortic valve Vmax measures  2.72 m/s. Peak gradient 29.5 mmHg, DI is 0.22. Comparison(s): Changes from prior study are noted. 10/10/2019: LVEF 50-55%. mild AS - mean gradient 14.8 mmHg. Conclusion(s)/Recommendation(s): Findings concerning for low flow, low gradient severe aortic stenosis. FINDINGS  Left Ventricle: Left ventricular ejection fraction, by estimation, is 40 to 45%. Left ventricular ejection fraction by PLAX is 40 %. The left ventricle has mildly decreased function. The left ventricle demonstrates global hypokinesis. The left ventricular internal cavity size was normal in size. There is mild left ventricular hypertrophy. Left ventricular diastolic function could not be evaluated due to atrial fibrillation. Left ventricular diastolic function could not be evaluated. Right Ventricle: The right ventricular size is normal. No increase in right ventricular wall thickness. Right ventricular systolic function is low normal. Left Atrium: Left atrial size was severely dilated. Right Atrium: Right atrial size was massively dilated. Pericardium: There is no evidence of pericardial effusion. Mitral Valve: The mitral valve is abnormal. Mild to moderate mitral annular calcification. Mild to moderate mitral valve regurgitation. Tricuspid Valve: The tricuspid valve is abnormal. Tricuspid valve regurgitation is moderate to severe. Aortic Valve: The aortic valve is tricuspid. There is moderate calcification of the aortic valve. Aortic valve regurgitation is trivial. Moderate to severe aortic stenosis is present. Aortic valve mean gradient measures 18.5 mmHg. Aortic valve peak gradient measures 29.5 mmHg. Aortic valve area, by VTI measures 0.61 cm. Pulmonic Valve: The pulmonic valve was grossly normal. Pulmonic valve regurgitation is trivial. Aorta: The aortic root and ascending aorta are structurally normal, with no evidence of dilitation. Venous: The inferior vena cava was not well visualized. IAS/Shunts: No atrial level shunt detected by color  flow Doppler. Additional Comments: A  device lead is visualized.  LEFT VENTRICLE PLAX 2D LV EF:         Left            Diastology                ventricular     LV e' medial:    7.62 cm/s                ejection        LV E/e' medial:  12.7                fraction by     LV e' lateral:   10.80 cm/s                PLAX is 40      LV E/e' lateral: 9.0                %. LVIDd:         3.70 cm LVIDs:         3.00 cm LV PW:         1.30 cm LV IVS:        1.20 cm LVOT diam:     1.90 cm LV SV:         35 LV SV Index:   19 LVOT Area:     2.84 cm  RIGHT VENTRICLE RV S prime:     11.10 cm/s TAPSE (M-mode): 1.9 cm LEFT ATRIUM              Index         RIGHT ATRIUM           Index LA Vol (A2C):   199.0 ml 104.42 ml/m  RA Area:     50.70 cm LA Vol (A4C):   126.0 ml 66.11 ml/m   RA Volume:   249.00 ml 130.65 ml/m LA Biplane Vol: 167.0 ml 87.63 ml/m  AORTIC VALVE AV Area (Vmax):    0.68 cm AV Area (Vmean):   0.70 cm AV Area (VTI):     0.61 cm AV Vmax:           271.50 cm/s AV Vmean:          200.500 cm/s AV VTI:            0.578 m AV Peak Grad:      29.5 mmHg AV Mean Grad:      18.5 mmHg LVOT Vmax:         64.70 cm/s LVOT Vmean:        49.750 cm/s LVOT VTI:          0.125 m LVOT/AV VTI ratio: 0.22  AORTA Ao Root diam: 3.20 cm MITRAL VALVE               TRICUSPID VALVE MV Area (PHT): 2.90 cm    TR Peak grad:   40.2 mmHg MV Decel Time: 262 msec    TR Vmax:        317.00 cm/s MV E velocity: 96.90 cm/s                            SHUNTS                            Systemic VTI:  0.12 m  Systemic Diam: 1.90 cm Zoila Shutter MD Electronically signed by Zoila Shutter MD Signature Date/Time: 08/14/2023/4:02:23 PM    Final    DG Tibia/Fibula Right  Result Date: 08/14/2023 CLINICAL DATA:  Fall. EXAM: RIGHT TIBIA AND FIBULA - 2 VIEW COMPARISON:  Right ankle radiographs dated December 12, 2016. CT of the right knee dated March 13, 2013. FINDINGS: Status post right total knee arthroplasty in appropriate  alignment. The femoral and tibial components appear well seated. No periprosthetic lucency. No acute fracture or dislocation. Vascular calcifications are noted. No significant focal soft tissue swelling. IMPRESSION: No acute osseous abnormality.  Intact right total knee arthroplasty. Electronically Signed   By: Hart Robinsons M.D.   On: 08/14/2023 10:27   CT HEAD WO CONTRAST  Result Date: 08/13/2023 CLINICAL DATA:  Head trauma, moderate-severe; Polytrauma, blunt EXAM: CT HEAD WITHOUT CONTRAST CT CERVICAL SPINE WITHOUT CONTRAST TECHNIQUE: Multidetector CT imaging of the head and cervical spine was performed following the standard protocol without intravenous contrast. Multiplanar CT image reconstructions of the cervical spine were also generated. RADIATION DOSE REDUCTION: This exam was performed according to the departmental dose-optimization program which includes automated exposure control, adjustment of the mA and/or kV according to patient size and/or use of iterative reconstruction technique. COMPARISON:  Chest x-ray 08/13/2023 FINDINGS: CT HEAD FINDINGS Brain: Cerebral ventricle sizes are concordant with the degree of cerebral volume loss. Patchy and confluent areas of decreased attenuation are noted throughout the deep and periventricular white matter of the cerebral hemispheres bilaterally, compatible with chronic microvascular ischemic disease. No evidence of large-territorial acute infarction. No parenchymal hemorrhage. No mass lesion. No extra-axial collection. No mass effect or midline shift. No hydrocephalus. Basilar cisterns are patent. Vascular: No hyperdense vessel. Atherosclerotic calcifications are present within the cavernous internal carotid and vertebral arteries. Skull: No acute fracture or focal lesion. Sinuses/Orbits: Paranasal sinuses and mastoid air cells are clear. Bilateral lens replacement. Otherwise the orbits are unremarkable. Other: 7 mm right scalp hematoma. CT CERVICAL SPINE  FINDINGS Alignment: Normal. Skull base and vertebrae: Multilevel moderate degenerative change of the spine. Associated moderate to severe bilateral C3-C4 C5-C6 osseous neural foraminal stenosis. No severe osseous central canal stenosis. No acute fracture. No aggressive appearing focal osseous lesion or focal pathologic process. Soft tissues and spinal canal: No prevertebral fluid or swelling. No visible canal hematoma. Upper chest: Small volume right pleural effusion. Other: Atherosclerotic plaque of the carotid arteries within the neck. IMPRESSION: 1. No acute intracranial abnormality. 2. No acute displaced fracture or traumatic listhesis of the cervical spine. 3. Small volume right pleural effusion. Electronically Signed   By: Tish Frederickson M.D.   On: 08/13/2023 17:28   CT CERVICAL SPINE WO CONTRAST  Result Date: 08/13/2023 CLINICAL DATA:  Head trauma, moderate-severe; Polytrauma, blunt EXAM: CT HEAD WITHOUT CONTRAST CT CERVICAL SPINE WITHOUT CONTRAST TECHNIQUE: Multidetector CT imaging of the head and cervical spine was performed following the standard protocol without intravenous contrast. Multiplanar CT image reconstructions of the cervical spine were also generated. RADIATION DOSE REDUCTION: This exam was performed according to the departmental dose-optimization program which includes automated exposure control, adjustment of the mA and/or kV according to patient size and/or use of iterative reconstruction technique. COMPARISON:  Chest x-ray 08/13/2023 FINDINGS: CT HEAD FINDINGS Brain: Cerebral ventricle sizes are concordant with the degree of cerebral volume loss. Patchy and confluent areas of decreased attenuation are noted throughout the deep and periventricular white matter of the cerebral hemispheres bilaterally, compatible with chronic microvascular ischemic disease. No evidence of large-territorial  acute infarction. No parenchymal hemorrhage. No mass lesion. No extra-axial collection. No mass  effect or midline shift. No hydrocephalus. Basilar cisterns are patent. Vascular: No hyperdense vessel. Atherosclerotic calcifications are present within the cavernous internal carotid and vertebral arteries. Skull: No acute fracture or focal lesion. Sinuses/Orbits: Paranasal sinuses and mastoid air cells are clear. Bilateral lens replacement. Otherwise the orbits are unremarkable. Other: 7 mm right scalp hematoma. CT CERVICAL SPINE FINDINGS Alignment: Normal. Skull base and vertebrae: Multilevel moderate degenerative change of the spine. Associated moderate to severe bilateral C3-C4 C5-C6 osseous neural foraminal stenosis. No severe osseous central canal stenosis. No acute fracture. No aggressive appearing focal osseous lesion or focal pathologic process. Soft tissues and spinal canal: No prevertebral fluid or swelling. No visible canal hematoma. Upper chest: Small volume right pleural effusion. Other: Atherosclerotic plaque of the carotid arteries within the neck. IMPRESSION: 1. No acute intracranial abnormality. 2. No acute displaced fracture or traumatic listhesis of the cervical spine. 3. Small volume right pleural effusion. Electronically Signed   By: Tish Frederickson M.D.   On: 08/13/2023 17:28   DG Pelvis Portable  Result Date: 08/13/2023 CLINICAL DATA:  Pain after trauma EXAM: PORTABLE PELVIS 1 VIEWS COMPARISON:  None Available. FINDINGS: Hyperostosis. Slight joint space loss of the right hip compared to left. Mild degenerative changes of the sacroiliac joints. Hypertrophic changes of the pubic symphysis. No fracture or dislocation. Prominent degenerative changes along the lumbar spine. Vascular calcifications. Osteopenia. With this level of osteopenia subtle nondisplaced injury is difficult to completely exclude and if needed additional cross-sectional imaging as clinically directed such as CT for further sensitivity. IMPRESSION: Mild degenerative changes.  Hyperostosis.  Osteopenia Electronically  Signed   By: Karen Kays M.D.   On: 08/13/2023 17:25   DG Chest Port 1 View  Result Date: 08/13/2023 CLINICAL DATA:  Pain after trauma.  Larey Seat out of a car. EXAM: PORTABLE CHEST 1 VIEW COMPARISON:  X-ray 08/01/2023.  Older exams as well FINDINGS: Enlarged cardiopericardial silhouette with calcified aorta. Left upper chest pacemaker. Small right effusion. Linear opacity at the bases. No pneumothorax. Developing interstitial changes. If there is further concern of the sequela of trauma, contrast CT could be considered as clinically appropriate for further sensitivity of injury IMPRESSION: Enlarged heart. Pacemaker. New bilateral interstitial changes. Recommend follow up Small right pleural effusion.  Basilar opacities. Electronically Signed   By: Karen Kays M.D.   On: 08/13/2023 17:24           LOS: 2 days   Time spent= 35 mins    Miguel Rota, MD [facility]  If 7PM-7AM, please contact night-coverage  08/15/2023, 2:07 PM

## 2023-08-15 NOTE — Discharge Instructions (Signed)
Atrial Fibrillation  Atrial fibrillation is a type of heartbeat that is irregular or fast. If you have this condition, your heart beats without any order. This makes it hard for your heart to pump blood in a normal way. Atrial fibrillation may come and go, or it may become a long-lasting problem. If this condition is not treated, it can put you at higher risk for stroke, heart failure, and other heart problems. What are the causes? This condition may be caused by diseases that damage the heart. They include:  High blood pressure.  Heart failure.  Heart valve disease.  Heart surgery. Other causes include:  Diabetes.  Thyroid disease.  Being overweight.  Kidney disease. Sometimes the cause is not known. What increases the risk? You are more likely to develop this condition if:  You are older.  You smoke.  You exercise often and very hard.  You have a family history of this condition.  You are a man.  You use drugs.  You drink a lot of alcohol.  You have lung conditions, such as emphysema, pneumonia, or COPD.  You have sleep apnea. What are the signs or symptoms? Common symptoms of this condition include:  A feeling that your heart is beating very fast.  Chest pain or discomfort.  Feeling short of breath.  Suddenly feeling light-headed or weak.  Getting tired easily during activity.  Fainting.  Sweating. In some cases, there are no symptoms. How is this treated? Treatment for this condition depends on underlying conditions and how you feel when you have atrial fibrillation. They include:  Medicines to: ? Prevent blood clots. ? Treat heart rate or heart rhythm problems.  Using devices, such as a pacemaker, to correct heart rhythm problems.  Doing surgery to remove the part of the heart that sends bad signals.  Closing an area where clots can form in the heart (left atrial appendage). In some cases, your doctor will treat other underlying  conditions. Follow these instructions at home: Medicines  Take over-the-counter and prescription medicines only as told by your doctor.  Do not take any new medicines without first talking to your doctor.  If you are taking blood thinners: ? Talk with your doctor before you take any medicines that have aspirin or NSAIDs, such as ibuprofen, in them. ? Take your medicine exactly as told by your doctor. Take it at the same time each day. ? Avoid activities that could hurt or bruise you. Follow instructions about how to prevent falls. ? Wear a bracelet that says you are taking blood thinners. Or, carry a card that lists what medicines you take. Lifestyle      Do not use any products that have nicotine or tobacco in them. These include cigarettes, e-cigarettes, and chewing tobacco. If you need help quitting, ask your doctor.  Eat heart-healthy foods. Talk with your doctor about the right eating plan for you.  Exercise regularly as told by your doctor.  Do not drink alcohol.  Lose weight if you are overweight.  Do not use drugs, including cannabis. General instructions  If you have a condition that causes breathing to stop for a short period of time (apnea), treat it as told by your doctor.  Keep a healthy weight. Do not use diet pills unless your doctor says they are safe for you. Diet pills may make heart problems worse.  Keep all follow-up visits as told by your doctor. This is important. Contact a doctor if:  You notice a  change in the speed, rhythm, or strength of your heartbeat.  You are taking a blood-thinning medicine and you get more bruising.  You get tired more easily when you move or exercise.  You have a sudden change in weight. Get help right away if:   You have pain in your chest or your belly (abdomen).  You have trouble breathing.  You have side effects of blood thinners, such as blood in your vomit, poop (stool), or pee (urine), or bleeding that cannot  stop.  You have any signs of a stroke. "BE FAST" is an easy way to remember the main warning signs: ? B - Balance. Signs are dizziness, sudden trouble walking, or loss of balance. ? E - Eyes. Signs are trouble seeing or a change in how you see. ? F - Face. Signs are sudden weakness or loss of feeling in the face, or the face or eyelid drooping on one side. ? A - Arms. Signs are weakness or loss of feeling in an arm. This happens suddenly and usually on one side of the body. ? S - Speech. Signs are sudden trouble speaking, slurred speech, or trouble understanding what people say. ? T - Time. Time to call emergency services. Write down what time symptoms started.  You have other signs of a stroke, such as: ? A sudden, very bad headache with no known cause. ? Feeling like you may vomit (nausea). ? Vomiting. ? A seizure. These symptoms may be an emergency. Do not wait to see if the symptoms will go away. Get medical help right away. Call your local emergency services (911 in the [place] Do not drive yourself to the hospital. Summary  Atrial fibrillation is a type of heartbeat that is irregular or fast.  You are at higher risk of this condition if you smoke, are older, have diabetes, or are overweight.  Follow your doctor's instructions about medicines, diet, exercise, and follow-up visits.  Get help right away if you have signs or symptoms of a stroke.  Get help right away if you cannot catch your breath, or you have chest pain or discomfort. This information is not intended to replace advice given to you by your health care provider. Make sure you discuss any questions you have with your health care provider. Document Revised: 02/20/2019 Document Reviewed: 02/20/2019 Elsevier Patient Education  2020 [employer] Inc. Information on my medicine - ELIQUIS (apixaban)  This medication education was reviewed with me or my healthcare representative as part of my discharge preparation.   Why was  Eliquis prescribed for you? Eliquis was prescribed for you to reduce the risk of a blood clot forming that can cause a stroke if you have a medical condition called atrial fibrillation (a type of irregular heartbeat).  What do You need to know about Eliquis ? Take your Eliquis TWICE DAILY - one tablet in the morning and one tablet in the evening with or without food. If you have difficulty swallowing the tablet whole please discuss with your pharmacist how to take the medication safely.  Take Eliquis exactly as prescribed by your doctor and DO NOT stop taking Eliquis without talking to the doctor who prescribed the medication.  Stopping may increase your risk of developing a stroke.  Refill your prescription before you run out.  After discharge, you should have regular check-up appointments with your healthcare provider that is prescribing your Eliquis.  In the future your dose may need to be changed if your kidney function or   weight changes by a significant amount or as you get older.  What do you do if you miss a dose? If you miss a dose, take it as soon as you remember on the same day and resume taking twice daily.  Do not take more than one dose of ELIQUIS at the same time to make up a missed dose.  Important Safety Information A possible side effect of Eliquis is bleeding. You should call your healthcare provider right away if you experience any of the following: ? Bleeding from an injury or your nose that does not stop. ? Unusual colored urine (red or dark brown) or unusual colored stools (red or black). ? Unusual bruising for unknown reasons. ? A serious fall or if you hit your head (even if there is no bleeding).  Some medicines may interact with Eliquis and might increase your risk of bleeding or clotting while on Eliquis. To help avoid this, consult your healthcare provider or pharmacist prior to using any new prescription or non-prescription medications, including herbals,  vitamins, non-steroidal anti-inflammatory drugs (NSAIDs) and supplements.  This website has more information on Eliquis (apixaban): http://www.eliquis.com/eliquis/home  

## 2023-08-15 NOTE — Progress Notes (Signed)
PHARMACY - ANTICOAGULATION CONSULT NOTE  Pharmacy Consult for Warfarin >> apixaban Indication: atrial fibrillation  Allergies  Allergen Reactions   Tramadol Itching    Patient Measurements: Height: 5\' 4"  (162.6 cm) Weight: 87.3 kg (192 lb 7.4 oz) IBW/kg (Calculated) : 59.2  Vital Signs: Temp: 97.9 F (36.6 C) (12/03 0339) Temp Source: Oral (12/03 0339) BP: 121/69 (12/03 0339) Pulse Rate: 62 (12/03 0339)  Labs: Recent Labs    08/13/23 1644 08/13/23 1653 08/14/23 0505 08/15/23 0303  HGB 10.9* 12.6* 10.4*  --   HCT 37.2* 37.0* 34.8*  --   PLT 131*  --  125*  --   LABPROT 25.2*  --  27.0* 33.8*  INR 2.3*  --  2.5* 3.3*  CREATININE 1.58* 1.60* 1.38* 1.71*    Estimated Creatinine Clearance: 28.6 mL/min (A) (by C-G formula based on SCr of 1.71 mg/dL (H)).   Medical History: Past Medical History:  Diagnosis Date   A-fib [facility])    Arthritis    CAD (coronary artery disease)    GXT, neg bruce protocol GXT   Chronic kidney disease    RENAL INSUFFICIENCY   Diverticulosis    ED (erectile dysfunction)    GERD (gastroesophageal reflux disease)    Glaucoma    Gout    no flare in years    Heart disorder    Hypertension    Obesity    Squamous cell carcinoma in situ     Assessment: 89 y/o M on warfarin PTA for afib, presents to the ED s/p fall, CT head negative, continuing warfarin  PTA dosing: 2.5mg  daily except 1.25mg  Sunday and Wed At anti-coag appointment on 11/18, patient's INR was elevated to 4.4.   INR of 3.3 is supratherapeutic this morning. Hgb dropped from 12.6 to 10.4 since yesterday. Plts (125) are low stable. Per RN, no signs of bleeding. Patient chart shows him eating 100% of meals, no new DDIs noted.  Copay check completed for Eliquis and patient is agreeable. Spoke with Hospitalist, Cardiology, and EP who agree with transition to Eliquis. Will plan to transition to Eliquis 2.5 mg twice daily (dose reduced d/t age, borderline renal function, and  admission for fall) once INR is < 2.  Goal of Therapy:  INR 2-3 Monitor platelets by anticoagulation protocol: Yes   Plan:  Hold warfarin, monitor daily INR until < 2 then start Eliquis 2.5mg  bid Monitor for s/s bleeding   Ernestene Kiel, PharmD PGY1 Pharmacy Resident  Please check AMION for all [facility] phone numbers After 10:00 PM, call Main Pharmacy [phone] 08/15/2023 8:01 AM

## 2023-08-15 NOTE — Telephone Encounter (Signed)
Patient Product/process development scientist completed.    The patient is insured through [facility]. Patient has Medicare and is not eligible for a copay card, but may be able to apply for patient assistance, if available.    Ran test claim for Eliquis 2.5 mg and the current 30 day co-pay is $47.00.  Ran test claim for Xarelto 15 mg and the current 30 day co-pay is $47.00.  This test claim was processed through [facility]- copay amounts may vary at other pharmacies due to pharmacy/plan contracts, or as the patient moves through the different stages of their insurance plan.     Roland Earl, CPHT Pharmacy Technician III Certified Patient Advocate [facility] Patient Advocate Team Direct Number: [phone]  Fax: [phone]

## 2023-08-15 NOTE — Plan of Care (Signed)

## 2023-08-15 NOTE — Consult Note (Signed)
[facility] Liaison Consult Note    08/15/2023  [name] 03-15-1931 [id number]  Insurance: [employer] Medicare   Primary Care Provider: Ronnald Nian, MD with [facility], this provider is listed for the transition of care follow up appointments  and [facility] calls   [facility] Liaison reviewed for post hospital follow up needs.  Patient is from home with daughter.  11:20 am Physician in room speaking with patient and family.  The patient was screened for Fluid overload day readmission hospitalization with noted  risk score for unplanned readmission risk 1  hospital admissions in 6 months.  The patient was assessed for potential Community Care Coordination service needs for post hospital transition for care coordination. Review of patient's electronic medical record reveals patient is for likely home with [facility].   Plan: [facility] Liaison will continue to follow progress and disposition to asess for post hospital community care coordination/management needs.  Referral request for community care coordination: No additional needs at this time as discussed with unit [facility] RN on rounds.   [facility] Community Care, Population Health does not replace or interfere with any arrangements made by the Inpatient Transition of Care team.   For questions contact:   Charlesetta Shanks, RN, BSN, CCM [place]  [facility] Liaison Direct Dial: [phone] or secure chat Email: [email or web address]

## 2023-08-15 NOTE — Progress Notes (Signed)
Mobility Specialist Progress Note:    08/15/23 1231  Mobility  Activity Transferred from bed to chair  Level of Assistance Minimal assist, patient does 75% or more  Assistive Device Other (Comment) (HHA)  Distance Ambulated (ft) 5 ft  Activity Response Tolerated well  Mobility Referral Yes  $Mobility charge 1 Mobility  Mobility Specialist Start Time (ACUTE ONLY) 1140  Mobility Specialist Stop Time (ACUTE ONLY) 1153  Mobility Specialist Time Calculation (min) (ACUTE ONLY) 13 min   Pt received in bed agreeable to mobility. Wanted to ambulate in the hallways but d/t no RW in room we were able to transfer to the chair w/ HHA. No c/o throughout. Transferred on 2L/min, VSS. Call bell and personal belongings in reach. All needs met.chair alarm on.   Thompson Grayer Mobility Specialist  Please contact vis Secure Chat or  Rehab Office [phone]

## 2023-08-15 NOTE — Hospital Course (Addendum)
Brief Narrative:  89 y/o male w/ PMHx of CAD, chronic thrombocytopenia, HLD, HTN, permanent A-fib, chronic anticoagulation, sp PPM, history of gout. She follows with [facility] cardiology. At baseline patient weak, uses a walker for ambulation.  On the day of admission had a fall which was witnessed without loss of consciousness or evidence of seizure.  Upon admission noted to have some evidence of volume overload.  Hospital course course complicated by likely overdiuresis causing AKI requiring brief course of IV fluid.  In the meantime encourage oral hydration.  Initially PT recommended CIR but patient continued to decline with lack of desire to eat drink and participate in therapy.  Now transition to comfort care.   Assessment & Plan:  Principal Problem:   Fluid overload Active Problems:   Hyperlipidemia LDL goal <70   Primary hypertension   Long term (current) use of anticoagulants   CAD (coronary artery disease)   Permanent atrial fibrillation (HCC)   Fall at home, initial encounter     Acute congestive heart failure with reduced EF, 40% --Patient was initially diuresed but thereafter went into mild AKI.  Getting gentle hydration as necessary but for now we will stop all the further intervention as family is interested in hospice.  AKI; Baseline 1.3 -Despite of IV fluids, his p.o. intake remains very poor.  Creatinine remains around 1.8.  Family no longer wishing to pursue further IV fluids and further lab work which I agree with.  Dysphagia - MBS performed, shows chronic dysphagia.  Will place him on regular diet without any restrictions   Chronic lower extremity skin changes secondary to venous stasis. Supportive care   Intertrigo in moist folds/groin -Nystatin cream   Essential hypertension Discontinue Coreg   Physical deconditioning with fall Right knee pain -Trauma workup is negative.  PT/OT-home health -Right knee x-ray negative for any acute pathology.  Intact  arthroplasty     CAD (coronary artery disease), no chest pain Discontinue Coreg and statin    Thrombocytopenia - chronic -Baseline stable   Hyperlipidemia LDL goal <70 -Simvastatin discontinued   Permanent atrial fibrillation (HCC) History of symptomatic bradycardia status post pacemaker Chronic anticoagulation Discontinue Coumadin and Coreg   Gout -Resume allopurinol   BPH - home flomax and finasteride  At this time family is interested in hospice.  Will consult hospice liaison.  I agree that patient overall now has very poor prognosis especially his advanced age, lack of desire to eat and drink and participate in therapy.    DVT prophylaxis: None Code Status: dnr Family Communication: Family at bedside  Ongoing hospice evaluation   Subjective: Seen and examined at bedside.  Family is also present at bedside Patient is extremely tired and most the day remains lethargic.  Very poor oral intake.  Is also had very poor breathing effort.  He has expressed desire to family that he does not further wish to pursue any aggressive care and that he is ready to transition. Family interested in discussing hospice.  We also discussed to hold off on any further lab draws, telemetry monitoring, any escalation of care and discontinuing unnecessary medications.  Examination:  General exam: Lethargic, elderly, frail on 4 L nasal cannula  Respiratory system: Mild bibasilar crackles Cardiovascular system: S1 & S2 heard, RRR. No JVD, murmurs, rubs, gallops or clicks. No pedal edema. Gastrointestinal system: Abdomen is nondistended, soft and nontender. No organomegaly or masses felt. Normal bowel sounds heard. Central nervous system: Alert and oriented. No focal neurological deficits. Extremities: Symmetric 5  x 5 power. Skin: chronic b/l le skin changes.  Psychiatry: Judgement and insight appear poor

## 2023-08-16 DIAGNOSIS — E8779 Other fluid overload: Secondary | ICD-10-CM | POA: Diagnosis not present

## 2023-08-16 LAB — CBC
HCT: 32.4 % — ABNORMAL LOW (ref 39.0–52.0)
Hemoglobin: 9.7 g/dL — ABNORMAL LOW (ref 13.0–17.0)
MCH: 29.7 pg (ref 26.0–34.0)
MCHC: 29.9 g/dL — ABNORMAL LOW (ref 30.0–36.0)
MCV: 99.1 fL (ref 80.0–100.0)
Platelets: 113 10*3/uL — ABNORMAL LOW (ref 150–400)
RBC: 3.27 MIL/uL — ABNORMAL LOW (ref 4.22–5.81)
RDW: 16.6 % — ABNORMAL HIGH (ref 11.5–15.5)
WBC: 6.1 10*3/uL (ref 4.0–10.5)
nRBC: 0 % (ref 0.0–0.2)

## 2023-08-16 LAB — BASIC METABOLIC PANEL
Anion gap: 6 (ref 5–15)
Anion gap: 6 (ref 5–15)
BUN: 28 mg/dL — ABNORMAL HIGH (ref 8–23)
BUN: 28 mg/dL — ABNORMAL HIGH (ref 8–23)
CO2: 29 mmol/L (ref 22–32)
CO2: 30 mmol/L (ref 22–32)
Calcium: 8.5 mg/dL — ABNORMAL LOW (ref 8.9–10.3)
Calcium: 8.4 mg/dL — ABNORMAL LOW (ref 8.9–10.3)
Chloride: 104 mmol/L (ref 98–111)
Chloride: 102 mmol/L (ref 98–111)
Creatinine, Ser: 1.87 mg/dL — ABNORMAL HIGH (ref 0.61–1.24)
Creatinine, Ser: 1.88 mg/dL — ABNORMAL HIGH (ref 0.61–1.24)
GFR, Estimated: 34 mL/min — ABNORMAL LOW (ref >60)
GFR, Estimated: 34 mL/min — ABNORMAL LOW (ref >60)
Glucose, Bld: 119 mg/dL — ABNORMAL HIGH (ref 70–99)
Glucose, Bld: 104 mg/dL — ABNORMAL HIGH (ref 70–99)
Potassium: 4.2 mmol/L (ref 3.5–5.1)
Potassium: 4.1 mmol/L (ref 3.5–5.1)
Sodium: 139 mmol/L (ref 135–145)
Sodium: 138 mmol/L (ref 135–145)

## 2023-08-16 LAB — PROTIME-INR
INR: 2.5 — ABNORMAL HIGH (ref 0.8–1.2)
Prothrombin Time: 27.5 s — ABNORMAL HIGH (ref 11.4–15.2)

## 2023-08-16 LAB — MAGNESIUM: Magnesium: 1.8 mg/dL (ref 1.7–2.4)

## 2023-08-16 LAB — PHOSPHORUS: Phosphorus: 4 mg/dL (ref 2.5–4.6)

## 2023-08-16 MED ORDER — SODIUM CHLORIDE 0.9 % IV BOLUS
500.0000 mL | Freq: Once | INTRAVENOUS | Status: AC
  Administered 2023-08-16: 500 mL via INTRAVENOUS

## 2023-08-16 MED ORDER — OLANZAPINE 10 MG IM SOLR: 5.0000 mg | Freq: Four times a day (QID) | INTRAMUSCULAR | Status: DC | PRN

## 2023-08-16 MED ORDER — SODIUM CHLORIDE 0.9 % IV SOLN: INTRAVENOUS | Status: AC

## 2023-08-16 NOTE — Progress Notes (Signed)
Inpatient Rehab Admissions Coordinator Note:   Per updated PT recommendations patient was screened for CIR candidacy by Stephania Fragmin, PT. At this time, pt appears to be a potential candidate for CIR. I will place an order for rehab consult for full assessment, per our protocol.  Please contact me any with questions.Estill Dooms, PT, DPT [phone] 08/16/23 11:13 AM

## 2023-08-16 NOTE — Progress Notes (Signed)
Inpatient Rehab Coordinator Note:  I met with patient at bedside to discuss CIR recommendations and goals/expectations of CIR stay.  We reviewed 3 hrs/day of therapy, physician follow up, and average length of stay 2 weeks (dependent upon progress) with goals of supervision to mod I.  We reviewed need for insurance prior auth and I did share with him that it may be a tough case to win with [facility] Medicare.  He will discuss with his daughter and let me know how they decide to proceed.   Estill Dooms, PT, DPT Admissions Coordinator [phone] 08/16/23  1:11 PM

## 2023-08-16 NOTE — Progress Notes (Signed)
PROGRESS NOTE    [place].  ZOX:[id number] DOB: Mar 22, 1931 DOA: 08/13/2023 PCP: Ronnald Nian, MD    Brief Narrative:  89 y/o male w/ PMHx of CAD, chronic thrombocytopenia, HLD, HTN, permanent A-fib, chronic anticoagulation, sp PPM, history of gout. She follows with [facility] cardiology. At baseline patient weak, uses a walker for ambulation.  On the day of admission had a fall which was witnessed without loss of consciousness or evidence of seizure.  Upon admission noted to have some evidence of volume overload.  Eventually PT recommended CIR therefore inpatient rehab team consulted   Assessment & Plan:  Principal Problem:   Fluid overload Active Problems:   Hyperlipidemia LDL goal <70   Primary hypertension   Long term (current) use of anticoagulants   CAD (coronary artery disease)   Permanent atrial fibrillation (HCC)   Fall at home, initial encounter     Acute congestive heart failure with reduced EF, 40% -Now appears more euvolemic. Will rising Cr, will stop Lasix. Echo shows reduced EF with global hypokinesia.  EF is 40%. -Continue Coreg, statin. -Cardiology outpatient follow up  AKI; Baseline 1.3 -Cr today 1.8. Holding lasix, NS 500cc NS bolus.    Chronic lower extremity skin changes secondary to venous stasis. -Picture in the media tab reviewed.  No clear evidence of infection.  Hold off on antibiotics   Intertrigo in moist folds/groin -Nystatin cream   Essential hypertension -Continue Coreg.   IV as needed   Physical deconditioning with fall Right knee pain -Trauma workup is negative.  PT/OT-home health -Right knee x-ray negative for any acute pathology.  Intact arthroplasty     CAD (coronary artery disease), no chest pain -Coreg, simvastatin, Coreg resumed    Thrombocytopenia - chronic -Baseline stable   Hyperlipidemia LDL goal <70 -Simvastatin resumed   Permanent atrial fibrillation (HCC) History of symptomatic bradycardia status post  pacemaker Chronic anticoagulation -Coumadin resumed, pharmacy to dose.  Continue Coreg   Gout -Resume allopurinol   BPH - home flomax and finasteride   DVT prophylaxis: home warfarin Code Status: dnr Family Communication: Daughter at bedside  Remains inpatient appropriate because: Hopefully dc home tomororw if Cr stable   Subjective: Seen and examined at bedside.  Daughter at bedside as well.  Patient continues to feel weak while seen in the hospital.  Him and daughter hopeful that patient will improve with some aggressive therapy which I agree with.   Examination:  General exam: Appears calm and comfortable; elderly frail  Respiratory system: Clear to auscultation. Respiratory effort normal. Cardiovascular system: S1 & S2 heard, RRR. No JVD, murmurs, rubs, gallops or clicks. No pedal edema. Gastrointestinal system: Abdomen is nondistended, soft and nontender. No organomegaly or masses felt. Normal bowel sounds heard. Central nervous system: Alert and oriented. No focal neurological deficits. Extremities: Symmetric 5 x 5 power. Skin: chronic b/l le skin changes.  Psychiatry: Judgement and insight appear normal. Mood & affect appropriate.                Diet Orders (From admission, onward)     Start     Ordered   08/13/23 2130  Diet Heart Room service appropriate? Yes; Fluid consistency: Thin  Diet effective now       Question Answer Comment  Room service appropriate? Yes   Fluid consistency: Thin      08/13/23 2131            Objective: Vitals:   08/16/23 0043 08/16/23 0438 08/16/23 0717 08/16/23  1147  BP: 116/60 121/79 111/66 119/67  Pulse: 63 78 62 73  Resp: 15 20 20 17   Temp: 98 F (36.7 C) 99.1 F (37.3 C) 98.1 F (36.7 C) 98 F (36.7 C)  TempSrc: Oral Oral Oral Oral  SpO2: 95% 92% 91% 95%  Weight:  85.4 kg    Height:        Intake/Output Summary (Last 24 hours) at 08/16/2023 1238 Last data filed at 08/16/2023 0800 Gross per 24 hour   Intake 240 ml  Output 940 ml  Net -700 ml   Filed Weights   08/13/23 1628 08/15/23 0006 08/16/23 0438  Weight: 85.3 kg 87.3 kg 85.4 kg    Scheduled Meds:  allopurinol  100 mg Oral Daily   carvedilol  3.125 mg Oral BID WC   finasteride  5 mg Oral Daily   latanoprost  1 drop Both Eyes QHS   nystatin cream   Topical BID   simvastatin  20 mg Oral QHS   sodium chloride flush  3 mL Intravenous Q12H   tamsulosin  0.8 mg Oral Daily   Continuous Infusions:  Nutritional status     Body mass index is 32.32 kg/m.  Data Reviewed:   CBC: Recent Labs  Lab 08/13/23 1644 08/13/23 1653 08/14/23 0505 08/16/23 0303  WBC 6.6  --  7.1 6.1  NEUTROABS  --   --  5.0  --   HGB 10.9* 12.6* 10.4* 9.7*  HCT 37.2* 37.0* 34.8* 32.4*  MCV 99.7  --  98.0 99.1  PLT 131*  --  125* 113*   Basic Metabolic Panel: Recent Labs  Lab 08/13/23 1644 08/13/23 1653 08/14/23 0505 08/15/23 0303 08/16/23 0303 08/16/23 1008  NA 142 142 144 142 139 138  K 4.3 4.2 4.4 4.3 4.2 4.1  CL 110 108 106 107 104 102  CO2 24  --  28 29 29 30   GLUCOSE 118* 114* 91 103* 119* 104*  BUN 26* 27* 25* 25* 28* 28*  CREATININE 1.58* 1.60* 1.38* 1.71* 1.87* 1.88*  CALCIUM 9.2  --  9.2 8.9 8.5* 8.4*  MG  --   --  1.7 1.8 1.8  --   PHOS  --   --   --   --  4.0  --    GFR: Estimated Creatinine Clearance: 25.7 mL/min (A) (by C-G formula based on SCr of 1.88 mg/dL (H)). Liver Function Tests: Recent Labs  Lab 08/13/23 1644  AST 27  ALT 12  ALKPHOS 86  BILITOT 1.1  PROT 6.0*  ALBUMIN 3.2*   No results for input(s): "LIPASE", "AMYLASE" in the last 168 hours. No results for input(s): "AMMONIA" in the last 168 hours. Coagulation Profile: Recent Labs  Lab 08/13/23 1644 08/14/23 0505 08/15/23 0303 08/16/23 0303  INR 2.3* 2.5* 3.3* 2.5*   Cardiac Enzymes: No results for input(s): "CKTOTAL", "CKMB", "CKMBINDEX", "TROPONINI" in the last 168 hours. BNP (last 3 results) No results for input(s): "PROBNP" in the  last 8760 hours. HbA1C: No results for input(s): "HGBA1C" in the last 72 hours. CBG: No results for input(s): "GLUCAP" in the last 168 hours. Lipid Profile: No results for input(s): "CHOL", "HDL", "LDLCALC", "TRIG", "CHOLHDL", "LDLDIRECT" in the last 72 hours. Thyroid Function Tests: No results for input(s): "TSH", "T4TOTAL", "FREET4", "T3FREE", "THYROIDAB" in the last 72 hours. Anemia Panel: No results for input(s): "VITAMINB12", "FOLATE", "FERRITIN", "TIBC", "IRON", "RETICCTPCT" in the last 72 hours. Sepsis Labs: Recent Labs  Lab 08/13/23 1654  LATICACIDVEN 1.6  No results found for this or any previous visit (from the past 240 hour(s)).       Radiology Studies: ECHOCARDIOGRAM COMPLETE  Result Date: 08/14/2023    ECHOCARDIOGRAM REPORT   Patient Name:   Linnell Luedeman. Date of Exam: 08/14/2023 Medical Rec #:  [id number]          Height:       64.0 in Accession #:    [id number]         Weight:       188.1 lb Date of Birth:  1931/06/10          BSA:          1.906 m Patient Age:    89 years           BP:           112/69 mmHg Patient Gender: M                  HR:           80 bpm. Exam Location:  Inpatient Procedure: 2D Echo, Color Doppler and Cardiac Doppler Indications:    CHF-Acute Diastolic I50.31  History:        Patient has prior history of Echocardiogram examinations, most                 recent 10/10/2019. CAD, Aortic Valve Disease; Arrythmias:Atrial                 Fibrillation.  Sonographer:    Darlys Gales Referring Phys: [phone] DEBBY CROSLEY IMPRESSIONS  1. Left ventricular ejection fraction, by estimation, is 40 to 45%. Left ventricular ejection fraction by PLAX is 40 %. The left ventricle has mildly decreased function. The left ventricle demonstrates global hypokinesis. There is mild left ventricular hypertrophy. Left ventricular diastolic function could not be evaluated.  2. Right ventricular systolic function is low normal. The right ventricular size is normal.  3. Left  atrial size was severely dilated.  4. Right atrial size was massively dilated.  5. The mitral valve is abnormal. Mild to moderate mitral valve regurgitation.  6. The tricuspid valve is abnormal. Tricuspid valve regurgitation is moderate to severe.  7. The aortic valve is tricuspid. There is moderate calcification of the aortic valve. Aortic valve regurgitation is trivial. Suspect low flow, low gradient moderate to severe aortic valve stenosis. Aortic valve area, by VTI measures 0.61 cm, based on an LVOT diameter of 1.9 cm. Aortic valve mean gradient measures 18.5 mmHg. Aortic valve Vmax measures 2.72 m/s. Peak gradient 29.5 mmHg, DI is 0.22. Comparison(s): Changes from prior study are noted. 10/10/2019: LVEF 50-55%. mild AS - mean gradient 14.8 mmHg. Conclusion(s)/Recommendation(s): Findings concerning for low flow, low gradient severe aortic stenosis. FINDINGS  Left Ventricle: Left ventricular ejection fraction, by estimation, is 40 to 45%. Left ventricular ejection fraction by PLAX is 40 %. The left ventricle has mildly decreased function. The left ventricle demonstrates global hypokinesis. The left ventricular internal cavity size was normal in size. There is mild left ventricular hypertrophy. Left ventricular diastolic function could not be evaluated due to atrial fibrillation. Left ventricular diastolic function could not be evaluated. Right Ventricle: The right ventricular size is normal. No increase in right ventricular wall thickness. Right ventricular systolic function is low normal. Left Atrium: Left atrial size was severely dilated. Right Atrium: Right atrial size was massively dilated. Pericardium: There is no evidence of pericardial effusion. Mitral Valve: The mitral valve is abnormal. Mild to  moderate mitral annular calcification. Mild to moderate mitral valve regurgitation. Tricuspid Valve: The tricuspid valve is abnormal. Tricuspid valve regurgitation is moderate to severe. Aortic Valve: The aortic  valve is tricuspid. There is moderate calcification of the aortic valve. Aortic valve regurgitation is trivial. Moderate to severe aortic stenosis is present. Aortic valve mean gradient measures 18.5 mmHg. Aortic valve peak gradient measures 29.5 mmHg. Aortic valve area, by VTI measures 0.61 cm. Pulmonic Valve: The pulmonic valve was grossly normal. Pulmonic valve regurgitation is trivial. Aorta: The aortic root and ascending aorta are structurally normal, with no evidence of dilitation. Venous: The inferior vena cava was not well visualized. IAS/Shunts: No atrial level shunt detected by color flow Doppler. Additional Comments: A device lead is visualized.  LEFT VENTRICLE PLAX 2D LV EF:         Left            Diastology                ventricular     LV e' medial:    7.62 cm/s                ejection        LV E/e' medial:  12.7                fraction by     LV e' lateral:   10.80 cm/s                PLAX is 40      LV E/e' lateral: 9.0                %. LVIDd:         3.70 cm LVIDs:         3.00 cm LV PW:         1.30 cm LV IVS:        1.20 cm LVOT diam:     1.90 cm LV SV:         35 LV SV Index:   19 LVOT Area:     2.84 cm  RIGHT VENTRICLE RV S prime:     11.10 cm/s TAPSE (M-mode): 1.9 cm LEFT ATRIUM              Index         RIGHT ATRIUM           Index LA Vol (A2C):   199.0 ml 104.42 ml/m  RA Area:     50.70 cm LA Vol (A4C):   126.0 ml 66.11 ml/m   RA Volume:   249.00 ml 130.65 ml/m LA Biplane Vol: 167.0 ml 87.63 ml/m  AORTIC VALVE AV Area (Vmax):    0.68 cm AV Area (Vmean):   0.70 cm AV Area (VTI):     0.61 cm AV Vmax:           271.50 cm/s AV Vmean:          200.500 cm/s AV VTI:            0.578 m AV Peak Grad:      29.5 mmHg AV Mean Grad:      18.5 mmHg LVOT Vmax:         64.70 cm/s LVOT Vmean:        49.750 cm/s LVOT VTI:          0.125 m LVOT/AV VTI ratio: 0.22  AORTA Ao Root diam: 3.20 cm MITRAL VALVE  TRICUSPID VALVE MV Area (PHT): 2.90 cm    TR Peak grad:   40.2 mmHg MV Decel  Time: 262 msec    TR Vmax:        317.00 cm/s MV E velocity: 96.90 cm/s                            SHUNTS                            Systemic VTI:  0.12 m                            Systemic Diam: 1.90 cm Zoila Shutter MD Electronically signed by Zoila Shutter MD Signature Date/Time: 08/14/2023/4:02:23 PM    Final            LOS: 3 days   Time spent= 35 mins    Miguel Rota, MD [facility]  If 7PM-7AM, please contact night-coverage  08/16/2023, 12:38 PM

## 2023-08-16 NOTE — Plan of Care (Signed)

## 2023-08-16 NOTE — Plan of Care (Signed)
  Problem: Activity: Goal: Risk for activity intolerance will decrease Outcome: Progressing   Problem: Coping: Goal: Level of anxiety will decrease Outcome: Progressing   Problem: Education: Goal: Knowledge of General Education information will improve Description: Including pain rating scale, medication(s)/side effects and non-pharmacologic comfort measures Outcome: Not Progressing Pt. Confused. Needs reorienting

## 2023-08-16 NOTE — Care Management Important Message (Signed)
Important Message  Patient Details  Name: [name]. MRN: [id number] Date of Birth: 1931/06/04   Important Message Given:  Yes - Medicare IM     Dorena Bodo 08/16/2023, 3:12 PM

## 2023-08-16 NOTE — Progress Notes (Signed)
Physical Therapy Treatment Patient Details Name: [name]. MRN: [id number] DOB: 03/05/1931 Today's Date: 08/16/2023   History of Present Illness 62 male who presents 08/13/23 after a witnessed fall getting out of his car. Pt sustained no LOC, CT head negative. Patient's daughter states that her daughter who is a nurse told her it looked like there was fluid on his lungs, being worked up at [facility] for fluid overload. PMH afib, artritis, CAD, CKD, Diverticulosis, Gout, Glaucoma, HTN.    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 This date, pt and daughter denying pt being blind in L eye and pt did not bump into any obstacles. He is at risk for falls though, demonstrating excess trunk flexion when ambulating. His trunk flexion and speed both increase as he fatigues as well, further impacting his safety. He required minA for transfers and bed mobility and min-modA to navigate x2 stairs today. He was unable to tolerate navigating more than x2 stairs at a time due to fatigue. At baseline, pt is mod I for transfers and gait with a RW and navigates at least x5 stairs to access his home with CGA-supervision. Pt's daughter is concerned about medically managing him at this time. He could greatly benefit from intensive inpatient rehab, > 3 hours/day, to address his medical condition and to improve his independence and safety with mobility. Thus, updated d/c recs to reflect this. Will continue to follow acutely.    If plan is discharge home, recommend the following: A little help with walking and/or transfers;A little help with bathing/dressing/bathroom;Assistance with cooking/housework;Assist for transportation;Help with stairs or ramp for entrance;Supervision due to cognitive status   Can travel by private vehicle        Equipment Recommendations  None recommended by PT    Recommendations for Other Services Rehab  consult     Precautions / Restrictions Precautions Precautions: Fall Restrictions Weight Bearing Restrictions: No     Mobility  Bed Mobility Overal bed mobility: Needs Assistance Bed Mobility: Supine to Sit     Supine to sit: Min assist, HOB elevated, Used rails     General bed mobility comments: Extra time and cues needed for pt to bring bil hands to L bed rail to pull to ascend trunk, extra time to manage each leg off L EOB, minA at trunk to sit up and scoot hips to EOB    Transfers Overall transfer level: Needs assistance Equipment used: Rolling walker (2 wheels) Transfers: Sit to/from Stand Sit to Stand: Min assist           General transfer comment: Cues provided to scoot to edge and for hand placement transferring sit <> stand, minA to power up to stand from low EOB 1x and from recliner 1x.    Ambulation/Gait Ambulation/Gait assistance: Contact guard assist, +2 safety/equipment Gait Distance (Feet): 75 Feet Assistive device: Rolling walker (2 wheels) Gait Pattern/deviations: Trunk flexed, Decreased stride length, Step-through pattern Gait velocity: Decreased Gait velocity interpretation: <1.8 ft/sec, indicate of risk for recurrent falls   General Gait Details: Pt ambulates without bumping into obstacles today but with a very flexed posture, needing repeated verbal and tactile cues to improve his upright posture. As distance progresses and pt appears to fatigue his posture worsens and his speed increases. No LOB, CGA for safety, chair follor for safety   Stairs Stairs: Yes Stairs assistance: Min assist, Mod assist Stair Management:  One rail Right, One rail Left, Step to pattern, Sideways Number of Stairs: 2 General stair comments: Ascends with bil hands on R rail, leading up with his R. Pt needed cues to bring his hands more anteriorly on the rails for a better pull up and anterior weight shift as he tends to lean back a little, minA for balance. Descends with  bil hands on L rail, needing modA to control descent and balance.   Wheelchair Mobility     Tilt Bed    Modified Rankin (Stroke Patients Only)       Balance Overall balance assessment: Needs assistance Sitting-balance support: Feet supported, No upper extremity supported Sitting balance-Leahy Scale: Fair     Standing balance support: During functional activity, Bilateral upper extremity supported Standing balance-Leahy Scale: Poor Standing balance comment: RW support                            Cognition Arousal: Alert Behavior During Therapy: WFL for tasks assessed/performed                                   General Comments: Per chart, pt has reported being blind in the L eye during PT Eval but today denies this and daughter reports pt is not blind. Did not bump into anything today but did need repeated cues for upright posture and for safety awareness as pt's posture worsens and speed increases as he fatigues when ambulating, placing him at risk for falls.        Exercises      General Comments General comments (skin integrity, edema, etc.): VSS on 3L O2; educated pt and daughter on use of IS and LAQs, and sit <> stands as exercises      Pertinent Vitals/Pain Pain Assessment Pain Assessment: Faces Faces Pain Scale: Hurts little more Pain Location: legs Pain Descriptors / Indicators: Discomfort Pain Intervention(s): Monitored during session, Limited activity within patient's tolerance, Repositioned    Home Living                          Prior Function            PT Goals (current goals can now be found in the care plan section) Acute Rehab PT Goals Patient Stated Goal: to go home PT Goal Formulation: With patient/family Time For Goal Achievement: 08/28/23 Potential to Achieve Goals: Fair Progress towards PT goals: Progressing toward goals    Frequency    Min 1X/week      PT Plan      Co-evaluation               AM-PAC PT "6 Clicks" Mobility   Outcome Measure  Help needed turning from your back to your side while in a flat bed without using bedrails?: A Little Help needed moving from lying on your back to sitting on the side of a flat bed without using bedrails?: A Little Help needed moving to and from a bed to a chair (including a wheelchair)?: A Little Help needed standing up from a chair using your arms (e.g., wheelchair or bedside chair)?: A Little Help needed to walk in hospital room?: A Little Help needed climbing 3-5 steps with a railing? : A Lot 6 Click Score: 17    End of Session Equipment Utilized During Treatment: Gait belt;Oxygen Activity Tolerance: Patient  tolerated treatment well Patient left: with call bell/phone within reach;in chair   PT Visit Diagnosis: Other abnormalities of gait and mobility (R26.89);Unsteadiness on feet (R26.81);Muscle weakness (generalized) (M62.81);History of falling (Z91.81);Difficulty in walking, not elsewhere classified (R26.2)     Time: 6213-0865 PT Time Calculation (min) (ACUTE ONLY): 41 min  Charges:    $Gait Training: 23-37 mins $Therapeutic Activity: 8-22 mins PT General Charges $$ ACUTE PT VISIT: 1 Visit                     Virgil Benedict, PT, DPT Acute Rehabilitation Services  Office: [phone]    Bettina Gavia 08/16/2023, 10:59 AM

## 2023-08-16 NOTE — Plan of Care (Signed)
  Problem: Education: Goal: Knowledge of General Education information will improve Description: Including pain rating scale, medication(s)/side effects and non-pharmacologic comfort measures Outcome: Progressing   Problem: Activity: Goal: Risk for activity intolerance will decrease Outcome: Progressing   Problem: Coping: Goal: Level of anxiety will decrease Outcome: Progressing   

## 2023-08-17 DIAGNOSIS — M25561 Pain in right knee: Secondary | ICD-10-CM | POA: Diagnosis not present

## 2023-08-17 DIAGNOSIS — R5381 Other malaise: Secondary | ICD-10-CM

## 2023-08-17 DIAGNOSIS — E8770 Fluid overload, unspecified: Secondary | ICD-10-CM | POA: Diagnosis not present

## 2023-08-17 LAB — CBC
HCT: 31.3 % — ABNORMAL LOW (ref 39.0–52.0)
Hemoglobin: 9.1 g/dL — ABNORMAL LOW (ref 13.0–17.0)
MCH: 28.9 pg (ref 26.0–34.0)
MCHC: 29.1 g/dL — ABNORMAL LOW (ref 30.0–36.0)
MCV: 99.4 fL (ref 80.0–100.0)
Platelets: 94 10*3/uL — ABNORMAL LOW (ref 150–400)
RBC: 3.15 MIL/uL — ABNORMAL LOW (ref 4.22–5.81)
RDW: 16.7 % — ABNORMAL HIGH (ref 11.5–15.5)
WBC: 6.2 10*3/uL (ref 4.0–10.5)
nRBC: 0 % (ref 0.0–0.2)

## 2023-08-17 LAB — BASIC METABOLIC PANEL
Anion gap: 7 (ref 5–15)
BUN: 31 mg/dL — ABNORMAL HIGH (ref 8–23)
CO2: 28 mmol/L (ref 22–32)
Calcium: 8.5 mg/dL — ABNORMAL LOW (ref 8.9–10.3)
Chloride: 102 mmol/L (ref 98–111)
Creatinine, Ser: 1.81 mg/dL — ABNORMAL HIGH (ref 0.61–1.24)
GFR, Estimated: 35 mL/min — ABNORMAL LOW (ref >60)
Glucose, Bld: 107 mg/dL — ABNORMAL HIGH (ref 70–99)
Potassium: 4.3 mmol/L (ref 3.5–5.1)
Sodium: 137 mmol/L (ref 135–145)

## 2023-08-17 LAB — MAGNESIUM: Magnesium: 1.9 mg/dL (ref 1.7–2.4)

## 2023-08-17 LAB — PROTIME-INR
INR: 2 — ABNORMAL HIGH (ref 0.8–1.2)
Prothrombin Time: 23.3 s — ABNORMAL HIGH (ref 11.4–15.2)

## 2023-08-17 MED ORDER — APIXABAN 2.5 MG PO TABS
2.5000 mg | ORAL_TABLET | Freq: Two times a day (BID) | ORAL | Status: DC
  Administered 2023-08-17 – 2023-08-19 (×5): 2.5 mg via ORAL
  Filled 2023-08-17 (×5): qty 1

## 2023-08-17 NOTE — Progress Notes (Signed)
PROGRESS NOTE    [name].  WGN:[id number] DOB: 1931/06/07 DOA: 08/13/2023 PCP: Ronnald Nian, MD    Brief Narrative:  89 y/o male w/ PMHx of CAD, chronic thrombocytopenia, HLD, HTN, permanent A-fib, chronic anticoagulation, sp PPM, history of gout. She follows with [facility] cardiology. At baseline patient weak, uses a walker for ambulation.  On the day of admission had a fall which was witnessed without loss of consciousness or evidence of seizure.  Upon admission noted to have some evidence of volume overload.  Hospital course course complicated by likely overdiuresis causing AKI requiring brief course of IV fluid.  In the meantime encourage oral hydration.  Eventually PT recommended CIR therefore inpatient rehab team consulted   Assessment & Plan:  Principal Problem:   Fluid overload Active Problems:   Hyperlipidemia LDL goal <70   Primary hypertension   Long term (current) use of anticoagulants   CAD (coronary artery disease)   Permanent atrial fibrillation (HCC)   Fall at home, initial encounter     Acute congestive heart failure with reduced EF, 40% -Now appears more euvolemic. Will rising Cr, will stop Lasix. Echo shows reduced EF with global hypokinesia.  EF is 40%.  He needs to aggressively use incentive spirometer. -Continue Coreg, statin. -Cardiology outpatient follow up  AKI; Baseline 1.3 -Cr today 1.8.  Creatinine plateauing with slight improvement.  Encourage oral hydration, IV as needed if necessary.   Chronic lower extremity skin changes secondary to venous stasis. -Picture in the media tab reviewed.  No clear evidence of infection.  Hold off on antibiotics   Intertrigo in moist folds/groin -Nystatin cream   Essential hypertension -Continue Coreg.   IV as needed   Physical deconditioning with fall Right knee pain -Trauma workup is negative.  PT/OT-home health -Right knee x-ray negative for any acute pathology.  Intact arthroplasty     CAD  (coronary artery disease), no chest pain -Coreg, simvastatin, Coreg resumed    Thrombocytopenia - chronic -Baseline stable   Hyperlipidemia LDL goal <70 -Simvastatin resumed   Permanent atrial fibrillation (HCC) History of symptomatic bradycardia status post pacemaker Chronic anticoagulation -Coumadin resumed, pharmacy to dose.  Continue Coreg   Gout -Resume allopurinol   BPH - home flomax and finasteride  PT/OT CIR Patient's overall evaluation   DVT prophylaxis: home warfarin Code Status: dnr Family Communication: Daughter at bedside  Remains inpatient appropriate because: Hopefully CIR soon   Subjective: Seen at bedside, doing little better today. Daughter and nursing concerned about him coughing every time he tries to eat and drink.  Examination:  General exam: Appears calm and comfortable; elderly frail  Respiratory system: Mild bibasilar crackles Cardiovascular system: S1 & S2 heard, RRR. No JVD, murmurs, rubs, gallops or clicks. No pedal edema. Gastrointestinal system: Abdomen is nondistended, soft and nontender. No organomegaly or masses felt. Normal bowel sounds heard. Central nervous system: Alert and oriented. No focal neurological deficits. Extremities: Symmetric 5 x 5 power. Skin: chronic b/l le skin changes.  Psychiatry: Judgement and insight appear normal. Mood & affect appropriate.                Diet Orders (From admission, onward)     Start     Ordered   08/13/23 2130  Diet Heart Room service appropriate? Yes; Fluid consistency: Thin  Diet effective now       Question Answer Comment  Room service appropriate? Yes   Fluid consistency: Thin      08/13/23 2131  Objective: Vitals:   08/17/23 0800 08/17/23 0833 08/17/23 0900 08/17/23 1145  BP:  (!) 108/52  125/73  Pulse:    68  Resp: (!) 22  18 18   Temp:    97.7 F (36.5 C)  TempSrc:    Oral  SpO2:   96% 96%  Weight:      Height:        Intake/Output Summary  (Last 24 hours) at 08/17/2023 1224 Last data filed at 08/17/2023 0849 Gross per 24 hour  Intake 385 ml  Output 400 ml  Net -15 ml   Filed Weights   08/15/23 0006 08/16/23 0438 08/17/23 0500  Weight: 87.3 kg 85.4 kg 83.6 kg    Scheduled Meds:  allopurinol  100 mg Oral Daily   apixaban  2.5 mg Oral BID   carvedilol  3.125 mg Oral BID WC   finasteride  5 mg Oral Daily   latanoprost  1 drop Both Eyes QHS   nystatin cream   Topical BID   simvastatin  20 mg Oral QHS   sodium chloride flush  3 mL Intravenous Q12H   tamsulosin  0.8 mg Oral Daily   Continuous Infusions:  Nutritional status     Body mass index is 31.64 kg/m.  Data Reviewed:   CBC: Recent Labs  Lab 08/13/23 1644 08/13/23 1653 08/14/23 0505 08/16/23 0303 08/17/23 0240  WBC 6.6  --  7.1 6.1 6.2  NEUTROABS  --   --  5.0  --   --   HGB 10.9* 12.6* 10.4* 9.7* 9.1*  HCT 37.2* 37.0* 34.8* 32.4* 31.3*  MCV 99.7  --  98.0 99.1 99.4  PLT 131*  --  125* 113* 94*   Basic Metabolic Panel: Recent Labs  Lab 08/14/23 0505 08/15/23 0303 08/16/23 0303 08/16/23 1008 08/17/23 0240  NA 144 142 139 138 137  K 4.4 4.3 4.2 4.1 4.3  CL 106 107 104 102 102  CO2 28 29 29 30 28   GLUCOSE 91 103* 119* 104* 107*  BUN 25* 25* 28* 28* 31*  CREATININE 1.38* 1.71* 1.87* 1.88* 1.81*  CALCIUM 9.2 8.9 8.5* 8.4* 8.5*  MG 1.7 1.8 1.8  --  1.9  PHOS  --   --  4.0  --   --    GFR: Estimated Creatinine Clearance: 26.5 mL/min (A) (by C-G formula based on SCr of 1.81 mg/dL (H)). Liver Function Tests: Recent Labs  Lab 08/13/23 1644  AST 27  ALT 12  ALKPHOS 86  BILITOT 1.1  PROT 6.0*  ALBUMIN 3.2*   No results for input(s): "LIPASE", "AMYLASE" in the last 168 hours. No results for input(s): "AMMONIA" in the last 168 hours. Coagulation Profile: Recent Labs  Lab 08/13/23 1644 08/14/23 0505 08/15/23 0303 08/16/23 0303 08/17/23 0240  INR 2.3* 2.5* 3.3* 2.5* 2.0*   Cardiac Enzymes: No results for input(s): "CKTOTAL",  "CKMB", "CKMBINDEX", "TROPONINI" in the last 168 hours. BNP (last 3 results) No results for input(s): "PROBNP" in the last 8760 hours. HbA1C: No results for input(s): "HGBA1C" in the last 72 hours. CBG: No results for input(s): "GLUCAP" in the last 168 hours. Lipid Profile: No results for input(s): "CHOL", "HDL", "LDLCALC", "TRIG", "CHOLHDL", "LDLDIRECT" in the last 72 hours. Thyroid Function Tests: No results for input(s): "TSH", "T4TOTAL", "FREET4", "T3FREE", "THYROIDAB" in the last 72 hours. Anemia Panel: No results for input(s): "VITAMINB12", "FOLATE", "FERRITIN", "TIBC", "IRON", "RETICCTPCT" in the last 72 hours. Sepsis Labs: Recent Labs  Lab 08/13/23 1654  LATICACIDVEN 1.6  No results found for this or any previous visit (from the past 240 hour(s)).       Radiology Studies: No results found.         LOS: 4 days   Time spent= 35 mins    Miguel Rota, MD [facility]  If 7PM-7AM, please contact night-coverage  08/17/2023, 12:24 PM

## 2023-08-17 NOTE — Progress Notes (Signed)
Inpatient Rehab Admissions Coordinator:   I met with pt and his daughter at bedside to review CIR recommendations and consult from Dr. Riley Kill.  Daughter interested in pursuing CIR and can provide support at discharge.  We reviewed need for [facility] Medicare auth and I will request that once OT has a chance to see pt today.  Will follow.   Estill Dooms, PT, DPT Admissions Coordinator [phone] 08/17/23  1:18 PM

## 2023-08-17 NOTE — Consult Note (Signed)
Physical Medicine and Rehabilitation Consult Reason for Consult:debility after admission for CHF Referring Physician: Nelson Chimes   HPI: [name]. is a 89 y.o. male with a history of CAD, atrial fibrillation on anticoagulation, gout, chronic thrombocytopenia who developed increasing weakness and shortness of breath.  He was admitted on 08/13/2023 with fluid overload.  Patient was diuresed and volume status is improving.  Echo showed global hypokinesia and ejection fraction of 40%.  Patient also with acute kidney injury and creatinine up to 1.8.  This is possibly due to diuresis.  Patient worked with therapy yesterday and was min assist for sit to stand transfers and walked 75 feet with contact-guard assist.  Patient needed repeated cues to improve posture and he fatigue quite easily.  As he fatigues his posture tends to worsen.  Patient was unable to navigate more than 2 steps at a time due to significant fatigue.  Patient lives with his daughter in a 1 level house with 6 steps to enter.  He used a rolling walker and was modified independent prior to this admission with mobility and basic self-care.  He had experienced 2  falls over the last 6 months, however one of those was just prior to his admission when he was fluid overloaded.   Review of Systems  Constitutional:  Negative for fever.  HENT:  Negative for hearing loss.   Eyes:  Negative for blurred vision.  Respiratory:  Positive for cough and shortness of breath.   Cardiovascular:  Positive for leg swelling.  Gastrointestinal:  Negative for nausea and vomiting.  Genitourinary:  Negative for dysuria.  Musculoskeletal:  Positive for back pain, falls, joint pain and myalgias.  Skin:  Negative for itching and rash.  Neurological:  Positive for weakness. Negative for dizziness, sensory change, speech change and focal weakness.  Psychiatric/Behavioral:  Negative for depression and suicidal ideas.    Past Medical History:  Diagnosis  Date   A-fib [facility])    Arthritis    CAD (coronary artery disease)    GXT, neg bruce protocol GXT   Chronic kidney disease    RENAL INSUFFICIENCY   Diverticulosis    ED (erectile dysfunction)    GERD (gastroesophageal reflux disease)    Glaucoma    Gout    no flare in years    Heart disorder    Hypertension    Obesity    Squamous cell carcinoma in situ    Past Surgical History:  Procedure Laterality Date   CATARACT EXTRACTION, BILATERAL  2018   with lens placement    PACEMAKER INSERTION  01/09/03   TONSILLECTOMY     TOTAL KNEE ARTHROPLASTY Right 02/27/2018   Procedure: RIGHT TOTAL KNEE ARTHROPLASTY;  Surgeon: Durene Romans, MD;  Location: [facility] ORS;  Service: Orthopedics;  Laterality: Right;  70 mins   TOTAL KNEE ARTHROPLASTY Left 09/06/2018   Procedure: LEFT TOTAL KNEE ARTHROPLASTY;  Surgeon: Durene Romans, MD;  Location: [facility] ORS;  Service: Orthopedics;  Laterality: Left;  70 mins   Family History  Problem Relation Age of Onset   Heart disease Mother    Hypertension Mother    Kidney disease Mother    Stroke Mother    Heart disease Father    Social History:  reports that he quit smoking about 43 years ago. His smoking use included cigarettes. He started smoking about 53 years ago. He has a 10 pack-year smoking history. He has never used smokeless tobacco. He reports current alcohol use. He  reports that he does not use drugs. Allergies:  Allergies  Allergen Reactions   Tramadol Itching   Medications Prior to Admission  Medication Sig Dispense Refill   acetaminophen (TYLENOL) 500 MG tablet Take 500 mg by mouth every 6 (six) hours as needed (for pain).     allopurinol (ZYLOPRIM) 100 MG tablet TAKE 1 TABLET BY MOUTH DAILY 100 tablet 1   carvedilol (COREG) 3.125 MG tablet Take 1 tablet (3.125 mg total) by mouth 2 (two) times daily with a meal. 60 tablet 0   Cholecalciferol (VITAMIN D3) 1000 units CAPS Take 2,000 Units by mouth daily.     finasteride (PROSCAR) 5 MG tablet TAKE 1  TABLET BY MOUTH DAILY 100 tablet 1   furosemide (LASIX) 20 MG tablet Take 1 tablet (20 mg total) by mouth daily. 30 tablet 3   latanoprost (XALATAN) 0.005 % ophthalmic solution Place 1 drop into both eyes at bedtime.   3   Multiple Vitamins-Minerals (PRESERVISION AREDS 2 PO) Take 1 capsule by mouth 2 (two) times daily.      simvastatin (ZOCOR) 20 MG tablet Take 1 tablet (20 mg total) by mouth at bedtime. 90 tablet 1   tamsulosin (FLOMAX) 0.4 MG CAPS capsule Take 2 capsules (0.8 mg total) by mouth daily. 100 capsule 2   warfarin (COUMADIN) 2.5 MG tablet TAKE 1 TO 1 AND 1/2 TABLETS BY  MOUTH DAILY AS DIRECTED BY THE  COUMADIN CLINIC (Patient taking differently: Take by mouth at bedtime. TAKE 1 TO 1 AND 1/2 TABLETS BY  MOUTH DAILY AS DIRECTED BY THE COUMADIN CLINIC Take 2.5mg  (1 tablet) every night except for Sundays and Wednesdays, take 1.25mg  (0.5 tablet)) 150 tablet 1    Home: Home Living Family/patient expects to be discharged to:: Private residence Living Arrangements: Children (daughter) Available Help at Discharge: Family, Available 24 hours/day Type of Home: House Home Access: Stairs to enter [employer] of Steps: 6 Entrance Stairs-Rails: Can reach both, Right, Left Home Layout: One level Bathroom Shower/Tub: Health visitor: Standard Home Equipment: Information systems manager, Agricultural consultant (2 wheels), Hand held shower head, Grab bars - tub/shower, Grab bars - toilet Additional Comments: Pt reports his daughter is a retired Runner, broadcasting/film/video, per notes it says retired Charity fundraiser. pt reports one other fall in the last year.  Functional History: Prior Function Prior Level of Function : History of Falls (last six months), Independent/Modified Independent Mobility Comments: ind with RW ADLs Comments: Ind, daughter assists with driving and iADLs Functional Status:  Mobility: Bed Mobility Overal bed mobility: Needs Assistance Bed Mobility: Supine to Sit Supine to sit: Min assist, HOB  elevated, Used rails Sit to supine: Min assist, HOB elevated General bed mobility comments: Extra time and cues needed for pt to bring bil hands to L bed rail to pull to ascend trunk, extra time to manage each leg off L EOB, minA at trunk to sit up and scoot hips to EOB Transfers Overall transfer level: Needs assistance Equipment used: Rolling walker (2 wheels) Transfers: Sit to/from Stand Sit to Stand: Min assist General transfer comment: Cues provided to scoot to edge and for hand placement transferring sit <> stand, minA to power up to stand from low EOB 1x and from recliner 1x. Ambulation/Gait Ambulation/Gait assistance: Contact guard assist, +2 safety/equipment Gait Distance (Feet): 75 Feet Assistive device: Rolling walker (2 wheels) Gait Pattern/deviations: Trunk flexed, Decreased stride length, Step-through pattern General Gait Details: Pt ambulates without bumping into obstacles today but with a very flexed posture, needing repeated  verbal and tactile cues to improve his upright posture. As distance progresses and pt appears to fatigue his posture worsens and his speed increases. No LOB, CGA for safety, chair follor for safety Gait velocity: Decreased Gait velocity interpretation: <1.8 ft/sec, indicate of risk for recurrent falls Stairs: Yes Stairs assistance: Min assist, Mod assist Stair Management: One rail Right, One rail Left, Step to pattern, Sideways Number of Stairs: 2 General stair comments: Ascends with bil hands on R rail, leading up with his R. Pt needed cues to bring his hands more anteriorly on the rails for a better pull up and anterior weight shift as he tends to lean back a little, minA for balance. Descends with bil hands on L rail, needing modA to control descent and balance.    ADL: ADL Overall ADL's : Needs assistance/impaired Eating/Feeding: Independent, Sitting Grooming: Standing, Oral care, Wash/dry face, Contact guard assist Upper Body Bathing: Sitting,  Set up Lower Body Bathing: Sitting/lateral leans, Contact guard assist Upper Body Dressing : Sitting, Set up Lower Body Dressing: Maximal assistance, Sitting/lateral leans Lower Body Dressing Details (indicate cue type and reason): Pt uses sockaid at baseline, assist needed to don socks Toilet Transfer: Contact guard assist, Ambulation, Rolling walker (2 wheels) Toileting- Clothing Manipulation and Hygiene: Contact guard assist, Sit to/from stand Functional mobility during ADLs: Contact guard assist, Rolling walker (2 wheels) General ADL Comments: Educated pt on visual scanning to reduce risk of bumping into objects on L side  Cognition: Cognition Overall Cognitive Status: No family/caregiver present to determine baseline cognitive functioning Orientation Level: Oriented to person, Oriented to place, Oriented to time, Disoriented to situation Cognition Arousal: Alert Behavior During Therapy: [facility] for tasks assessed/performed Overall Cognitive Status: No family/caregiver present to determine baseline cognitive functioning General Comments: Per chart, pt has reported being blind in the L eye during PT Eval but today denies this and daughter reports pt is not blind. Did not bump into anything today but did need repeated cues for upright posture and for safety awareness as pt's posture worsens and speed increases as he fatigues when ambulating, placing him at risk for falls.  Blood pressure (!) 108/52, pulse (!) 48, temperature 97.8 F (36.6 C), temperature source Oral, resp. rate 18, height 5\' 4"  (1.626 m), weight 83.6 kg, SpO2 96%. Physical Exam Constitutional:      General: He is not in acute distress. HENT:     Head:     Comments: Wound on right parietal scalp, serosang discharge, dressing adhered to skin    Right Ear: External ear normal.     Left Ear: External ear normal.     Mouth/Throat:     Mouth: Mucous membranes are moist.  Eyes:     Extraocular Movements: Extraocular movements  intact.     Conjunctiva/sclera: Conjunctivae normal.     Pupils: Pupils are equal, round, and reactive to light.  Cardiovascular:     Rate and Rhythm: Bradycardia present.  Pulmonary:     Effort: Pulmonary effort is normal.     Comments: O2 [place] Abdominal:     Palpations: Abdomen is soft.  Musculoskeletal:     Cervical back: Normal range of motion.     Right lower leg: Edema present.     Left lower leg: Edema present.     Comments: Bilateral TKA scars. ?pain with efforts to move left knee. Right knee with mild tenderness also d/t recent fall  Skin:    Comments: Numerous abrasions, particularly RLE d/t fall --knee and shin.  Left ankle foot dressed also, chronic stasis changes both LE's  Neurological:     Mental Status: He is alert.     Comments: Alert and oriented x 3. Normal insight and awareness. Fair Memory. Normal language and speech. Cranial nerve exam unremarkable. MMT: BUE 4/5 prox to distal. RLE 3/5 HF, KE and 4/5 ADF/PF. LLE 2/5 HF, KE and 3/5 ADF/PF. Sensory exam appears normal for light touch and pain in all 4 limbs. No limb ataxia or cerebellar signs. No abnormal tone appreciated.      Psychiatric:        Mood and Affect: Mood normal.        Behavior: Behavior normal.     Results for orders placed or performed during the hospital encounter of 08/13/23 (from the past 24 hour(s))  Basic metabolic panel     Status: Abnormal   Collection Time: 08/16/23 10:08 AM  Result Value Ref Range   Sodium 138 135 - 145 mmol/L   Potassium 4.1 3.5 - 5.1 mmol/L   Chloride 102 98 - 111 mmol/L   CO2 30 22 - 32 mmol/L   Glucose, Bld 104 (H) 70 - 99 mg/dL   BUN 28 (H) 8 - 23 mg/dL   Creatinine, Ser 3.08 (H) 0.61 - 1.24 mg/dL   Calcium 8.4 (L) 8.9 - 10.3 mg/dL   GFR, Estimated 34 (L) >60 mL/min   Anion gap 6 5 - 15  Basic metabolic panel     Status: Abnormal   Collection Time: 08/17/23  2:40 AM  Result Value Ref Range   Sodium 137 135 - 145 mmol/L   Potassium 4.3 3.5 - 5.1 mmol/L    Chloride 102 98 - 111 mmol/L   CO2 28 22 - 32 mmol/L   Glucose, Bld 107 (H) 70 - 99 mg/dL   BUN 31 (H) 8 - 23 mg/dL   Creatinine, Ser 6.57 (H) 0.61 - 1.24 mg/dL   Calcium 8.5 (L) 8.9 - 10.3 mg/dL   GFR, Estimated 35 (L) >60 mL/min   Anion gap 7 5 - 15  Protime-INR     Status: Abnormal   Collection Time: 08/17/23  2:40 AM  Result Value Ref Range   Prothrombin Time 23.3 (H) 11.4 - 15.2 seconds   INR 2.0 (H) 0.8 - 1.2  Magnesium     Status: None   Collection Time: 08/17/23  2:40 AM  Result Value Ref Range   Magnesium 1.9 1.7 - 2.4 mg/dL  CBC     Status: Abnormal   Collection Time: 08/17/23  2:40 AM  Result Value Ref Range   WBC 6.2 4.0 - 10.5 K/uL   RBC 3.15 (L) 4.22 - 5.81 MIL/uL   Hemoglobin 9.1 (L) 13.0 - 17.0 g/dL   HCT 84.6 (L) 96.2 - 95.2 %   MCV 99.4 80.0 - 100.0 fL   MCH 28.9 26.0 - 34.0 pg   MCHC 29.1 (L) 30.0 - 36.0 g/dL   RDW 84.1 (H) 32.4 - 40.1 %   Platelets 94 (L) 150 - 400 K/uL   nRBC 0.0 0.0 - 0.2 %   No results found.  Assessment/Plan: Diagnosis: 89 year old male admitted with congestive heart failure with significant deconditioning impacting his functional mobility and self-care. Does the need for close, 24 hr/day medical supervision in concert with the patient's rehab needs make it unreasonable for this patient to be served in a less intensive setting? Yes Co-Morbidities requiring supervision/potential complications:  -Congestive heart failure -Chronic atrial fibrillation on anticoagulation -Acute kidney  injury -Hypertension/CAD -Chronic left knee pain, acute right knee pain after fall -History of gout Due to bladder management, bowel management, safety, skin/wound care, disease management, medication administration, pain management, and patient education, does the patient require 24 hr/day rehab nursing? Yes Does the patient require coordinated care of a physician, rehab nurse, therapy disciplines of PT, OT to address physical and functional deficits in  the context of the above medical diagnosis(es)? Yes Addressing deficits in the following areas: balance, endurance, locomotion, strength, transferring, bowel/bladder control, bathing, dressing, feeding, grooming, toileting, and psychosocial support Can the patient actively participate in an intensive therapy program of at least 3 hrs of therapy per day at least 5 days per week? Yes The potential for patient to make measurable gains while on inpatient rehab is excellent Anticipated functional outcomes upon discharge from inpatient rehab are modified independent and supervision  with PT, modified independent and supervision with OT, n/a with SLP. Estimated rehab length of stay to reach the above functional goals is: 7-10 days Anticipated discharge destination: Home Overall Rehab/Functional Prognosis: excellent  POST ACUTE RECOMMENDATIONS: This patient's condition is appropriate for continued rehabilitative care in the following setting: CIR Patient has agreed to participate in recommended program. Yes Note that insurance prior authorization may be required for reimbursement for recommended care.  Comment: Pt was mod I with walker prior to admission. Only had fallen twice in the past 6 mos per daughter, and one of those falls was when he was acutely fluid overloaded. There are 6 steps to enter his home which he needs to be able to climb. Daughter is available to help at home.    I have personally performed a face to face diagnostic evaluation of this patient. Additionally, I have examined the patient's medical record including any pertinent labs and radiographic images. If the physician assistant has documented in this note, I have reviewed and edited or otherwise concur with the physician assistant's documentation.  Thanks,  Ranelle Oyster, MD 08/17/2023

## 2023-08-17 NOTE — Progress Notes (Signed)
Inpatient Rehab Admissions Coordinator:   Left message for pt's daughter to discuss rehab recommendations.    Estill Dooms, PT, DPT Admissions Coordinator [phone] 08/17/23  10:14 AM

## 2023-08-17 NOTE — Plan of Care (Signed)
  Problem: Health Behavior/Discharge Planning: Goal: Ability to manage health-related needs will improve Outcome: Progressing   Problem: Clinical Measurements: Goal: Ability to maintain clinical measurements within normal limits will improve Outcome: Progressing Goal: Will remain free from infection Outcome: Progressing Goal: Diagnostic test results will improve Outcome: Progressing Goal: Respiratory complications will improve Outcome: Progressing   

## 2023-08-17 NOTE — Progress Notes (Signed)
Occupational Therapy Treatment Patient Details Name: [name]. MRN: [id number] DOB: 02/26/1931 Today's Date: 08/17/2023   History of present illness 63 male who presents 08/13/23 after a witnessed fall getting out of his car. Pt sustained no LOC, CT head negative. Patient's daughter states that her daughter who is a nurse told her it looked like there was fluid on his lungs, being worked up at [facility] for fluid overload. PMH afib, artritis, CAD, CKD, Diverticulosis, Gout, Glaucoma, HTN.   OT comments  Pt not progressing as anticipated, needing more assist with STS and ambulating in hall. Pt also needing several safety cues for hand placement with transfers and a little guidance properly positioning his body for transfers. OT to continue to progress pt as able, DC plans updated as pt would need to increase with strength and safety before discharging home. Patient has the potential to reach Mod I and demos the ability to tolerate 3 hours of therapy. Pt would benefit from an intensive rehab program to help maximize functional independence.       If plan is discharge home, recommend the following:  A little help with bathing/dressing/bathroom;A little help with walking and/or transfers;Assistance with cooking/housework   Equipment Recommendations  None recommended by OT    Recommendations for Other Services      Precautions / Restrictions Precautions Precautions: Fall Restrictions Weight Bearing Restrictions: No       Mobility Bed Mobility Overal bed mobility: Needs Assistance Bed Mobility: Supine to Sit, Sit to Supine     Supine to sit: Mod assist, Used rails, HOB elevated Sit to supine: HOB elevated, Used rails, Mod assist   General bed mobility comments: cues for pt to assist with trunk control by using bed rails and to sequence bed mobility. Assist needed for BLEs    Transfers Overall transfer level: Needs assistance Equipment used: Rolling walker (2 wheels) Transfers:  Sit to/from Stand, Bed to chair/wheelchair/BSC Sit to Stand: Min assist Stand pivot transfers: Min assist         General transfer comment: cues for hand placement and to guide hips posteriorly     Balance Overall balance assessment: Needs assistance Sitting-balance support: Feet supported, No upper extremity supported Sitting balance-Leahy Scale: Fair     Standing balance support: During functional activity, Bilateral upper extremity supported Standing balance-Leahy Scale: Poor Standing balance comment: RW support                           ADL either performed or assessed with clinical judgement   ADL                           Toilet Transfer: Minimal assistance;Stand-pivot;BSC/3in1;Rolling walker (2 wheels)   Toileting- Clothing Manipulation and Hygiene: Maximal assistance;Sit to/from stand       Functional mobility during ADLs: Minimal assistance;Rolling walker (2 wheels)      Extremity/Trunk Assessment Upper Extremity Assessment Upper Extremity Assessment: Overall WFL for tasks assessed            Vision       Perception     Praxis      Cognition Arousal: Alert Behavior During Therapy: WFL for tasks assessed/performed Overall Cognitive Status: Impaired/Different from baseline Area of Impairment: Safety/judgement, Following commands, Problem solving                       Following Commands: Follows one step  commands with increased time Safety/Judgement: Decreased awareness of safety   Problem Solving: Slow processing, Requires verbal cues, Requires tactile cues          Exercises      Shoulder Instructions       General Comments VSS on 2L during mobility    Pertinent Vitals/ Pain       Pain Assessment Pain Assessment: Faces Faces Pain Scale: Hurts little more Pain Location: Bottom of L foot Pain Descriptors / Indicators: Discomfort Pain Intervention(s): Limited activity within patient's tolerance,  Monitored during session, Repositioned  Home Living                                          Prior Functioning/Environment              Frequency  Min 1X/week        Progress Toward Goals  OT Goals(current goals can now be found in the care plan section)  Progress towards OT goals: Not progressing toward goals - comment (Pt not moving as well as on IE)  Acute Rehab OT Goals Patient Stated Goal: To go home OT Goal Formulation: With patient Time For Goal Achievement: 08/28/23 Potential to Achieve Goals: Good  Plan      Co-evaluation                 AM-PAC OT "6 Clicks" Daily Activity     Outcome Measure   Help from another person eating meals?: None Help from another person taking care of personal grooming?: A Little Help from another person toileting, which includes using toliet, bedpan, or urinal?: A Lot Help from another person bathing (including washing, rinsing, drying)?: A Little Help from another person to put on and taking off regular upper body clothing?: A Little Help from another person to put on and taking off regular lower body clothing?: A Lot 6 Click Score: 17    End of Session Equipment Utilized During Treatment: Rolling walker (2 wheels);Gait belt  OT Visit Diagnosis: Unsteadiness on feet (R26.81);History of falling (Z91.81);Muscle weakness (generalized) (M62.81)   Activity Tolerance Patient tolerated treatment well   Patient Left in bed;with call bell/phone within reach;with bed alarm set   Nurse Communication Mobility status        Time: 1610-9604 OT Time Calculation (min): 42 min  Charges: OT General Charges $OT Visit: 1 Visit OT Treatments $Self Care/Home Management : 8-22 mins $Therapeutic Activity: 23-37 mins  08/17/2023  AB, OTR/L  Acute Rehabilitation Services  Office: [phone]   Tristan Schroeder 08/17/2023, 3:47 PM

## 2023-08-17 NOTE — Evaluation (Signed)
Clinical/Bedside Swallow Evaluation Patient Details  Name: [name]. MRN: [id number] Date of Birth: [birth year]  Today's Date: 08/17/2023 Time: SLP Start Time (ACUTE ONLY): 1417 SLP Stop Time (ACUTE ONLY): 1430 SLP Time Calculation (min) (ACUTE ONLY): 13 min  Past Medical History:  Past Medical History:  Diagnosis Date   A-fib (HCC)    Arthritis    CAD (coronary artery disease)    GXT, neg bruce protocol GXT   Chronic kidney disease    RENAL INSUFFICIENCY   Diverticulosis    ED (erectile dysfunction)    GERD (gastroesophageal reflux disease)    Glaucoma    Gout    no flare in years    Heart disorder    Hypertension    Obesity    Squamous cell carcinoma in situ    Past Surgical History:  Past Surgical History:  Procedure Laterality Date   CATARACT EXTRACTION, BILATERAL  2018   with lens placement    PACEMAKER INSERTION  01/09/03   TONSILLECTOMY     TOTAL KNEE ARTHROPLASTY Right 02/27/2018   Procedure: RIGHT TOTAL KNEE ARTHROPLASTY;  Surgeon: Durene Romans, MD;  Location: [facility];  Service: Orthopedics;  Laterality: Right;  70 mins   TOTAL KNEE ARTHROPLASTY Left 09/06/2018   Procedure: LEFT TOTAL KNEE ARTHROPLASTY;  Surgeon: Durene Romans, MD;  Location: [facility];  Service: Orthopedics;  Laterality: Left;  70 mins   HPI:  [name] is a 89 yo male presenting to ED 12/1 with chronic progressive weakness after a witnessed fall. He sustained no LOC, CTH negative. Admitted with volume overload c/b likely over-diuresis causing AKI. PMH includes CAD, chronic thrombocytopenia, HLD, HTN, permanent A-fib on chronic antiocoagulation, s/p PPM, gout, GERD    Assessment / Plan / Recommendation  Clinical Impression  Pt presents with an intermittent congested cough at baseline in the absence of POs. Oral motor exam WFL. Trials of thin liquids consistently resulted in an immediate cough, which was wet in quality. Purees and solids noted without overt s/s of dysphagia. Pt with  increased WOB, which may be contributing to his presentation this date as he attempts coordinating breathing and swallowing. Recommend continuing current diet pending completion of MBS, which will likely occur next date. Will continue to follow. SLP Visit Diagnosis: Dysphagia, unspecified (R13.10)    Aspiration Risk  Moderate aspiration risk    Diet Recommendation Regular;Thin liquid    Liquid Administration via: Cup Medication Administration: Crushed with puree Supervision: Patient able to self feed;Full supervision/cueing for compensatory strategies Compensations: Minimize environmental distractions;Slow rate;Small sips/bites Postural Changes: Seated upright at 90 degrees    Other  Recommendations Oral Care Recommendations: Oral care QID;Staff/trained caregiver to provide oral care    Recommendations for follow up therapy are one component of a multi-disciplinary discharge planning process, led by the attending physician.  Recommendations may be updated based on patient status, additional functional criteria and insurance authorization.  Follow up Recommendations Acute inpatient rehab (3hours/day)      Assistance Recommended at Discharge    Functional Status Assessment Patient has had a recent decline in their functional status and demonstrates the ability to make significant improvements in function in a reasonable and predictable amount of time.  Frequency and Duration min 2x/week  2 weeks       Prognosis Prognosis for improved oropharyngeal function: Good Barriers to Reach Goals: Cognitive deficits;Time post onset      Swallow Study   General HPI: [name] is a 89 yo  male presenting to ED 12/1 with chronic progressive weakness after a witnessed fall. He sustained no LOC, CTH negative. Admitted with volume overload c/b likely over-diuresis causing AKI. PMH includes CAD, chronic thrombocytopenia, HLD, HTN, permanent A-fib on chronic antiocoagulation, s/p PPM, gout,  GERD Type of Study: Bedside Swallow Evaluation Previous Swallow Assessment: none in chart Diet Prior to this Study: Regular;Thin liquids (Level 0) Temperature Spikes Noted: No Respiratory Status: Room air History of Recent Intubation: No Behavior/Cognition: Alert;Cooperative;Requires cueing Oral Cavity Assessment: Within Functional Limits Oral Care Completed by SLP: No Oral Cavity - Dentition: Adequate natural dentition Vision: Functional for self-feeding Self-Feeding Abilities: Able to feed self;Needs assist Patient Positioning: Upright in bed Baseline Vocal Quality: Normal Volitional Cough: Strong Volitional Swallow: Able to elicit    Oral/Motor/Sensory Function Overall Oral Motor/Sensory Function: Within functional limits   Ice Chips Ice chips: Not tested   Thin Liquid Thin Liquid: Impaired Presentation: Straw;Cup Pharyngeal  Phase Impairments: Decreased hyoid-laryngeal movement;Multiple swallows;Wet Vocal Quality;Cough - Immediate    Nectar Thick Nectar Thick Liquid: Not tested   Honey Thick Honey Thick Liquid: Not tested   Puree Puree: Within functional limits Presentation: Spoon   Solid     Solid: Within functional limits      Gwynneth Aliment, M.A., CF-SLP Speech Language Pathology, Acute Rehabilitation Services  Secure Chat preferred [phone]  08/17/2023,3:06 PM

## 2023-08-17 NOTE — TOC Initial Note (Signed)
Transition of Care (TOC) - Initial/Assessment Note    Patient Details  Name: [name]. MRN: [id number] Date of Birth: November 05, 1930  Transition of Care [facility]) CM/SW Contact:    Michaela Corner, LCSWA Phone Number: 08/17/2023, 2:07 PM  Clinical Narrative:   CSW spoke with pts dtr about SNF as backup plan. Dtr is agreeable CSW sending out referrals. She has preference of [facility] on [address].                 Expected Discharge Plan: Skilled Nursing Facility Barriers to Discharge: Continued Medical Work up, Other (must enter comment)   Patient Goals and CMS Choice Patient states their goals for this hospitalization and ongoing recovery are:: Unable to state goal at this time          Expected Discharge Plan and Services In-house Referral: Clinical Social Work     Living arrangements for the past 2 months: Independent Living Facility                                      Prior Living Arrangements/Services Living arrangements for the past 2 months: Independent Living Facility   Patient language and need for interpreter reviewed:: Yes Do you feel safe going back to the place where you live?: Yes      Need for Family Participation in Patient Care: Yes (Comment) Care giver support system in place?: No (comment)   Criminal Activity/Legal Involvement Pertinent to Current Situation/Hospitalization: No - Comment as needed  Activities of Daily Living   ADL Screening (condition at time of admission) Independently performs ADLs?: Yes (appropriate for developmental age) Is the patient deaf or have difficulty hearing?: Yes Does the patient have difficulty seeing, even when wearing glasses/contacts?: No Does the patient have difficulty concentrating, remembering, or making decisions?: Yes  Permission Sought/Granted                  Emotional Assessment Appearance:: Appears stated age Attitude/Demeanor/Rapport: Unable to Assess Affect (typically  observed): Unable to Assess Orientation: : Oriented to Self, Oriented to Place, Oriented to  Time Alcohol / Substance Use: Not Applicable Psych Involvement: No (comment)  Admission diagnosis:  Fluid overload [E87.70] Fall in home, initial encounter [W19.XXXA, Y92.009] Acute on chronic congestive heart failure, unspecified heart failure type [facility]) [I50.9] Patient Active Problem List   Diagnosis Date Noted   Fluid overload 08/13/2023   Fall at home, initial encounter 08/13/2023   Paroxysmal atrial fibrillation (HCC) 08/13/2023   History of cataract extraction 01/23/2023   Muscular deconditioning 04/27/2022   Aortic atherosclerosis (HCC) 10/29/2020   Bradycardia 09/26/2019   Gout involving toe 08/07/2019   Benign prostatic hyperplasia with urinary frequency 03/21/2019   Status post total bilateral knee replacement 09/06/2018   Obesity    GERD (gastroesophageal reflux disease)    ED (erectile dysfunction)    Diverticulosis    CAD (coronary artery disease)    Permanent atrial fibrillation (HCC)    Stage 3a chronic kidney disease (HCC) 01/13/2017   Arthritis 12/01/2014   Abnormality of gait 02/05/2013   Long term (current) use of anticoagulants 12/29/2012   Glaucoma 07/26/2011   Hyperlipidemia LDL goal <70 07/26/2011   Primary hypertension 07/26/2011   PACEMAKER-St.Jude 12/26/2008   PCP:  Ronnald Nian, MD Pharmacy:   [facility]) [place], [place] - [address] [place] [address]  [place] [address] Phone: [phone] Fax: [phone]  [facility] Delivery - [place], [place] - [address] [place]  [address] Phone: [phone] Fax: [phone]     Social Determinants of Health (SDOH) Social History: SDOH Screenings   Food Insecurity: No Food Insecurity (08/14/2023)  Housing: Low Risk  (08/14/2023)  Transportation Needs: No Transportation Needs (08/14/2023)  Utilities:  Not At Risk (08/14/2023)  Depression (PHQ2-9): Low Risk  (01/23/2023)  Financial Resource Strain: Low Risk  (06/16/2022)  Tobacco Use: Medium Risk (08/14/2023)   SDOH Interventions:     Readmission Risk Interventions     No data to display

## 2023-08-17 NOTE — NC FL2 (Signed)
[place] MEDICAID FL2 LEVEL OF CARE FORM     IDENTIFICATION  Patient Name: [name]. Birthdate: 03-Apr-1931 Sex: male Admission Date (Current Location): 08/13/2023  [facility] and [place] Number:  Producer, television/film/video and Address:  [facility], [address], [place], [place] [address]      Provider Number: [id number]  Attending Physician Name and Address:  Miguel Rota, MD  Relative Name and Phone Number:       Current Level of Care: Hospital Recommended Level of Care: Skilled Nursing Facility Prior Approval Number:    Date Approved/Denied:   PASRR Number: [id number]  Discharge Plan: SNF    Current Diagnoses: Patient Active Problem List   Diagnosis Date Noted   Fluid overload 08/13/2023   Fall at home, initial encounter 08/13/2023   Paroxysmal atrial fibrillation (HCC) 08/13/2023   History of cataract extraction 01/23/2023   Muscular deconditioning 04/27/2022   Aortic atherosclerosis (HCC) 10/29/2020   Bradycardia 09/26/2019   Gout involving toe 08/07/2019   Benign prostatic hyperplasia with urinary frequency 03/21/2019   Status post total bilateral knee replacement 09/06/2018   Obesity    GERD (gastroesophageal reflux disease)    ED (erectile dysfunction)    Diverticulosis    CAD (coronary artery disease)    Permanent atrial fibrillation (HCC)    Stage 3a chronic kidney disease (HCC) 01/13/2017   Arthritis 12/01/2014   Abnormality of gait 02/05/2013   Long term (current) use of anticoagulants 12/29/2012   Glaucoma 07/26/2011   Hyperlipidemia LDL goal <70 07/26/2011   Primary hypertension 07/26/2011   PACEMAKER-St.Jude 12/26/2008    Orientation RESPIRATION BLADDER Height & Weight     Self, Time, Place  O2 (2L [place]) Continent, External catheter Weight: 184 lb 4.9 oz (83.6 kg) Height:  5\' 4"  (162.6 cm)  BEHAVIORAL SYMPTOMS/MOOD NEUROLOGICAL BOWEL NUTRITION STATUS      Continent Diet (see dc summary)  AMBULATORY STATUS  COMMUNICATION OF NEEDS Skin   Limited Assist Verbally Other (Comment), Surgical wounds (Wound / Incision - Knee Anterior, Right scrap knee; Face Lateral Right Upper; Face Lateral Right Upper;  Irritant Dermatitis (Moisture Associated Skin Damage) Buttocks Right, Left)                       Personal Care Assistance Level of Assistance  Bathing, Feeding, Dressing Bathing Assistance: Limited assistance Feeding assistance: Independent Dressing Assistance: Limited assistance     Functional Limitations Info  Hearing, Sight, Speech Sight Info: Impaired (glasses) Hearing Info: Adequate Speech Info: Adequate    SPECIAL CARE FACTORS FREQUENCY  PT (By licensed PT), OT (By licensed OT)     PT Frequency: 5x week OT Frequency: 5x week            Contractures Contractures Info: Not present    Additional Factors Info  Code Status, Allergies Code Status Info: DNR comfort Allergies Info: Tramadol           Current Medications (08/17/2023):  This is the current hospital active medication list Current Facility-Administered Medications  Medication Dose Route Frequency Provider Last Rate Last Admin   acetaminophen (TYLENOL) tablet 650 mg  650 mg Oral Q4H PRN Crosley, Debby, MD       allopurinol (ZYLOPRIM) tablet 100 mg  100 mg Oral Daily Kathrynn Running, MD   100 mg at 08/17/23 0833   carvedilol (COREG) tablet 3.125 mg  3.125 mg Oral BID WC Crosley, Debby, MD   3.125 mg at  08/17/23 0833   finasteride (PROSCAR) tablet 5 mg  5 mg Oral Daily Crosley, Debby, MD   5 mg at 08/17/23 0833   guaiFENesin (ROBITUSSIN) 100 MG/5ML liquid 5 mL  5 mL Oral Q4H PRN Amin, Ankit C, MD       hydrALAZINE (APRESOLINE) injection 10 mg  10 mg Intravenous Q4H PRN Amin, Ankit C, MD       ipratropium-albuterol (DUONEB) 0.5-2.5 (3) MG/3ML nebulizer solution 3 mL  3 mL Nebulization Q4H PRN Amin, Ankit C, MD       latanoprost (XALATAN) 0.005 % ophthalmic solution 1 drop  1 drop Both Eyes QHS Wouk, Wilfred Curtis,  MD   1 drop at 08/16/23 2211   metoprolol tartrate (LOPRESSOR) injection 5 mg  5 mg Intravenous Q4H PRN Amin, Ankit C, MD       nystatin cream (MYCOSTATIN)   Topical BID Gery Pray, MD   Given at 08/17/23 0840   OLANZapine (ZYPREXA) injection 5 mg  5 mg Intramuscular Q6H PRN Amin, Ankit C, MD       ondansetron (ZOFRAN) injection 4 mg  4 mg Intravenous Q6H PRN Crosley, Debby, MD       senna-docusate (Senokot-S) tablet 1 tablet  1 tablet Oral QHS PRN Amin, Ankit C, MD       simvastatin (ZOCOR) tablet 20 mg  20 mg Oral QHS Crosley, Debby, MD   20 mg at 08/16/23 2210   sodium chloride flush (NS) 0.9 % injection 3 mL  3 mL Intravenous Q12H Crosley, Debby, MD   3 mL at 08/17/23 0839   sodium chloride flush (NS) 0.9 % injection 3 mL  3 mL Intravenous PRN Crosley, Debby, MD       tamsulosin (FLOMAX) capsule 0.8 mg  0.8 mg Oral Daily Wouk, Wilfred Curtis, MD   0.8 mg at 08/17/23 1610   traZODone (DESYREL) tablet 50 mg  50 mg Oral QHS PRN Amin, Ankit C, MD       Facility-Administered Medications Ordered in Other Encounters  Medication Dose Route Frequency Provider Last Rate Last Admin   influenza  inactive virus vaccine (FLUZONE/FLUARIX) injection 0.5 mL  0.5 mL Intramuscular Once Joselyn Arrow, MD         Discharge Medications: Please see discharge summary for a list of discharge medications.  Relevant Imaging Results:  Relevant Lab Results:   Additional Information SS# [id number]  Michaela Corner, LCSWA

## 2023-08-17 NOTE — Progress Notes (Addendum)
PHARMACY - ANTICOAGULATION CONSULT NOTE  Pharmacy Consult for Warfarin >> apixaban Indication: atrial fibrillation  Allergies  Allergen Reactions   Tramadol Itching    Patient Measurements: Height: 5\' 4"  (162.6 cm) Weight: 83.6 kg (184 lb 4.9 oz) IBW/kg (Calculated) : 59.2  Vital Signs: Temp: 97.8 F (36.6 C) (12/05 0745) Temp Source: Oral (12/05 0745) BP: 108/52 (12/05 0833) Pulse Rate: 48 (12/05 0745)  Labs: Recent Labs    08/15/23 0303 08/16/23 0303 08/16/23 1008 08/17/23 0240  HGB  --  9.7*  --  9.1*  HCT  --  32.4*  --  31.3*  PLT  --  113*  --  94*  LABPROT 33.8* 27.5*  --  23.3*  INR 3.3* 2.5*  --  2.0*  CREATININE 1.71* 1.87* 1.88* 1.81*    Estimated Creatinine Clearance: 26.5 mL/min (A) (by C-G formula based on SCr of 1.81 mg/dL (H)).   Medical History: Past Medical History:  Diagnosis Date   A-fib (HCC)    Arthritis    CAD (coronary artery disease)    GXT, neg bruce protocol GXT   Chronic kidney disease    RENAL INSUFFICIENCY   Diverticulosis    ED (erectile dysfunction)    GERD (gastroesophageal reflux disease)    Glaucoma    Gout    no flare in years    Heart disorder    Hypertension    Obesity    Squamous cell carcinoma in situ     Assessment: 89 y/o M on warfarin PTA for afib, presents to the ED s/p fall, CT head negative. Pharmacy consulted to transition from warfarin to Eliquis.  PTA dosing: 2.5mg  daily except 1.25mg  Sunday and Wed At anti-coag appointment on 11/18, patient's INR was elevated to 4.4.   Copay check completed for Eliquis and patient is agreeable. Spoke with Hospitalist, Cardiology, and EP who agree with transition to Eliquis. Will plan to transition to Eliquis 2.5 mg twice daily (dose reduced d/t age, borderline renal function, and admission for fall) once INR is < 2.  12/5: INR 2.0 today. Hgb 9.1, plt 94 low stable.  No signs or symptoms of bleeding Ok to start Eliquis  Goal of Therapy:  INR 2-3 Monitor  platelets by anticoagulation protocol: Yes   Plan:  Start apixaban 2.5mg  BID today Monitor for signs and symptoms of bleeding   Stephenie Acres, PharmD PGY1 Pharmacy Resident 08/17/2023 9:20 AM

## 2023-08-18 ENCOUNTER — Inpatient Hospital Stay (HOSPITAL_COMMUNITY): Payer: Medicare Other

## 2023-08-18 DIAGNOSIS — E8770 Fluid overload, unspecified: Secondary | ICD-10-CM | POA: Diagnosis not present

## 2023-08-18 LAB — MAGNESIUM: Magnesium: 1.9 mg/dL (ref 1.7–2.4)

## 2023-08-18 LAB — CBC
HCT: 33.6 % — ABNORMAL LOW (ref 39.0–52.0)
Hemoglobin: 9.6 g/dL — ABNORMAL LOW (ref 13.0–17.0)
MCH: 28.8 pg (ref 26.0–34.0)
MCHC: 28.6 g/dL — ABNORMAL LOW (ref 30.0–36.0)
MCV: 100.9 fL — ABNORMAL HIGH (ref 80.0–100.0)
Platelets: 107 10*3/uL — ABNORMAL LOW (ref 150–400)
RBC: 3.33 MIL/uL — ABNORMAL LOW (ref 4.22–5.81)
RDW: 16.2 % — ABNORMAL HIGH (ref 11.5–15.5)
WBC: 8.1 10*3/uL (ref 4.0–10.5)
nRBC: 0 % (ref 0.0–0.2)

## 2023-08-18 LAB — BASIC METABOLIC PANEL
Anion gap: 8 (ref 5–15)
BUN: 33 mg/dL — ABNORMAL HIGH (ref 8–23)
CO2: 28 mmol/L (ref 22–32)
Calcium: 8.7 mg/dL — ABNORMAL LOW (ref 8.9–10.3)
Chloride: 101 mmol/L (ref 98–111)
Creatinine, Ser: 1.83 mg/dL — ABNORMAL HIGH (ref 0.61–1.24)
GFR, Estimated: 35 mL/min — ABNORMAL LOW (ref >60)
Glucose, Bld: 104 mg/dL — ABNORMAL HIGH (ref 70–99)
Potassium: 4.7 mmol/L (ref 3.5–5.1)
Sodium: 137 mmol/L (ref 135–145)

## 2023-08-18 MED ORDER — SODIUM CHLORIDE 0.9 % IV SOLN: INTRAVENOUS | Status: AC

## 2023-08-18 NOTE — PMR Pre-admission (Shared)
PMR Admission Coordinator Pre-Admission Assessment  Patient: [name]. is an 89 y.o., male MRN: [id number] DOB: [birth year] Height: 5\' 4"  (162.6 cm) Weight: 83.2 kg              Insurance Information HMO: yes    PPO:      PCP:      IPA:      80/20:      OTHER:  PRIMARY: [employer] Medicare      Policy#: [id number]      Subscriber: pt CM Name: ***      Phone#: [phone]     Fax#: [phone] Pre-Cert#: [id number] ***      Employer:  Benefits:  Phone #: [phone]     Name:  Eff. Date: 09/12/22     Deduct: $0      Out of Pocket Max: $3600 (met $362.90)      Life Max: n/a  CIR: $295/day for days 1-6      SNF: 20 full days Outpatient:      Co-Pay: $20/visit Home Health: 100%      Co-Pay:  DME: 80%     Co-Pay: 20% Providers:  SECONDARY:       Policy#:       Phone#:   Artist:       Phone#:   The Engineer, materials Information Summary" for patients in Inpatient Rehabilitation Facilities with attached "Privacy Act Statement-Health Care Records" was provided and verbally reviewed with: Patient and Family  Emergency Contact Information Contact Information     Name Relation Home Work Mobile   [name] Daughter   [phone]      Other Contacts   None on File    Current Medical History  Patient Admitting Diagnosis: debility   History of Present Illness: [name]. is a 89 y.o. male with a history of CAD, atrial fibrillation on anticoagulation, gout, chronic thrombocytopenia who developed increasing weakness and shortness of breath.  He was admitted on 08/13/2023 to [facility] with fluid overload.  Patient was diuresed and volume status is improving.  Echo showed global hypokinesia and ejection fraction of 40%.  Patient also with acute kidney injury and creatinine up to 1.8.  This is possibly due to diuresis. Therapy evaluations completed and he was recommended for CIR.  Medical management required for CHF, AF on anticoag, AKI, HTN/CAD, and pain control.      Glasgow Coma Scale Score: 14  Patient's medical record from [facility] has been reviewed by the rehabilitation admission coordinator and physician.  Past Medical History  Past Medical History:  Diagnosis Date   A-fib (HCC)    Arthritis    CAD (coronary artery disease)    GXT, neg bruce protocol GXT   Chronic kidney disease    RENAL INSUFFICIENCY   Diverticulosis    ED (erectile dysfunction)    GERD (gastroesophageal reflux disease)    Glaucoma    Gout    no flare in years    Heart disorder    Hypertension    Obesity    Squamous cell carcinoma in situ     Has the patient had major surgery during 100 days prior to admission? No  Family History  family history includes Heart disease in his father and mother; Hypertension in his mother; Kidney disease in his mother; Stroke in his mother.   Current Medications   Current Facility-Administered Medications:    0.9 %  sodium chloride infusion, , Intravenous, Continuous, Amin, Ankit C, MD,  Last Rate: 50 mL/hr at 08/18/23 1032, Restarted at 08/18/23 1032   acetaminophen (TYLENOL) tablet 650 mg, 650 mg, Oral, Q4H PRN, Joneen Roach, Debby, MD   allopurinol (ZYLOPRIM) tablet 100 mg, 100 mg, Oral, Daily, Wouk, Wilfred Curtis, MD, 100 mg at 08/18/23 0901   apixaban (ELIQUIS) tablet 2.5 mg, 2.5 mg, Oral, BID, Jacklynn Barnacle, RPH, 2.5 mg at 08/18/23 0901   carvedilol (COREG) tablet 3.125 mg, 3.125 mg, Oral, BID WC, Crosley, Debby, MD, 3.125 mg at 08/18/23 0901   finasteride (PROSCAR) tablet 5 mg, 5 mg, Oral, Daily, Crosley, Debby, MD, 5 mg at 08/18/23 0901   guaiFENesin (ROBITUSSIN) 100 MG/5ML liquid 5 mL, 5 mL, Oral, Q4H PRN, Amin, Ankit C, MD   hydrALAZINE (APRESOLINE) injection 10 mg, 10 mg, Intravenous, Q4H PRN, Amin, Ankit C, MD   ipratropium-albuterol (DUONEB) 0.5-2.5 (3) MG/3ML nebulizer solution 3 mL, 3 mL, Nebulization, Q4H PRN, Amin, Ankit C, MD   latanoprost (XALATAN) 0.005 % ophthalmic solution 1 drop, 1 drop, Both Eyes, QHS, Wouk,  Wilfred Curtis, MD, 1 drop at 08/17/23 2155   metoprolol tartrate (LOPRESSOR) injection 5 mg, 5 mg, Intravenous, Q4H PRN, Amin, Ankit C, MD   nystatin cream (MYCOSTATIN), , Topical, BID, Crosley, Debby, MD, Given at 08/18/23 0901   OLANZapine (ZYPREXA) injection 5 mg, 5 mg, Intramuscular, Q6H PRN, Amin, Ankit C, MD   ondansetron (ZOFRAN) injection 4 mg, 4 mg, Intravenous, Q6H PRN, Crosley, Debby, MD   senna-docusate (Senokot-S) tablet 1 tablet, 1 tablet, Oral, QHS PRN, Amin, Ankit C, MD   simvastatin (ZOCOR) tablet 20 mg, 20 mg, Oral, QHS, Crosley, Debby, MD, 20 mg at 08/17/23 2155   sodium chloride flush (NS) 0.9 % injection 3 mL, 3 mL, Intravenous, Q12H, Crosley, Debby, MD, 3 mL at 08/18/23 0901   sodium chloride flush (NS) 0.9 % injection 3 mL, 3 mL, Intravenous, PRN, Crosley, Debby, MD   tamsulosin (FLOMAX) capsule 0.8 mg, 0.8 mg, Oral, Daily, Wouk, Wilfred Curtis, MD, 0.8 mg at 08/18/23 0901   traZODone (DESYREL) tablet 50 mg, 50 mg, Oral, QHS PRN, Amin, Ankit C, MD  Facility-Administered Medications Ordered in Other Encounters:    influenza  inactive virus vaccine (FLUZONE/FLUARIX) injection 0.5 mL, 0.5 mL, Intramuscular, Once, Joselyn Arrow, MD  Patients Current Diet:  Diet Order             Diet Heart Room service appropriate? Yes; Fluid consistency: Honey Thick  Diet effective now                   Precautions / Restrictions Precautions Precautions: Fall Restrictions Weight Bearing Restrictions: No   Has the patient had 2 or more falls or a fall with injury in the past year?Yes  Prior Activity Level Limited Community (1-2x/wk): independent with RW, assist for ADLs from daughter, not driving  Prior Functional Level Prior Function Prior Level of Function : History of Falls (last six months), Independent/Modified Independent Mobility Comments: ind with RW ADLs Comments: Ind, daughter assists with driving and iADLs  Self Care: Did the patient need help bathing, dressing,  using the toilet or eating?  Needed some help  Indoor Mobility: Did the patient need assistance with walking from room to room (with or without device)? Independent  Stairs: Did the patient need assistance with internal or external stairs (with or without device)? Needed some help  Functional Cognition: Did the patient need help planning regular tasks such as shopping or remembering to take medications? Needed some help  Patient Information Are  you of Hispanic, Latino/a,or Spanish origin?: A. No, not of Hispanic, Latino/a, or Spanish origin What is your race?: A. White Do you need or want an interpreter to communicate with a doctor or health care staff?: 0. No  Patient's Response To:  Health Literacy and Transportation Is the patient able to respond to health literacy and transportation needs?: Yes Health Literacy - How often do you need to have someone help you when you read instructions, pamphlets, or other written material from your doctor or pharmacy?: Sometimes In the past 12 months, has lack of transportation kept you from medical appointments or from getting medications?: No In the past 12 months, has lack of transportation kept you from meetings, work, or from getting things needed for daily living?: No  Journalist, newspaper / Equipment Home Equipment: Information systems manager, Agricultural consultant (2 wheels), Hand held shower head, Grab bars - tub/shower, Grab bars - toilet  Prior Device Use: Indicate devices/aids used by the patient prior to current illness, exacerbation or injury? Walker  Current Functional Level Cognition  Overall Cognitive Status: Impaired/Different from baseline Current Attention Level: Focused Orientation Level: Oriented to person, Oriented to place, Oriented to time, Disoriented to situation Following Commands: Follows one step commands inconsistently, Follows one step commands with increased time Safety/Judgement: Decreased awareness of safety, Decreased awareness of  deficits General Comments: Pt more lethargic, needing stimulation to arouse. More awake once sitting EOB. Slow to process cues and initiate. Needs repeated cues for posture and hand placement when using the stedy.    Extremity Assessment (includes Sensation/Coordination)  Upper Extremity Assessment: Overall WFL for tasks assessed  Lower Extremity Assessment: Overall WFL for tasks assessed    ADLs  Overall ADL's : Needs assistance/impaired Eating/Feeding: Independent, Sitting Grooming: Standing, Oral care, Wash/dry face, Contact guard assist Upper Body Bathing: Sitting, Set up Lower Body Bathing: Sitting/lateral leans, Contact guard assist Upper Body Dressing : Sitting, Set up Lower Body Dressing: Maximal assistance, Sitting/lateral leans Lower Body Dressing Details (indicate cue type and reason): Pt uses sockaid at baseline, assist needed to don socks Toilet Transfer: Minimal assistance, Stand-pivot, BSC/3in1, Rolling walker (2 wheels) Toileting- Clothing Manipulation and Hygiene: Maximal assistance, Sit to/from stand Functional mobility during ADLs: Minimal assistance, Rolling walker (2 wheels) General ADL Comments: Educated pt on visual scanning to reduce risk of bumping into objects on L side    Mobility  Overal bed mobility: Needs Assistance Bed Mobility: Supine to Sit Supine to sit: HOB elevated, Total assist, +2 for physical assistance, +2 for safety/equipment Sit to supine: HOB elevated, Used rails, Mod assist General bed mobility comments: Extra time and max multi-modal cues provided to try to get pt to assist with bringing legs off EOB, but no success. Pt needed total assist x2 to bring his legs off EOB and ascend his trunk to sit L EOB    Transfers  Overall transfer level: Needs assistance Equipment used: Ambulation equipment used Transfers: Sit to/from Stand, Bed to chair/wheelchair/BSC Sit to Stand: Mod assist, +2 physical assistance, +2 safety/equipment, Min assist Bed  to/from chair/wheelchair/BSC transfer type:: Via Lift equipment Stand pivot transfers: Min assist Transfer via Lift Equipment: Stedy General transfer comment: Pt more lethargic and not initiating as well today, thus used the stedy for transfer safety and ease. Pt required hand-over-hand guidance for each hand being placed and held on the stedy bar, cues and assist to extend his trunk and neck prior to and during transfers, and modAx2 to power up to stand from EOB 1x and  from recliner 1x, minA to power up to stand from stedy flaps 5x    Ambulation / Gait / Stairs / Wheelchair Mobility  Ambulation/Gait Ambulation/Gait assistance: Contact guard assist, +2 safety/equipment Gait Distance (Feet): 75 Feet Assistive device: Rolling walker (2 wheels) Gait Pattern/deviations: Trunk flexed, Decreased stride length, Step-through pattern General Gait Details: Pt ambulates without bumping into obstacles today but with a very flexed posture, needing repeated verbal and tactile cues to improve his upright posture. As distance progresses and pt appears to fatigue his posture worsens and his speed increases. No LOB, CGA for safety, chair follor for safety Gait velocity: Decreased Gait velocity interpretation: <1.8 ft/sec, indicate of risk for recurrent falls Pre-gait activities: Cued pt to march in place in stedy, but pt not lifting legs, only shifting weight laterally bil with flexed posture Stairs: Yes Stairs assistance: Min assist, Mod assist Stair Management: One rail Right, One rail Left, Step to pattern, Sideways Number of Stairs: 2 General stair comments: Ascends with bil hands on R rail, leading up with his R. Pt needed cues to bring his hands more anteriorly on the rails for a better pull up and anterior weight shift as he tends to lean back a little, minA for balance. Descends with bil hands on L rail, needing modA to control descent and balance.    Posture / Balance Dynamic Sitting Balance Sitting  balance - Comments: Pt leaning posteriorly and to the L, needing modA to shift weight to R and maintain balance sitting statically EOB, > 5 min Balance Overall balance assessment: Needs assistance Sitting-balance support: Feet supported, Bilateral upper extremity supported Sitting balance-Leahy Scale: Poor Sitting balance - Comments: Pt leaning posteriorly and to the L, needing modA to shift weight to R and maintain balance sitting statically EOB, > 5 min Postural control: Posterior lean, Left lateral lean Standing balance support: During functional activity, Bilateral upper extremity supported Standing balance-Leahy Scale: Poor Standing balance comment: UE support and min-modA for static standing, flexed posture and posterior lean noted    Special needs/care consideration N/a     Previous Home Environment (from acute therapy documentation) Living Arrangements: Children (daughter) Available Help at Discharge: Family, Available 24 hours/day Type of Home: House Home Layout: One level Home Access: Stairs to enter Entrance Stairs-Rails: Can reach both, Right, Left Entrance Stairs-Number of Steps: 6 Bathroom Shower/Tub: Health visitor: Standard Home Care Services: No Additional Comments: Pt reports his daughter is a retired Runner, broadcasting/film/video, per notes it says retired Charity fundraiser. pt reports one other fall in the last year.  Discharge Living Setting Plans for Discharge Living Setting: Lives with (comment) (daughter) Type of Home at Discharge: House Discharge Home Layout: One level Discharge Home Access: Stairs to enter Entrance Stairs-Rails: Can reach both Entrance Stairs-Number of Steps: 6 Discharge Bathroom Shower/Tub: Walk-in shower Discharge Bathroom Toilet: Standard Discharge Bathroom Accessibility: Yes How Accessible: Accessible via walker Does the patient have any problems obtaining your medications?: No  Social/Family/Support Systems Anticipated Caregiver: daughter,  Mardene Celeste Anticipated Caregiver's Contact Information: [phone] Ability/Limitations of Caregiver: supervision Caregiver Availability: 24/7 Discharge Plan Discussed with Primary Caregiver: Yes Is Caregiver In Agreement with Plan?: Yes Does Caregiver/Family have Issues with Lodging/Transportation while Pt is in Rehab?: No   Goals Patient/Family Goal for Rehab: PT/OT supervision, SLP supervision Expected length of stay: 8-10 days Additional Information: Discharge plan: home with daughter 24/7 as per baseline Pt/Family Agrees to Admission and willing to participate: Yes Program Orientation Provided & Reviewed with Pt/Caregiver Including Roles  & Responsibilities: Yes  Decrease burden of Care through IP rehab admission: n/a   Possible need for SNF placement upon discharge: Not anticipated.  Plan for d/c home with daughter providing 24/7 supervision, as at baseline   Patient Condition: This patient's condition remains as documented in the consult dated 08/17/23, in which the Rehabilitation Physician determined and documented that the patient's condition is appropriate for intensive rehabilitative care in an inpatient rehabilitation facility. Will admit to inpatient rehab today.  Preadmission Screen Completed By: Estill Dooms, PT, DPT and  Stephania Fragmin, PT, 08/18/2023 4:06 PM ______________________________________________________________________   Discussed status with Dr. Marland Kitchen on***at *** and received approval for admission today.  Admission Coordinator:  Stephania Fragmin, time***/Date***

## 2023-08-18 NOTE — Progress Notes (Signed)
Physical Therapy Treatment Patient Details Name: [name]. MRN: [id number] DOB: 08/17/1931 Today's Date: 08/18/2023   History of Present Illness 78 male who presents 08/13/23 after a witnessed fall getting out of his car. Pt sustained no LOC, CT head negative. Patient's daughter states that her daughter who is a nurse told her it looked like there was fluid on his lungs, being worked up at [facility] for fluid overload. PMH afib, artritis, CAD, CKD, Diverticulosis, Gout, Glaucoma, HTN.    PT Comments  Pt has had a drastic functional decline. He is much more lethargic this date. RN notified MD. He required total assist x2 to transition supine to sit EOB due to lethargy, weakness, and poor attention/command following/initiation. He required modAx2 to power up to stand from lower surfaces using the stedy, but was able to stand from the stedy flaps with minA. Pt was unable to step in place when cued, thus unable to attempt to progress gait at this time either. He continues to maintain a very flexed posture, even at rest. Attempted to improve pt's lower extremity strength and trunk and neck extension strength and ROM through serial sit <> stands and scapular retraction with cervical and trunk extension A/PROM throughout the session. Will continue to follow acutely.    If plan is discharge home, recommend the following: Assistance with cooking/housework;Assist for transportation;Help with stairs or ramp for entrance;Supervision due to cognitive status;A lot of help with walking and/or transfers;A lot of help with bathing/dressing/bathroom;Direct supervision/assist for medications management;Direct supervision/assist for financial management   Can travel by private vehicle        Equipment Recommendations  Other (comment) (TBA further pending progress)    Recommendations for Other Services       Precautions / Restrictions Precautions Precautions: Fall Restrictions Weight Bearing Restrictions: No      Mobility  Bed Mobility Overal bed mobility: Needs Assistance Bed Mobility: Supine to Sit     Supine to sit: HOB elevated, Total assist, +2 for physical assistance, +2 for safety/equipment     General bed mobility comments: Extra time and max multi-modal cues provided to try to get pt to assist with bringing legs off EOB, but no success. Pt needed total assist x2 to bring his legs off EOB and ascend his trunk to sit L EOB    Transfers Overall transfer level: Needs assistance Equipment used: Ambulation equipment used Transfers: Sit to/from Stand, Bed to chair/wheelchair/BSC Sit to Stand: Mod assist, +2 physical assistance, +2 safety/equipment, Min assist           General transfer comment: Pt more lethargic and not initiating as well today, thus used the stedy for transfer safety and ease. Pt required hand-over-hand guidance for each hand being placed and held on the stedy bar, cues and assist to extend his trunk and neck prior to and during transfers, and modAx2 to power up to stand from EOB 1x and from recliner 1x, minA to power up to stand from stedy flaps 5x Transfer via Lift Equipment: Stedy  Ambulation/Gait             Pre-gait activities: Cued pt to march in place in stedy, but pt not lifting legs, only shifting weight laterally bil with flexed posture     Stairs             Wheelchair Mobility     Tilt Bed    Modified Rankin (Stroke Patients Only)       Balance Overall balance assessment: Needs assistance  Sitting-balance support: Feet supported, Bilateral upper extremity supported Sitting balance-Leahy Scale: Poor Sitting balance - Comments: Pt leaning posteriorly and to the L, needing modA to shift weight to R and maintain balance sitting statically EOB, > 5 min Postural control: Posterior lean, Left lateral lean Standing balance support: During functional activity, Bilateral upper extremity supported Standing balance-Leahy Scale:  Poor Standing balance comment: UE support and min-modA for static standing, flexed posture and posterior lean noted                            Cognition Arousal: Lethargic Behavior During Therapy: WFL for tasks assessed/performed Overall Cognitive Status: Impaired/Different from baseline Area of Impairment: Attention, Memory, Following commands, Safety/judgement, Awareness, Problem solving                   Current Attention Level: Focused Memory: Decreased short-term memory Following Commands: Follows one step commands inconsistently, Follows one step commands with increased time Safety/Judgement: Decreased awareness of safety, Decreased awareness of deficits Awareness: Emergent Problem Solving: Slow processing, Decreased initiation, Difficulty sequencing, Requires verbal cues, Requires tactile cues General Comments: Pt more lethargic, needing stimulation to arouse. More awake once sitting EOB. Slow to process cues and initiate. Needs repeated cues for posture and hand placement when using the stedy.        Exercises Other Exercises Other Exercises: seated scapular restraction and cervical and trunk extension AROM and PROM, >8 min total during session Other Exercises: sit <> stand 5x from stedy flaps    General Comments General comments (skin integrity, edema, etc.): VSS on supplemental O2 via [place]      Pertinent Vitals/Pain Pain Assessment Pain Assessment: Faces Faces Pain Scale: Hurts little more Pain Location: legs, back and neck with stretching Pain Descriptors / Indicators: Discomfort, Grimacing, Guarding Pain Intervention(s): Monitored during session, Repositioned, Limited activity within patient's tolerance    Home Living                          Prior Function            PT Goals (current goals can now be found in the care plan section) Acute Rehab PT Goals Patient Stated Goal: to improve PT Goal Formulation: With patient/family Time  For Goal Achievement: 08/28/23 Potential to Achieve Goals: Fair Progress towards PT goals: Not progressing toward goals - comment (functional decline)    Frequency    Min 1X/week      PT Plan      Co-evaluation              AM-PAC PT "6 Clicks" Mobility   Outcome Measure  Help needed turning from your back to your side while in a flat bed without using bedrails?: Total Help needed moving from lying on your back to sitting on the side of a flat bed without using bedrails?: Total Help needed moving to and from a bed to a chair (including a wheelchair)?: Total Help needed standing up from a chair using your arms (e.g., wheelchair or bedside chair)?: Total Help needed to walk in hospital room?: Total Help needed climbing 3-5 steps with a railing? : Total 6 Click Score: 6    End of Session Equipment Utilized During Treatment: Gait belt;Oxygen Activity Tolerance: Patient limited by lethargy Patient left: with call bell/phone within reach;in chair;with family/visitor present Nurse Communication: Mobility status;Need for lift equipment (stedy or maximove if pt very lethargic, no chair  alarm in room, family educated to lift his legs in the recliner if they leave) PT Visit Diagnosis: Other abnormalities of gait and mobility (R26.89);Unsteadiness on feet (R26.81);Muscle weakness (generalized) (M62.81);History of falling (Z91.81);Difficulty in walking, not elsewhere classified (R26.2)     Time: 0347-4259 PT Time Calculation (min) (ACUTE ONLY): 36 min  Charges:    $Therapeutic Exercise: 8-22 mins $Therapeutic Activity: 8-22 mins PT General Charges $$ ACUTE PT VISIT: 1 Visit                     Virgil Benedict, PT, DPT Acute Rehabilitation Services  Office: [phone]    Bettina Gavia 08/18/2023, 12:41 PM

## 2023-08-18 NOTE — Plan of Care (Signed)
  Problem: Clinical Measurements: Goal: Will remain free from infection Outcome: Progressing Goal: Diagnostic test results will improve Outcome: Progressing Goal: Respiratory complications will improve Outcome: Progressing Goal: Cardiovascular complication will be avoided Outcome: Progressing   

## 2023-08-18 NOTE — Progress Notes (Signed)
Inpatient Rehab Admissions Coordinator:   Awaiting determination from [facility] regarding CIR prior auth request.  Will follow.   Estill Dooms, PT, DPT Admissions Coordinator [phone] 08/18/23  10:28 AM

## 2023-08-18 NOTE — Progress Notes (Signed)
Mobility Specialist Progress Note:    08/18/23 1325  Mobility  Activity Transferred from chair to bed  Level of Assistance +2 (takes two people) (ModA)  Assistive Device Stedy  Activity Response Tolerated well  Mobility Referral Yes  Mobility visit 1 Mobility  Mobility Specialist Start Time (ACUTE ONLY) 1209  Mobility Specialist Stop Time (ACUTE ONLY) 1220  Mobility Specialist Time Calculation (min) (ACUTE ONLY) 11 min   Pt received in chair agreeable to mobility. Required ModA+2 to transfer to the bed w/ the stedy. No c/o throughout. Needed multimodal cues to maintain posture. Transferred to bed w/ fault. All needs met w/ call bell and personal belongings in reach. RN and NT in room.  Thompson Grayer Mobility Specialist  Please contact vis Secure Chat or  Rehab Office [phone]

## 2023-08-18 NOTE — Progress Notes (Signed)
PROGRESS NOTE    [name].  ONG:[id number] DOB: 1931/05/13 DOA: 08/13/2023 PCP: Ronnald Nian, MD    Brief Narrative:  89 y/o male w/ PMHx of CAD, chronic thrombocytopenia, HLD, HTN, permanent A-fib, chronic anticoagulation, sp PPM, history of gout. She follows with [facility] cardiology. At baseline patient weak, uses a walker for ambulation.  On the day of admission had a fall which was witnessed without loss of consciousness or evidence of seizure.  Upon admission noted to have some evidence of volume overload.  Hospital course course complicated by likely overdiuresis causing AKI requiring brief course of IV fluid.  In the meantime encourage oral hydration.  Eventually PT recommended CIR therefore inpatient rehab team consulted   Assessment & Plan:  Principal Problem:   Fluid overload Active Problems:   Hyperlipidemia LDL goal <70   Primary hypertension   Long term (current) use of anticoagulants   CAD (coronary artery disease)   Permanent atrial fibrillation (HCC)   Fall at home, initial encounter     Acute congestive heart failure with reduced EF, 40% --Was diuresed.  Appears euvolemic but due to rising creatinine patient is now getting gentle hydration.  Echo shows reduced EF with global hypokinesia.  EF is 40%.  He needs to aggressively use incentive spirometer. -Continue Coreg, statin. -Cardiology outpatient follow up  AKI; Baseline 1.3 -Creatinine still remains around 1.8, slightly poor oral intake.  Normal saline 50 cc/h for 20 hours  Dysphagia - Seen by speech and swallow therapy.  He is at moderate aspiration risk.  Recommending regular and thin liquids for now.  Considering MBS   Chronic lower extremity skin changes secondary to venous stasis. -Picture in the media tab reviewed.  No clear evidence of infection.  Hold off on antibiotics   Intertrigo in moist folds/groin -Nystatin cream   Essential hypertension -Continue Coreg.   IV as needed   Physical  deconditioning with fall Right knee pain -Trauma workup is negative.  PT/OT-home health -Right knee x-ray negative for any acute pathology.  Intact arthroplasty     CAD (coronary artery disease), no chest pain -Coreg, simvastatin, Coreg resumed    Thrombocytopenia - chronic -Baseline stable   Hyperlipidemia LDL goal <70 -Simvastatin resumed   Permanent atrial fibrillation (HCC) History of symptomatic bradycardia status post pacemaker Chronic anticoagulation -Coumadin resumed, pharmacy to dose.  Continue Coreg   Gout -Resume allopurinol   BPH - home flomax and finasteride  PT/OT CIR Patient's overall evaluation   DVT prophylaxis: home warfarin Code Status: dnr Family Communication: Daughter at bedside  Remains inpatient appropriate because: Hopefully CIR soon   Subjective: Seen at bedside, feels tired this morning no other complaints Somewhat poor p.o. intake in last 24 hours  Examination:  General exam: Appears calm and comfortable; elderly frail  Respiratory system: Mild bibasilar crackles Cardiovascular system: S1 & S2 heard, RRR. No JVD, murmurs, rubs, gallops or clicks. No pedal edema. Gastrointestinal system: Abdomen is nondistended, soft and nontender. No organomegaly or masses felt. Normal bowel sounds heard. Central nervous system: Alert and oriented. No focal neurological deficits. Extremities: Symmetric 5 x 5 power. Skin: chronic b/l le skin changes.  Psychiatry: Judgement and insight appear normal. Mood & affect appropriate.                Diet Orders (From admission, onward)     Start     Ordered   08/13/23 2130  Diet Heart Room service appropriate? Yes; Fluid consistency: Thin  Diet effective  now       Question Answer Comment  Room service appropriate? Yes   Fluid consistency: Thin      08/13/23 2131            Objective: Vitals:   08/18/23 0244 08/18/23 0607 08/18/23 0806 08/18/23 0937  BP:  117/70 113/73   Pulse: 71 68  69 60  Resp: (!) 21 (!) 23 19   Temp:  97.9 F (36.6 C) 97.9 F (36.6 C)   TempSrc:  Oral Oral   SpO2: 100% 92% 98% 91%  Weight: 83.2 kg     Height:        Intake/Output Summary (Last 24 hours) at 08/18/2023 1132 Last data filed at 08/17/2023 1844 Gross per 24 hour  Intake 180 ml  Output --  Net 180 ml   Filed Weights   08/16/23 0438 08/17/23 0500 08/18/23 0244  Weight: 85.4 kg 83.6 kg 83.2 kg    Scheduled Meds:  allopurinol  100 mg Oral Daily   apixaban  2.5 mg Oral BID   carvedilol  3.125 mg Oral BID WC   finasteride  5 mg Oral Daily   latanoprost  1 drop Both Eyes QHS   nystatin cream   Topical BID   simvastatin  20 mg Oral QHS   sodium chloride flush  3 mL Intravenous Q12H   tamsulosin  0.8 mg Oral Daily   Continuous Infusions:  sodium chloride 50 mL/hr at 08/18/23 1032    Nutritional status     Body mass index is 31.48 kg/m.  Data Reviewed:   CBC: Recent Labs  Lab 08/13/23 1644 08/13/23 1653 08/14/23 0505 08/16/23 0303 08/17/23 0240 08/18/23 0232  WBC 6.6  --  7.1 6.1 6.2 8.1  NEUTROABS  --   --  5.0  --   --   --   HGB 10.9* 12.6* 10.4* 9.7* 9.1* 9.6*  HCT 37.2* 37.0* 34.8* 32.4* 31.3* 33.6*  MCV 99.7  --  98.0 99.1 99.4 100.9*  PLT 131*  --  125* 113* 94* 107*   Basic Metabolic Panel: Recent Labs  Lab 08/14/23 0505 08/15/23 0303 08/16/23 0303 08/16/23 1008 08/17/23 0240 08/18/23 0232  NA 144 142 139 138 137 137  K 4.4 4.3 4.2 4.1 4.3 4.7  CL 106 107 104 102 102 101  CO2 28 29 29 30 28 28   GLUCOSE 91 103* 119* 104* 107* 104*  BUN 25* 25* 28* 28* 31* 33*  CREATININE 1.38* 1.71* 1.87* 1.88* 1.81* 1.83*  CALCIUM 9.2 8.9 8.5* 8.4* 8.5* 8.7*  MG 1.7 1.8 1.8  --  1.9 1.9  PHOS  --   --  4.0  --   --   --    GFR: Estimated Creatinine Clearance: 26.1 mL/min (A) (by C-G formula based on SCr of 1.83 mg/dL (H)). Liver Function Tests: Recent Labs  Lab 08/13/23 1644  AST 27  ALT 12  ALKPHOS 86  BILITOT 1.1  PROT 6.0*  ALBUMIN 3.2*    No results for input(s): "LIPASE", "AMYLASE" in the last 168 hours. No results for input(s): "AMMONIA" in the last 168 hours. Coagulation Profile: Recent Labs  Lab 08/13/23 1644 08/14/23 0505 08/15/23 0303 08/16/23 0303 08/17/23 0240  INR 2.3* 2.5* 3.3* 2.5* 2.0*   Cardiac Enzymes: No results for input(s): "CKTOTAL", "CKMB", "CKMBINDEX", "TROPONINI" in the last 168 hours. BNP (last 3 results) No results for input(s): "PROBNP" in the last 8760 hours. HbA1C: No results for input(s): "HGBA1C" in the last 72  hours. CBG: No results for input(s): "GLUCAP" in the last 168 hours. Lipid Profile: No results for input(s): "CHOL", "HDL", "LDLCALC", "TRIG", "CHOLHDL", "LDLDIRECT" in the last 72 hours. Thyroid Function Tests: No results for input(s): "TSH", "T4TOTAL", "FREET4", "T3FREE", "THYROIDAB" in the last 72 hours. Anemia Panel: No results for input(s): "VITAMINB12", "FOLATE", "FERRITIN", "TIBC", "IRON", "RETICCTPCT" in the last 72 hours. Sepsis Labs: Recent Labs  Lab 08/13/23 1654  LATICACIDVEN 1.6    No results found for this or any previous visit (from the past 240 hour(s)).       Radiology Studies: No results found.         LOS: 5 days   Time spent= 35 mins    Miguel Rota, MD [facility]  If 7PM-7AM, please contact night-coverage  08/18/2023, 11:32 AM

## 2023-08-18 NOTE — Progress Notes (Signed)
Modified Barium Swallow Study  Patient Details  Name: [name]. MRN: [id number] Date of Birth: 1931/06/30  Today's Date: 08/18/2023  Modified Barium Swallow completed.  Full report located under Chart Review in the Imaging Section.  History of Present Illness [name] is a 89 yo male presenting to ED 12/1 with chronic progressive weakness after a witnessed fall. He sustained no LOC, CTH negative. Admitted with volume overload c/b likely over-diuresis causing AKI. PMH includes CAD, chronic thrombocytopenia, HLD, HTN, permanent A-fib on chronic antiocoagulation, s/p PPM, gout, GERD   Clinical Impression Pt with increased lethargy this date and decreased ability to participate compared to previous visit. Pt presents with a significantly impaired oropharyngeal swallow function. He appears to have deficits related to strength and sensation, which are likely further impacted by cognitive factors. Boluses consistently progress posteriorly prior to tongue propulsion, causing the majority to be retained between the valleculae and the pyriform sinuses at the entrance of the open airway. This occurs simultaenously with pt continuing to piecemeal swallow while also attempting to control boluses orally. As pt initiates a swallow response and achieves adeqaute epiglottic inversion and laryngeal elevation, significant quanitities of specifically thin and nectar thick liquids enter the airway and are silently aspirated (PAS 8). Substantial amounts of aspirates were required to elicit any cough response x1, which is weak and congested. Controlled sips of honey thick liquids resulted in trace, transient penetration, which can be considered a normal age variant. Pureed and regular texture solids did not result in penetration/aspiration and resulted in improved control. He is unable to consistently participate in compensatory strategies at this time. Recommend continuing a regular texture diet but  downgrading to honey thick liquids during this time of acute illness. He will require full supervision to monitor for rate control. SLP will continue to follow and he may continue to benefit from intensive f/u upon d/c. Factors that may increase risk of adverse event in presence of aspiration [employer] 2021): Poor general health and/or compromised immunity;Respiratory or GI disease;Reduced cognitive function;Limited mobility;Frail or deconditioned;Weak cough;Frequent aspiration of large volumes  Swallow Evaluation Recommendations Recommendations: PO diet PO Diet Recommendation: Regular;Moderately thick liquids (Level 3, honey thick) Liquid Administration via: Cup;Spoon Medication Administration: Crushed with puree Supervision: Full assist for feeding;Full supervision/cueing for swallowing strategies Swallowing strategies  : Minimize environmental distractions;Slow rate;Small bites/sips Postural changes: Position pt fully upright for meals;Stay upright 30-60 min after meals Oral care recommendations: Oral care QID (4x/day);Staff/trained caregiver to provide oral care      Gwynneth Aliment, M.A., CF-SLP Speech Language Pathology, Acute Rehabilitation Services  Secure Chat preferred [phone]  08/18/2023,2:08 PM

## 2023-08-18 NOTE — Plan of Care (Signed)
?  Problem: Coping: ?Goal: Level of anxiety will decrease ?Outcome: Progressing ?  ?Problem: Safety: ?Goal: Ability to remain free from injury will improve ?Outcome: Progressing ?  ?

## 2023-08-19 ENCOUNTER — Other Ambulatory Visit: Payer: Self-pay | Admitting: Internal Medicine

## 2023-08-19 DIAGNOSIS — E8770 Fluid overload, unspecified: Secondary | ICD-10-CM | POA: Diagnosis not present

## 2023-08-19 LAB — BASIC METABOLIC PANEL
Anion gap: 8 (ref 5–15)
BUN: 36 mg/dL — ABNORMAL HIGH (ref 8–23)
CO2: 25 mmol/L (ref 22–32)
Calcium: 7.9 mg/dL — ABNORMAL LOW (ref 8.9–10.3)
Chloride: 105 mmol/L (ref 98–111)
Creatinine, Ser: 1.82 mg/dL — ABNORMAL HIGH (ref 0.61–1.24)
GFR, Estimated: 35 mL/min — ABNORMAL LOW (ref >60)
Glucose, Bld: 103 mg/dL — ABNORMAL HIGH (ref 70–99)
Potassium: 4.1 mmol/L (ref 3.5–5.1)
Sodium: 138 mmol/L (ref 135–145)

## 2023-08-19 LAB — MAGNESIUM: Magnesium: 1.7 mg/dL (ref 1.7–2.4)

## 2023-08-19 LAB — CBC
HCT: 29.4 % — ABNORMAL LOW (ref 39.0–52.0)
Hemoglobin: 8.6 g/dL — ABNORMAL LOW (ref 13.0–17.0)
MCH: 29.7 pg (ref 26.0–34.0)
MCHC: 29.3 g/dL — ABNORMAL LOW (ref 30.0–36.0)
MCV: 101.4 fL — ABNORMAL HIGH (ref 80.0–100.0)
Platelets: 92 10*3/uL — ABNORMAL LOW (ref 150–400)
RBC: 2.9 MIL/uL — ABNORMAL LOW (ref 4.22–5.81)
RDW: 16 % — ABNORMAL HIGH (ref 11.5–15.5)
WBC: 6.5 10*3/uL (ref 4.0–10.5)
nRBC: 0 % (ref 0.0–0.2)

## 2023-08-19 MED ORDER — LORAZEPAM 2 MG/ML PO CONC: 1.0000 mg | ORAL | Status: DC | PRN

## 2023-08-19 MED ORDER — BACLOFEN 10 MG PO TABS: 5.0000 mg | ORAL_TABLET | Freq: Two times a day (BID) | ORAL | Status: DC | PRN

## 2023-08-19 MED ORDER — OXYBUTYNIN CHLORIDE 5 MG PO TABS: 2.5000 mg | ORAL_TABLET | Freq: Four times a day (QID) | ORAL | Status: DC | PRN

## 2023-08-19 MED ORDER — LORAZEPAM 1 MG PO TABS: 1.0000 mg | ORAL_TABLET | ORAL | Status: DC | PRN

## 2023-08-19 MED ORDER — LORAZEPAM 2 MG/ML IJ SOLN: 1.0000 mg | INTRAMUSCULAR | Status: DC | PRN

## 2023-08-19 MED ORDER — ATROPINE SULFATE 1 % OP SOLN: 4.0000 [drp] | OPHTHALMIC | Status: DC | PRN

## 2023-08-19 MED ORDER — POLYVINYL ALCOHOL 1.4 % OP SOLN: 1.0000 [drp] | Freq: Four times a day (QID) | OPHTHALMIC | Status: DC | PRN

## 2023-08-19 MED ORDER — MAGIC MOUTHWASH: 15.0000 mL | Freq: Four times a day (QID) | ORAL | Status: DC | PRN

## 2023-08-19 NOTE — Plan of Care (Signed)
  Problem: Education: Goal: Knowledge of General Education information will improve Description: Including pain rating scale, medication(s)/side effects and non-pharmacologic comfort measures Outcome: Progressing   Problem: Clinical Measurements: Goal: Ability to maintain clinical measurements within normal limits will improve Outcome: Progressing Goal: Will remain free from infection Outcome: Progressing Goal: Diagnostic test results will improve Outcome: Progressing Goal: Cardiovascular complication will be avoided Outcome: Progressing   Problem: Elimination: Goal: Will not experience complications related to bowel motility Outcome: Progressing   Problem: Safety: Goal: Ability to remain free from injury will improve Outcome: Progressing   Problem: Health Behavior/Discharge Planning: Goal: Ability to manage health-related needs will improve Outcome: Not Progressing   Problem: Clinical Measurements: Goal: Respiratory complications will improve Outcome: Not Progressing   Problem: Activity: Goal: Risk for activity intolerance will decrease Outcome: Not Progressing   Problem: Nutrition: Goal: Adequate nutrition will be maintained Outcome: Not Progressing   Problem: Coping: Goal: Level of anxiety will decrease Outcome: Not Progressing   Problem: Skin Integrity: Goal: Risk for impaired skin integrity will decrease Outcome: Not Progressing

## 2023-08-19 NOTE — Progress Notes (Signed)
PROGRESS NOTE    [place].  WUJ:[id number] DOB: 04-12-1931 DOA: 08/13/2023 PCP: Ronnald Nian, MD    Brief Narrative:  89 y/o male w/ PMHx of CAD, chronic thrombocytopenia, HLD, HTN, permanent A-fib, chronic anticoagulation, sp PPM, history of gout. She follows with [facility] cardiology. At baseline patient weak, uses a walker for ambulation.  On the day of admission had a fall which was witnessed without loss of consciousness or evidence of seizure.  Upon admission noted to have some evidence of volume overload.  Hospital course course complicated by likely overdiuresis causing AKI requiring brief course of IV fluid.  In the meantime encourage oral hydration.  Initially PT recommended CIR but patient continued to decline with lack of desire to eat drink and participate in therapy.  Now transition to comfort care.   Assessment & Plan:  Principal Problem:   Fluid overload Active Problems:   Hyperlipidemia LDL goal <70   Primary hypertension   Long term (current) use of anticoagulants   CAD (coronary artery disease)   Permanent atrial fibrillation (HCC)   Fall at home, initial encounter     Acute congestive heart failure with reduced EF, 40% --Patient was initially diuresed but thereafter went into mild AKI.  Getting gentle hydration as necessary but for now we will stop all the further intervention as family is interested in hospice.  AKI; Baseline 1.3 -Despite of IV fluids, his p.o. intake remains very poor.  Creatinine remains around 1.8.  Family no longer wishing to pursue further IV fluids and further lab work which I agree with.  Dysphagia - MBS performed, shows chronic dysphagia.  Will place him on regular diet without any restrictions   Chronic lower extremity skin changes secondary to venous stasis. Supportive care   Intertrigo in moist folds/groin -Nystatin cream   Essential hypertension Discontinue Coreg   Physical deconditioning with fall Right knee  pain -Trauma workup is negative.  PT/OT-home health -Right knee x-ray negative for any acute pathology.  Intact arthroplasty     CAD (coronary artery disease), no chest pain Discontinue Coreg and statin    Thrombocytopenia - chronic -Baseline stable   Hyperlipidemia LDL goal <70 -Simvastatin discontinued   Permanent atrial fibrillation (HCC) History of symptomatic bradycardia status post pacemaker Chronic anticoagulation Discontinue Coumadin and Coreg   Gout -Resume allopurinol   BPH - home flomax and finasteride  At this time family is interested in hospice.  Will consult hospice liaison.  I agree that patient overall now has very poor prognosis especially his advanced age, lack of desire to eat and drink and participate in therapy.    DVT prophylaxis: None Code Status: dnr Family Communication: Family at bedside  Ongoing hospice evaluation   Subjective: Seen and examined at bedside.  Family is also present at bedside Patient is extremely tired and most the day remains lethargic.  Very poor oral intake.  Is also had very poor breathing effort.  He has expressed desire to family that he does not further wish to pursue any aggressive care and that he is ready to transition. Family interested in discussing hospice.  We also discussed to hold off on any further lab draws, telemetry monitoring, any escalation of care and discontinuing unnecessary medications.  Examination:  General exam: Lethargic, elderly, frail on 4 L nasal cannula  Respiratory system: Mild bibasilar crackles Cardiovascular system: S1 & S2 heard, RRR. No JVD, murmurs, rubs, gallops or clicks. No pedal edema. Gastrointestinal system: Abdomen is nondistended, soft and  nontender. No organomegaly or masses felt. Normal bowel sounds heard. Central nervous system: Alert and oriented. No focal neurological deficits. Extremities: Symmetric 5 x 5 power. Skin: chronic b/l le skin changes.  Psychiatry: Judgement  and insight appear poor                Diet Orders (From admission, onward)     Start     Ordered   08/19/23 1118  Diet regular Room service appropriate? Yes; Fluid consistency: Thin  Diet effective now       Question Answer Comment  Room service appropriate? Yes   Fluid consistency: Thin      08/19/23 1117            Objective: Vitals:   08/18/23 1700 08/18/23 1935 08/19/23 0614 08/19/23 0810  BP: 125/71 (!) 116/51 113/60 109/65  Pulse: 75 65 60 (!) 54  Resp: 20 18 18  (!) 25  Temp: 97.9 F (36.6 C) 97.8 F (36.6 C) 97.8 F (36.6 C) 98.1 F (36.7 C)  TempSrc: Oral Oral Oral Axillary  SpO2: 100% 96% 92% 95%  Weight:   83.7 kg   Height:        Intake/Output Summary (Last 24 hours) at 08/19/2023 1122 Last data filed at 08/19/2023 0900 Gross per 24 hour  Intake 904.32 ml  Output --  Net 904.32 ml   Filed Weights   08/17/23 0500 08/18/23 0244 08/19/23 0614  Weight: 83.6 kg 83.2 kg 83.7 kg    Scheduled Meds:  finasteride  5 mg Oral Daily   latanoprost  1 drop Both Eyes QHS   nystatin cream   Topical BID   sodium chloride flush  3 mL Intravenous Q12H   tamsulosin  0.8 mg Oral Daily   Continuous Infusions:  Nutritional status     Body mass index is 31.67 kg/m.  Data Reviewed:   CBC: Recent Labs  Lab 08/14/23 0505 08/16/23 0303 08/17/23 0240 08/18/23 0232 08/19/23 0219  WBC 7.1 6.1 6.2 8.1 6.5  NEUTROABS 5.0  --   --   --   --   HGB 10.4* 9.7* 9.1* 9.6* 8.6*  HCT 34.8* 32.4* 31.3* 33.6* 29.4*  MCV 98.0 99.1 99.4 100.9* 101.4*  PLT 125* 113* 94* 107* 92*   Basic Metabolic Panel: Recent Labs  Lab 08/15/23 0303 08/16/23 0303 08/16/23 1008 08/17/23 0240 08/18/23 0232 08/19/23 0219  NA 142 139 138 137 137 138  K 4.3 4.2 4.1 4.3 4.7 4.1  CL 107 104 102 102 101 105  CO2 29 29 30 28 28 25   GLUCOSE 103* 119* 104* 107* 104* 103*  BUN 25* 28* 28* 31* 33* 36*  CREATININE 1.71* 1.87* 1.88* 1.81* 1.83* 1.82*  CALCIUM 8.9 8.5* 8.4* 8.5*  8.7* 7.9*  MG 1.8 1.8  --  1.9 1.9 1.7  PHOS  --  4.0  --   --   --   --    GFR: Estimated Creatinine Clearance: 26.3 mL/min (A) (by C-G formula based on SCr of 1.82 mg/dL (H)). Liver Function Tests: Recent Labs  Lab 08/13/23 1644  AST 27  ALT 12  ALKPHOS 86  BILITOT 1.1  PROT 6.0*  ALBUMIN 3.2*   No results for input(s): "LIPASE", "AMYLASE" in the last 168 hours. No results for input(s): "AMMONIA" in the last 168 hours. Coagulation Profile: Recent Labs  Lab 08/13/23 1644 08/14/23 0505 08/15/23 0303 08/16/23 0303 08/17/23 0240  INR 2.3* 2.5* 3.3* 2.5* 2.0*   Cardiac Enzymes: No results for  input(s): "CKTOTAL", "CKMB", "CKMBINDEX", "TROPONINI" in the last 168 hours. BNP (last 3 results) No results for input(s): "PROBNP" in the last 8760 hours. HbA1C: No results for input(s): "HGBA1C" in the last 72 hours. CBG: No results for input(s): "GLUCAP" in the last 168 hours. Lipid Profile: No results for input(s): "CHOL", "HDL", "LDLCALC", "TRIG", "CHOLHDL", "LDLDIRECT" in the last 72 hours. Thyroid Function Tests: No results for input(s): "TSH", "T4TOTAL", "FREET4", "T3FREE", "THYROIDAB" in the last 72 hours. Anemia Panel: No results for input(s): "VITAMINB12", "FOLATE", "FERRITIN", "TIBC", "IRON", "RETICCTPCT" in the last 72 hours. Sepsis Labs: Recent Labs  Lab 08/13/23 1654  LATICACIDVEN 1.6    No results found for this or any previous visit (from the past 240 hour(s)).       Radiology Studies: DG Swallowing Func-Speech Pathology  Result Date: 08/18/2023 Table formatting from the original result was not included. Modified Barium Swallow Study Patient Details Name: Adien Bacho. MRN: [id number] Date of Birth: [birth year] Today's Date: 08/18/2023 HPI/PMH: HPI: Detrich Fayer is a 89 yo male presenting to ED 12/1 with chronic progressive weakness after a witnessed fall. He sustained no LOC, CTH negative. Admitted with volume overload c/b likely over-diuresis  causing AKI. PMH includes CAD, chronic thrombocytopenia, HLD, HTN, permanent A-fib on chronic antiocoagulation, s/p PPM, gout, GERD Clinical Impression: Clinical Impression: Pt with increased lethargy this date and decreased ability to participate compared to previous visit. Pt presents with a significantly impaired oropharyngeal swallow function. He appears to have deficits related to strength and sensation, which are likely further impacted by cognitive factors. Boluses consistently progress posteriorly prior to tongue propulsion, causing the majority to be retained between the valleculae and the pyriform sinuses at the entrance of the open airway. This occurs simultaenously with pt continuing to piecemeal swallow while also attempting to control boluses orally. As pt initiates a swallow response and achieves adeqaute epiglottic inversion and laryngeal elevation, significant quanitities of specifically thin and nectar thick liquids enter the airway and are silently aspirated (PAS 8). Substantial amounts of aspirates were required to elicit any cough response x1, which is weak and congested. Controlled sips of honey thick liquids resulted in trace, transient penetration, which can be considered a normal age variant. Pureed and regular texture solids did not result in penetration/aspiration and resulted in improved control. He is unable to consistently participate in compensatory strategies at this time. Recommend continuing a regular texture diet but downgrading to honey thick liquids during this time of acute illness. He will require full supervision to monitor for rate control. SLP will continue to follow and he may continue to benefit from intensive f/u upon d/c. Factors that may increase risk of adverse event in presence of aspiration Rubye Oaks & Clearance Coots 2021): Factors that may increase risk of adverse event in presence of aspiration Rubye Oaks & Clearance Coots 2021): Poor general health and/or compromised immunity;  Respiratory or GI disease; Reduced cognitive function; Limited mobility; Frail or deconditioned; Weak cough; Frequent aspiration of large volumes Recommendations/Plan: Swallowing Evaluation Recommendations Swallowing Evaluation Recommendations Recommendations: PO diet PO Diet Recommendation: Regular; Moderately thick liquids (Level 3, honey thick) Liquid Administration via: Cup; Spoon Medication Administration: Crushed with puree Supervision: Full assist for feeding; Full supervision/cueing for swallowing strategies Swallowing strategies  : Minimize environmental distractions; Slow rate; Small bites/sips Postural changes: Position pt fully upright for meals; Stay upright 30-60 min after meals Oral care recommendations: Oral care QID (4x/day); Staff/trained caregiver to provide oral care Treatment Plan Treatment Plan Treatment recommendations: Therapy as outlined  in treatment plan below Follow-up recommendations: Acute inpatient rehab (3 hours/day) Functional status assessment: Patient has had a recent decline in their functional status and demonstrates the ability to make significant improvements in function in a reasonable and predictable amount of time. Treatment frequency: Min 2x/week Treatment duration: 2 weeks Interventions: Aspiration precaution training; Oropharyngeal exercises; Compensatory techniques; Patient/family education; Trials of upgraded texture/liquids; Diet toleration management by SLP; Respiratory muscle strength training Recommendations Recommendations for follow up therapy are one component of a multi-disciplinary discharge planning process, led by the attending physician.  Recommendations may be updated based on patient status, additional functional criteria and insurance authorization. Assessment: Orofacial Exam: Orofacial Exam Oral Cavity: Oral Hygiene: WFL Oral Cavity - Dentition: Adequate natural dentition Orofacial Anatomy: WFL Oral Motor/Sensory Function: WFL Anatomy: Anatomy: Suspected  cervical osteophytes Boluses Administered: Boluses Administered Boluses Administered: Thin liquids (Level 0); Mildly thick liquids (Level 2, nectar thick); Moderately thick liquids (Level 3, honey thick); Puree  Oral Impairment Domain: Oral Impairment Domain Lip Closure: Escape from interlabial space or lateral juncture, no extension beyond vermillion border Tongue control during bolus hold: Cohesive bolus between tongue to palatal seal Bolus preparation/mastication: Timely and efficient chewing and mashing Bolus transport/lingual motion: Slow tongue motion Oral residue: Trace residue lining oral structures Location of oral residue : Tongue; Palate Initiation of pharyngeal swallow : Pyriform sinuses  Pharyngeal Impairment Domain: Pharyngeal Impairment Domain Soft palate elevation: No bolus between soft palate (SP)/pharyngeal wall (PW) Laryngeal elevation: Partial superior movement of thyroid cartilage/partial approximation of arytenoids to epiglottic petiole Anterior hyoid excursion: Complete anterior movement Epiglottic movement: Complete inversion Laryngeal vestibule closure: Incomplete, narrow column air/contrast in laryngeal vestibule Pharyngeal stripping wave : Present - complete Pharyngeal contraction (A/P view only): N/A Pharyngoesophageal segment opening: Complete distension and complete duration, no obstruction of flow Tongue base retraction: Trace column of contrast or air between tongue base and PPW Pharyngeal residue: Trace residue within or on pharyngeal structures Location of pharyngeal residue: Valleculae; Pharyngeal wall  Esophageal Impairment Domain: No data recorded Pill: No data recorded Penetration/Aspiration Scale Score: Penetration/Aspiration Scale Score 1.  Material does not enter airway: Puree; Solid 2.  Material enters airway, remains ABOVE vocal cords then ejected out: Moderately thick liquids (Level 3, honey thick) 8.  Material enters airway, passes BELOW cords without attempt by patient  to eject out (silent aspiration) : Thin liquids (Level 0); Mildly thick liquids (Level 2, nectar thick) Compensatory Strategies: Compensatory Strategies Compensatory strategies: Yes Straw: Ineffective Ineffective Straw: Thin liquid (Level 0); Mildly thick liquid (Level 2, nectar thick)   General Information: Caregiver present: No  Diet Prior to this Study: Regular; Thin liquids (Level 0)   Temperature : Normal   Respiratory Status: Increased WOB   Supplemental O2: Nasal cannula   History of Recent Intubation: No  Behavior/Cognition: Alert; Cooperative; Requires cueing Self-Feeding Abilities: Dependent for feeding Baseline vocal quality/speech: Normal Volitional Cough: Able to elicit Volitional Swallow: Able to elicit Exam Limitations: No limitations Goal Planning: Prognosis for improved oropharyngeal function: Good Barriers to Reach Goals: Cognitive deficits; Time post onset; Severity of deficits No data recorded Patient/Family Stated Goal: none stated Consulted and agree with results and recommendations: Patient Pain: Pain Assessment Pain Assessment: Faces Faces Pain Scale: 4 Pain Location: legs, back and neck with stretching Pain Descriptors / Indicators: Discomfort; Grimacing; Guarding Pain Intervention(s): Monitored during session End of Session: Start Time:SLP Start Time (ACUTE ONLY): 1222 Stop Time: SLP Stop Time (ACUTE ONLY): 1237 Time Calculation:SLP Time Calculation (min) (ACUTE ONLY): 15 min Charges:  SLP Evaluations $ SLP Speech Visit: 1 Visit SLP Evaluations $BSS Swallow: 1 Procedure $MBS Swallow: 1 Procedure SLP visit diagnosis: SLP Visit Diagnosis: Dysphagia, oropharyngeal phase (R13.12) Past Medical History: Past Medical History: Diagnosis Date  A-fib (HCC)   Arthritis   CAD (coronary artery disease)   GXT, neg bruce protocol GXT  Chronic kidney disease   RENAL INSUFFICIENCY  Diverticulosis   ED (erectile dysfunction)   GERD (gastroesophageal reflux disease)   Glaucoma   Gout   no flare in years    Heart disorder   Hypertension   Obesity   Squamous cell carcinoma in situ  Past Surgical History: Past Surgical History: Procedure Laterality Date  CATARACT EXTRACTION, BILATERAL  2018  with lens placement   PACEMAKER INSERTION  01/09/03  TONSILLECTOMY    TOTAL KNEE ARTHROPLASTY Right 02/27/2018  Procedure: RIGHT TOTAL KNEE ARTHROPLASTY;  Surgeon: Durene Romans, MD;  Location: [facility];  Service: Orthopedics;  Laterality: Right;  70 mins  TOTAL KNEE ARTHROPLASTY Left 09/06/2018  Procedure: LEFT TOTAL KNEE ARTHROPLASTY;  Surgeon: Durene Romans, MD;  Location: [facility];  Service: Orthopedics;  Laterality: Left;  70 mins Gwynneth Aliment, M.A., CF-SLP Speech Language Pathology, Acute Rehabilitation Services Secure Chat preferred [phone] 08/18/2023, 2:37 PM          LOS: 6 days   Time spent= 35 mins    Miguel Rota, MD [facility]  If 7PM-7AM, please contact night-coverage  08/19/2023, 11:22 AM

## 2023-08-19 NOTE — Plan of Care (Signed)
  Problem: Clinical Measurements: Goal: Will remain free from infection Outcome: Progressing Goal: Respiratory complications will improve Outcome: Progressing   Problem: Activity: Goal: Risk for activity intolerance will decrease Outcome: Progressing   

## 2023-08-20 DIAGNOSIS — E8770 Fluid overload, unspecified: Secondary | ICD-10-CM | POA: Diagnosis not present

## 2023-08-20 MED ORDER — GLYCOPYRROLATE 1 MG PO TABS
1.0000 mg | ORAL_TABLET | Freq: Two times a day (BID) | ORAL | Status: DC
  Filled 2023-08-20 (×2): qty 1

## 2023-08-20 MED ORDER — ENSURE ENLIVE PO LIQD
237.0000 mL | Freq: Two times a day (BID) | ORAL | Status: DC
  Administered 2023-08-21: 237 mL via ORAL

## 2023-08-20 MED ORDER — CHLORHEXIDINE GLUCONATE CLOTH 2 % EX PADS
6.0000 | MEDICATED_PAD | Freq: Every day | CUTANEOUS | Status: DC
  Administered 2023-08-20 – 2023-08-21 (×2): 6 via TOPICAL

## 2023-08-20 MED ORDER — GLYCOPYRROLATE 0.2 MG/ML IJ SOLN
0.2000 mg | Freq: Three times a day (TID) | INTRAMUSCULAR | Status: DC
  Administered 2023-08-20 – 2023-08-21 (×3): 0.2 mg via INTRAVENOUS
  Filled 2023-08-20 (×3): qty 1

## 2023-08-20 NOTE — Plan of Care (Signed)
  Problem: Education: Goal: Knowledge of General Education information will improve Description: Including pain rating scale, medication(s)/side effects and non-pharmacologic comfort measures Outcome: Not Progressing   Problem: Health Behavior/Discharge Planning: Goal: Ability to manage health-related needs will improve Outcome: Not Progressing   Problem: Clinical Measurements: Goal: Ability to maintain clinical measurements within normal limits will improve Outcome: Not Progressing Goal: Will remain free from infection Outcome: Not Progressing Goal: Diagnostic test results will improve Outcome: Not Progressing Goal: Respiratory complications will improve Outcome: Not Progressing Goal: Cardiovascular complication will be avoided Outcome: Not Progressing   Problem: Activity: Goal: Risk for activity intolerance will decrease Outcome: Not Progressing   Problem: Nutrition: Goal: Adequate nutrition will be maintained Outcome: Not Progressing   Problem: Coping: Goal: Level of anxiety will decrease Outcome: Not Progressing   Problem: Elimination: Goal: Will not experience complications related to bowel motility Outcome: Not Progressing Goal: Will not experience complications related to urinary retention Outcome: Not Progressing   Problem: Pain Management: Goal: General experience of comfort will improve Outcome: Not Progressing   Problem: Safety: Goal: Ability to remain free from injury will improve Outcome: Not Progressing   Problem: Skin Integrity: Goal: Risk for impaired skin integrity will decrease Outcome: Not Progressing   Problem: Education: Goal: Knowledge of the prescribed therapeutic regimen will improve Outcome: Not Progressing   Problem: Coping: Goal: Ability to identify and develop effective coping behavior will improve Outcome: Not Progressing   Problem: Clinical Measurements: Goal: Quality of life will improve Outcome: Not Progressing   Problem:  Respiratory: Goal: Verbalizations of increased ease of respirations will increase Outcome: Not Progressing   Problem: Role Relationship: Goal: Family's ability to cope with current situation will improve Outcome: Not Progressing Goal: Ability to verbalize concerns, feelings, and thoughts to partner or family member will improve Outcome: Not Progressing   Problem: Pain Management: Goal: Satisfaction with pain management regimen will improve Outcome: Not Progressing

## 2023-08-20 NOTE — Progress Notes (Signed)
PROGRESS NOTE    [place].  ZOX:[id number] DOB: 1931-07-05 DOA: 08/13/2023 PCP: Ronnald Nian, MD    Brief Narrative:  89 y/o male w/ PMHx of CAD, chronic thrombocytopenia, HLD, HTN, permanent A-fib, chronic anticoagulation, sp PPM, history of gout. She follows with [facility] cardiology. At baseline patient weak, uses a walker for ambulation.  On the day of admission had a fall which was witnessed without loss of consciousness or evidence of seizure.  Upon admission noted to have some evidence of volume overload.  Hospital course course complicated by likely overdiuresis causing AKI requiring brief course of IV fluid.  In the meantime encourage oral hydration.  Initially PT recommended CIR but patient continued to decline with lack of desire to eat drink and participate in therapy.  Now transition to comfort care.  TOC consulted to contact hospice liaison.   Assessment & Plan:  Principal Problem:   Fluid overload Active Problems:   Hyperlipidemia LDL goal <70   Primary hypertension   Long term (current) use of anticoagulants   CAD (coronary artery disease)   Permanent atrial fibrillation (HCC)   Fall at home, initial encounter     Acute congestive heart failure with reduced EF, 40% --Patient was initially diuresed but thereafter went into mild AKI.  Getting gentle hydration as necessary but for now we will stop all the further intervention as family is interested in hospice.  TOC consulted to contact hospice liaison  AKI; Baseline 1.3 -Despite of IV fluids, his p.o. intake remains very poor.  Creatinine remains around 1.8.  Family no longer wishing to pursue further IV fluids and further lab work which I agree with.  Dysphagia - MBS performed, shows chronic dysphagia.  Will place him on regular diet without any restrictions   Chronic lower extremity skin changes secondary to venous stasis. Supportive care   Intertrigo in moist folds/groin -Nystatin cream   Essential  hypertension Discontinue Coreg   Physical deconditioning with fall Right knee pain -Trauma workup is negative.  PT/OT-home health -Right knee x-ray negative for any acute pathology.  Intact arthroplasty     CAD (coronary artery disease), no chest pain Discontinue Coreg and statin    Thrombocytopenia - chronic -Baseline stable   Hyperlipidemia LDL goal <70 -Simvastatin discontinued   Permanent atrial fibrillation (HCC) History of symptomatic bradycardia status post pacemaker Chronic anticoagulation Discontinue Coumadin and Coreg   Gout -Resume allopurinol   BPH - home flomax and finasteride  At this time family is interested in hospice.  Will consult hospice liaison.   I agree that patient overall now has very poor prognosis especially his advanced age, lack of desire to eat and drink and participate in therapy.    DVT prophylaxis: None Code Status: dnr Family Communication: Family at bedside  Ongoing hospice evaluation   Subjective: Very poor oral intake over last 24 hours.  No other complaints. Mostly remains drowsy Examination:  General exam: Lethargic, elderly, frail on 4 L nasal cannula  Respiratory system: Mild bibasilar crackles Cardiovascular system: S1 & S2 heard, RRR. No JVD, murmurs, rubs, gallops or clicks. No pedal edema. Gastrointestinal system: Abdomen is nondistended, soft and nontender. No organomegaly or masses felt. Normal bowel sounds heard. Central nervous system: Alert and oriented. No focal neurological deficits. Extremities: Symmetric 5 x 5 power. Skin: chronic b/l le skin changes.  Psychiatry: Judgement and insight appear poor                Diet Orders (From admission,  onward)     Start     Ordered   08/19/23 1118  Diet regular Room service appropriate? Yes; Fluid consistency: Thin  Diet effective now       Question Answer Comment  Room service appropriate? Yes   Fluid consistency: Thin      08/19/23 1117             Objective: Vitals:   08/18/23 1935 08/19/23 0614 08/19/23 0810 08/19/23 1929  BP: (!) 116/51 113/60 109/65   Pulse: 65 60 (!) 54   Resp: 18 18 (!) 25   Temp: 97.8 F (36.6 C) 97.8 F (36.6 C) 98.1 F (36.7 C) 98.4 F (36.9 C)  TempSrc: Oral Oral Axillary Oral  SpO2: 96% 92% 95%   Weight:  83.7 kg    Height:        Intake/Output Summary (Last 24 hours) at 08/20/2023 1137 Last data filed at 08/20/2023 0842 Gross per 24 hour  Intake 10 ml  Output 300 ml  Net -290 ml   Filed Weights   08/17/23 0500 08/18/23 0244 08/19/23 0614  Weight: 83.6 kg 83.2 kg 83.7 kg    Scheduled Meds:  finasteride  5 mg Oral Daily   latanoprost  1 drop Both Eyes QHS   nystatin cream   Topical BID   sodium chloride flush  3 mL Intravenous Q12H   tamsulosin  0.8 mg Oral Daily   Continuous Infusions:  Nutritional status     Body mass index is 31.67 kg/m.  Data Reviewed:   CBC: Recent Labs  Lab 08/14/23 0505 08/16/23 0303 08/17/23 0240 08/18/23 0232 08/19/23 0219  WBC 7.1 6.1 6.2 8.1 6.5  NEUTROABS 5.0  --   --   --   --   HGB 10.4* 9.7* 9.1* 9.6* 8.6*  HCT 34.8* 32.4* 31.3* 33.6* 29.4*  MCV 98.0 99.1 99.4 100.9* 101.4*  PLT 125* 113* 94* 107* 92*   Basic Metabolic Panel: Recent Labs  Lab 08/15/23 0303 08/16/23 0303 08/16/23 1008 08/17/23 0240 08/18/23 0232 08/19/23 0219  NA 142 139 138 137 137 138  K 4.3 4.2 4.1 4.3 4.7 4.1  CL 107 104 102 102 101 105  CO2 29 29 30 28 28 25   GLUCOSE 103* 119* 104* 107* 104* 103*  BUN 25* 28* 28* 31* 33* 36*  CREATININE 1.71* 1.87* 1.88* 1.81* 1.83* 1.82*  CALCIUM 8.9 8.5* 8.4* 8.5* 8.7* 7.9*  MG 1.8 1.8  --  1.9 1.9 1.7  PHOS  --  4.0  --   --   --   --    GFR: Estimated Creatinine Clearance: 26.3 mL/min (A) (by C-G formula based on SCr of 1.82 mg/dL (H)). Liver Function Tests: Recent Labs  Lab 08/13/23 1644  AST 27  ALT 12  ALKPHOS 86  BILITOT 1.1  PROT 6.0*  ALBUMIN 3.2*   No results for input(s): "LIPASE",  "AMYLASE" in the last 168 hours. No results for input(s): "AMMONIA" in the last 168 hours. Coagulation Profile: Recent Labs  Lab 08/13/23 1644 08/14/23 0505 08/15/23 0303 08/16/23 0303 08/17/23 0240  INR 2.3* 2.5* 3.3* 2.5* 2.0*   Cardiac Enzymes: No results for input(s): "CKTOTAL", "CKMB", "CKMBINDEX", "TROPONINI" in the last 168 hours. BNP (last 3 results) No results for input(s): "PROBNP" in the last 8760 hours. HbA1C: No results for input(s): "HGBA1C" in the last 72 hours. CBG: No results for input(s): "GLUCAP" in the last 168 hours. Lipid Profile: No results for input(s): "CHOL", "HDL", "LDLCALC", "TRIG", "CHOLHDL", "  LDLDIRECT" in the last 72 hours. Thyroid Function Tests: No results for input(s): "TSH", "T4TOTAL", "FREET4", "T3FREE", "THYROIDAB" in the last 72 hours. Anemia Panel: No results for input(s): "VITAMINB12", "FOLATE", "FERRITIN", "TIBC", "IRON", "RETICCTPCT" in the last 72 hours. Sepsis Labs: Recent Labs  Lab 08/13/23 1654  LATICACIDVEN 1.6    No results found for this or any previous visit (from the past 240 hour(s)).       Radiology Studies: DG Swallowing Func-Speech Pathology  Result Date: 08/18/2023 Table formatting from the original result was not included. Modified Barium Swallow Study Patient Details Name: Quintavious Nauss. MRN: [id number] Date of Birth: [birth year] Today's Date: 08/18/2023 HPI/PMH: HPI: Jaquez Segraves is a 89 yo male presenting to ED 12/1 with chronic progressive weakness after a witnessed fall. He sustained no LOC, CTH negative. Admitted with volume overload c/b likely over-diuresis causing AKI. PMH includes CAD, chronic thrombocytopenia, HLD, HTN, permanent A-fib on chronic antiocoagulation, s/p PPM, gout, GERD Clinical Impression: Clinical Impression: Pt with increased lethargy this date and decreased ability to participate compared to previous visit. Pt presents with a significantly impaired oropharyngeal swallow function. He  appears to have deficits related to strength and sensation, which are likely further impacted by cognitive factors. Boluses consistently progress posteriorly prior to tongue propulsion, causing the majority to be retained between the valleculae and the pyriform sinuses at the entrance of the open airway. This occurs simultaenously with pt continuing to piecemeal swallow while also attempting to control boluses orally. As pt initiates a swallow response and achieves adeqaute epiglottic inversion and laryngeal elevation, significant quanitities of specifically thin and nectar thick liquids enter the airway and are silently aspirated (PAS 8). Substantial amounts of aspirates were required to elicit any cough response x1, which is weak and congested. Controlled sips of honey thick liquids resulted in trace, transient penetration, which can be considered a normal age variant. Pureed and regular texture solids did not result in penetration/aspiration and resulted in improved control. He is unable to consistently participate in compensatory strategies at this time. Recommend continuing a regular texture diet but downgrading to honey thick liquids during this time of acute illness. He will require full supervision to monitor for rate control. SLP will continue to follow and he may continue to benefit from intensive f/u upon d/c. Factors that may increase risk of adverse event in presence of aspiration Rubye Oaks & Clearance Coots 2021): Factors that may increase risk of adverse event in presence of aspiration Rubye Oaks & Clearance Coots 2021): Poor general health and/or compromised immunity; Respiratory or GI disease; Reduced cognitive function; Limited mobility; Frail or deconditioned; Weak cough; Frequent aspiration of large volumes Recommendations/Plan: Swallowing Evaluation Recommendations Swallowing Evaluation Recommendations Recommendations: PO diet PO Diet Recommendation: Regular; Moderately thick liquids (Level 3, honey thick) Liquid  Administration via: Cup; Spoon Medication Administration: Crushed with puree Supervision: Full assist for feeding; Full supervision/cueing for swallowing strategies Swallowing strategies  : Minimize environmental distractions; Slow rate; Small bites/sips Postural changes: Position pt fully upright for meals; Stay upright 30-60 min after meals Oral care recommendations: Oral care QID (4x/day); Staff/trained caregiver to provide oral care Treatment Plan Treatment Plan Treatment recommendations: Therapy as outlined in treatment plan below Follow-up recommendations: Acute inpatient rehab (3 hours/day) Functional status assessment: Patient has had a recent decline in their functional status and demonstrates the ability to make significant improvements in function in a reasonable and predictable amount of time. Treatment frequency: Min 2x/week Treatment duration: 2 weeks Interventions: Aspiration precaution training; Oropharyngeal exercises; Compensatory  techniques; Patient/family education; Trials of upgraded texture/liquids; Diet toleration management by SLP; Respiratory muscle strength training Recommendations Recommendations for follow up therapy are one component of a multi-disciplinary discharge planning process, led by the attending physician.  Recommendations may be updated based on patient status, additional functional criteria and insurance authorization. Assessment: Orofacial Exam: Orofacial Exam Oral Cavity: Oral Hygiene: WFL Oral Cavity - Dentition: Adequate natural dentition Orofacial Anatomy: WFL Oral Motor/Sensory Function: WFL Anatomy: Anatomy: Suspected cervical osteophytes Boluses Administered: Boluses Administered Boluses Administered: Thin liquids (Level 0); Mildly thick liquids (Level 2, nectar thick); Moderately thick liquids (Level 3, honey thick); Puree  Oral Impairment Domain: Oral Impairment Domain Lip Closure: Escape from interlabial space or lateral juncture, no extension beyond vermillion  border Tongue control during bolus hold: Cohesive bolus between tongue to palatal seal Bolus preparation/mastication: Timely and efficient chewing and mashing Bolus transport/lingual motion: Slow tongue motion Oral residue: Trace residue lining oral structures Location of oral residue : Tongue; Palate Initiation of pharyngeal swallow : Pyriform sinuses  Pharyngeal Impairment Domain: Pharyngeal Impairment Domain Soft palate elevation: No bolus between soft palate (SP)/pharyngeal wall (PW) Laryngeal elevation: Partial superior movement of thyroid cartilage/partial approximation of arytenoids to epiglottic petiole Anterior hyoid excursion: Complete anterior movement Epiglottic movement: Complete inversion Laryngeal vestibule closure: Incomplete, narrow column air/contrast in laryngeal vestibule Pharyngeal stripping wave : Present - complete Pharyngeal contraction (A/P view only): N/A Pharyngoesophageal segment opening: Complete distension and complete duration, no obstruction of flow Tongue base retraction: Trace column of contrast or air between tongue base and PPW Pharyngeal residue: Trace residue within or on pharyngeal structures Location of pharyngeal residue: Valleculae; Pharyngeal wall  Esophageal Impairment Domain: No data recorded Pill: No data recorded Penetration/Aspiration Scale Score: Penetration/Aspiration Scale Score 1.  Material does not enter airway: Puree; Solid 2.  Material enters airway, remains ABOVE vocal cords then ejected out: Moderately thick liquids (Level 3, honey thick) 8.  Material enters airway, passes BELOW cords without attempt by patient to eject out (silent aspiration) : Thin liquids (Level 0); Mildly thick liquids (Level 2, nectar thick) Compensatory Strategies: Compensatory Strategies Compensatory strategies: Yes Straw: Ineffective Ineffective Straw: Thin liquid (Level 0); Mildly thick liquid (Level 2, nectar thick)   General Information: Caregiver present: No  Diet Prior to this  Study: Regular; Thin liquids (Level 0)   Temperature : Normal   Respiratory Status: Increased WOB   Supplemental O2: Nasal cannula   History of Recent Intubation: No  Behavior/Cognition: Alert; Cooperative; Requires cueing Self-Feeding Abilities: Dependent for feeding Baseline vocal quality/speech: Normal Volitional Cough: Able to elicit Volitional Swallow: Able to elicit Exam Limitations: No limitations Goal Planning: Prognosis for improved oropharyngeal function: Good Barriers to Reach Goals: Cognitive deficits; Time post onset; Severity of deficits No data recorded Patient/Family Stated Goal: none stated Consulted and agree with results and recommendations: Patient Pain: Pain Assessment Pain Assessment: Faces Faces Pain Scale: 4 Pain Location: legs, back and neck with stretching Pain Descriptors / Indicators: Discomfort; Grimacing; Guarding Pain Intervention(s): Monitored during session End of Session: Start Time:SLP Start Time (ACUTE ONLY): 1222 Stop Time: SLP Stop Time (ACUTE ONLY): 1237 Time Calculation:SLP Time Calculation (min) (ACUTE ONLY): 15 min Charges: SLP Evaluations $ SLP Speech Visit: 1 Visit SLP Evaluations $BSS Swallow: 1 Procedure $MBS Swallow: 1 Procedure SLP visit diagnosis: SLP Visit Diagnosis: Dysphagia, oropharyngeal phase (R13.12) Past Medical History: Past Medical History: Diagnosis Date  A-fib (HCC)   Arthritis   CAD (coronary artery disease)   GXT, neg bruce protocol GXT  Chronic  kidney disease   RENAL INSUFFICIENCY  Diverticulosis   ED (erectile dysfunction)   GERD (gastroesophageal reflux disease)   Glaucoma   Gout   no flare in years   Heart disorder   Hypertension   Obesity   Squamous cell carcinoma in situ  Past Surgical History: Past Surgical History: Procedure Laterality Date  CATARACT EXTRACTION, BILATERAL  2018  with lens placement   PACEMAKER INSERTION  01/09/03  TONSILLECTOMY    TOTAL KNEE ARTHROPLASTY Right 02/27/2018  Procedure: RIGHT TOTAL KNEE ARTHROPLASTY;  Surgeon: Durene Romans, MD;  Location: [facility];  Service: Orthopedics;  Laterality: Right;  70 mins  TOTAL KNEE ARTHROPLASTY Left 09/06/2018  Procedure: LEFT TOTAL KNEE ARTHROPLASTY;  Surgeon: Durene Romans, MD;  Location: [facility];  Service: Orthopedics;  Laterality: Left;  70 mins Gwynneth Aliment, M.A., CF-SLP Speech Language Pathology, Acute Rehabilitation Services Secure Chat preferred [phone] 08/18/2023, 2:37 PM          LOS: 7 days   Time spent= 35 mins    Miguel Rota, MD [facility]  If 7PM-7AM, please contact night-coverage  08/20/2023, 11:37 AM

## 2023-08-20 NOTE — TOC Progression Note (Signed)
Transition of Care (TOC) - Progression Note    Patient Details  Name: [name]. MRN: [id number] Date of Birth: 03-23-1931  Transition [facility]) CM/SW Contact  Lawerance Sabal, RN Phone Number: 08/20/2023, 12:27 PM  Clinical Narrative:       Sherron Monday w patient's daughter to set up plan for DC.  Patient will need home hospice services.  We discussed providers, and referral made to [facility], Doreatha Martin RN.  Patient has RW and transport chair at home, will need home oxygen and hospital bed. Daughter wil clear space for hospital bed tonigh/ tomorrow AM in preperation for bed to be delivered tomorrow.  Patient will need PTAR, address on file is correct.  Anticipate update from [facility] once DME has been delivered to the home   [place] (Daughter) [phone]    Expected Discharge Plan: Home w Hospice Care Barriers to Discharge: Continued Medical Work up  Expected Discharge Plan and Services In-house Referral: Clinical Social Work     Living arrangements for the past 2 months: Independent Living Facility                                       Social Determinants of Health (SDOH) Interventions SDOH Screenings   Food Insecurity: No Food Insecurity (08/14/2023)  Housing: Low Risk  (08/14/2023)  Transportation Needs: No Transportation Needs (08/14/2023)  Utilities: Not At Risk (08/14/2023)  Depression (PHQ2-9): Low Risk  (01/23/2023)  Financial Resource Strain: Low Risk  (06/16/2022)  Tobacco Use: Medium Risk (08/14/2023)    Readmission Risk Interventions     No data to display

## 2023-08-20 NOTE — Progress Notes (Signed)
[facility] [phone] [facility] Liaison Note  Referral received from [facility], Lawerance Sabal, regarding family interest in hospice services at home at discharge.  Spoke with patient's daughter, Merrilee Jansky, to discuss hospice philosophy, services and team approach to care.  Mardene Celeste confirmed desire to move forward with hospice services at discharge.    Address confirmed as correct in chart.  Patient has rolling walker and transport chair in home currently.  Request for oxygen, hospital bed and overbed table to be delivered prior to patient discharge.  Additionally, family requested foley catheter for ease of patient care--request relayed to MD and plans for foley insertion prior to discharge.  Family has liaison number in case of questions prior to discharge.  Thank you for the opportunity to participate in this patient's care.  Doreatha Martin, RN, [facility] [phone]

## 2023-08-20 NOTE — Progress Notes (Signed)
Initial Nutrition Assessment  DOCUMENTATION CODES:   Obesity unspecified  INTERVENTION:   Ensure Plus High Protein po BID, each supplement provides 350 kcal and 20 grams of protein.   NUTRITION DIAGNOSIS:   Increased nutrient needs related to chronic illness as evidenced by estimated needs.    GOAL:   Patient will meet greater than or equal to 90% of their needs    MONITOR:   PO intake, Supplement acceptance  REASON FOR ASSESSMENT:   Consult Assessment of nutrition requirement/status, Diet education  ASSESSMENT: 89 y.o. M, Presented after falling while getting out of car. Witnessed fall. PMH;  CAD, chronic thrombocytopenia, HLD, HTN, permanent A-fib, chronic anticoagulation, sp PPM, history of gout. With reported decline oral intake. Review of EMR revealed ; That family reports a decline in appetite losing interest in his usual activities. Chronic diarrhea ~ 5 x daily ~ 1 year. Patient will be discharging home with hospice care.  Admit weight: 83.7 kg Current weight: 83. 7 kg  Weight history;  08/19/23 83.7 kg  08/01/23 85.3 kg  07/11/23 85.5 kg  01/23/23 80.7 kg  01/19/23 79.8 kg    Average Meal Intake: 10-100: 62% intake x 5 recorded meals  Reviewed Nutritionally Relevant Medications:   Labs Reviewed  NUTRITION - FOCUSED PHYSICAL EXAM:  Deferred   Diet Order:   Diet Order             Diet regular Room service appropriate? Yes; Fluid consistency: Thin  Diet effective now                   EDUCATION NEEDS:   Not appropriate for education at this time  Skin:  Skin Assessment: Reviewed RN Assessment  Last BM:  12/8  Height:   Ht Readings from Last 1 Encounters:  08/13/23 5\' 4"  (1.626 m)    Weight:   Wt Readings from Last 1 Encounters:  08/19/23 83.7 kg    Ideal Body Weight:     BMI:  Body mass index is 31.67 kg/m.  Estimated Nutritional Needs:   Kcal:  1800-2100 kcal/d  Protein:  80-90 g/d  Fluid:   37ml/kcal    Jamelle Haring RDN, LDN Clinical Dietitian  Pleas see Amion for contact information

## 2023-08-21 DIAGNOSIS — E8770 Fluid overload, unspecified: Secondary | ICD-10-CM | POA: Diagnosis not present

## 2023-08-21 MED ORDER — SENNOSIDES-DOCUSATE SODIUM 8.6-50 MG PO TABS: 1.0000 | ORAL_TABLET | Freq: Every evening | ORAL | 0 refills | Status: DC | PRN

## 2023-08-21 MED ORDER — OXYBUTYNIN CHLORIDE 2.5 MG PO TABS: 2.5000 mg | ORAL_TABLET | Freq: Four times a day (QID) | ORAL | 0 refills | Status: DC | PRN

## 2023-08-21 MED ORDER — LORAZEPAM 2 MG/ML PO CONC: 1.0000 mg | ORAL | 0 refills | Status: DC | PRN

## 2023-08-21 MED ORDER — BACLOFEN 5 MG PO TABS: 5.0000 mg | ORAL_TABLET | Freq: Two times a day (BID) | ORAL | 0 refills | Status: DC | PRN

## 2023-08-21 MED ORDER — OXYCODONE HCL 5 MG/5ML PO SOLN
5.0000 mg | Freq: Four times a day (QID) | ORAL | 0 refills | Status: DC | PRN
Start: 2023-08-21 — End: 2023-08-24

## 2023-08-21 NOTE — TOC Progression Note (Addendum)
Transition of Care (TOC) - Progression Note    Patient Details  Name: [name]. MRN: [id number] Date of Birth: Jan 10, 1931  Transition of Care [facility]) CM/SW Contact  Leone Haven, RN Phone Number: 08/21/2023, 11:46 AM  Clinical Narrative:    NCM spoke with Ines Bloomer with [facility], he states Melissa will let this NCM know once DME has been delivered to the patient 's home.  He will need a hospital bed and home oxygen.  He will also need ambulance transport home.  1228- NCM checked with daughter, she states the DME has been delivered.  NCM scheduled ptar.   Expected Discharge Plan: Home w Hospice Care Barriers to Discharge: Continued Medical Work up  Expected Discharge Plan and Services In-house Referral: Clinical Social Work     Living arrangements for the past 2 months: Independent Living Facility                                       Social Determinants of Health (SDOH) Interventions SDOH Screenings   Food Insecurity: No Food Insecurity (08/14/2023)  Housing: Low Risk  (08/14/2023)  Transportation Needs: No Transportation Needs (08/14/2023)  Utilities: Not At Risk (08/14/2023)  Depression (PHQ2-9): Low Risk  (01/23/2023)  Financial Resource Strain: Low Risk  (06/16/2022)  Tobacco Use: Medium Risk (08/14/2023)    Readmission Risk Interventions     No data to display

## 2023-08-21 NOTE — Plan of Care (Signed)
  Problem: Education: Goal: Knowledge of General Education information will improve Description: Including pain rating scale, medication(s)/side effects and non-pharmacologic comfort measures Outcome: Not Progressing   Problem: Health Behavior/Discharge Planning: Goal: Ability to manage health-related needs will improve Outcome: Not Progressing   Problem: Clinical Measurements: Goal: Ability to maintain clinical measurements within normal limits will improve Outcome: Not Progressing Goal: Will remain free from infection Outcome: Not Progressing Goal: Diagnostic test results will improve Outcome: Not Progressing Goal: Respiratory complications will improve Outcome: Not Progressing Goal: Cardiovascular complication will be avoided Outcome: Not Progressing   Problem: Activity: Goal: Risk for activity intolerance will decrease Outcome: Not Progressing   Problem: Nutrition: Goal: Adequate nutrition will be maintained Outcome: Not Progressing   Problem: Coping: Goal: Level of anxiety will decrease Outcome: Not Progressing   Problem: Elimination: Goal: Will not experience complications related to bowel motility Outcome: Not Progressing Goal: Will not experience complications related to urinary retention Outcome: Not Progressing   Problem: Pain Management: Goal: General experience of comfort will improve Outcome: Not Progressing   Problem: Safety: Goal: Ability to remain free from injury will improve Outcome: Not Progressing   Problem: Skin Integrity: Goal: Risk for impaired skin integrity will decrease Outcome: Not Progressing   Problem: Education: Goal: Knowledge of the prescribed therapeutic regimen will improve Outcome: Not Progressing   Problem: Coping: Goal: Ability to identify and develop effective coping behavior will improve Outcome: Not Progressing   Problem: Clinical Measurements: Goal: Quality of life will improve Outcome: Not Progressing   Problem:  Respiratory: Goal: Verbalizations of increased ease of respirations will increase Outcome: Not Progressing   Problem: Role Relationship: Goal: Family's ability to cope with current situation will improve Outcome: Not Progressing Goal: Ability to verbalize concerns, feelings, and thoughts to partner or family member will improve Outcome: Not Progressing   Problem: Pain Management: Goal: Satisfaction with pain management regimen will improve Outcome: Not Progressing

## 2023-08-21 NOTE — Progress Notes (Signed)
Mobility Specialist Progress Note:    08/21/23 1224  Mobility  Activity Moved into chair position in bed;Turned to back - supine;Turned to left side;Turned to right side  Level of Assistance +2 (takes two people) (MaxA)  Assistive Device Other (Comment) (HHA, bed pads)  Activity Response Tolerated well  Mobility Referral Yes  Mobility visit 1 Mobility  Mobility Specialist Start Time (ACUTE ONLY) 1045  Mobility Specialist Stop Time (ACUTE ONLY) 1100  Mobility Specialist Time Calculation (min) (ACUTE ONLY) 15 min   RN requested assistance reposition patient in bed. Upon moving him, noticed pt had a BM. Assisted RN in pericare and linen change. Pt was able to follow verbal cues from moving left to right and was able to pull themselves a little bit during rolling. Repositioned in bed w/ call bell and personal belongings in reach. Bed alarm on.    Thompson Grayer Mobility Specialist  Please contact vis Secure Chat or  Rehab Office [phone] '

## 2023-08-21 NOTE — TOC Transition Note (Signed)
Transition of Care [facility]) - CM/SW Discharge Note   Patient Details  Name: [name]. MRN: [id number] Date of Birth: 13-Aug-1931  [facility]) CM/SW Contact:  Leone Haven, RN Phone Number: 08/21/2023, 12:29 PM   Clinical Narrative:    For dc today home with hospice.  DME has been delivered.  NCM scheduled ptar.  Staff RN notified, DNR on chart.       Barriers to Discharge: Continued Medical Work up   Patient Goals and CMS Choice      Discharge Placement                         Discharge Plan and Services Additional resources added to the After Visit Summary for   In-house Referral: Clinical Social Work                                   Social Determinants of Health (SDOH) Interventions SDOH Screenings   Food Insecurity: No Food Insecurity (08/14/2023)  Housing: Low Risk  (08/14/2023)  Transportation Needs: No Transportation Needs (08/14/2023)  Utilities: Not At Risk (08/14/2023)  Depression (PHQ2-9): Low Risk  (01/23/2023)  Financial Resource Strain: Low Risk  (06/16/2022)  Tobacco Use: Medium Risk (08/14/2023)     Readmission Risk Interventions     No data to display

## 2023-08-21 NOTE — Discharge Summary (Signed)
Physician Discharge Summary  [place]. ZHY:[id number] DOB: 1930-10-18 DOA: 08/13/2023  PCP: Ronnald Nian, MD  Admit date: 08/13/2023 Discharge date: 08/21/2023  Admitted From: Home Disposition: Home with hospice  Recommendations for Outpatient Follow-up:  Follow up with PCP in 1-2 weeks Please obtain BMP/CBC in one week your next doctors visit.  Comfort medications prescribed  Home Health: None Equipment/Devices: Foley cath Disch DNR arge Condition: Stable CODE STATUS: DNR Diet recommendation: Comfort feeding  Brief/Interim Summary: Brief Narrative:  89 y/o male w/ PMHx of CAD, chronic thrombocytopenia, HLD, HTN, permanent A-fib, chronic anticoagulation, sp PPM, history of gout. She follows with [facility] cardiology. At baseline patient weak, uses a walker for ambulation.  On the day of admission had a fall which was witnessed without loss of consciousness or evidence of seizure.  Upon admission noted to have some evidence of volume overload.  Hospital course course complicated by likely overdiuresis causing AKI requiring brief course of IV fluid.  In the meantime encourage oral hydration.  Initially PT recommended CIR but patient continued to decline with lack of desire to eat drink and participate in therapy.  Now transition to comfort care.  TOC consulted to contact hospice liaison. Hospice team making home arrangements for hospital bed, oxygen.  Patient will be going home with hospice with Foley catheter for comfort.  Assessment & Plan:  Principal Problem:   Fluid overload Active Problems:   Hyperlipidemia LDL goal <70   Primary hypertension   Long term (current) use of anticoagulants   CAD (coronary artery disease)   Permanent atrial fibrillation (HCC)   Fall at home, initial encounter     Acute congestive heart failure with reduced EF, 40% --Patient was initially diuresed but thereafter went into mild AKI.  Getting gentle hydration as necessary but for now we will  stop all the further intervention as family is interested in hospice.  TOC consulted to contact hospice liaison  AKI; Baseline 1.3 -Despite of IV fluids, his p.o. intake remains very poor.  Creatinine remains around 1.8.  Family no longer wishing to pursue further IV fluids and further lab work which I agree with.  Dysphagia - MBS performed, shows chronic dysphagia.  Will place him on regular diet without any restrictions   Chronic lower extremity skin changes secondary to venous stasis. Supportive care   Intertrigo in moist folds/groin -Nystatin cream   Essential hypertension Discontinue Coreg   Physical deconditioning with fall Right knee pain -Trauma workup is negative.  PT/OT-home health -Right knee x-ray negative for any acute pathology.  Intact arthroplasty     CAD (coronary artery disease), no chest pain Discontinue Coreg and statin    Thrombocytopenia - chronic -Baseline stable   Hyperlipidemia LDL goal <70 -Simvastatin discontinued   Permanent atrial fibrillation (HCC) History of symptomatic bradycardia status post pacemaker Chronic anticoagulation Discontinue Coumadin and Coreg   Gout -Resume allopurinol   BPH Discontinue meds  Hospice arrangements being made   DVT prophylaxis: None Code Status: dnr Family Communication: Family at bedside  Hospice arrangements being made today, should be able to go home later today ation   Subjective: Slightly more awake but confused.  Poor oral intake. Examination:  General exam: Lethargic, elderly, frail on 4 L nasal cannula  Respiratory system: Mild bibasilar crackles Cardiovascular system: S1 & S2 heard, RRR. No JVD, murmurs, rubs, gallops or clicks. No pedal edema. Gastrointestinal system: Abdomen is nondistended, soft and nontender. No organomegaly or masses felt. Normal bowel sounds heard. Central nervous  system: Alert and oriented. No focal neurological deficits. Extremities: Symmetric 5 x 5 power. Skin:  chronic b/l le skin changes.  Psychiatry: Judgement and insight appear poor    Discharge Diagnoses:  Principal Problem:   Fluid overload Active Problems:   Hyperlipidemia LDL goal <70   Primary hypertension   Long term (current) use of anticoagulants   CAD (coronary artery disease)   Permanent atrial fibrillation (HCC)   Fall at home, initial encounter     Discharge Exam: Vitals:   08/20/23 2051 08/21/23 0803  BP: 135/68 (!) 151/90  Pulse: (!) 127 91  Resp: 17 20  Temp: 98 F (36.7 C) 98.1 F (36.7 C)  SpO2: 95% 92%   Vitals:   08/20/23 1607 08/20/23 2051 08/21/23 0500 08/21/23 0803  BP: 118/65 135/68  (!) 151/90  Pulse: 88 (!) 127  91  Resp: 16 17  20   Temp:  98 F (36.7 C)  98.1 F (36.7 C)  TempSrc:  Axillary  Oral  SpO2: 91% 95%  92%  Weight:   90 kg   Height:        Discharge Instructions   Allergies as of 08/21/2023       Reactions   Tramadol Itching        Medication List     STOP taking these medications    acetaminophen 500 MG tablet Commonly known as: TYLENOL   allopurinol 100 MG tablet Commonly known as: ZYLOPRIM   carvedilol 3.125 MG tablet Commonly known as: COREG   finasteride 5 MG tablet Commonly known as: PROSCAR   furosemide 20 MG tablet Commonly known as: LASIX   latanoprost 0.005 % ophthalmic solution Commonly known as: XALATAN   PRESERVISION AREDS 2 PO   simvastatin 20 MG tablet Commonly known as: ZOCOR   tamsulosin 0.4 MG Caps capsule Commonly known as: FLOMAX   Vitamin D3 25 MCG (1000 UT) Caps   warfarin 2.5 MG tablet Commonly known as: COUMADIN       TAKE these medications    Baclofen 5 MG Tabs Take 1 tablet (5 mg total) by mouth every 12 (twelve) hours as needed for muscle spasms (hiccoughs).   LORazepam 2 MG/ML concentrated solution Commonly known as: ATIVAN Place 0.5 mLs (1 mg total) under the tongue every 4 (four) hours as needed for anxiety.   oxyBUTYnin Chloride 2.5 MG Tabs Take 2.5 mg  by mouth 4 (four) times daily as needed for bladder spasms.   oxyCODONE 5 MG/5ML solution Commonly known as: ROXICODONE Take 5 mLs (5 mg total) by mouth every 6 (six) hours as needed for severe pain (pain score 7-10).   senna-docusate 8.6-50 MG tablet Commonly known as: Senokot-S Take 1 tablet by mouth at bedtime as needed for moderate constipation.               Durable Medical Equipment  (From admission, onward)           Start     Ordered   08/20/23 1309  For home use only DME Hospital bed  Once       Question Answer Comment  Length of Need Lifetime   Bed type Semi-electric      08/20/23 1308   08/20/23 1308  For home use only DME oxygen  Once       Question Answer Comment  Length of Need Lifetime   Oxygen delivery system Gas      08/20/23 1308  Follow-up Information     Ronnald Nian, MD Follow up in 1 week(s).   Specialty: Family Medicine Contact information: [address] [place] [address] [phone]                Allergies  Allergen Reactions   Tramadol Itching    You were cared for by a hospitalist during your hospital stay. If you have any questions about your discharge medications or the care you received while you were in the hospital after you are discharged, you can call the unit and asked to speak with the hospitalist on call if the hospitalist that took care of you is not available. Once you are discharged, your primary care physician will handle any further medical issues. Please note that no refills for any discharge medications will be authorized once you are discharged, as it is imperative that you return to your primary care physician (or establish a relationship with a primary care physician if you do not have one) for your aftercare needs so that they can reassess your need for medications and monitor your lab values.  You were cared for by a hospitalist during your hospital stay. If you have any  questions about your discharge medications or the care you received while you were in the hospital after you are discharged, you can call the unit and asked to speak with the hospitalist on call if the hospitalist that took care of you is not available. Once you are discharged, your primary care physician will handle any further medical issues. Please note that NO REFILLS for any discharge medications will be authorized once you are discharged, as it is imperative that you return to your primary care physician (or establish a relationship with a primary care physician if you do not have one) for your aftercare needs so that they can reassess your need for medications and monitor your lab values.  Please request your Prim.MD to go over all Hospital Tests and Procedure/Radiological results at the follow up, please get all Hospital records sent to your Prim MD by signing hospital release before you go home.  Get CBC, CMP, 2 view Chest X ray checked  by Primary MD during your next visit or SNF MD in 5-7 days ( we routinely change or add medications that can affect your baseline labs and fluid status, therefore we recommend that you get the mentioned basic workup next visit with your PCP, your PCP may decide not to get them or add new tests based on their clinical decision)  On your next visit with your primary care physician please Get Medicines reviewed and adjusted.  If you experience worsening of your admission symptoms, develop shortness of breath, life threatening emergency, suicidal or homicidal thoughts you must seek medical attention immediately by calling 911 or calling your MD immediately  if symptoms less severe.  You Must read complete instructions/literature along with all the possible adverse reactions/side effects for all the Medicines you take and that have been prescribed to you. Take any new Medicines after you have completely understood and accpet all the possible adverse reactions/side  effects.   Do not drive, operate heavy machinery, perform activities at heights, swimming or participation in water activities or provide baby sitting services if your were admitted for syncope or siezures until you have seen by Primary MD or a Neurologist and advised to do so again.  Do not drive when taking Pain medications.   Procedures/Studies: DG Swallowing Func-Speech Pathology  Result Date:  08/18/2023 Table formatting from the original result was not included. Modified Barium Swallow Study Patient Details Name: Almin Manwarren. MRN: [id number] Date of Birth: October 23, 1930 Today's Date: 08/18/2023 HPI/PMH: HPI: Oatis Kinkade is a 89 yo male presenting to ED 12/1 with chronic progressive weakness after a witnessed fall. He sustained no LOC, CTH negative. Admitted with volume overload c/b likely over-diuresis causing AKI. PMH includes CAD, chronic thrombocytopenia, HLD, HTN, permanent A-fib on chronic antiocoagulation, s/p PPM, gout, GERD Clinical Impression: Clinical Impression: Pt with increased lethargy this date and decreased ability to participate compared to previous visit. Pt presents with a significantly impaired oropharyngeal swallow function. He appears to have deficits related to strength and sensation, which are likely further impacted by cognitive factors. Boluses consistently progress posteriorly prior to tongue propulsion, causing the majority to be retained between the valleculae and the pyriform sinuses at the entrance of the open airway. This occurs simultaenously with pt continuing to piecemeal swallow while also attempting to control boluses orally. As pt initiates a swallow response and achieves adeqaute epiglottic inversion and laryngeal elevation, significant quanitities of specifically thin and nectar thick liquids enter the airway and are silently aspirated (PAS 8). Substantial amounts of aspirates were required to elicit any cough response x1, which is weak and congested.  Controlled sips of honey thick liquids resulted in trace, transient penetration, which can be considered a normal age variant. Pureed and regular texture solids did not result in penetration/aspiration and resulted in improved control. He is unable to consistently participate in compensatory strategies at this time. Recommend continuing a regular texture diet but downgrading to honey thick liquids during this time of acute illness. He will require full supervision to monitor for rate control. SLP will continue to follow and he may continue to benefit from intensive f/u upon d/c. Factors that may increase risk of adverse event in presence of aspiration Rubye Oaks [employer] Clearance Coots 2021): Factors that may increase risk of adverse event in presence of aspiration Rubye Oaks & Clearance Coots 2021): Poor general health and/or compromised immunity; Respiratory or GI disease; Reduced cognitive function; Limited mobility; Frail or deconditioned; Weak cough; Frequent aspiration of large volumes Recommendations/Plan: Swallowing Evaluation Recommendations Swallowing Evaluation Recommendations Recommendations: PO diet PO Diet Recommendation: Regular; Moderately thick liquids (Level 3, honey thick) Liquid Administration via: Cup; Spoon Medication Administration: Crushed with puree Supervision: Full assist for feeding; Full supervision/cueing for swallowing strategies Swallowing strategies  : Minimize environmental distractions; Slow rate; Small bites/sips Postural changes: Position pt fully upright for meals; Stay upright 30-60 min after meals Oral care recommendations: Oral care QID (4x/day); Staff/trained caregiver to provide oral care Treatment Plan Treatment Plan Treatment recommendations: Therapy as outlined in treatment plan below Follow-up recommendations: Acute inpatient rehab (3 hours/day) Functional status assessment: Patient has had a recent decline in their functional status and demonstrates the ability to make significant improvements in  function in a reasonable and predictable amount of time. Treatment frequency: Min 2x/week Treatment duration: 2 weeks Interventions: Aspiration precaution training; Oropharyngeal exercises; Compensatory techniques; Patient/family education; Trials of upgraded texture/liquids; Diet toleration management by SLP; Respiratory muscle strength training Recommendations Recommendations for follow up therapy are one component of a multi-disciplinary discharge planning process, led by the attending physician.  Recommendations may be updated based on patient status, additional functional criteria and insurance authorization. Assessment: Orofacial Exam: Orofacial Exam Oral Cavity: Oral Hygiene: WFL Oral Cavity - Dentition: Adequate natural dentition Orofacial Anatomy: WFL Oral Motor/Sensory Function: WFL Anatomy: Anatomy: Suspected cervical osteophytes Boluses Administered: Boluses  Administered Boluses Administered: Thin liquids (Level 0); Mildly thick liquids (Level 2, nectar thick); Moderately thick liquids (Level 3, honey thick); Puree  Oral Impairment Domain: Oral Impairment Domain Lip Closure: Escape from interlabial space or lateral juncture, no extension beyond vermillion border Tongue control during bolus hold: Cohesive bolus between tongue to palatal seal Bolus preparation/mastication: Timely and efficient chewing and mashing Bolus transport/lingual motion: Slow tongue motion Oral residue: Trace residue lining oral structures Location of oral residue : Tongue; Palate Initiation of pharyngeal swallow : Pyriform sinuses  Pharyngeal Impairment Domain: Pharyngeal Impairment Domain Soft palate elevation: No bolus between soft palate (SP)/pharyngeal wall (PW) Laryngeal elevation: Partial superior movement of thyroid cartilage/partial approximation of arytenoids to epiglottic petiole Anterior hyoid excursion: Complete anterior movement Epiglottic movement: Complete inversion Laryngeal vestibule closure: Incomplete, narrow  column air/contrast in laryngeal vestibule Pharyngeal stripping wave : Present - complete Pharyngeal contraction (A/P view only): N/A Pharyngoesophageal segment opening: Complete distension and complete duration, no obstruction of flow Tongue base retraction: Trace column of contrast or air between tongue base and PPW Pharyngeal residue: Trace residue within or on pharyngeal structures Location of pharyngeal residue: Valleculae; Pharyngeal wall  Esophageal Impairment Domain: No data recorded Pill: No data recorded Penetration/Aspiration Scale Score: Penetration/Aspiration Scale Score 1.  Material does not enter airway: Puree; Solid 2.  Material enters airway, remains ABOVE vocal cords then ejected out: Moderately thick liquids (Level 3, honey thick) 8.  Material enters airway, passes BELOW cords without attempt by patient to eject out (silent aspiration) : Thin liquids (Level 0); Mildly thick liquids (Level 2, nectar thick) Compensatory Strategies: Compensatory Strategies Compensatory strategies: Yes Straw: Ineffective Ineffective Straw: Thin liquid (Level 0); Mildly thick liquid (Level 2, nectar thick)   General Information: Caregiver present: No  Diet Prior to this Study: Regular; Thin liquids (Level 0)   Temperature : Normal   Respiratory Status: Increased WOB   Supplemental O2: Nasal cannula   History of Recent Intubation: No  Behavior/Cognition: Alert; Cooperative; Requires cueing Self-Feeding Abilities: Dependent for feeding Baseline vocal quality/speech: Normal Volitional Cough: Able to elicit Volitional Swallow: Able to elicit Exam Limitations: No limitations Goal Planning: Prognosis for improved oropharyngeal function: Good Barriers to Reach Goals: Cognitive deficits; Time post onset; Severity of deficits No data recorded Patient/Family Stated Goal: none stated Consulted and agree with results and recommendations: Patient Pain: Pain Assessment Pain Assessment: Faces Faces Pain Scale: 4 Pain Location: legs,  back and neck with stretching Pain Descriptors / Indicators: Discomfort; Grimacing; Guarding Pain Intervention(s): Monitored during session End of Session: Start Time:SLP Start Time (ACUTE ONLY): 1222 Stop Time: SLP Stop Time (ACUTE ONLY): 1237 Time Calculation:SLP Time Calculation (min) (ACUTE ONLY): 15 min Charges: SLP Evaluations $ SLP Speech Visit: 1 Visit SLP Evaluations $BSS Swallow: 1 Procedure $MBS Swallow: 1 Procedure SLP visit diagnosis: SLP Visit Diagnosis: Dysphagia, oropharyngeal phase (R13.12) Past Medical History: Past Medical History: Diagnosis Date  A-fib (HCC)   Arthritis   CAD (coronary artery disease)   GXT, neg bruce protocol GXT  Chronic kidney disease   RENAL INSUFFICIENCY  Diverticulosis   ED (erectile dysfunction)   GERD (gastroesophageal reflux disease)   Glaucoma   Gout   no flare in years   Heart disorder   Hypertension   Obesity   Squamous cell carcinoma in situ  Past Surgical History: Past Surgical History: Procedure Laterality Date  CATARACT EXTRACTION, BILATERAL  2018  with lens placement   PACEMAKER INSERTION  01/09/03  TONSILLECTOMY    TOTAL KNEE ARTHROPLASTY Right  02/27/2018  Procedure: RIGHT TOTAL KNEE ARTHROPLASTY;  Surgeon: Durene Romans, MD;  Location: [facility];  Service: Orthopedics;  Laterality: Right;  70 mins  TOTAL KNEE ARTHROPLASTY Left 09/06/2018  Procedure: LEFT TOTAL KNEE ARTHROPLASTY;  Surgeon: Durene Romans, MD;  Location: [facility];  Service: Orthopedics;  Laterality: Left;  70 mins Gwynneth Aliment, M.A., CF-SLP Speech Language Pathology, Acute Rehabilitation Services Secure Chat preferred [phone] 08/18/2023, 2:37 PM  ECHOCARDIOGRAM COMPLETE  Result Date: 08/14/2023    ECHOCARDIOGRAM REPORT   Patient Name:   Asir Lanoue. Date of Exam: 08/14/2023 Medical Rec #:  [id number]          Height:       64.0 in Accession #:    [id number]         Weight:       188.1 lb Date of Birth:  August 02, 1931          BSA:          1.906 m Patient Age:    89 years           BP:            112/69 mmHg Patient Gender: M                  HR:           80 bpm. Exam Location:  Inpatient Procedure: 2D Echo, Color Doppler and Cardiac Doppler Indications:    CHF-Acute Diastolic I50.31  History:        Patient has prior history of Echocardiogram examinations, most                 recent 10/10/2019. CAD, Aortic Valve Disease; Arrythmias:Atrial                 Fibrillation.  Sonographer:    Darlys Gales Referring Phys: [phone] DEBBY CROSLEY IMPRESSIONS  1. Left ventricular ejection fraction, by estimation, is 40 to 45%. Left ventricular ejection fraction by PLAX is 40 %. The left ventricle has mildly decreased function. The left ventricle demonstrates global hypokinesis. There is mild left ventricular hypertrophy. Left ventricular diastolic function could not be evaluated.  2. Right ventricular systolic function is low normal. The right ventricular size is normal.  3. Left atrial size was severely dilated.  4. Right atrial size was massively dilated.  5. The mitral valve is abnormal. Mild to moderate mitral valve regurgitation.  6. The tricuspid valve is abnormal. Tricuspid valve regurgitation is moderate to severe.  7. The aortic valve is tricuspid. There is moderate calcification of the aortic valve. Aortic valve regurgitation is trivial. Suspect low flow, low gradient moderate to severe aortic valve stenosis. Aortic valve area, by VTI measures 0.61 cm, based on an LVOT diameter of 1.9 cm. Aortic valve mean gradient measures 18.5 mmHg. Aortic valve Vmax measures 2.72 m/s. Peak gradient 29.5 mmHg, DI is 0.22. Comparison(s): Changes from prior study are noted. 10/10/2019: LVEF 50-55%. mild AS - mean gradient 14.8 mmHg. Conclusion(s)/Recommendation(s): Findings concerning for low flow, low gradient severe aortic stenosis. FINDINGS  Left Ventricle: Left ventricular ejection fraction, by estimation, is 40 to 45%. Left ventricular ejection fraction by PLAX is 40 %. The left ventricle has mildly decreased function. The  left ventricle demonstrates global hypokinesis. The left ventricular internal cavity size was normal in size. There is mild left ventricular hypertrophy. Left ventricular diastolic function could not be evaluated due to atrial fibrillation. Left ventricular diastolic function could not be evaluated. Right Ventricle: The  right ventricular size is normal. No increase in right ventricular wall thickness. Right ventricular systolic function is low normal. Left Atrium: Left atrial size was severely dilated. Right Atrium: Right atrial size was massively dilated. Pericardium: There is no evidence of pericardial effusion. Mitral Valve: The mitral valve is abnormal. Mild to moderate mitral annular calcification. Mild to moderate mitral valve regurgitation. Tricuspid Valve: The tricuspid valve is abnormal. Tricuspid valve regurgitation is moderate to severe. Aortic Valve: The aortic valve is tricuspid. There is moderate calcification of the aortic valve. Aortic valve regurgitation is trivial. Moderate to severe aortic stenosis is present. Aortic valve mean gradient measures 18.5 mmHg. Aortic valve peak gradient measures 29.5 mmHg. Aortic valve area, by VTI measures 0.61 cm. Pulmonic Valve: The pulmonic valve was grossly normal. Pulmonic valve regurgitation is trivial. Aorta: The aortic root and ascending aorta are structurally normal, with no evidence of dilitation. Venous: The inferior vena cava was not well visualized. IAS/Shunts: No atrial level shunt detected by color flow Doppler. Additional Comments: A device lead is visualized.  LEFT VENTRICLE PLAX 2D LV EF:         Left            Diastology                ventricular     LV e' medial:    7.62 cm/s                ejection        LV E/e' medial:  12.7                fraction by     LV e' lateral:   10.80 cm/s                PLAX is 40      LV E/e' lateral: 9.0                %. LVIDd:         3.70 cm LVIDs:         3.00 cm LV PW:         1.30 cm LV IVS:        1.20 cm  LVOT diam:     1.90 cm LV SV:         35 LV SV Index:   19 LVOT Area:     2.84 cm  RIGHT VENTRICLE RV S prime:     11.10 cm/s TAPSE (M-mode): 1.9 cm LEFT ATRIUM              Index         RIGHT ATRIUM           Index LA Vol (A2C):   199.0 ml 104.42 ml/m  RA Area:     50.70 cm LA Vol (A4C):   126.0 ml 66.11 ml/m   RA Volume:   249.00 ml 130.65 ml/m LA Biplane Vol: 167.0 ml 87.63 ml/m  AORTIC VALVE AV Area (Vmax):    0.68 cm AV Area (Vmean):   0.70 cm AV Area (VTI):     0.61 cm AV Vmax:           271.50 cm/s AV Vmean:          200.500 cm/s AV VTI:            0.578 m AV Peak Grad:      29.5 mmHg AV Mean Grad:      18.5 mmHg LVOT  Vmax:         64.70 cm/s LVOT Vmean:        49.750 cm/s LVOT VTI:          0.125 m LVOT/AV VTI ratio: 0.22  AORTA Ao Root diam: 3.20 cm MITRAL VALVE               TRICUSPID VALVE MV Area (PHT): 2.90 cm    TR Peak grad:   40.2 mmHg MV Decel Time: 262 msec    TR Vmax:        317.00 cm/s MV E velocity: 96.90 cm/s                            SHUNTS                            Systemic VTI:  0.12 m                            Systemic Diam: 1.90 cm Zoila Shutter MD Electronically signed by Zoila Shutter MD Signature Date/Time: 08/14/2023/4:02:23 PM    Final    DG Tibia/Fibula Right  Result Date: 08/14/2023 CLINICAL DATA:  Fall. EXAM: RIGHT TIBIA AND FIBULA - 2 VIEW COMPARISON:  Right ankle radiographs dated December 12, 2016. CT of the right knee dated March 13, 2013. FINDINGS: Status post right total knee arthroplasty in appropriate alignment. The femoral and tibial components appear well seated. No periprosthetic lucency. No acute fracture or dislocation. Vascular calcifications are noted. No significant focal soft tissue swelling. IMPRESSION: No acute osseous abnormality.  Intact right total knee arthroplasty. Electronically Signed   By: Hart Robinsons M.D.   On: 08/14/2023 10:27   CT HEAD WO CONTRAST  Result Date: 08/13/2023 CLINICAL DATA:  Head trauma, moderate-severe; Polytrauma,  blunt EXAM: CT HEAD WITHOUT CONTRAST CT CERVICAL SPINE WITHOUT CONTRAST TECHNIQUE: Multidetector CT imaging of the head and cervical spine was performed following the standard protocol without intravenous contrast. Multiplanar CT image reconstructions of the cervical spine were also generated. RADIATION DOSE REDUCTION: This exam was performed according to the departmental dose-optimization program which includes automated exposure control, adjustment of the mA and/or kV according to patient size and/or use of iterative reconstruction technique. COMPARISON:  Chest x-ray 08/13/2023 FINDINGS: CT HEAD FINDINGS Brain: Cerebral ventricle sizes are concordant with the degree of cerebral volume loss. Patchy and confluent areas of decreased attenuation are noted throughout the deep and periventricular white matter of the cerebral hemispheres bilaterally, compatible with chronic microvascular ischemic disease. No evidence of large-territorial acute infarction. No parenchymal hemorrhage. No mass lesion. No extra-axial collection. No mass effect or midline shift. No hydrocephalus. Basilar cisterns are patent. Vascular: No hyperdense vessel. Atherosclerotic calcifications are present within the cavernous internal carotid and vertebral arteries. Skull: No acute fracture or focal lesion. Sinuses/Orbits: Paranasal sinuses and mastoid air cells are clear. Bilateral lens replacement. Otherwise the orbits are unremarkable. Other: 7 mm right scalp hematoma. CT CERVICAL SPINE FINDINGS Alignment: Normal. Skull base and vertebrae: Multilevel moderate degenerative change of the spine. Associated moderate to severe bilateral C3-C4 C5-C6 osseous neural foraminal stenosis. No severe osseous central canal stenosis. No acute fracture. No aggressive appearing focal osseous lesion or focal pathologic process. Soft tissues and spinal canal: No prevertebral fluid or swelling. No visible canal hematoma. Upper chest: Small volume right pleural  effusion. Other: Atherosclerotic plaque of  the carotid arteries within the neck. IMPRESSION: 1. No acute intracranial abnormality. 2. No acute displaced fracture or traumatic listhesis of the cervical spine. 3. Small volume right pleural effusion. Electronically Signed   By: Tish Frederickson M.D.   On: 08/13/2023 17:28   CT CERVICAL SPINE WO CONTRAST  Result Date: 08/13/2023 CLINICAL DATA:  Head trauma, moderate-severe; Polytrauma, blunt EXAM: CT HEAD WITHOUT CONTRAST CT CERVICAL SPINE WITHOUT CONTRAST TECHNIQUE: Multidetector CT imaging of the head and cervical spine was performed following the standard protocol without intravenous contrast. Multiplanar CT image reconstructions of the cervical spine were also generated. RADIATION DOSE REDUCTION: This exam was performed according to the departmental dose-optimization program which includes automated exposure control, adjustment of the mA and/or kV according to patient size and/or use of iterative reconstruction technique. COMPARISON:  Chest x-ray 08/13/2023 FINDINGS: CT HEAD FINDINGS Brain: Cerebral ventricle sizes are concordant with the degree of cerebral volume loss. Patchy and confluent areas of decreased attenuation are noted throughout the deep and periventricular white matter of the cerebral hemispheres bilaterally, compatible with chronic microvascular ischemic disease. No evidence of large-territorial acute infarction. No parenchymal hemorrhage. No mass lesion. No extra-axial collection. No mass effect or midline shift. No hydrocephalus. Basilar cisterns are patent. Vascular: No hyperdense vessel. Atherosclerotic calcifications are present within the cavernous internal carotid and vertebral arteries. Skull: No acute fracture or focal lesion. Sinuses/Orbits: Paranasal sinuses and mastoid air cells are clear. Bilateral lens replacement. Otherwise the orbits are unremarkable. Other: 7 mm right scalp hematoma. CT CERVICAL SPINE FINDINGS Alignment: Normal.  Skull base and vertebrae: Multilevel moderate degenerative change of the spine. Associated moderate to severe bilateral C3-C4 C5-C6 osseous neural foraminal stenosis. No severe osseous central canal stenosis. No acute fracture. No aggressive appearing focal osseous lesion or focal pathologic process. Soft tissues and spinal canal: No prevertebral fluid or swelling. No visible canal hematoma. Upper chest: Small volume right pleural effusion. Other: Atherosclerotic plaque of the carotid arteries within the neck. IMPRESSION: 1. No acute intracranial abnormality. 2. No acute displaced fracture or traumatic listhesis of the cervical spine. 3. Small volume right pleural effusion. Electronically Signed   By: Tish Frederickson M.D.   On: 08/13/2023 17:28   DG Pelvis Portable  Result Date: 08/13/2023 CLINICAL DATA:  Pain after trauma EXAM: PORTABLE PELVIS 1 VIEWS COMPARISON:  None Available. FINDINGS: Hyperostosis. Slight joint space loss of the right hip compared to left. Mild degenerative changes of the sacroiliac joints. Hypertrophic changes of the pubic symphysis. No fracture or dislocation. Prominent degenerative changes along the lumbar spine. Vascular calcifications. Osteopenia. With this level of osteopenia subtle nondisplaced injury is difficult to completely exclude and if needed additional cross-sectional imaging as clinically directed such as CT for further sensitivity. IMPRESSION: Mild degenerative changes.  Hyperostosis.  Osteopenia Electronically Signed   By: Karen Kays M.D.   On: 08/13/2023 17:25   DG Chest Port 1 View  Result Date: 08/13/2023 CLINICAL DATA:  Pain after trauma.  Larey Seat out of a car. EXAM: PORTABLE CHEST 1 VIEW COMPARISON:  X-ray 08/01/2023.  Older exams as well FINDINGS: Enlarged cardiopericardial silhouette with calcified aorta. Left upper chest pacemaker. Small right effusion. Linear opacity at the bases. No pneumothorax. Developing interstitial changes. If there is further concern of  the sequela of trauma, contrast CT could be considered as clinically appropriate for further sensitivity of injury IMPRESSION: Enlarged heart. Pacemaker. New bilateral interstitial changes. Recommend follow up Small right pleural effusion.  Basilar opacities. Electronically Signed   By: Scarlette Shorts  Chales Abrahams M.D.   On: 08/13/2023 17:24   DG Chest 2 View  Result Date: 08/08/2023 CLINICAL DATA:  Increasing shortness of breath. Swelling in both feet. EXAM: CHEST - 2 VIEW COMPARISON:  09/17/2018 FINDINGS: New densities in the right lower chest with some elevation of the right hemidiaphragm. Findings are concerning for a small right pleural effusion with atelectasis or densities in the right lower lobe. Heart size appears to be enlarged. Patient is slightly rotated towards the right on the frontal view. Again noted is a left chest single lead cardiac pacemaker. Left lung appears to be clear. Atherosclerotic calcifications involving the aorta. IMPRESSION: 1. New densities in the right lower chest. Findings are suggestive for at least a small right pleural effusion with atelectasis or airspace disease in the right lower chest. Infection cannot be excluded. 2. Cardiomegaly. Electronically Signed   By: Richarda Overlie M.D.   On: 08/08/2023 09:31     The results of significant diagnostics from this hospitalization (including imaging, microbiology, ancillary and laboratory) are listed below for reference.     Microbiology: No results found for this or any previous visit (from the past 240 hour(s)).   Labs: BNP (last 3 results) Recent Labs    08/13/23 1644  BNP 503.5*   Basic Metabolic Panel: Recent Labs  Lab 08/15/23 0303 08/16/23 0303 08/16/23 1008 08/17/23 0240 08/18/23 0232 08/19/23 0219  NA 142 139 138 137 137 138  K 4.3 4.2 4.1 4.3 4.7 4.1  CL 107 104 102 102 101 105  CO2 29 29 30 28 28 25   GLUCOSE 103* 119* 104* 107* 104* 103*  BUN 25* 28* 28* 31* 33* 36*  CREATININE 1.71* 1.87* 1.88* 1.81* 1.83*  1.82*  CALCIUM 8.9 8.5* 8.4* 8.5* 8.7* 7.9*  MG 1.8 1.8  --  1.9 1.9 1.7  PHOS  --  4.0  --   --   --   --    Liver Function Tests: No results for input(s): "AST", "ALT", "ALKPHOS", "BILITOT", "PROT", "ALBUMIN" in the last 168 hours. No results for input(s): "LIPASE", "AMYLASE" in the last 168 hours. No results for input(s): "AMMONIA" in the last 168 hours. CBC: Recent Labs  Lab 08/16/23 0303 08/17/23 0240 08/18/23 0232 08/19/23 0219  WBC 6.1 6.2 8.1 6.5  HGB 9.7* 9.1* 9.6* 8.6*  HCT 32.4* 31.3* 33.6* 29.4*  MCV 99.1 99.4 100.9* 101.4*  PLT 113* 94* 107* 92*   Cardiac Enzymes: No results for input(s): "CKTOTAL", "CKMB", "CKMBINDEX", "TROPONINI" in the last 168 hours. BNP: Invalid input(s): "POCBNP" CBG: No results for input(s): "GLUCAP" in the last 168 hours. D-Dimer No results for input(s): "DDIMER" in the last 72 hours. Hgb A1c No results for input(s): "HGBA1C" in the last 72 hours. Lipid Profile No results for input(s): "CHOL", "HDL", "LDLCALC", "TRIG", "CHOLHDL", "LDLDIRECT" in the last 72 hours. Thyroid function studies No results for input(s): "TSH", "T4TOTAL", "T3FREE", "THYROIDAB" in the last 72 hours.  Invalid input(s): "FREET3" Anemia work up No results for input(s): "VITAMINB12", "FOLATE", "FERRITIN", "TIBC", "IRON", "RETICCTPCT" in the last 72 hours. Urinalysis    Component Value Date/Time   COLORURINE STRAW (A) 08/13/2023 2031   APPEARANCEUR CLEAR 08/13/2023 2031   LABSPEC 1.005 08/13/2023 2031   PHURINE 5.0 08/13/2023 2031   GLUCOSEU NEGATIVE 08/13/2023 2031   HGBUR MODERATE (A) 08/13/2023 2031   BILIRUBINUR NEGATIVE 08/13/2023 2031   KETONESUR NEGATIVE 08/13/2023 2031   PROTEINUR NEGATIVE 08/13/2023 2031   NITRITE NEGATIVE 08/13/2023 2031   LEUKOCYTESUR TRACE (A) 08/13/2023 2031  Sepsis Labs Recent Labs  Lab 08/16/23 0303 08/17/23 0240 08/18/23 0232 08/19/23 0219  WBC 6.1 6.2 8.1 6.5   Microbiology No results found for this or any  previous visit (from the past 240 hour(s)).   Time coordinating discharge:  I have spent 35 minutes face to face with the patient and on the ward discussing the patients care, assessment, plan and disposition with other care givers. >50% of the time was devoted counseling the patient about the risks and benefits of treatment/Discharge disposition and coordinating care.   SIGNED:   Miguel Rota, MD  [facility] 08/21/2023, 12:12 PM   If 7PM-7AM, please contact night-coverage

## 2023-08-21 NOTE — Progress Notes (Signed)
Speech Language Pathology Treatment: Dysphagia  Patient Details Name: [name]. MRN: [id number] DOB: 08/05/1931 Today's Date: 08/21/2023 Time: 7829-5621 SLP Time Calculation (min) (ACUTE ONLY): 8 min  Assessment / Plan / Recommendation Clinical Impression  Pt presents with increased lethargy and limited ability to participate. Observed pt with one sip of thin liquids as pt's diet was recently liberalized per MD. Provided education regarding aspiration precautions including frequent and thorough oral care, sitting upright during and after eating/drinking, and cough hygiene. Recommend continuing current diet without further SLP f/u.   HPI HPI: [name] is a 89 yo male presenting to ED 12/1 with chronic progressive weakness after a witnessed fall. He sustained no LOC, CTH negative. Admitted with volume overload c/b likely over-diuresis causing AKI. PMH includes CAD, chronic thrombocytopenia, HLD, HTN, permanent A-fib on chronic antiocoagulation, s/p PPM, gout, GERD      SLP Plan  All goals met      Recommendations for follow up therapy are one component of a multi-disciplinary discharge planning process, led by the attending physician.  Recommendations may be updated based on patient status, additional functional criteria and insurance authorization.    Recommendations  Diet recommendations: Regular;Thin liquid Liquids provided via: Cup;Straw Medication Administration: Crushed with puree Supervision: Staff to assist with self feeding;Full supervision/cueing for compensatory strategies Compensations: Minimize environmental distractions;Slow rate;Small sips/bites Postural Changes and/or Swallow Maneuvers: Seated upright 90 degrees;Upright 30-60 min after meal                  Oral care QID;Staff/trained caregiver to provide oral care   Frequent or constant Supervision/Assistance Dysphagia, oropharyngeal phase (R13.12)     All goals met     Gwynneth Aliment, M.A.,  CF-SLP Speech Language Pathology, Acute Rehabilitation Services  Secure Chat preferred [phone]   08/21/2023, 10:28 AM

## 2023-08-21 NOTE — Progress Notes (Signed)
Heart Failure Navigator Progress Note  Assessed for Heart & Vascular TOC clinic readiness.  Patient does not meet criteria due to . No TOC - transitioning to hospice at dc 40-45% EF   Navigator will sign off at this time.   Rhae Hammock, BSN, Scientist, clinical (histocompatibility and immunogenetics) Only

## 2023-08-22 ENCOUNTER — Telehealth: Payer: Self-pay

## 2023-09-13 NOTE — Transitions of Care (Post Inpatient/ED Visit) (Signed)
   09-20-23  Name: [name]. MRN: [id number] DOB: 1931/01/23  Today's TOC FU Call Status: Today's TOC FU Call Status:: Successful TOC FU Call Completed TOC FU Call Complete Date: September 20, 2023 Patient's Name and Date of Birth confirmed.  Transition Care Management Follow-up Telephone Call Date of Discharge: 08/21/23 Discharge Facility: [facility]) Type of Discharge: Inpatient Admission Primary Inpatient Discharge Diagnosis:: "fall in home" How have you been since you were released from the hospital?: Same (Daughter states pt doing ok-rested fairly well last night-she confirms hospice RN is in the home now completing assessment and enrollment. No RN CM needs or concerns at this time and call deferred accordingly.) Any questions or concerns?: No  Items Reviewed: Did you receive and understand the discharge instructions provided?: Yes Medications obtained,verified, and reconciled?: No Medications Not Reviewed Reasons:: Other: (hospice RN in the home to enroll in services and will review meds) Do you have support at home?: Yes People in Home: child(ren), adult Name of Support/Comfort Primary Source: [name]-daughter  Medications Reviewed Today: Medications Reviewed Today   Medications were not reviewed in this encounter     Home Care and Equipment/Supplies: Were Home Health Services Ordered?: Yes Name of Home Health Agency:: [facility] Has Agency set up a time to come to your home?: Yes First Home Health Visit Date: 2023/09/20 Any new equipment or medical supplies ordered?: Yes Name of Medical supply agency?: Hospice- oxygen & hospital bed Were you able to get the equipment/medical supplies?: Yes Do you have any questions related to the use of the equipment/supplies?: No    TOC interventions discussed/reviewed: -Provided Verbal Education: hospice, sx mgmt    Mark Fairy, RN,BSN,CCM RN Care Manager Transitions of Care  [place]  Direct Phone: [phone] Toll Free: [phone] Fax: [phone]

## 2023-09-13 DEATH — deceased

## 2023-11-21 ENCOUNTER — Encounter: Payer: Medicare Other | Admitting: Internal Medicine
